# Patient Record
Sex: Female | Born: 1940 | Race: White | Hispanic: No | Smoking: Never smoker
Health system: Southern US, Community
[De-identification: ages and names within clinical notes are randomized; demographics above are authoritative.]

## PROBLEM LIST (undated history)

## (undated) DIAGNOSIS — F338 Other recurrent depressive disorders: Secondary | ICD-10-CM

## (undated) DIAGNOSIS — K589 Irritable bowel syndrome without diarrhea: Secondary | ICD-10-CM

## (undated) DIAGNOSIS — C801 Malignant (primary) neoplasm, unspecified: Secondary | ICD-10-CM

## (undated) DIAGNOSIS — I4891 Unspecified atrial fibrillation: Secondary | ICD-10-CM

## (undated) DIAGNOSIS — J8410 Pulmonary fibrosis, unspecified: Secondary | ICD-10-CM

## (undated) DIAGNOSIS — K219 Gastro-esophageal reflux disease without esophagitis: Secondary | ICD-10-CM

## (undated) DIAGNOSIS — E119 Type 2 diabetes mellitus without complications: Secondary | ICD-10-CM

## (undated) DIAGNOSIS — K529 Noninfective gastroenteritis and colitis, unspecified: Secondary | ICD-10-CM

## (undated) DIAGNOSIS — J849 Interstitial pulmonary disease, unspecified: Secondary | ICD-10-CM

## (undated) DIAGNOSIS — K5792 Diverticulitis of intestine, part unspecified, without perforation or abscess without bleeding: Secondary | ICD-10-CM

## (undated) DIAGNOSIS — I272 Pulmonary hypertension, unspecified: Secondary | ICD-10-CM

## (undated) DIAGNOSIS — IMO0001 Reserved for inherently not codable concepts without codable children: Secondary | ICD-10-CM

## (undated) DIAGNOSIS — I10 Essential (primary) hypertension: Secondary | ICD-10-CM

## (undated) DIAGNOSIS — S72009A Fracture of unspecified part of neck of unspecified femur, initial encounter for closed fracture: Secondary | ICD-10-CM

## (undated) HISTORY — PX: TONSILLECTOMY: SUR1361

## (undated) HISTORY — PX: COLONOSCOPY: SHX174

## (undated) HISTORY — PX: BREAST BIOPSY: SHX20

## (undated) HISTORY — PX: APPENDECTOMY: SHX54

## (undated) HISTORY — PX: ABDOMINAL HYSTERECTOMY: SHX81

## (undated) HISTORY — DX: Type 2 diabetes mellitus without complications: E11.9

---

## 1998-10-01 HISTORY — PX: NISSEN FUNDOPLICATION: SHX2091

## 2011-08-14 DIAGNOSIS — K5790 Diverticulosis of intestine, part unspecified, without perforation or abscess without bleeding: Secondary | ICD-10-CM | POA: Insufficient documentation

## 2012-02-01 DIAGNOSIS — K219 Gastro-esophageal reflux disease without esophagitis: Secondary | ICD-10-CM | POA: Insufficient documentation

## 2012-05-19 DIAGNOSIS — M7070 Other bursitis of hip, unspecified hip: Secondary | ICD-10-CM | POA: Insufficient documentation

## 2012-09-02 DIAGNOSIS — G4733 Obstructive sleep apnea (adult) (pediatric): Secondary | ICD-10-CM | POA: Insufficient documentation

## 2013-01-05 DIAGNOSIS — R682 Dry mouth, unspecified: Secondary | ICD-10-CM | POA: Insufficient documentation

## 2013-01-05 DIAGNOSIS — G4700 Insomnia, unspecified: Secondary | ICD-10-CM | POA: Insufficient documentation

## 2014-04-08 DIAGNOSIS — I6782 Cerebral ischemia: Secondary | ICD-10-CM | POA: Insufficient documentation

## 2014-04-08 DIAGNOSIS — G939 Disorder of brain, unspecified: Secondary | ICD-10-CM | POA: Insufficient documentation

## 2014-11-04 DIAGNOSIS — E119 Type 2 diabetes mellitus without complications: Secondary | ICD-10-CM

## 2014-11-04 DIAGNOSIS — E782 Mixed hyperlipidemia: Secondary | ICD-10-CM | POA: Insufficient documentation

## 2014-11-04 DIAGNOSIS — I10 Essential (primary) hypertension: Secondary | ICD-10-CM | POA: Insufficient documentation

## 2014-11-04 DIAGNOSIS — I152 Hypertension secondary to endocrine disorders: Secondary | ICD-10-CM | POA: Insufficient documentation

## 2014-11-04 HISTORY — DX: Type 2 diabetes mellitus without complications: E11.9

## 2015-01-10 DIAGNOSIS — D509 Iron deficiency anemia, unspecified: Secondary | ICD-10-CM | POA: Insufficient documentation

## 2015-01-11 DIAGNOSIS — E039 Hypothyroidism, unspecified: Secondary | ICD-10-CM | POA: Insufficient documentation

## 2015-03-02 ENCOUNTER — Other Ambulatory Visit: Payer: Self-pay | Admitting: Physician Assistant

## 2015-03-02 DIAGNOSIS — R109 Unspecified abdominal pain: Secondary | ICD-10-CM

## 2015-03-03 ENCOUNTER — Other Ambulatory Visit: Payer: Self-pay | Admitting: Physician Assistant

## 2015-03-03 DIAGNOSIS — R0602 Shortness of breath: Secondary | ICD-10-CM

## 2015-03-04 ENCOUNTER — Ambulatory Visit
Admission: RE | Admit: 2015-03-04 | Discharge: 2015-03-04 | Disposition: A | Discharge status: Home / Self Care | Payer: PPO | Source: Ambulatory Visit | Attending: Physician Assistant | Admitting: Physician Assistant

## 2015-03-04 DIAGNOSIS — K589 Irritable bowel syndrome without diarrhea: Secondary | ICD-10-CM | POA: Diagnosis not present

## 2015-03-04 DIAGNOSIS — M1611 Unilateral primary osteoarthritis, right hip: Secondary | ICD-10-CM | POA: Diagnosis not present

## 2015-03-04 DIAGNOSIS — K5730 Diverticulosis of large intestine without perforation or abscess without bleeding: Secondary | ICD-10-CM | POA: Insufficient documentation

## 2015-03-04 DIAGNOSIS — J8410 Pulmonary fibrosis, unspecified: Secondary | ICD-10-CM | POA: Diagnosis not present

## 2015-03-04 DIAGNOSIS — R109 Unspecified abdominal pain: Secondary | ICD-10-CM | POA: Diagnosis not present

## 2015-03-04 DIAGNOSIS — R0602 Shortness of breath: Secondary | ICD-10-CM

## 2015-03-04 DIAGNOSIS — M4854XA Collapsed vertebra, not elsewhere classified, thoracic region, initial encounter for fracture: Secondary | ICD-10-CM | POA: Insufficient documentation

## 2015-03-04 DIAGNOSIS — I2510 Atherosclerotic heart disease of native coronary artery without angina pectoris: Secondary | ICD-10-CM | POA: Insufficient documentation

## 2015-03-04 HISTORY — DX: Irritable bowel syndrome, unspecified: K58.9

## 2015-03-04 HISTORY — DX: Essential (primary) hypertension: I10

## 2015-03-04 MED ORDER — IOHEXOL 300 MG/ML  SOLN
100.0000 mL | Freq: Once | INTRAMUSCULAR | Status: AC | PRN
  Administered 2015-03-04: 100 mL via INTRAVENOUS

## 2015-03-10 ENCOUNTER — Encounter: Payer: Self-pay | Admitting: *Deleted

## 2015-03-15 NOTE — Discharge Instructions (Signed)
Cataract Surgery Care After Refer to this sheet in the next few weeks. These instructions provide you with information on caring for yourself after your procedure. Your caregiver may also give you more specific instructions. Your treatment has been planned according to current medical practices, but problems sometimes occur. Call your caregiver if you have any problems or questions after your procedure.  HOME CARE INSTRUCTIONS   Avoid strenuous activities as directed by your caregiver.  Ask your caregiver when you can resume driving.  Use eyedrops or other medicines to help healing and control pressure inside your eye as directed by your caregiver.  Only take over-the-counter or prescription medicines for pain, discomfort, or fever as directed by your caregiver.  Do not to touch or rub your eyes.  You may be instructed to use a protective shield during the first few days and nights after surgery. If not, wear sunglasses to protect your eyes. This is to protect the eye from pressure or from being accidentally bumped.  Keep the area around your eye clean and dry. Avoid swimming or allowing water to hit you directly in the face while showering. Keep soap and shampoo out of your eyes.  Do not bend or lift heavy objects. Bending increases pressure in the eye. You can walk, climb stairs, and do light household chores.  Do not put a contact lens into the eye that had surgery until your caregiver says it is okay to do so.  Ask your doctor when you can return to work. This will depend on the kind of work that you do. If you work in a dusty environment, you may be advised to wear protective eyewear for a period of time.  Ask your caregiver when it will be safe to engage in sexual activity.  Continue with your regular eye exams as directed by your caregiver. What to expect:  It is normal to feel itching and mild discomfort for a few days after cataract surgery. Some fluid discharge is also common,  and your eye may be sensitive to light and touch.  After 1 to 2 days, even moderate discomfort should disappear. In most cases, healing will take about 6 weeks.  If you received an intraocular lens (IOL), you may notice that colors are very bright or have a blue tinge. Also, if you have been in bright sunlight, everything may appear reddish for a few hours. If you see these color tinges, it is because your lens is clear and no longer cloudy. Within a few months after receiving an IOL, these extra colors should go away. When you have healed, you will probably need new glasses. SEEK MEDICAL CARE IF:   You have increased bruising around your eye.  You have discomfort not helped by medicine. SEEK IMMEDIATE MEDICAL CARE IF:   You have a fever.  You have a worsening or sudden vision loss.  You have redness, swelling, or increasing pain in the eye.  You have a thick discharge from the eye that had surgery. MAKE SURE YOU:  Understand these instructions.  Will watch your condition.  Will get help right away if you are not doing well or get worse. Document Released: 04/06/2005 Document Revised: 12/10/2011 Document Reviewed: 05/11/2011 [facility] Patient Information 2015 [place], [place]. This information is not intended to replace advice given to you by your health care provider. Make sure you discuss any questions you have with your health care provider. General Anesthesia, Care After Refer to this sheet in the next few weeks.  These instructions provide you with information on caring for yourself after your procedure. Your health care provider may also give you more specific instructions. Your treatment has been planned according to current medical practices, but problems sometimes occur. Call your health care provider if you have any problems or questions after your procedure. WHAT TO EXPECT AFTER THE PROCEDURE After the procedure, it is typical to experience:  Sleepiness.  Nausea and  vomiting. HOME CARE INSTRUCTIONS  For the first 24 hours after general anesthesia:  Have a responsible person with you.  Do not drive a car. If you are alone, do not take public transportation.  Do not drink alcohol.  Do not take medicine that has not been prescribed by your health care provider.  Do not sign important papers or make important decisions.  You may resume a normal diet and activities as directed by your health care provider.  Change bandages (dressings) as directed.  If you have questions or problems that seem related to general anesthesia, call the hospital and ask for the anesthetist or anesthesiologist on call. SEEK MEDICAL CARE IF:  You have nausea and vomiting that continue the day after anesthesia.  You develop a rash. SEEK IMMEDIATE MEDICAL CARE IF:   You have difficulty breathing.  You have chest pain.  You have any allergic problems. Document Released: 12/24/2000 Document Revised: 09/22/2013 Document Reviewed: 04/02/2013 [facility] Patient Information 2015 [place], [place]. This information is not intended to replace advice given to you by your health care provider. Make sure you discuss any questions you have with your health care provider.

## 2015-03-16 ENCOUNTER — Encounter
Admission: RE | Disposition: A | Discharge status: Home / Self Care | Payer: Self-pay | Source: Ambulatory Visit | Attending: Ophthalmology

## 2015-03-16 ENCOUNTER — Ambulatory Visit
Admission: RE | Admit: 2015-03-16 | Discharge: 2015-03-16 | Disposition: A | Discharge status: Home / Self Care | Payer: PPO | Source: Ambulatory Visit | Attending: Ophthalmology | Admitting: Ophthalmology

## 2015-03-16 ENCOUNTER — Ambulatory Visit: Payer: PPO | Admitting: Student in an Organized Health Care Education/Training Program

## 2015-03-16 DIAGNOSIS — E079 Disorder of thyroid, unspecified: Secondary | ICD-10-CM | POA: Diagnosis not present

## 2015-03-16 DIAGNOSIS — K219 Gastro-esophageal reflux disease without esophagitis: Secondary | ICD-10-CM | POA: Insufficient documentation

## 2015-03-16 DIAGNOSIS — Z888 Allergy status to other drugs, medicaments and biological substances status: Secondary | ICD-10-CM | POA: Diagnosis not present

## 2015-03-16 DIAGNOSIS — J40 Bronchitis, not specified as acute or chronic: Secondary | ICD-10-CM | POA: Diagnosis not present

## 2015-03-16 DIAGNOSIS — H2511 Age-related nuclear cataract, right eye: Secondary | ICD-10-CM | POA: Insufficient documentation

## 2015-03-16 DIAGNOSIS — R05 Cough: Secondary | ICD-10-CM | POA: Diagnosis not present

## 2015-03-16 DIAGNOSIS — R062 Wheezing: Secondary | ICD-10-CM | POA: Insufficient documentation

## 2015-03-16 DIAGNOSIS — Z90710 Acquired absence of both cervix and uterus: Secondary | ICD-10-CM | POA: Insufficient documentation

## 2015-03-16 DIAGNOSIS — H9190 Unspecified hearing loss, unspecified ear: Secondary | ICD-10-CM | POA: Diagnosis not present

## 2015-03-16 DIAGNOSIS — M810 Age-related osteoporosis without current pathological fracture: Secondary | ICD-10-CM | POA: Diagnosis not present

## 2015-03-16 DIAGNOSIS — I499 Cardiac arrhythmia, unspecified: Secondary | ICD-10-CM | POA: Insufficient documentation

## 2015-03-16 DIAGNOSIS — D649 Anemia, unspecified: Secondary | ICD-10-CM | POA: Diagnosis not present

## 2015-03-16 DIAGNOSIS — K5790 Diverticulosis of intestine, part unspecified, without perforation or abscess without bleeding: Secondary | ICD-10-CM | POA: Insufficient documentation

## 2015-03-16 DIAGNOSIS — R002 Palpitations: Secondary | ICD-10-CM | POA: Diagnosis not present

## 2015-03-16 DIAGNOSIS — F329 Major depressive disorder, single episode, unspecified: Secondary | ICD-10-CM | POA: Diagnosis not present

## 2015-03-16 HISTORY — DX: Reserved for inherently not codable concepts without codable children: IMO0001

## 2015-03-16 HISTORY — DX: Interstitial pulmonary disease, unspecified: J84.9

## 2015-03-16 HISTORY — PX: CATARACT EXTRACTION W/PHACO: SHX586

## 2015-03-16 HISTORY — DX: Gastro-esophageal reflux disease without esophagitis: K21.9

## 2015-03-16 HISTORY — DX: Other recurrent depressive disorders: F33.8

## 2015-03-16 LAB — GLUCOSE, CAPILLARY: Glucose-Capillary: 100 mg/dL — ABNORMAL HIGH (ref 65–99)

## 2015-03-16 SURGERY — PHACOEMULSIFICATION, CATARACT, WITH IOL INSERTION
Anesthesia: Monitor Anesthesia Care | Laterality: Right | Wound class: Clean

## 2015-03-16 MED ORDER — BRIMONIDINE TARTRATE 0.2 % OP SOLN
OPHTHALMIC | Status: DC | PRN
  Administered 2015-03-16: 1 [drp] via OPHTHALMIC

## 2015-03-16 MED ORDER — POVIDONE-IODINE 5 % OP SOLN
1.0000 "application " | OPHTHALMIC | Status: DC | PRN
  Administered 2015-03-16: 1 via OPHTHALMIC

## 2015-03-16 MED ORDER — FENTANYL CITRATE (PF) 100 MCG/2ML IJ SOLN
INTRAMUSCULAR | Status: DC | PRN
  Administered 2015-03-16: 50 ug via INTRAVENOUS

## 2015-03-16 MED ORDER — ARMC OPHTHALMIC DILATING GEL
1.0000 "application " | OPHTHALMIC | Status: DC | PRN
  Administered 2015-03-16 (×2): 1 via OPHTHALMIC

## 2015-03-16 MED ORDER — TIMOLOL MALEATE 0.5 % OP SOLN
OPHTHALMIC | Status: DC | PRN
  Administered 2015-03-16: 1 [drp] via OPHTHALMIC

## 2015-03-16 MED ORDER — TETRACAINE HCL 0.5 % OP SOLN
1.0000 [drp] | OPHTHALMIC | Status: DC | PRN
  Administered 2015-03-16: 1 [drp] via OPHTHALMIC

## 2015-03-16 MED ORDER — BSS IO SOLN
INTRAOCULAR | Status: DC | PRN
  Administered 2015-03-16: 61 mL via OPHTHALMIC

## 2015-03-16 MED ORDER — CEFUROXIME OPHTHALMIC INJECTION 1 MG/0.1 ML
INJECTION | OPHTHALMIC | Status: DC | PRN
  Administered 2015-03-16: 0.1 mL via OPHTHALMIC

## 2015-03-16 MED ORDER — NA HYALUR & NA CHOND-NA HYALUR 0.4-0.35 ML IO KIT
PACK | INTRAOCULAR | Status: DC | PRN
  Administered 2015-03-16: 1 mL via INTRAOCULAR

## 2015-03-16 SURGICAL SUPPLY — 26 items
CANNULA ANT/CHMB 27GA (MISCELLANEOUS) ×3 IMPLANT
GLOVE SURG LX 7.5 STRW (GLOVE) ×2
GLOVE SURG LX STRL 7.5 STRW (GLOVE) ×1 IMPLANT
GLOVE SURG TRIUMPH 8.0 PF LTX (GLOVE) ×3 IMPLANT
GOWN STRL REUS W/ TWL LRG LVL3 (GOWN DISPOSABLE) ×2 IMPLANT
GOWN STRL REUS W/TWL LRG LVL3 (GOWN DISPOSABLE) ×4
LENS IOL TECNIS 22.5 (Intraocular Lens) ×3 IMPLANT
LENS IOL TECNIS MONO 1P 22.5 (Intraocular Lens) ×1 IMPLANT
MARKER SKIN SURG W/RULER VIO (MISCELLANEOUS) ×3 IMPLANT
NDL RETROBULBAR .5 NSTRL (NEEDLE) IMPLANT
NEEDLE FILTER BLUNT 18X 1/2SAF (NEEDLE) ×2
NEEDLE FILTER BLUNT 18X1 1/2 (NEEDLE) ×1 IMPLANT
PACK CATARACT BRASINGTON (MISCELLANEOUS) ×3 IMPLANT
PACK EYE AFTER SURG (MISCELLANEOUS) ×3 IMPLANT
PACK OPTHALMIC (MISCELLANEOUS) ×3 IMPLANT
RING MALYGIN 7.0 (MISCELLANEOUS) IMPLANT
SUT ETHILON 10-0 CS-B-6CS-B-6 (SUTURE)
SUT VICRYL  9 0 (SUTURE)
SUT VICRYL 9 0 (SUTURE) IMPLANT
SUTURE EHLN 10-0 CS-B-6CS-B-6 (SUTURE) IMPLANT
SYR 3ML LL SCALE MARK (SYRINGE) ×3 IMPLANT
SYR 5ML LL (SYRINGE) IMPLANT
SYR TB 1ML LUER SLIP (SYRINGE) ×3 IMPLANT
WATER STERILE IRR 250ML POUR (IV SOLUTION) ×3 IMPLANT
WATER STERILE IRR 500ML POUR (IV SOLUTION) IMPLANT
WIPE NON LINTING 3.25X3.25 (MISCELLANEOUS) ×3 IMPLANT

## 2015-03-16 NOTE — H&P (Signed)
  The History and Physical notes were scanned in.  The patient remains stable and unchanged from the H&P.   Previous H&P reviewed, patient examined, and there are no changes.  Julie Mcmillan 03/16/2015 10:14 AM

## 2015-03-16 NOTE — Anesthesia Postprocedure Evaluation (Signed)
  Anesthesia Post-op Note  Patient: Julie Mcmillan  Procedure(s) Performed: Procedure(s): CATARACT EXTRACTION PHACO AND INTRAOCULAR LENS PLACEMENT (IOC) (Right)  Anesthesia type:MAC  Patient location: PACU  Post pain: Pain level controlled  Post assessment: Post-op Vital signs reviewed, Patient's Cardiovascular Status Stable, Respiratory Function Stable, Patent Airway and No signs of Nausea or vomiting  Post vital signs: Reviewed and stable  Last Vitals:  Filed Vitals:   03/16/15 1106  BP: 139/68  Pulse: 78  Temp: 36.4 C  Resp: 15    Level of consciousness: awake, alert  and patient cooperative  Complications: No apparent anesthesia complications

## 2015-03-16 NOTE — Anesthesia Procedure Notes (Signed)
Procedure Name: MAC Performed by: Londell Moh Pre-anesthesia Checklist: Patient identified, Emergency Drugs available, Suction available, Timeout performed and Patient being monitored Patient Re-evaluated:Patient Re-evaluated prior to inductionOxygen Delivery Method: Nasal cannula Placement Confirmation: positive ETCO2

## 2015-03-16 NOTE — Op Note (Signed)
LOCATION:  [facility]   PREOPERATIVE DIAGNOSIS:    Nuclear sclerotic cataract right eye. H25.11   POSTOPERATIVE DIAGNOSIS:  Nuclear sclerotic cataract right eye.     PROCEDURE:  Phacoemusification with posterior chamber intraocular lens placement of the right eye   LENS:   Implant Name Type Inv. Item Serial No. Manufacturer Lot No. LRB No. Used  LENS IMPL INTRAOC [id number] 22.5 - [id number] Intraocular Lens LENS IMPL INTRAOC ZCB00 22.5 [id number]   Right 1        ULTRASOUND TIME: 16 % of 1 minutes, 8 seconds.  CDE 11.2   SURGEON:  Wyonia Hough, MD   ANESTHESIA:  Topical with tetracaine drops and 2% Xylocaine jelly.   COMPLICATIONS:  None.   DESCRIPTION OF PROCEDURE:  The patient was identified in the holding room and transported to the operating room and placed in the supine position under the operating microscope.  The right eye was identified as the operative eye and it was prepped and draped in the usual sterile ophthalmic fashion.   A 1 millimeter clear-corneal paracentesis was made at the 12:00 position.  The anterior chamber was filled with Viscoat viscoelastic.  A 2.4 millimeter keratome was used to make a near-clear corneal incision at the 9:00 position.  A curvilinear capsulorrhexis was made with a cystotome and capsulorrhexis forceps.  Balanced salt solution was used to hydrodissect and hydrodelineate the nucleus.   Phacoemulsification was then used in stop and chop fashion to remove the lens nucleus and epinucleus.  The remaining cortex was then removed using the irrigation and aspiration handpiece. Provisc was then placed into the capsular bag to distend it for lens placement.  A lens was then injected into the capsular bag.  The remaining viscoelastic was aspirated.   Wounds were hydrated with balanced salt solution.  The anterior chamber was inflated to a physiologic pressure with balanced salt solution.  No wound leaks were noted. Cefuroxime 0.1 ml of a  38m/ml solution was injected into the anterior chamber for a dose of 1 mg of intracameral antibiotic at the completion of the case.   Timolol and Brimonidine drops were applied to the eye.  The patient was taken to the recovery room in stable condition without complications of anesthesia or surgery.   Monetta Lick 03/16/2015, 11:03 AM

## 2015-03-16 NOTE — Anesthesia Preprocedure Evaluation (Signed)
Anesthesia Evaluation  Patient identified by MRN, date of birth, ID band Patient awake    Reviewed: Allergy & Precautions, NPO status , Patient's Chart, lab work & pertinent test results  Airway Mallampati: III  TM Distance: >3 FB Neck ROM: Full    Dental no notable dental hx.    Pulmonary neg pulmonary ROS,  Interstitial lung disease breath sounds clear to auscultation  Pulmonary exam normal       Cardiovascular METS: 3 - Mets hypertension, negative cardio ROS Normal cardiovascular examRhythm:Regular Rate:Normal  H/o arrhythmia.  No current symptoms.  Pt can lie flat   Neuro/Psych negative neurological ROS  negative psych ROS   GI/Hepatic negative GI ROS, Neg liver ROS, GERD-  ,  Endo/Other  negative endocrine ROS  Renal/GU negative Renal ROS  negative genitourinary   Musculoskeletal negative musculoskeletal ROS (+)   Abdominal   Peds negative pediatric ROS (+)  Hematology negative hematology ROS (+)   Anesthesia Other Findings   Reproductive/Obstetrics negative OB ROS                             Anesthesia Physical Anesthesia Plan  ASA: II  Anesthesia Plan: MAC   Post-op Pain Management:    Induction: Intravenous  Airway Management Planned:   Additional Equipment:   Intra-op Plan:   Post-operative Plan: Extubation in OR  Informed Consent: I have reviewed the patients History and Physical, chart, labs and discussed the procedure including the risks, benefits and alternatives for the proposed anesthesia with the patient or authorized representative who has indicated his/her understanding and acceptance.   Dental advisory given  Plan Discussed with: CRNA  Anesthesia Plan Comments:         Anesthesia Quick Evaluation

## 2015-03-16 NOTE — Transfer of Care (Signed)
Immediate Anesthesia Transfer of Care Note  Patient: Julie Mcmillan  Procedure(s) Performed: Procedure(s): CATARACT EXTRACTION PHACO AND INTRAOCULAR LENS PLACEMENT (IOC) (Right)  Patient Location: PACU  Anesthesia Type: MAC  Level of Consciousness: awake, alert  and patient cooperative  Airway and Oxygen Therapy: Patient Spontanous Breathing and Patient connected to supplemental oxygen  Post-op Assessment: Post-op Vital signs reviewed, Patient's Cardiovascular Status Stable, Respiratory Function Stable, Patent Airway and No signs of Nausea or vomiting  Post-op Vital Signs: Reviewed and stable  Complications: No apparent anesthesia complications

## 2015-03-17 ENCOUNTER — Encounter: Payer: Self-pay | Admitting: Ophthalmology

## 2015-03-25 ENCOUNTER — Observation Stay
Admission: EM | Admit: 2015-03-25 | Discharge: 2015-03-29 | Disposition: A | Discharge status: Home / Self Care | Payer: PPO | Attending: Internal Medicine | Admitting: Internal Medicine

## 2015-03-25 ENCOUNTER — Emergency Department: Payer: PPO

## 2015-03-25 DIAGNOSIS — Z833 Family history of diabetes mellitus: Secondary | ICD-10-CM | POA: Diagnosis not present

## 2015-03-25 DIAGNOSIS — R197 Diarrhea, unspecified: Secondary | ICD-10-CM | POA: Insufficient documentation

## 2015-03-25 DIAGNOSIS — E119 Type 2 diabetes mellitus without complications: Secondary | ICD-10-CM | POA: Insufficient documentation

## 2015-03-25 DIAGNOSIS — K5730 Diverticulosis of large intestine without perforation or abscess without bleeding: Secondary | ICD-10-CM | POA: Diagnosis not present

## 2015-03-25 DIAGNOSIS — Z8249 Family history of ischemic heart disease and other diseases of the circulatory system: Secondary | ICD-10-CM | POA: Insufficient documentation

## 2015-03-25 DIAGNOSIS — K219 Gastro-esophageal reflux disease without esophagitis: Secondary | ICD-10-CM | POA: Diagnosis not present

## 2015-03-25 DIAGNOSIS — F419 Anxiety disorder, unspecified: Secondary | ICD-10-CM | POA: Insufficient documentation

## 2015-03-25 DIAGNOSIS — Z7902 Long term (current) use of antithrombotics/antiplatelets: Secondary | ICD-10-CM | POA: Insufficient documentation

## 2015-03-25 DIAGNOSIS — I480 Paroxysmal atrial fibrillation: Secondary | ICD-10-CM | POA: Insufficient documentation

## 2015-03-25 DIAGNOSIS — I272 Other secondary pulmonary hypertension: Secondary | ICD-10-CM | POA: Insufficient documentation

## 2015-03-25 DIAGNOSIS — Z23 Encounter for immunization: Secondary | ICD-10-CM | POA: Insufficient documentation

## 2015-03-25 DIAGNOSIS — K921 Melena: Secondary | ICD-10-CM | POA: Insufficient documentation

## 2015-03-25 DIAGNOSIS — J849 Interstitial pulmonary disease, unspecified: Secondary | ICD-10-CM | POA: Insufficient documentation

## 2015-03-25 DIAGNOSIS — Z79899 Other long term (current) drug therapy: Secondary | ICD-10-CM | POA: Insufficient documentation

## 2015-03-25 DIAGNOSIS — I10 Essential (primary) hypertension: Secondary | ICD-10-CM | POA: Diagnosis not present

## 2015-03-25 DIAGNOSIS — Z803 Family history of malignant neoplasm of breast: Secondary | ICD-10-CM | POA: Insufficient documentation

## 2015-03-25 DIAGNOSIS — E039 Hypothyroidism, unspecified: Secondary | ICD-10-CM | POA: Insufficient documentation

## 2015-03-25 DIAGNOSIS — M810 Age-related osteoporosis without current pathological fracture: Secondary | ICD-10-CM | POA: Insufficient documentation

## 2015-03-25 DIAGNOSIS — J189 Pneumonia, unspecified organism: Secondary | ICD-10-CM | POA: Diagnosis present

## 2015-03-25 DIAGNOSIS — M1990 Unspecified osteoarthritis, unspecified site: Secondary | ICD-10-CM | POA: Diagnosis not present

## 2015-03-25 DIAGNOSIS — I4891 Unspecified atrial fibrillation: Secondary | ICD-10-CM | POA: Diagnosis present

## 2015-03-25 DIAGNOSIS — Z86010 Personal history of colonic polyps: Secondary | ICD-10-CM | POA: Diagnosis not present

## 2015-03-25 LAB — CBC
HCT: 40 % (ref 35.0–47.0)
HEMOGLOBIN: 13 g/dL (ref 12.0–16.0)
MCH: 27 pg (ref 26.0–34.0)
MCHC: 32.4 g/dL (ref 32.0–36.0)
MCV: 83.2 fL (ref 80.0–100.0)
Platelets: 407 10*3/uL (ref 150–440)
RBC: 4.81 MIL/uL (ref 3.80–5.20)
RDW: 14.7 % — ABNORMAL HIGH (ref 11.5–14.5)
WBC: 17.5 10*3/uL — ABNORMAL HIGH (ref 3.6–11.0)

## 2015-03-25 LAB — PROTIME-INR
INR: 1.46
Prothrombin Time: 17.9 seconds — ABNORMAL HIGH (ref 11.4–15.0)

## 2015-03-25 LAB — TROPONIN I
Troponin I: <0.03 ng/mL (ref <0.031)
Troponin I: <0.03 ng/mL (ref <0.031)
Troponin I: <0.03 ng/mL (ref <0.031)

## 2015-03-25 LAB — COMPREHENSIVE METABOLIC PANEL
ALT: 15 U/L (ref 14–54)
AST: 24 U/L (ref 15–41)
Albumin: 4 g/dL (ref 3.5–5.0)
Alkaline Phosphatase: 88 U/L (ref 38–126)
Anion gap: 10 (ref 5–15)
BUN: 10 mg/dL (ref 6–20)
CALCIUM: 8.9 mg/dL (ref 8.9–10.3)
CO2: 23 mmol/L (ref 22–32)
CREATININE: 0.9 mg/dL (ref 0.44–1.00)
Chloride: 105 mmol/L (ref 101–111)
GFR calc Af Amer: >60 mL/min (ref >60)
Glucose, Bld: 119 mg/dL — ABNORMAL HIGH (ref 65–99)
Potassium: 3.7 mmol/L (ref 3.5–5.1)
Sodium: 138 mmol/L (ref 135–145)
Total Bilirubin: 0.5 mg/dL (ref 0.3–1.2)
Total Protein: 7.3 g/dL (ref 6.5–8.1)

## 2015-03-25 LAB — BRAIN NATRIURETIC PEPTIDE: B Natriuretic Peptide: 332 pg/mL — ABNORMAL HIGH (ref 0.0–100.0)

## 2015-03-25 LAB — APTT: aPTT: 37 seconds — ABNORMAL HIGH (ref 24–36)

## 2015-03-25 LAB — TSH: TSH: 6.046 u[IU]/mL — ABNORMAL HIGH (ref 0.350–4.500)

## 2015-03-25 MED ORDER — METFORMIN HCL 500 MG PO TABS
500.0000 mg | ORAL_TABLET | Freq: Two times a day (BID) | ORAL | Status: DC
  Administered 2015-03-26 – 2015-03-29 (×8): 500 mg via ORAL
  Filled 2015-03-25 (×8): qty 1

## 2015-03-25 MED ORDER — DILTIAZEM HCL 25 MG/5ML IV SOLN
10.0000 mg | Freq: Once | INTRAVENOUS | Status: DC
  Administered 2015-03-25: 10 mg via INTRAVENOUS

## 2015-03-25 MED ORDER — VENLAFAXINE HCL ER 37.5 MG PO CP24
37.5000 mg | ORAL_CAPSULE | Freq: Every day | ORAL | Status: DC
  Administered 2015-03-26 – 2015-03-29 (×4): 37.5 mg via ORAL
  Filled 2015-03-25 (×4): qty 1

## 2015-03-25 MED ORDER — VENLAFAXINE HCL 37.5 MG PO TABS: 100.0000 mg | ORAL_TABLET | Freq: Every day | ORAL | Status: DC

## 2015-03-25 MED ORDER — LEVOTHYROXINE SODIUM 150 MCG PO TABS
150.0000 ug | ORAL_TABLET | Freq: Every day | ORAL | Status: DC
Start: 2015-03-26 — End: 2015-03-29
  Administered 2015-03-26 – 2015-03-29 (×4): 150 ug via ORAL
  Filled 2015-03-25 (×4): qty 1

## 2015-03-25 MED ORDER — DILTIAZEM HCL 30 MG PO TABS
ORAL_TABLET | ORAL | Status: AC
  Administered 2015-03-25: 30 mg via ORAL
  Filled 2015-03-25: qty 1

## 2015-03-25 MED ORDER — DILTIAZEM HCL 25 MG/5ML IV SOLN
INTRAVENOUS | Status: AC
  Filled 2015-03-25: qty 5

## 2015-03-25 MED ORDER — SODIUM CHLORIDE 0.9 % IJ SOLN
3.0000 mL | Freq: Two times a day (BID) | INTRAMUSCULAR | Status: DC
  Administered 2015-03-25 – 2015-03-29 (×7): 3 mL via INTRAVENOUS

## 2015-03-25 MED ORDER — DILTIAZEM HCL 25 MG/5ML IV SOLN
10.0000 mg | Freq: Once | INTRAVENOUS | Status: AC
  Administered 2015-03-25: 10 mg via INTRAVENOUS

## 2015-03-25 MED ORDER — ADULT MULTIVITAMIN W/MINERALS CH
1.0000 | ORAL_TABLET | Freq: Every day | ORAL | Status: DC
  Administered 2015-03-25 – 2015-03-29 (×5): 1 via ORAL
  Filled 2015-03-25 (×9): qty 1

## 2015-03-25 MED ORDER — LEVOFLOXACIN IN D5W 750 MG/150ML IV SOLN
750.0000 mg | Freq: Once | INTRAVENOUS | Status: AC
  Administered 2015-03-25: 750 mg via INTRAVENOUS

## 2015-03-25 MED ORDER — PANTOPRAZOLE SODIUM 40 MG PO TBEC
40.0000 mg | DELAYED_RELEASE_TABLET | Freq: Every day | ORAL | Status: DC
  Administered 2015-03-25 – 2015-03-29 (×5): 40 mg via ORAL
  Filled 2015-03-25 (×5): qty 1

## 2015-03-25 MED ORDER — LOSARTAN POTASSIUM 50 MG PO TABS
100.0000 mg | ORAL_TABLET | Freq: Every day | ORAL | Status: DC
  Administered 2015-03-25 – 2015-03-29 (×5): 100 mg via ORAL
  Filled 2015-03-25 (×4): qty 2

## 2015-03-25 MED ORDER — DILTIAZEM HCL 25 MG/5ML IV SOLN: 20.0000 mg | Freq: Once | INTRAVENOUS | Status: DC

## 2015-03-25 MED ORDER — LEVOFLOXACIN IN D5W 750 MG/150ML IV SOLN
INTRAVENOUS | Status: AC
  Filled 2015-03-25: qty 150

## 2015-03-25 MED ORDER — ACETAMINOPHEN 325 MG PO TABS
650.0000 mg | ORAL_TABLET | Freq: Four times a day (QID) | ORAL | Status: DC | PRN
  Administered 2015-03-25 – 2015-03-27 (×4): 650 mg via ORAL
  Filled 2015-03-25 (×4): qty 2

## 2015-03-25 MED ORDER — VENLAFAXINE HCL ER 37.5 MG PO CP24
37.5000 mg | ORAL_CAPSULE | Freq: Every day | ORAL | Status: DC
  Filled 2015-03-25 (×2): qty 1

## 2015-03-25 MED ORDER — HYDRALAZINE HCL 25 MG PO TABS
25.0000 mg | ORAL_TABLET | Freq: Three times a day (TID) | ORAL | Status: DC
  Administered 2015-03-25 – 2015-03-29 (×12): 25 mg via ORAL
  Filled 2015-03-25 (×12): qty 1

## 2015-03-25 MED ORDER — LEVOFLOXACIN 500 MG PO TABS
500.0000 mg | ORAL_TABLET | Freq: Every day | ORAL | Status: DC
  Administered 2015-03-25 – 2015-03-29 (×5): 500 mg via ORAL
  Filled 2015-03-25 (×5): qty 1

## 2015-03-25 MED ORDER — DILTIAZEM HCL 30 MG PO TABS
30.0000 mg | ORAL_TABLET | Freq: Once | ORAL | Status: AC
  Administered 2015-03-25: 30 mg via ORAL

## 2015-03-25 MED ORDER — METOPROLOL SUCCINATE ER 100 MG PO TB24
100.0000 mg | ORAL_TABLET | Freq: Every day | ORAL | Status: DC
  Administered 2015-03-25 – 2015-03-26 (×2): 100 mg via ORAL
  Filled 2015-03-25 (×2): qty 1

## 2015-03-25 MED ORDER — VENLAFAXINE HCL ER 75 MG PO CP24
150.0000 mg | ORAL_CAPSULE | Freq: Every day | ORAL | Status: DC
  Administered 2015-03-26 – 2015-03-29 (×4): 150 mg via ORAL
  Filled 2015-03-25 (×4): qty 2

## 2015-03-25 NOTE — H&P (Signed)
[facility] NAME: Julie Mcmillan    MR#:  [id number]  DATE OF BIRTH:  March 29, 1941  DATE OF ADMISSION:  03/25/2015  PRIMARY CARE PHYSICIAN: Glendon Axe, MD   REQUESTING/REFERRING PHYSICIAN: Williams  CHIEF COMPLAINT:   Chief Complaint  Patient presents with  . Tachycardia    HISTORY OF PRESENT ILLNESS: Julie Mcmillan  is a 74 y.o. female with a known history of hypertension, anxiety, osteoporosis, hypothyroidism, gastroesophageal reflux disease, impaired glucose tolerance, interstitial lung disease- mode from [facility] to [place], [place] 6 months ago. Has chronic and related to episodes of diverticulitis, she just finished 10 days course of Cipro floxacillin and Flagyl 5 days ago. After finishing up the course still have some bloating and had black tarry stool 1 time 5 days ago. For last 1 week she also has been getting more short of breath while walking minimal distances and feeling dizzy almost every time when trying to stand up. She will also have cough with some greenish sputum production. She had a cataract surgery done a week ago, and today had to go for a follow-up, while she was in a store this morning could not even walk from car to the store and felt extremely short of breath so she called her son to bring her to the emergency room.  In ER she was noted to have a heart rate up to 140 and 150 with A. fib, given 2 injections of Cardizem with conversion to normal sinus rhythm. On x-ray chest noted to have some infiltrate on the left lower lobe.  As per her, when she was living in [facility] of irregular heart beat she was sent to a cardiologist, and had a Holter monitor, finally was prescribed with xarelto. She is still taking it.  PAST MEDICAL HISTORY:   Past Medical History  Diagnosis Date  . Irregular heart beat   . Hypertension   . IBS (irritable bowel syndrome)   . Shortness of breath dyspnea   . GERD  (gastroesophageal reflux disease)   . Arthritis   . Interstitial lung disease   . Seasonal affective disorder     PAST SURGICAL HISTORY:  Past Surgical History  Procedure Laterality Date  . Nissen fundoplication    . Abdominal hysterectomy      partial  . Appendectomy    . Tonsillectomy      and adenoidectomy  . Colonoscopy    . Cardiac catheterization      "20 yrs ago" - all ok  . Cataract extraction w/phaco Right 03/16/2015    Procedure: CATARACT EXTRACTION PHACO AND INTRAOCULAR LENS PLACEMENT (IOC);  Surgeon: Leandrew Koyanagi, MD;  Location: [facility];  Service: Ophthalmology;  Laterality: Right;    SOCIAL HISTORY:  History  Substance Use Topics  . Smoking status: Never Smoker   . Smokeless tobacco: Not on file  . Alcohol Use: No    FAMILY HISTORY:  Family History  Problem Relation Age of Onset  . Breast cancer Mother   . Diabetes Mellitus II Mother   . Hypothyroidism Mother   . Atrial fibrillation Mother   . Heart attack Father     DRUG ALLERGIES:  Allergies  Allergen Reactions  . Amlodipine Swelling  . Nexium [Esomeprazole Magnesium] Diarrhea    REVIEW OF SYSTEMS:   CONSTITUTIONAL: No fever, fatigue or weakness.  EYES: No blurred or double vision.  EARS, NOSE, AND THROAT: No tinnitus or ear pain.  RESPIRATORY: positive for cough &  shortness of breath, no wheezing or hemoptysis.  CARDIOVASCULAR: No chest pain, orthopnea, edema. Some palpitation. GASTROINTESTINAL: No nausea, vomiting, had diarrhea and mild abdominal pain. Noticed black stool once.  GENITOURINARY: No dysuria, hematuria.  ENDOCRINE: No polyuria, nocturia,  HEMATOLOGY: No anemia, easy bruising or bleeding SKIN: No rash or lesion. MUSCULOSKELETAL: No joint pain or arthritis.   NEUROLOGIC: No tingling, numbness, weakness.  PSYCHIATRY: No anxiety or depression.   MEDICATIONS AT HOME:  Prior to Admission medications   Medication Sig Start Date End Date Taking? Authorizing  Provider  acetaminophen (TYLENOL) 325 MG tablet Take 650 mg by mouth as needed.   Yes Historical Provider, MD  CALCIUM PO Take by mouth.   Yes Historical Provider, MD  ferrous fumarate (HEMOCYTE - 106 MG FE) 325 (106 FE) MG TABS tablet Take 1 tablet by mouth daily. Lunchtime   Yes Historical Provider, MD  hydrALAZINE (APRESOLINE) 25 MG tablet Take 25 mg by mouth 3 (three) times daily.   Yes Historical Provider, MD  lansoprazole (PREVACID) 15 MG capsule Take 1 capsule by mouth daily. 12/11/12  Yes Historical Provider, MD  levothyroxine (SYNTHROID, LEVOTHROID) 150 MCG tablet Take 150 mcg by mouth daily before breakfast.   Yes Historical Provider, MD  losartan (COZAAR) 100 MG tablet Take 100 mg by mouth daily. Lunchtime   Yes Historical Provider, MD  metFORMIN (GLUCOPHAGE) 500 MG tablet Take by mouth 2 (two) times daily with a meal.   Yes Historical Provider, MD  metoprolol succinate (TOPROL-XL) 100 MG 24 hr tablet Take 100 mg by mouth daily. AM   Yes Historical Provider, MD  Multiple Vitamin (MULTI-VITAMIN DAILY PO) Take 1 tablet by mouth daily.   Yes Historical Provider, MD  omeprazole (PRILOSEC) 20 MG capsule Take 20 mg by mouth daily. AM   Yes Historical Provider, MD  rivaroxaban (XARELTO) 20 MG TABS tablet Take 20 mg by mouth daily with supper.   Yes Historical Provider, MD  venlafaxine (EFFEXOR) 100 MG tablet Take 100 mg by mouth daily. Lunchtime   Yes Historical Provider, MD  venlafaxine (EFFEXOR) 37.5 MG tablet Take 37.5 mg by mouth daily. Lunchtime   Yes Historical Provider, MD      PHYSICAL EXAMINATION:   VITAL SIGNS: Blood pressure 128/58, pulse 69, temperature 97.8 F (36.6 C), temperature source Oral, resp. rate 20, height _0  (1.626 m), weight 68.04 kg (150 lb), SpO2 96 %.  GENERAL:  74 y.o.-year-old patient lying in the bed with no acute distress.  EYES: Pupils equal, round, reactive to light and accommodation. No scleral icterus. Extraocular muscles intact.  HEENT: Head  atraumatic, normocephalic. Oropharynx and nasopharynx clear.  NECK:  Supple, no jugular venous distention. No thyroid enlargement, no tenderness.  LUNGS: Normal breath sounds bilaterally, no wheezing, rales,rhonchi or crepitation. No use of accessory muscles of respiration.  CARDIOVASCULAR: S1, S2 normal. No murmurs, rubs, or gallops.  ABDOMEN: Soft, mild tender in right lower quadrent , nondistended. Bowel sounds present. No organomegaly or mass.  EXTREMITIES: No pedal edema, cyanosis, or clubbing.  NEUROLOGIC: Cranial nerves II through XII are intact. Muscle strength 5/5 in all extremities. Sensation intact. Gait not checked.  PSYCHIATRIC: The patient is alert and oriented x 3.  SKIN: No obvious rash, lesion, or ulcer.   LABORATORY PANEL:   CBC  Recent Labs Lab 03/25/15 1450  WBC 17.5*  HGB 13.0  HCT 40.0  PLT 407  MCV 83.2  MCH 27.0  MCHC 32.4  RDW 14.7*   ------------------------------------------------------------------------------------------------------------------  Chemistries  Recent Labs Lab 03/25/15 1450  NA 138  K 3.7  CL 105  CO2 23  GLUCOSE 119*  BUN 10  CREATININE 0.90  CALCIUM 8.9  AST 24  ALT 15  ALKPHOS 88  BILITOT 0.5   ------------------------------------------------------------------------------------------------------------------ estimated creatinine clearance is 51.2 mL/min (by C-G formula based on Cr of 0.9). ------------------------------------------------------------------------------------------------------------------ No results for input(s): TSH, T4TOTAL, T3FREE, THYROIDAB in the last 72 hours.  Invalid input(s): FREET3   Coagulation profile  Recent Labs Lab 03/25/15 1450  INR 1.46    Cardiac Enzymes  Recent Labs Lab 03/25/15 1450  TROPONINI <0.03   ------------------------------------------------------------------------------------------------------------------ Invalid input(s):  POCBNP  ---------------------------------------------------------------------------------------------------------------  Urinalysis No results found for: COLORURINE, APPEARANCEUR, LABSPEC, PHURINE, GLUCOSEU, HGBUR, BILIRUBINUR, KETONESUR, PROTEINUR, UROBILINOGEN, NITRITE, LEUKOCYTESUR   RADIOLOGY: Dg Chest Port 1 View  03/25/2015   CLINICAL DATA:  Shortness of breath since Monday.  Nonsmoker.  EXAM: PORTABLE CHEST - 1 VIEW  COMPARISON:  Chest CT 03/04/2015  FINDINGS: There are emphysematous changes throughout the lungs. Asymmetric density is identified in the left lung base, raising the question of early infiltrate. Heart size is accentuated by the technique.  IMPRESSION: 1. Emphysematous changes. 2. Asymmetric density in the left lung raising the question of early infiltrate.   Electronically Signed   By: Nolon Nations M.D.   On: 03/25/2015 15:48    EKG: A fib with ventricular rate 150 on arrival, now in NSR- rate 70.  IMPRESSION AND PLAN:  *  Atrial fibrillation with rapid ventricular response  Responded to 2 Cardizem injection by ER, will monitor on telemetry.   We will check serial troponin, TSH, and get cardiology consult.   She is already taking Xarelto- I will hold it has she had black stool a few days ago and having GI issues.  * Black stool and diarrhea  Last stool was 2 days ago, patient has been recently treated for diverticulitis with 10 days' course of ciprofloxacin and Flagyl. Currently I will hold her Xarelto, and call GI consult. If she starts having diarrhea again I will prefer to send stool sample for C. Difficile and culture.  * Diabetes  She takes metformin will continue that  * Hypothyroidism   She is on levothyroxine and continue that. Check TSH.   * GERD  Continue PPI.  * Pneumonia  We will give Levaquin for now.  All the records are reviewed and case discussed with ED provider. Management plans discussed with the patient, family and they are in  agreement.  CODE STATUS: FUll    TOTAL TIME TAKING CARE OF THIS PATIENT: 50 minutes.    Vaughan Basta M.D on 03/25/2015   Between 7am to 6pm - Pager - [phone]  After 6pm go to [email or web address] - password [email or web address]  Office  [phone]  CC: Primary care physician; Glendon Axe, MD

## 2015-03-25 NOTE — ED Notes (Signed)
Patient resting in stretcher. Respirations even and unlabored. No obvious distress. Cardiac monitor in place. No needs/concerns verbalized at this time. Call bell within reach. Encouraged to call with needs. Will continue to monitor.

## 2015-03-25 NOTE — ED Provider Notes (Signed)
[facility] Emergency Department Provider Note     Time seen: ----------------------------------------- 2:47 PM on 03/25/2015 -----------------------------------------    I have reviewed the triage vital signs and the nursing notes.   HISTORY  Chief Complaint Tachycardia    HPI Julie Mcmillan is a 74 y.o. female present ER with increasing dyspnea over the last several days. Patient states she was so short of breath she couldn't even walk into the supermarket about sugar today. Patient also reports recent black tarry stools, she has not ever had black stools or passed blood in her stools. Patient currently takes Xarelto for atrial fibrillation, has had diarrhea daily for the last several days, she denies fevers or chills, denies any chest pain. She was unaware that her heart was racing, only has dyspnea. It is moderate to severe, worse with activity   Past Medical History  Diagnosis Date  . Irregular heart beat   . Hypertension   . IBS (irritable bowel syndrome)   . Shortness of breath dyspnea   . GERD (gastroesophageal reflux disease)   . Arthritis   . Interstitial lung disease   . Seasonal affective disorder     There are no active problems to display for this patient.   Past Surgical History  Procedure Laterality Date  . Nissen fundoplication    . Abdominal hysterectomy      partial  . Appendectomy    . Tonsillectomy      and adenoidectomy  . Colonoscopy    . Cardiac catheterization      "20 yrs ago" - all ok  . Cataract extraction w/phaco Right 03/16/2015    Procedure: CATARACT EXTRACTION PHACO AND INTRAOCULAR LENS PLACEMENT (IOC);  Surgeon: Leandrew Koyanagi, MD;  Location: [place];  Service: Ophthalmology;  Laterality: Right;    Allergies Nexium  Social History History  Substance Use Topics  . Smoking status: Never Smoker   . Smokeless tobacco: Not on file  . Alcohol Use: No    Review of  Systems Constitutional: Negative for fever. Eyes: Negative for visual changes. ENT: Negative for sore throat. Cardiovascular: Negative for chest pain. Respiratory: Positive for shortness of breath Gastrointestinal: Negative for abdominal pain, vomiting and diarrhea. Positive for black stools Genitourinary: Negative for dysuria. Musculoskeletal: Negative for back pain. Skin: Negative for rash. Neurological: Negative for headaches, positive for weakness  10-point ROS otherwise negative.  ____________________________________________   PHYSICAL EXAM:  VITAL SIGNS: ED Triage Vitals  Enc Vitals Group     BP 03/25/15 1443 138/95 mmHg     Pulse Rate 03/25/15 1443 148     Resp 03/25/15 1443 20     Temp 03/25/15 1443 97.8 F (36.6 C)     Temp Source 03/25/15 1443 Oral     SpO2 03/25/15 1443 96 %     Weight --      Height --      Head Cir --      Peak Flow --      Pain Score --      Pain Loc --      Pain Edu? --      Excl. in [place]? --     Constitutional: Alert and oriented. Mild distress Eyes: Pale conjunctiva PERRL. Normal extraocular movements. ENT   Head: Normocephalic and atraumatic.   Nose: No congestion/rhinnorhea.   Mouth/Throat: Mucous membranes are moist.   Neck: No stridor. Hematological/Lymphatic/Immunilogical: No cervical lymphadenopathy. Cardiovascular: Rapid rate irregular rhythm. Normal and symmetric distal pulses are present in  all extremities. No murmurs, rubs, or gallops. Respiratory: Normal respiratory effort without tachypnea nor retractions. Breath sounds are clear and equal bilaterally. No wheezes/rales/rhonchi. Gastrointestinal: Soft and nontender. No distention. No abdominal bruits. There is no CVA tenderness. Musculoskeletal: Nontender with normal range of motion in all extremities. No joint effusions.  No lower extremity tenderness nor edema. Neurologic:  Normal speech and language. No gross focal neurologic deficits are appreciated. Speech  is normal. No gait instability. Skin:  Pale skin, no rash. Psychiatric: Mood and affect are normal. Speech and behavior are normal. Patient exhibits appropriate insight and judgment. ____________________________________________  EKG: Interpreted by me. Atrial flutter with variable AV block, rate is 146, normal QRS with, LVH with re-pole. No evidence of acute infarction.  ____________________________________________  ED COURSE:  Pertinent labs & imaging results that were available during my care of the patient were reviewed by me and considered in my medical decision making (see chart for details). Patient receiving IV fluid and IV Cardizem. ____________________________________________    LABS (pertinent positives/negatives)  Labs Reviewed  CBC - Abnormal; Notable for the following:    WBC 17.5 (*)    RDW 14.7 (*)    All other components within normal limits  COMPREHENSIVE METABOLIC PANEL - Abnormal; Notable for the following:    Glucose, Bld 119 (*)    All other components within normal limits  PROTIME-INR - Abnormal; Notable for the following:    Prothrombin Time 17.9 (*)    All other components within normal limits  APTT - Abnormal; Notable for the following:    aPTT 37 (*)    All other components within normal limits  BRAIN NATRIURETIC PEPTIDE - Abnormal; Notable for the following:    B Natriuretic Peptide 332.0 (*)    All other components within normal limits  TROPONIN I    RADIOLOGY Images were viewed by me  Chest x-ray FINDINGS: There are emphysematous changes throughout the lungs. Asymmetric density is identified in the left lung base, raising the question of early infiltrate. Heart size is accentuated by the technique.  IMPRESSION: 1. Emphysematous changes. 2. Asymmetric density in the left lung raising the question of early infiltrate.____________________________________________  CRITICAL CARE Performed by: Earleen Newport   Total critical care  time: 30 minutes  Critical care time was exclusive of separately billable procedures and treating other patients.  Critical care was necessary to treat or prevent imminent or life-threatening deterioration.  Critical care was time spent personally by me on the following activities: development of treatment plan with patient and/or surrogate as well as nursing, discussions with consultants, evaluation of patient's response to treatment, examination of patient, obtaining history from patient or surrogate, ordering and performing treatments and interventions, ordering and review of laboratory studies, ordering and review of radiographic studies, pulse oximetry and re-evaluation of patient's condition.   FINAL ASSESSMENT AND PLAN  Atrial fibrillation with a rapid ventricular response, pneumonia  Plan: Patient improving with IV Cardizem and medications. Patient has left basilar pneumonia, was given IV Levaquin. She did convert to normal sinus rhythm after being in rapid A. fib and controlled atrial fibrillation for a period time the ER. She still is a fell off go home, still short of breath. Had a CT with contrast of her chest this month, will not do CTA at this time. Will need hospitalization and observation.   Earleen Newport, MD   Earleen Newport, MD 03/25/15 [phone]

## 2015-03-25 NOTE — ED Notes (Signed)
Pt presents to ED w/ elevated HR.  Pt reports feeling SOB lately.  Pt reports having black tarry stools.

## 2015-03-25 NOTE — ED Notes (Signed)
MD at bedside. 

## 2015-03-26 ENCOUNTER — Observation Stay
Admit: 2015-03-26 | Discharge: 2015-03-26 | Disposition: A | Discharge status: Home / Self Care | Payer: PPO | Attending: Cardiology | Admitting: Cardiology

## 2015-03-26 LAB — BASIC METABOLIC PANEL
Anion gap: 5 (ref 5–15)
BUN: 11 mg/dL (ref 6–20)
CO2: 27 mmol/L (ref 22–32)
CREATININE: 0.84 mg/dL (ref 0.44–1.00)
Calcium: 8.3 mg/dL — ABNORMAL LOW (ref 8.9–10.3)
Chloride: 108 mmol/L (ref 101–111)
GFR calc Af Amer: >60 mL/min (ref >60)
GFR calc non Af Amer: >60 mL/min (ref >60)
GLUCOSE: 100 mg/dL — AB (ref 65–99)
POTASSIUM: 4.3 mmol/L (ref 3.5–5.1)
Sodium: 140 mmol/L (ref 135–145)

## 2015-03-26 LAB — CBC
HCT: 33.7 % — ABNORMAL LOW (ref 35.0–47.0)
HEMOGLOBIN: 11.2 g/dL — AB (ref 12.0–16.0)
MCH: 27.5 pg (ref 26.0–34.0)
MCHC: 33.2 g/dL (ref 32.0–36.0)
MCV: 82.9 fL (ref 80.0–100.0)
Platelets: 298 10*3/uL (ref 150–440)
RBC: 4.07 MIL/uL (ref 3.80–5.20)
RDW: 14.7 % — AB (ref 11.5–14.5)
WBC: 10.9 10*3/uL (ref 3.6–11.0)

## 2015-03-26 LAB — TROPONIN I

## 2015-03-26 MED ORDER — SOTALOL HCL 80 MG PO TABS
80.0000 mg | ORAL_TABLET | Freq: Two times a day (BID) | ORAL | Status: DC
  Administered 2015-03-26 – 2015-03-29 (×7): 80 mg via ORAL
  Filled 2015-03-26 (×8): qty 1

## 2015-03-26 MED ORDER — METOPROLOL SUCCINATE ER 50 MG PO TB24
50.0000 mg | ORAL_TABLET | Freq: Every day | ORAL | Status: DC
  Administered 2015-03-27 – 2015-03-29 (×3): 50 mg via ORAL
  Filled 2015-03-26 (×3): qty 1

## 2015-03-26 NOTE — Consult Note (Signed)
[facility] Cardiology  CARDIOLOGY CONSULT NOTE  Patient ID: Julie Mcmillan MRN: [id number] DOB/AGE: 75-Sep-1942 75 y.o.  Admit date: 03/25/2015 Referring Physician Anselm Jungling Primary Physician Glendon Axe MD Primary Cardiologist  Reason for Consultation Atrial fibrillation with a rapid ventricular rate  HPI: The patient is a 74 year old female with history of paroxysmal atrial fibrillation, essential hypertension, interstitial lung disease, diverticulitis, referred for evaluation of atrial fibrillation with a rapid ventricular rate. She recently moved from [place] to [facility] 6 months ago to be closer to her son. The patient apparently was diagnosed with paroxysmal atrial fibrillation approximately 6 months ago and started on Xarelto for stroke prevention. The patient's had a recent history of diverticulitis and just finished a course of anti-biotics. Approximately one week ago the patient experienced a black tarry stool for the first time. She presented to [facility] emergency room with adductive cough, shortness of breath, and dizziness. In the emergency room the patient was noted to be in atrial fibrillation with a rapid ventricular rate of 140 bpm. Patient was given 2 Cardizem intravenous boluses and converted to sinus rhythm. Chest x-ray revealed evidence for left lower lobe infiltrate. Patient was noted to be anemic with a hemoglobin and hematocrit 11.2 and 33.7, respectively.  Review of systems complete and found to be negative unless listed above     Past Medical History  Diagnosis Date  . Irregular heart beat   . Hypertension   . IBS (irritable bowel syndrome)   . Shortness of breath dyspnea   . GERD (gastroesophageal reflux disease)   . Arthritis   . Interstitial lung disease   . Seasonal affective disorder     Past Surgical History  Procedure Laterality Date  . Nissen fundoplication    . Abdominal hysterectomy      partial  . Appendectomy    .  Tonsillectomy      and adenoidectomy  . Colonoscopy    . Cardiac catheterization      "20 yrs ago" - all ok  . Cataract extraction w/phaco Right 03/16/2015    Procedure: CATARACT EXTRACTION PHACO AND INTRAOCULAR LENS PLACEMENT (IOC);  Surgeon: Leandrew Koyanagi, MD;  Location: [place];  Service: Ophthalmology;  Laterality: Right;    Prescriptions prior to admission  Medication Sig Dispense Refill Last Dose  . acetaminophen (TYLENOL) 325 MG tablet Take 650 mg by mouth as needed.   03/24/2015 at Unknown time  . CALCIUM PO Take by mouth.   03/24/2015 at Unknown time  . ferrous fumarate (HEMOCYTE - 106 MG FE) 325 (106 FE) MG TABS tablet Take 1 tablet by mouth daily. Lunchtime   03/24/2015 at Unknown time  . hydrALAZINE (APRESOLINE) 25 MG tablet Take 25 mg by mouth 3 (three) times daily.   03/24/2015 at Unknown time  . lansoprazole (PREVACID) 15 MG capsule Take 1 capsule by mouth daily.   03/24/2015 at Unknown time  . levothyroxine (SYNTHROID, LEVOTHROID) 150 MCG tablet Take 150 mcg by mouth daily before breakfast.   03/24/2015 at Unknown time  . losartan (COZAAR) 100 MG tablet Take 100 mg by mouth daily. Lunchtime   03/24/2015 at Unknown time  . metFORMIN (GLUCOPHAGE) 500 MG tablet Take by mouth 2 (two) times daily with a meal.   03/24/2015 at Unknown time  . metoprolol succinate (TOPROL-XL) 100 MG 24 hr tablet Take 100 mg by mouth daily. AM   03/24/2015 at Unknown time  . Multiple Vitamin (MULTI-VITAMIN DAILY PO) Take 1 tablet by mouth daily.  03/25/2015 at Unknown time  . omeprazole (PRILOSEC) 20 MG capsule Take 20 mg by mouth daily. AM   03/24/2015 at Unknown time  . rivaroxaban (XARELTO) 20 MG TABS tablet Take 20 mg by mouth daily with supper.   03/24/2015 at Unknown time  . venlafaxine (EFFEXOR) 100 MG tablet Take 100 mg by mouth daily. Lunchtime   03/24/2015 at Unknown time  . venlafaxine (EFFEXOR) 37.5 MG tablet Take 37.5 mg by mouth daily. Lunchtime   03/24/2015 at Unknown time   History    Social History  . Marital Status: Divorced    Spouse Name: N/A  . Number of Children: N/A  . Years of Education: N/A   Occupational History  . Not on file.   Social History Main Topics  . Smoking status: Never Smoker   . Smokeless tobacco: Not on file  . Alcohol Use: No  . Drug Use: Not on file  . Sexual Activity: Not on file   Other Topics Concern  . Not on file   Social History Narrative    Family History  Problem Relation Age of Onset  . Breast cancer Mother   . Diabetes Mellitus II Mother   . Hypothyroidism Mother   . Atrial fibrillation Mother   . Heart attack Father       Review of systems complete and found to be negative unless listed above      PHYSICAL EXAM  General: Well developed, well nourished, in no acute distress HEENT:  Normocephalic and atramatic Neck:  No JVD.  Lungs: Clear bilaterally to auscultation and percussion. Heart: HRRR . Normal S1 and S2 without gallops or murmurs.  Abdomen: Bowel sounds are positive, abdomen soft and non-tender  Msk:  Back normal, normal gait. Normal strength and tone for age. Extremities: No clubbing, cyanosis or edema.   Neuro: Alert and oriented X 3. Psych:  Good affect, responds appropriately  Labs:   Lab Results  Component Value Date   WBC 10.9 03/26/2015   HGB 11.2* 03/26/2015   HCT 33.7* 03/26/2015   MCV 82.9 03/26/2015   PLT 298 03/26/2015    Recent Labs Lab 03/25/15 1450 03/26/15 0301  NA 138 140  K 3.7 4.3  CL 105 108  CO2 23 27  BUN 10 11  CREATININE 0.90 0.84  CALCIUM 8.9 8.3*  PROT 7.3  --   BILITOT 0.5  --   ALKPHOS 88  --   ALT 15  --   AST 24  --   GLUCOSE 119* 100*   Lab Results  Component Value Date   TROPONINI <0.03 03/26/2015   No results found for: CHOL No results found for: HDL No results found for: LDLCALC No results found for: TRIG No results found for: CHOLHDL No results found for: LDLDIRECT    Radiology: Ct Chest W Contrast  03/04/2015   CLINICAL DATA:   74 year old female with left flank pain intermittent but increasing x1 year. Severe nausea. Initial encounter. Irritable bowel syndrome. Surgical history of previous fundoplication with recurrent hernia, and cholecystectomy.  EXAM: CT CHEST, ABDOMEN, AND PELVIS WITH CONTRAST  TECHNIQUE: Multidetector CT imaging of the chest, abdomen and pelvis was performed following the standard protocol during bolus administration of intravenous contrast.  CONTRAST:  135m OMNIPAQUE IOHEXOL 300 MG/ML  SOLN  COMPARISON:  None.  FINDINGS: CT CHEST FINDINGS  Basilar predominant bilateral abnormal reticular peripheral pulmonary opacity. There is some associated traction bronchiectasis, greater in the lower lobes. There is early honeycombing in the costophrenic  angles. Major airways are patent. No superimposed pulmonary consolidation or lung mass identified. No pleural effusion.  No pericardial effusion. Moderate-sized gastric hiatal hernia. Calcified atherosclerosis of the coronary arteries is evident (series 2, image 32). Predominantly soft plaque in the thoracic aorta. Mildly to moderately enlarged central pulmonary arteries (main pulmonary artery 33 mm diameter versus adjacent ascending aorta of 26 mm diameter). A maximal precarinal lymph node seen on series 2, image 24 is partially calcified. Otherwise no mediastinal or hilar lymphadenopathy.  Negative thoracic inlet.  No axillary lymphadenopathy.  Severe T8 compression fracture appears to be subacute. Adjacent vacuum disc. Minimal retropulsion of bone without associated thoracic spinal stenosis at this level.  Elsewhere no acute osseous abnormality identified in the chest.  CT ABDOMEN AND PELVIS FINDINGS  Normal lumbar segmentation. Mild for age lumbar spine degeneration. Right greater than left hip joint degeneration. Subchondral sclerosis and cysts on the right. No definite femoral head collapse. Elsewhere No acute osseous abnormality identified. Identified in the abdomen or  pelvis.  No pelvic free fluid. Severe sigmoid diverticulosis extending to the proximal rectum with mild wall thickening, but no definite associated mesenteric stranding. Oral contrast has reached the rectum without obstruction.  The uterus is surgically absent. The left adnexa may remain and appears normal. The right adnexa is not identified. The bladder is diminutive.  Mild diverticulosis of the left colon which otherwise appears normal. Negative transverse colon. Occasional diverticulosis of the right colon. Negative cecum and IC valve. No appendix is identified. Negative terminal ileum. No inflamed small bowel loops. Some small bowel loops measure at the upper limits of normal, but there is no evidence of mechanical obstruction. Negative gastric body and antrum. Duodenum is within normal limits.  Surgically absent gallbladder. Biliary tree within normal limits in that setting. Liver, spleen, pancreas and adrenal glands are within normal limits. Portal venous system is patent. Mild Aortoiliac calcified atherosclerosis noted. Major arterial structures in the abdomen and pelvis are patent. No abdominal free fluid. Bilateral renal enhancement and contrast excretion is normal. No lymphadenopathy in the abdomen or pelvis.  IMPRESSION: 1. Severe sigmoid diverticulosis with wall thickening suggesting mild diverticulitis. No associated mesenteric inflammation or other complicating feature. 2. Subacute appearing severe T8 compression fracture. No complicating features. 3. No other acute or subacute process identified in the chest, abdomen, or pelvis. 4. 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 5. Mild for age aortic atherosclerosis. Some calcified coronary artery plaque is evident. 6. Right hip osteoarthritis.   Electronically Signed   By: Genevie Ann M.D.   On: 03/04/2015 14:13   Ct Abdomen Pelvis W Contrast  03/04/2015   CLINICAL DATA:  74 year old female with left flank pain  intermittent but increasing x1 year. Severe nausea. Initial encounter. Irritable bowel syndrome. Surgical history of previous fundoplication with recurrent hernia, and cholecystectomy.  EXAM: CT CHEST, ABDOMEN, AND PELVIS WITH CONTRAST  TECHNIQUE: Multidetector CT imaging of the chest, abdomen and pelvis was performed following the standard protocol during bolus administration of intravenous contrast.  CONTRAST:  1100m OMNIPAQUE IOHEXOL 300 MG/ML  SOLN  COMPARISON:  None.  FINDINGS: CT CHEST FINDINGS  Basilar predominant bilateral abnormal reticular peripheral pulmonary opacity. There is some associated traction bronchiectasis, greater in the lower lobes. There is early honeycombing in the costophrenic angles. Major airways are patent. No superimposed pulmonary consolidation or lung mass identified. No pleural effusion.  No pericardial effusion. Moderate-sized gastric hiatal hernia. Calcified atherosclerosis of the coronary arteries is evident (series 2, image 32).  Predominantly soft plaque in the thoracic aorta. Mildly to moderately enlarged central pulmonary arteries (main pulmonary artery 33 mm diameter versus adjacent ascending aorta of 26 mm diameter). A maximal precarinal lymph node seen on series 2, image 24 is partially calcified. Otherwise no mediastinal or hilar lymphadenopathy.  Negative thoracic inlet.  No axillary lymphadenopathy.  Severe T8 compression fracture appears to be subacute. Adjacent vacuum disc. Minimal retropulsion of bone without associated thoracic spinal stenosis at this level.  Elsewhere no acute osseous abnormality identified in the chest.  CT ABDOMEN AND PELVIS FINDINGS  Normal lumbar segmentation. Mild for age lumbar spine degeneration. Right greater than left hip joint degeneration. Subchondral sclerosis and cysts on the right. No definite femoral head collapse. Elsewhere No acute osseous abnormality identified. Identified in the abdomen or pelvis.  No pelvic free fluid. Severe  sigmoid diverticulosis extending to the proximal rectum with mild wall thickening, but no definite associated mesenteric stranding. Oral contrast has reached the rectum without obstruction.  The uterus is surgically absent. The left adnexa may remain and appears normal. The right adnexa is not identified. The bladder is diminutive.  Mild diverticulosis of the left colon which otherwise appears normal. Negative transverse colon. Occasional diverticulosis of the right colon. Negative cecum and IC valve. No appendix is identified. Negative terminal ileum. No inflamed small bowel loops. Some small bowel loops measure at the upper limits of normal, but there is no evidence of mechanical obstruction. Negative gastric body and antrum. Duodenum is within normal limits.  Surgically absent gallbladder. Biliary tree within normal limits in that setting. Liver, spleen, pancreas and adrenal glands are within normal limits. Portal venous system is patent. Mild Aortoiliac calcified atherosclerosis noted. Major arterial structures in the abdomen and pelvis are patent. No abdominal free fluid. Bilateral renal enhancement and contrast excretion is normal. No lymphadenopathy in the abdomen or pelvis.  IMPRESSION: 1. Severe sigmoid diverticulosis with wall thickening suggesting mild diverticulitis. No associated mesenteric inflammation or other complicating feature. 2. Subacute appearing severe T8 compression fracture. No complicating features. 3. No other acute or subacute process identified in the chest, abdomen, or pelvis. 4. 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 5. Mild for age aortic atherosclerosis. Some calcified coronary artery plaque is evident. 6. Right hip osteoarthritis.   Electronically Signed   By: Genevie Ann M.D.   On: 03/04/2015 14:13   Dg Chest Port 1 View  03/25/2015   CLINICAL DATA:  Shortness of breath since Monday.  Nonsmoker.  EXAM: PORTABLE CHEST - 1 VIEW  COMPARISON:   Chest CT 03/04/2015  FINDINGS: There are emphysematous changes throughout the lungs. Asymmetric density is identified in the left lung base, raising the question of early infiltrate. Heart size is accentuated by the technique.  IMPRESSION: 1. Emphysematous changes. 2. Asymmetric density in the left lung raising the question of early infiltrate.   Electronically Signed   By: Nolon Nations M.D.   On: 03/25/2015 15:48    EKG: Atrial fibrillation with a rapid ventricular rate  ASSESSMENT AND PLAN:   74 year old female with history of paroxysmal atrial fibrillation, who presents with atrial fibrillation with a rapid ventricular rate, converted to sinus rhythm after Cardizem bolus. The patient has a six-month history of intermittent episodes of atrial fibrillation, started on some relative for stroke prevention. She also has a history of diverticulitis, with recent black tarry stool, currently anemic.  Recommendations  1. Continue to hold Xarelto 2. Start sotalol 80 mg twice a day 3. Reduce  metoprolol succinate 50 mg daily 4. Echocardiogram to evaluate left ventricular function 5. Daily ECGs  Signed: Promise Bushong MD,PhD, [facility] 03/26/2015, 10:43 AM

## 2015-03-26 NOTE — Consult Note (Signed)
GI Inpatient Consult Note  Reason for Consult:  Recurrent diverticulitis; melena on Xarelto    Attending Requesting Consult:  Dr. Anselm Jungling  History of Present Illness: Julie Mcmillan is a 74 y.o. female who reports that she had her last episode of diverticulitis June 9th or 10th.  She was SOB and her pulmonary provider did a CT and informed her of the episode. She has had multiple episodes and can always tell when she has them, except this time she did not have any pain. She normally will experience RLQ pain.  She finished the 10 days course of abx. On Friday, June 17th, she had an episode of diarrhea ( which is normal for her since childhood) and she reports that when she stood up a "clump of tar fell out onto the floor"  She described it as "not watery like stool would be"  She continued with episodes of diarrhea that day with slivers of tarry stool in it.  She took two imodium on Sunday so she could go to a picnic for Fathers day unfortunately without relief.  She had an episode at the park, but noted that is was just brown liquid.  She also reports that the stools did not any type of smell to them, which she said was unusual for her. She denies nausea or vomiting.  She has not had another BM since Wednesday.  She has an extensive GI history.  She recently moved to this area.  She is due to see Faye Ramsay, NP at [facility] on June 29th to establish care.   Past Medical History:  Past Medical History  Diagnosis Date  . Irregular heart beat   . Hypertension   . IBS (irritable bowel syndrome)   . Shortness of breath dyspnea   . GERD (gastroesophageal reflux disease)   . Arthritis   . Interstitial lung disease   . Seasonal affective disorder     Problem List: Patient Active Problem List   Diagnosis Date Noted  . Atrial fibrillation with rapid ventricular response 03/25/2015    Past Surgical History: Past Surgical History  Procedure Laterality Date  . Nissen fundoplication    .  Abdominal hysterectomy      partial  . Appendectomy    . Tonsillectomy      and adenoidectomy  . Colonoscopy    . Cardiac catheterization      "20 yrs ago" - all ok  . Cataract extraction w/phaco Right 03/16/2015    Procedure: CATARACT EXTRACTION PHACO AND INTRAOCULAR LENS PLACEMENT (IOC);  Surgeon: Leandrew Koyanagi, MD;  Location: [place];  Service: Ophthalmology;  Laterality: Right;    Allergies: Allergies  Allergen Reactions  . Amlodipine Swelling  . Nexium [Esomeprazole Magnesium] Diarrhea    Home Medications: Prescriptions prior to admission  Medication Sig Dispense Refill Last Dose  . acetaminophen (TYLENOL) 325 MG tablet Take 650 mg by mouth as needed.   03/24/2015 at Unknown time  . CALCIUM PO Take by mouth.   03/24/2015 at Unknown time  . ferrous fumarate (HEMOCYTE - 106 MG FE) 325 (106 FE) MG TABS tablet Take 1 tablet by mouth daily. Lunchtime   03/24/2015 at Unknown time  . hydrALAZINE (APRESOLINE) 25 MG tablet Take 25 mg by mouth 3 (three) times daily.   03/24/2015 at Unknown time  . lansoprazole (PREVACID) 15 MG capsule Take 1 capsule by mouth daily.   03/24/2015 at Unknown time  . levothyroxine (SYNTHROID, LEVOTHROID) 150 MCG tablet Take 150 mcg by  mouth daily before breakfast.   03/24/2015 at Unknown time  . losartan (COZAAR) 100 MG tablet Take 100 mg by mouth daily. Lunchtime   03/24/2015 at Unknown time  . metFORMIN (GLUCOPHAGE) 500 MG tablet Take by mouth 2 (two) times daily with a meal.   03/24/2015 at Unknown time  . metoprolol succinate (TOPROL-XL) 100 MG 24 hr tablet Take 100 mg by mouth daily. AM   03/24/2015 at Unknown time  . Multiple Vitamin (MULTI-VITAMIN DAILY PO) Take 1 tablet by mouth daily.   03/25/2015 at Unknown time  . omeprazole (PRILOSEC) 20 MG capsule Take 20 mg by mouth daily. AM   03/24/2015 at Unknown time  . rivaroxaban (XARELTO) 20 MG TABS tablet Take 20 mg by mouth daily with supper.   03/24/2015 at Unknown time  . venlafaxine (EFFEXOR) 100  MG tablet Take 100 mg by mouth daily. Lunchtime   03/24/2015 at Unknown time  . venlafaxine (EFFEXOR) 37.5 MG tablet Take 37.5 mg by mouth daily. Lunchtime   03/24/2015 at Unknown time   Home medication reconciliation was completed with the patient.   Scheduled Inpatient Medications:   . diltiazem  20 mg Intravenous Once  . hydrALAZINE  25 mg Oral TID  . levofloxacin  500 mg Oral Daily  . levothyroxine  150 mcg Oral QAC breakfast  . losartan  100 mg Oral Daily  . metFORMIN  500 mg Oral BID WC  . metoprolol succinate  100 mg Oral Daily  . multivitamin with minerals  1 tablet Oral Daily  . pantoprazole  40 mg Oral Daily  . sodium chloride  3 mL Intravenous Q12H  . venlafaxine XR  150 mg Oral Q lunch  . venlafaxine XR  37.5 mg Oral Q lunch    Continuous Inpatient Infusions:     PRN Inpatient Medications:  acetaminophen  Family History: family history includes Atrial fibrillation in her mother; Breast cancer in her mother; Diabetes Mellitus II in her mother; Heart attack in her father; Hypothyroidism in her mother.    Social History:   reports that she has never smoked. She does not have any smokeless tobacco history on file. She reports that she does not drink alcohol.   Review of Systems: Constitutional: Weight is stable.  Eyes: No changes in vision. ENT: No oral lesions, sore throat.  GI: see HPI.  Heme/Lymph: No easy bruising.  CV: No chest pain.  GU: No hematuria.  Integumentary: No rashes.  Neuro: No headaches.  Psych: No depression/anxiety.  Endocrine: No heat/cold intolerance.  Allergic/Immunologic: No urticaria.  Resp: + for SOB  Musculoskeletal: No joint swelling.    Physical Examination: BP 150/69 mmHg  Pulse 73  Temp(Src) 98.7 F (37.1 C) (Oral)  Resp 16  Ht 5' 4" (1.626 m)  Wt 68.04 kg (150 lb)  BMI 25.73 kg/m2  SpO2 94% Gen: NAD, alert and oriented x 4 HEENT: PEERLA, EOMI, Neck: supple, no JVD or thyromegaly Chest: CTA bilaterally, no wheezes,  crackles, or other adventitious sounds CV: RRR, no m/g/c/r Abd: soft, NT, ND, +BS in all four quadrants; no HSM, guarding, ridigity, or rebound tenderness Ext: no edema, well perfused with 2+ pulses, Skin: no rash or lesions noted Lymph: no LAD Rectal exam: negative without mass, lesions or tenderness, stool guaiac negative, small amount of brown stool in the rectal vault  Data: Lab Results  Component Value Date   WBC 10.9 03/26/2015   HGB 11.2* 03/26/2015   HCT 33.7* 03/26/2015   MCV 82.9 03/26/2015  PLT 298 03/26/2015    Recent Labs Lab 03/25/15 1450 03/26/15 0301  HGB 13.0 11.2*   Lab Results  Component Value Date   NA 140 03/26/2015   K 4.3 03/26/2015   CL 108 03/26/2015   CO2 27 03/26/2015   BUN 11 03/26/2015   CREATININE 0.84 03/26/2015   Lab Results  Component Value Date   ALT 15 03/25/2015   AST 24 03/25/2015   ALKPHOS 88 03/25/2015   BILITOT 0.5 03/25/2015    Recent Labs Lab 03/25/15 1450  APTT 37*  INR 1.46   Imaging:  CLINICAL DATA: 74 year old female with left flank pain intermittent but increasing x1 year. Severe nausea. Initial encounter. Irritable bowel syndrome. Surgical history of previous fundoplication with recurrent hernia, and cholecystectomy.  EXAM: CT CHEST, ABDOMEN, AND PELVIS WITH CONTRAST  TECHNIQUE: Multidetector CT imaging of the chest, abdomen and pelvis was performed following the standard protocol during bolus administration of intravenous contrast.  CONTRAST: 151m OMNIPAQUE IOHEXOL 300 MG/ML SOLN  COMPARISON: None.  FINDINGS: CT CHEST FINDINGS  Basilar predominant bilateral abnormal reticular peripheral pulmonary opacity. There is some associated traction bronchiectasis, greater in the lower lobes. There is early honeycombing in the costophrenic angles. Major airways are patent. No superimposed pulmonary consolidation or lung mass identified. No pleural effusion.  No pericardial effusion.  Moderate-sized gastric hiatal hernia. Calcified atherosclerosis of the coronary arteries is evident (series 2, image 32). Predominantly soft plaque in the thoracic aorta. Mildly to moderately enlarged central pulmonary arteries (main pulmonary artery 33 mm diameter versus adjacent ascending aorta of 26 mm diameter). A maximal precarinal lymph node seen on series 2, image 24 is partially calcified. Otherwise no mediastinal or hilar lymphadenopathy.  Negative thoracic inlet. No axillary lymphadenopathy.  Severe T8 compression fracture appears to be subacute. Adjacent vacuum disc. Minimal retropulsion of bone without associated thoracic spinal stenosis at this level.  Elsewhere no acute osseous abnormality identified in the chest.  CT ABDOMEN AND PELVIS FINDINGS  Normal lumbar segmentation. Mild for age lumbar spine degeneration. Right greater than left hip joint degeneration. Subchondral sclerosis and cysts on the right. No definite femoral head collapse. Elsewhere No acute osseous abnormality identified. Identified in the abdomen or pelvis.  No pelvic free fluid. Severe sigmoid diverticulosis extending to the proximal rectum with mild wall thickening, but no definite associated mesenteric stranding. Oral contrast has reached the rectum without obstruction.  The uterus is surgically absent. The left adnexa may remain and appears normal. The right adnexa is not identified. The bladder is diminutive.  Mild diverticulosis of the left colon which otherwise appears normal. Negative transverse colon. Occasional diverticulosis of the right colon. Negative cecum and IC valve. No appendix is identified. Negative terminal ileum. No inflamed small bowel loops. Some small bowel loops measure at the upper limits of normal, but there is no evidence of mechanical obstruction. Negative gastric body and antrum. Duodenum is within normal limits.  Surgically absent gallbladder. Biliary  tree within normal limits in that setting. Liver, spleen, pancreas and adrenal glands are within normal limits. Portal venous system is patent. Mild Aortoiliac calcified atherosclerosis noted. Major arterial structures in the abdomen and pelvis are patent. No abdominal free fluid. Bilateral renal enhancement and contrast excretion is normal. No lymphadenopathy in the abdomen or pelvis.  IMPRESSION: 1. Severe sigmoid diverticulosis with wall thickening suggesting mild diverticulitis. No associated mesenteric inflammation or other complicating feature. 2. Subacute appearing severe T8 compression fracture. No complicating features. 3. No other acute or subacute process identified  in the chest, abdomen, or pelvis. 4. 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 5. Mild for age aortic atherosclerosis. Some calcified coronary artery plaque is evident. 6. Right hip osteoarthritis.   Electronically Signed  By: Genevie Ann M.D.  On: 03/04/2015 14:13  Previous Colonoscopy 03/2010 Clinical History High risk colon cancer surveillance:personal history of colonic polyps, chronic diarrhea.  Impression:Preparation of the colon was fair.Two 3 mm polyps in the transverse colon and in the hepatic flexure.Resected and retrieved. Diverticulosis sigmoid colon and descending colon.   Gross Examination A. "Random colon biopsy-cold forceps", received in formalin on filter paper are 9 fragments of pink-tan tissue up to 0.5 cm in greatest dimension, entirely submitted in toto in blocks A1 and A2 for special processing.  B. "Colon polyps-cold forceps", received in formalin on filter paper are 2 fragments of pink-tan tissue up to 0.2 cm in greatest dimension, entirely submitted in toto in block B1 for special processing.  M. Vazquez/Dr. Crow/slides to Dr. Karmen Bongo   Microscopic Examination Microscopic examination is performed.   Diagnosis A. RANDOM COLON  BIOPSY, ENDOSCOPIC BIOPSY:  COLONIC MUCOSA WITH NO SIGNIFICANT PATHOLOGIC ALTERATION.  B. COLON POLYPS, ENDOSCOPIC POLYPECTOMY:  TUBULAR ADENOMAS.Comment:  I certify that I personally conducted the diagnostic evaluation of the above specimen(s) and have rendered the above diagnosis(es). Danne Harbor, M.D. Electronically signed: 03/25/10      Assessment/Plan: Ms. Travieso is a 74 y.o. female with recurrent diverticulitis with recent melena on Xarelto  Recommendations: Ms. Koval has not had a BM since Wednesday.  Her rectal exam was stool guaiac negative, small amount of brown stool in the rectal vault.  We recommend a glycerin suppository to help with the BM, a fleets enema if that fails.  We agree that the CBC be repeated daily to watch for a downward trend in her Hgb. We agree with continuing the Protonix. We will continue to follow with you.  Thank you for the consult. Please call with questions or concerns.  Salvadore Farber, PA-C  I personally performed these services.

## 2015-03-26 NOTE — Progress Notes (Signed)
*  PRELIMINARY RESULTS* Echocardiogram 2D Echocardiogram has been performed.  Julie Mcmillan 03/26/2015, 12:15 PM

## 2015-03-26 NOTE — Progress Notes (Signed)
[place]  PROGRESS NOTE Date of Admission:  03/25/2015     ID: Julie Mcmillan is a 74 y.o. female with  A fib with RVT, PNA, tarry stools, recent diverticulitis Principal Problem:   Atrial fibrillation with rapid ventricular response   Subjective:   ROS  Eleven systems are reviewed and negative except per hpi  Medications:  Antibiotics Given (last 72 hours)    Date/Time Action Medication Dose   03/25/15 2200 Given   levofloxacin (LEVAQUIN) tablet 500 mg 500 mg   03/26/15 0929 Given   levofloxacin (LEVAQUIN) tablet 500 mg 500 mg     . diltiazem  20 mg Intravenous Once  . hydrALAZINE  25 mg Oral TID  . levofloxacin  500 mg Oral Daily  . levothyroxine  150 mcg Oral QAC breakfast  . losartan  100 mg Oral Daily  . metFORMIN  500 mg Oral BID WC  . [START ON 03/27/2015] metoprolol succinate  50 mg Oral Daily  . multivitamin with minerals  1 tablet Oral Daily  . pantoprazole  40 mg Oral Daily  . sodium chloride  3 mL Intravenous Q12H  . sotalol  80 mg Oral Q12H  . venlafaxine XR  150 mg Oral Q lunch  . venlafaxine XR  37.5 mg Oral Q lunch    Objective: Vital signs in last 24 hours: Temp:  [97.8 F (36.6 C)-98.7 F (37.1 C)] 98.7 F (37.1 C) (06/25 0800) Pulse Rate:  [42-148] 73 (06/25 0800) Resp:  [13-28] 16 (06/25 0800) BP: (122-157)/(55-95) 150/69 mmHg (06/25 0800) SpO2:  [94 %-100 %] 94 % (06/25 0800) Weight:  [68.04 kg (150 lb)] 68.04 kg (150 lb) (06/24 1453) General appearance: alert Head: Normocephalic, without obvious abnormality, atraumatic Eyes: conjunctivae/corneas clear. PERRL, EOM's intact. Fundi benign. Throat: lips, mucosa, and tongue normal; teeth and gums normal Neck: no adenopathy, no carotid bruit, no JVD, supple, symmetrical, trachea midline and thyroid not enlarged, symmetric, no tenderness/mass/nodules Resp: crackles bb Chest wall: no tenderness Cardio: irregularly irregular rhythm GI: soft, non-tender; bowel sounds normal; no masses,  no  organomegaly Extremities: extremities normal, atraumatic, no cyanosis or edema Skin: Skin color, texture, turgor normal. No rashes or lesions Neurologic: Alert and oriented X 3, normal strength and tone. Normal symmetric reflexes. Normal coordination and gait  Lab Results  Recent Labs  03/25/15 1450 03/26/15 0301  WBC 17.5* 10.9  HGB 13.0 11.2*  HCT 40.0 33.7*  NA 138 140  K 3.7 4.3  CL 105 108  CO2 23 27  BUN 10 11  CREATININE 0.90 0.84    Microbiology: No results found for this or any previous visit.  Studies/Results: Dg Chest Port 1 View  03/25/2015   CLINICAL DATA:  Shortness of breath since Monday.  Nonsmoker.  EXAM: PORTABLE CHEST - 1 VIEW  COMPARISON:  Chest CT 03/04/2015  FINDINGS: There are emphysematous changes throughout the lungs. Asymmetric density is identified in the left lung base, raising the question of early infiltrate. Heart size is accentuated by the technique.  IMPRESSION: 1. Emphysematous changes. 2. Asymmetric density in the left lung raising the question of early infiltrate.   Electronically Signed   By: Nolon Nations M.D.   On: 03/25/2015 15:48    Assessment/Plan: Atrial fibrillation with rapid ventricular response Responded to 2 Cardizem injection by ER, Continue on telemetry.  Neg troponin, TSH slightly elevated - cardiology consult.- hold xarelto, check echo, started sotalol  She was already taking Xarelto- I will hold it has she had black  stool a few days ago and having GI issues - consider change to eliquis if restarted on anticoaglation.  * Black stool and diarrhea Last stool was 2 days ago, patient has been recently treated for diverticulitis with 10 days' course of ciprofloxacin and Flagyl.  hold her Xarelto - CT abd pelvesi reveiwed from last week  GI consult. If she starts having diarrhea again I will prefer to send stool sample for C. Difficile and culture.  * Diabetes She takes metformin will continue that  *  Hypothyroidism  She is on levothyroxine and continue that.   * GERD Continue PPI.  * Pneumonia - priorrecnet ct showed likely pulmonary fibrosis with scarring, honeycombing and bronchiectasis - she states this was known and she followed with Wynn Maudlin in pulm at [facility] We will give Levaquin for now. Check sputum cx  Thank you very much for the consult. Will follow with you.  [place], [place]   03/26/2015, 10:53 AM

## 2015-03-27 LAB — BASIC METABOLIC PANEL
Anion gap: 8 (ref 5–15)
BUN: 11 mg/dL (ref 6–20)
CALCIUM: 8.6 mg/dL — AB (ref 8.9–10.3)
CO2: 26 mmol/L (ref 22–32)
Chloride: 104 mmol/L (ref 101–111)
Creatinine, Ser: 0.76 mg/dL (ref 0.44–1.00)
GFR calc Af Amer: >60 mL/min (ref >60)
GFR calc non Af Amer: >60 mL/min (ref >60)
Glucose, Bld: 107 mg/dL — ABNORMAL HIGH (ref 65–99)
POTASSIUM: 4 mmol/L (ref 3.5–5.1)
SODIUM: 138 mmol/L (ref 135–145)

## 2015-03-27 LAB — CBC
HCT: 34.7 % — ABNORMAL LOW (ref 35.0–47.0)
HEMOGLOBIN: 11.7 g/dL — AB (ref 12.0–16.0)
MCH: 28 pg (ref 26.0–34.0)
MCHC: 33.8 g/dL (ref 32.0–36.0)
MCV: 82.9 fL (ref 80.0–100.0)
Platelets: 318 10*3/uL (ref 150–440)
RBC: 4.19 MIL/uL (ref 3.80–5.20)
RDW: 14.4 % (ref 11.5–14.5)
WBC: 11.1 10*3/uL — AB (ref 3.6–11.0)

## 2015-03-27 NOTE — Progress Notes (Signed)
GI Inpatient Follow-up Note  Patient Identification: Julie Mcmillan is a 74 y.o. female with recurrent diverticulitis, melena on Xarelto  Subjective: Julie Mcmillan reports that she had a BM that was brown, slightly formed at first and then loose.  She did not use the glycerin suppository, but reported that she ate fruits and vegetables to help with the BM. She has no other GI complaints at this time.  She reports that she is feeling better and has tolerated a soft diet.   Scheduled Inpatient Medications:  . hydrALAZINE  25 mg Oral TID  . levofloxacin  500 mg Oral Daily  . levothyroxine  150 mcg Oral QAC breakfast  . losartan  100 mg Oral Daily  . metFORMIN  500 mg Oral BID WC  . metoprolol succinate  50 mg Oral Daily  . multivitamin with minerals  1 tablet Oral Daily  . pantoprazole  40 mg Oral Daily  . sodium chloride  3 mL Intravenous Q12H  . sotalol  80 mg Oral Q12H  . venlafaxine XR  150 mg Oral Q lunch  . venlafaxine XR  37.5 mg Oral Q lunch    Continuous Inpatient Infusions:     PRN Inpatient Medications:  acetaminophen  Review of Systems: Constitutional: Weight is stable.  Eyes: No changes in vision. ENT: No oral lesions, sore throat.  GI: see HPI.  Heme/Lymph: No easy bruising.  CV: No chest pain.  GU: No hematuria.  Integumentary: No rashes.  Neuro: No headaches.  Psych: No depression/anxiety.  Endocrine: No heat/cold intolerance.  Allergic/Immunologic: No urticaria.  Resp: No cough, SOB.  Musculoskeletal: No joint swelling.    Physical Examination: BP 140/64 mmHg  Pulse 87  Temp(Src) 97.7 F (36.5 C) (Oral)  Resp 18  Ht _0  (1.626 m)  Wt 68.675 kg (151 lb 6.4 oz)  BMI 25.98 kg/m2  SpO2 94% Gen: NAD, alert and oriented x 4 HEENT: PEERLA, EOMI, Neck: supple, no JVD or thyromegaly Chest: CTA bilaterally, no wheezes, crackles, or other adventitious sounds CV: RRR, no m/g/c/r Abd: soft, NT, ND, +BS in all four quadrants; no HSM, guarding, ridigity, or  rebound tenderness Ext: no edema, well perfused with 2+ pulses, Skin: no rash or lesions noted Lymph: no LAD  Data: Lab Results  Component Value Date   WBC 11.1* 03/27/2015   HGB 11.7* 03/27/2015   HCT 34.7* 03/27/2015   MCV 82.9 03/27/2015   PLT 318 03/27/2015    Recent Labs Lab 03/25/15 1450 03/26/15 0301 03/27/15 0512  HGB 13.0 11.2* 11.7*   Lab Results  Component Value Date   NA 138 03/27/2015   K 4.0 03/27/2015   CL 104 03/27/2015   CO2 26 03/27/2015   BUN 11 03/27/2015   CREATININE 0.76 03/27/2015   Lab Results  Component Value Date   ALT 15 03/25/2015   AST 24 03/25/2015   ALKPHOS 88 03/25/2015   BILITOT 0.5 03/25/2015    Recent Labs Lab 03/25/15 1450  APTT 37*  INR 1.46   Assessment/Plan: Julie Mcmillan is a 74 y.o. female with recurrent diverticulitis, melena on Xarelto  Recommendations: Julie Mcmillan was successful with a BM, no glycerin suppository needed.  She reported a brown stool. She is tolerating a soft diet well.  Her HgB is stable. No other GI complaints at this time.  We will continue to follow with you. Please call with questions or concerns.  Salvadore Farber, PA-C  I personally performed these services.

## 2015-03-27 NOTE — Progress Notes (Signed)
[facility]  SUBJECTIVE: I'm feeling better   Filed Vitals:   03/26/15 1948 03/27/15 0507 03/27/15 0715 03/27/15 0716  BP: 152/66 140/64    Pulse: 82 87    Temp: 98.6 F (37 C) 97.7 F (36.5 C)    TempSrc: Oral Oral    Resp: 18 18    Height:      Weight:  68.675 kg (151 lb 6.4 oz)    SpO2: 97% 94% 88% 94%     Intake/Output Summary (Last 24 hours) at 03/27/15 1155 Last data filed at 03/27/15 0954  Gross per 24 hour  Intake    480 ml  Output   1000 ml  Net   -520 ml      PHYSICAL EXAM  General: Well developed, well nourished, in no acute distress HEENT:  Normocephalic and atramatic Neck:  No JVD.  Lungs: Clear bilaterally to auscultation and percussion. Heart: HRRR . Normal S1 and S2 without gallops or murmurs.  Abdomen: Bowel sounds are positive, abdomen soft and non-tender  Msk:  Back normal, normal gait. Normal strength and tone for age. Extremities: No clubbing, cyanosis or edema.   Neuro: Alert and oriented X 3. Psych:  Good affect, responds appropriately   LABS: Basic Metabolic Panel:  Recent Labs  03/26/15 0301 03/27/15 0512  NA 140 138  K 4.3 4.0  CL 108 104  CO2 27 26  GLUCOSE 100* 107*  BUN 11 11  CREATININE 0.84 0.76  CALCIUM 8.3* 8.6*   Liver Function Tests:  Recent Labs  03/25/15 1450  AST 24  ALT 15  ALKPHOS 88  BILITOT 0.5  PROT 7.3  ALBUMIN 4.0   No results for input(s): LIPASE, AMYLASE in the last 72 hours. CBC:  Recent Labs  03/26/15 0301 03/27/15 0512  WBC 10.9 11.1*  HGB 11.2* 11.7*  HCT 33.7* 34.7*  MCV 82.9 82.9  PLT 298 318   Cardiac Enzymes:  Recent Labs  03/25/15 1919 03/25/15 2255 03/26/15 0301  TROPONINI <0.03 <0.03 <0.03   BNP: Invalid input(s): POCBNP D-Dimer: No results for input(s): DDIMER in the last 72 hours. Hemoglobin A1C: No results for input(s): HGBA1C in the last 72 hours. Fasting Lipid Panel: No results for input(s): CHOL, HDL, LDLCALC, TRIG, CHOLHDL, LDLDIRECT in the last 72  hours. Thyroid Function Tests:  Recent Labs  03/25/15 1919  TSH 6.046*   Anemia Panel: No results for input(s): VITAMINB12, FOLATE, FERRITIN, TIBC, IRON, RETICCTPCT in the last 72 hours.  Dg Chest Port 1 View  03/25/2015   CLINICAL DATA:  Shortness of breath since Monday.  Nonsmoker.  EXAM: PORTABLE CHEST - 1 VIEW  COMPARISON:  Chest CT 03/04/2015  FINDINGS: There are emphysematous changes throughout the lungs. Asymmetric density is identified in the left lung base, raising the question of early infiltrate. Heart size is accentuated by the technique.  IMPRESSION: 1. Emphysematous changes. 2. Asymmetric density in the left lung raising the question of early infiltrate.   Electronically Signed   By: Nolon Nations M.D.   On: 03/25/2015 15:48     Echo normal left ventricular function with moderate mitral regurgitation  TELEMETRY: Normal sinus rhythm:  ASSESSMENT AND PLAN:  Principal Problem:   Atrial fibrillation with rapid ventricular response    74 year old female with history of paroxysmal atrial fibrillation, presents with recent diverticulitis, black tarry stool and anemia, with episode of atrial fibrillation with a rapid ventricular rate converted to sinus rhythm with Cardizem bolus, currently in sinus rhythm. Patient started on  sotalol 80 mg twice a day. Xarelto has been held. ECG shows mild prolongation of QT interval.  Recommendations  1. Continue current medications 2. Continue sotalol for rhythm control 3. Continue to hold Xarelto for now. Once GI status is resolved, consider restarting apixaban for stroke prevention which has a lower GI bleeding rate. This can be done as an outpatient. 4. Repeat ECG in a.m. to evaluate QT interval   Julie Klugh, MD, PhD, [facility] 03/27/2015 11:55 AM

## 2015-03-27 NOTE — Progress Notes (Signed)
[place]  PROGRESS NOTE Date of Admission:  03/25/2015     ID: Julie Mcmillan is a 74 y.o. female with  A fib with RVT, PNA, tarry stools, recent diverticulitis Principal Problem:   Atrial fibrillation with rapid ventricular response   Subjective: Seen by GI and cards. Feels a little better, still with cough and on O2. Less DOE - is getting up and about some Has had BM with No melena.   ROS  Eleven systems are reviewed and negative except per hpi  Medications:  Antibiotics Given (last 72 hours)    Date/Time Action Medication Dose   03/25/15 2200 Given   levofloxacin (LEVAQUIN) tablet 500 mg 500 mg   03/26/15 0929 Given   levofloxacin (LEVAQUIN) tablet 500 mg 500 mg   03/27/15 0932 Given   levofloxacin (LEVAQUIN) tablet 500 mg 500 mg     . diltiazem  20 mg Intravenous Once  . hydrALAZINE  25 mg Oral TID  . levofloxacin  500 mg Oral Daily  . levothyroxine  150 mcg Oral QAC breakfast  . losartan  100 mg Oral Daily  . metFORMIN  500 mg Oral BID WC  . metoprolol succinate  50 mg Oral Daily  . multivitamin with minerals  1 tablet Oral Daily  . pantoprazole  40 mg Oral Daily  . sodium chloride  3 mL Intravenous Q12H  . sotalol  80 mg Oral Q12H  . venlafaxine XR  150 mg Oral Q lunch  . venlafaxine XR  37.5 mg Oral Q lunch    Objective: Vital signs in last 24 hours: Temp:  [97.7 F (36.5 C)-98.6 F (37 C)] 97.7 F (36.5 C) (06/26 0507) Pulse Rate:  [82-87] 87 (06/26 0507) Resp:  [18] 18 (06/26 0507) BP: (140-152)/(64-66) 140/64 mmHg (06/26 0507) SpO2:  [88 %-97 %] 94 % (06/26 0716) Weight:  [68.675 kg (151 lb 6.4 oz)] 68.675 kg (151 lb 6.4 oz) (06/26 0507) General appearance: alert Head: Normocephalic, without obvious abnormality, atraumatic Eyes: conjunctivae/corneas clear. PERRL, EOM's intact. Fundi benign. Throat: lips, mucosa, and tongue normal; teeth and gums normal Neck: no adenopathy, no carotid bruit, no JVD, supple, symmetrical, trachea midline and  thyroid not enlarged, symmetric, no tenderness/mass/nodules Resp: crackles bb Chest wall: no tenderness Cardio: irregularly irregular rhythm GI: soft, non-tender; bowel sounds normal; no masses,  no organomegaly Extremities: extremities normal, atraumatic, no cyanosis or edema Skin: Skin color, texture, turgor normal. No rashes or lesions Neurologic: Alert and oriented X 3, normal strength and tone. Normal symmetric reflexes. Normal coordination and gait  Lab Results  Recent Labs  03/26/15 0301 03/27/15 0512  WBC 10.9 11.1*  HGB 11.2* 11.7*  HCT 33.7* 34.7*  NA 140 138  K 4.3 4.0  CL 108 104  CO2 27 26  BUN 11 11  CREATININE 0.84 0.76    Microbiology:  No results found for this or any previous visit.  Studies/Results: Dg Chest Port 1 View  03/25/2015   CLINICAL DATA:  Shortness of breath since Monday.  Nonsmoker.  EXAM: PORTABLE CHEST - 1 VIEW  COMPARISON:  Chest CT 03/04/2015  FINDINGS: There are emphysematous changes throughout the lungs. Asymmetric density is identified in the left lung base, raising the question of early infiltrate. Heart size is accentuated by the technique.  IMPRESSION: 1. Emphysematous changes. 2. Asymmetric density in the left lung raising the question of early infiltrate.   Electronically Signed   By: Nolon Nations M.D.   On: 03/25/2015 15:48   Echo  -  Left ventricle: Systolic function was normal. The estimated ejection fraction was in the range of 55% to 60%. - Aortic valve: There was mild regurgitation. Valve area (Vmax): 1.92 cm^2. - Mitral valve: There was moderate regurgitation. - Left atrium: The atrium was mildly dilated. - Right atrium: The atrium was mildly dilated. - Tricuspid valve: There was moderate regurgitation.  Impressions:  - The right ventricular systolic pressure was increased consistent with moderate pulmonary hypertension  Assessment/Plan: Atrial fibrillation with rapid ventricular response - HR stable -  Responded to 2 Cardizem injection by ER, Continue on telemetry. - Neg troponin, TSH slightly elevated - cardiology consult.- hold xarelto, started sotalol  She was already taking Xarelto - consider change to eliquis if restarted on anticoaglation.  * Black stool and diarrhea - she had BM today with no blood or melena  -  patient has been recently treated for diverticulitis with 10 days' course of ciprofloxacin and Flagyl.  hold her Xarelto - CT abd pelvis  reveiwed from last week  GI consult appreciated . WIll follow hgb (stable) \  * Diabetes She takes metformin will continue that  * Hypothyroidism  She is on levothyroxine and continue that.   * GERD Continue PPI.  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 Check sputum cx . [place], [place]   03/27/2015, 10:47 AM

## 2015-03-28 LAB — BASIC METABOLIC PANEL
ANION GAP: 7 (ref 5–15)
BUN: 14 mg/dL (ref 6–20)
CHLORIDE: 105 mmol/L (ref 101–111)
CO2: 27 mmol/L (ref 22–32)
Calcium: 9 mg/dL (ref 8.9–10.3)
Creatinine, Ser: 0.82 mg/dL (ref 0.44–1.00)
GFR calc non Af Amer: >60 mL/min (ref >60)
Glucose, Bld: 103 mg/dL — ABNORMAL HIGH (ref 65–99)
Potassium: 4 mmol/L (ref 3.5–5.1)
Sodium: 139 mmol/L (ref 135–145)

## 2015-03-28 LAB — CBC
HEMATOCRIT: 35.9 % (ref 35.0–47.0)
Hemoglobin: 12 g/dL (ref 12.0–16.0)
MCH: 27.7 pg (ref 26.0–34.0)
MCHC: 33.3 g/dL (ref 32.0–36.0)
MCV: 83.1 fL (ref 80.0–100.0)
PLATELETS: 342 10*3/uL (ref 150–440)
RBC: 4.32 MIL/uL (ref 3.80–5.20)
RDW: 14.5 % (ref 11.5–14.5)
WBC: 12.2 10*3/uL — AB (ref 3.6–11.0)

## 2015-03-28 MED ORDER — CHOLESTYRAMINE 4 G PO PACK
4.0000 g | PACK | Freq: Every morning | ORAL | Status: DC
  Administered 2015-03-29: 4 g via ORAL
  Filled 2015-03-28 (×2): qty 1

## 2015-03-28 MED ORDER — LOPERAMIDE HCL 2 MG PO CAPS
2.0000 mg | ORAL_CAPSULE | ORAL | Status: DC | PRN
  Administered 2015-03-28: 2 mg via ORAL
  Filled 2015-03-28: qty 1

## 2015-03-28 MED ORDER — PNEUMOCOCCAL VAC POLYVALENT 25 MCG/0.5ML IJ INJ
0.5000 mL | INJECTION | INTRAMUSCULAR | Status: AC
  Administered 2015-03-29: 0.5 mL via INTRAMUSCULAR
  Filled 2015-03-28: qty 0.5

## 2015-03-28 NOTE — Progress Notes (Signed)
[place]  PROGRESS NOTE Date of Admission:  03/25/2015     HPI: Julie Mcmillan is a 74 y.o. female admitted for  A fib with RVR, tarry stools, recent diverticulitis Principal Problem:   Atrial fibrillation with rapid ventricular response   Subjective: Seen by GI and cards. Feels a little better, still c/o intermittent palpitations. Less DOE, she is getting up and about some. No melena for the past two days.  ROS  Eleven systems are reviewed and negative except per hpi  Medications:  Antibiotics Given (last 72 hours)    Date/Time Action Medication Dose   03/25/15 2200 Given   levofloxacin (LEVAQUIN) tablet 500 mg 500 mg   03/26/15 0929 Given   levofloxacin (LEVAQUIN) tablet 500 mg 500 mg   03/27/15 0932 Given   levofloxacin (LEVAQUIN) tablet 500 mg 500 mg   03/28/15 1106 Given   levofloxacin (LEVAQUIN) tablet 500 mg 500 mg     . cholestyramine  4 g Oral q morning - 10a  . hydrALAZINE  25 mg Oral TID  . levofloxacin  500 mg Oral Daily  . levothyroxine  150 mcg Oral QAC breakfast  . losartan  100 mg Oral Daily  . metFORMIN  500 mg Oral BID WC  . metoprolol succinate  50 mg Oral Daily  . multivitamin with minerals  1 tablet Oral Daily  . pantoprazole  40 mg Oral Daily  . [START ON 03/29/2015] pneumococcal 23 valent vaccine  0.5 mL Intramuscular Tomorrow-1000  . sodium chloride  3 mL Intravenous Q12H  . sotalol  80 mg Oral Q12H  . venlafaxine XR  150 mg Oral Q lunch  . venlafaxine XR  37.5 mg Oral Q lunch    Objective: Vital signs in last 24 hours: Temp:  [97.9 F (36.6 C)-99 F (37.2 C)] 98.4 F (36.9 C) (06/27 1150) Pulse Rate:  [25-73] 73 (06/27 1150) Resp:  [17-23] 17 (06/27 1150) BP: (118-140)/(56-59) 140/59 mmHg (06/27 1150) SpO2:  [92 %-98 %] 95 % (06/27 1150) FiO2 (%):  [1 %] 1 % (06/27 0808) Weight:  [69.945 kg (154 lb 3.2 oz)] 69.945 kg (154 lb 3.2 oz) (06/27 0631) General appearance: alert, oriented x 3.  Head: Normocephalic, without obvious  abnormality, atraumatic Neck: no adenopathy, no JVD, supple, symmetrical, trachea midline and thyroid not enlarged. Resp: minimal bibasilar crackles Cardio: regular rate and rhythm GI: soft, non-tender; bowel sounds normal; no masses Extremities: no cyanosis or edema Skin: Skin color, texture, turgor normal. No rashes or lesions  Lab Results  Recent Labs  03/27/15 0512 03/28/15 0350  WBC 11.1* 12.2*  HGB 11.7* 12.0  HCT 34.7* 35.9  NA 138 139  K 4.0 4.0  CL 104 105  CO2 26 27  BUN 11 14  CREATININE 0.76 0.82    Microbiology:  No results found for this or any previous visit.  Studies/Results: No results found. Echo  - Left ventricle: Systolic function was normal. The estimated ejection fraction was in the range of 55% to 60%. - Aortic valve: There was mild regurgitation. Valve area (Vmax): 1.92 cm^2. - Mitral valve: There was moderate regurgitation. - Left atrium: The atrium was mildly dilated. - Right atrium: The atrium was mildly dilated. - Tricuspid valve: There was moderate regurgitation.  Impressions:  - The right ventricular systolic pressure was increased consistent with moderate pulmonary hypertension  Assessment/Plan:  Paroxysmal atrial fibrillation with rapid ventricular response in the setting of diverticulitis and GI bleed. Patient reverted to NSR after Cardizem bolus  in ED. Patient was started on Betapace 80 mg twice a day, remains in sinus rhythm. Xarelto on hold for GI bleed. After GI bleed stabilizes, consider restarting apixaban for stroke prevention, per cardiology. Apixaban has a lower GI bleeding compared to Xarelto. Cardiology input appreciated.    Lower GI bleed: Xarelto on hold. No blood or melena.  GI consult appreciated .Monitor H/H. Patient has been recently treated for diverticulitis with 10 days' course of ciprofloxacin and Flagyl.   Diabetes: continue metformin   Hypothyroidism: continue levothyroxine   GERD: continue  PPI.  Pneumonia - recent CT showed likely pulmonary fibrosis with scarring, honeycombing and bronchiectasis - followed by pulm at [facility]. Shall continue Levaquin for now. Check sputum cx. . Julie Mcmillan   03/28/2015, 1:08 PM

## 2015-03-28 NOTE — Consult Note (Signed)
Julie Mcmillan reports three episodes of loose diarrhea beginning this am prior to breakfast.  She denies seeing any blood. No nausea or vomiting. Due to recent abx use we will order c. Diff studies and protocol.  Full progress note pending.

## 2015-03-28 NOTE — Progress Notes (Signed)
[facility] Cardiology  SUBJECTIVE: I'm feeling better   Filed Vitals:   03/27/15 0716 03/27/15 1210 03/27/15 1925 03/28/15 0631  BP:  148/69 134/57 118/56  Pulse:  73 71 25  Temp:  97.7 F (36.5 C) 99 F (37.2 C) 97.9 F (36.6 C)  TempSrc:   Oral Oral  Resp:  _0 Height:      Weight:    69.945 kg (154 lb 3.2 oz)  SpO2: 94% 89% 92% 98%     Intake/Output Summary (Last 24 hours) at 03/28/15 0805 Last data filed at 03/27/15 1824  Gross per 24 hour  Intake    600 ml  Output      0 ml  Net    600 ml      PHYSICAL EXAM  General: Well developed, well nourished, in no acute distress HEENT:  Normocephalic and atramatic Neck:  No JVD.  Lungs: Clear bilaterally to auscultation and percussion. Heart: HRRR . Normal S1 and S2 without gallops or murmurs.  Abdomen: Bowel sounds are positive, abdomen soft and non-tender  Msk:  Back normal, normal gait. Normal strength and tone for age. Extremities: No clubbing, cyanosis or edema.   Neuro: Alert and oriented X 3. Psych:  Good affect, responds appropriately   LABS: Basic Metabolic Panel:  Recent Labs  03/27/15 0512 03/28/15 0350  NA 138 139  K 4.0 4.0  CL 104 105  CO2 26 27  GLUCOSE 107* 103*  BUN 11 14  CREATININE 0.76 0.82  CALCIUM 8.6* 9.0   Liver Function Tests:  Recent Labs  03/25/15 1450  AST 24  ALT 15  ALKPHOS 88  BILITOT 0.5  PROT 7.3  ALBUMIN 4.0   No results for input(s): LIPASE, AMYLASE in the last 72 hours. CBC:  Recent Labs  03/27/15 0512 03/28/15 0350  WBC 11.1* 12.2*  HGB 11.7* 12.0  HCT 34.7* 35.9  MCV 82.9 83.1  PLT 318 342   Cardiac Enzymes:  Recent Labs  03/25/15 1919 03/25/15 2255 03/26/15 0301  TROPONINI <0.03 <0.03 <0.03   BNP: Invalid input(s): POCBNP D-Dimer: No results for input(s): DDIMER in the last 72 hours. Hemoglobin A1C: No results for input(s): HGBA1C in the last 72 hours. Fasting Lipid Panel: No results for input(s): CHOL, HDL, LDLCALC, TRIG, CHOLHDL,  LDLDIRECT in the last 72 hours. Thyroid Function Tests:  Recent Labs  03/25/15 1919  TSH 6.046*   Anemia Panel: No results for input(s): VITAMINB12, FOLATE, FERRITIN, TIBC, IRON, RETICCTPCT in the last 72 hours.  No results found.   Echo normal left ventricular function with moderate mitral regurgitation  TELEMETRY: Normal sinus rhythm:  ASSESSMENT AND PLAN:  Principal Problem:   Atrial fibrillation with rapid ventricular response    74 year old female with paroxysmal atrial fibrillation, since with atrial fibrillation with a rapid ventricular rate in the setting of diverticulitis and GI bleed, converted to sinus rhythm after Cardizem bolus. Patient was started on Betapace 80 mg twice a day, remains in sinus rhythm. ECG reveals minimal QT prolongation on sotalol.  Recommendations  1. Continue current therapy 2. Continue Betapace for rhythm control 3. Hold Xarelto. Once GI status stabilized, consider restarting apixaban for stroke prevention which has a lower GI bleeding compared to Xarelto.    Isaias Cowman, MD, PhD, [facility] 03/28/2015 8:05 AM

## 2015-03-28 NOTE — Progress Notes (Signed)
Per Md Neldon Mc it is OK to order pt's diarrhea meds.  Pt has chronic diarrhea lifelong

## 2015-03-28 NOTE — Progress Notes (Signed)
Per MD Glendon Axe this patient has a long history of diarrhea, has been checked before and takes binders at home to control diarrhea, no need to check for c-diff

## 2015-03-28 NOTE — Consult Note (Signed)
GI Inpatient Follow-up Note  Patient Identification: Julie Mcmillan is a 74 y.o. female with recurrent diverticulitis and recent melena on Xarelto  Subjective: Julie Mcmillan reports three episodes of loose diarrhea beginning this am prior to breakfast.  She denies seeing any blood, nausea or vomiting.  She has a significant history of chronic diarrhea.    Scheduled Inpatient Medications:  . cholestyramine  4 g Oral q morning - 10a  . hydrALAZINE  25 mg Oral TID  . levofloxacin  500 mg Oral Daily  . levothyroxine  150 mcg Oral QAC breakfast  . losartan  100 mg Oral Daily  . metFORMIN  500 mg Oral BID WC  . metoprolol succinate  50 mg Oral Daily  . multivitamin with minerals  1 tablet Oral Daily  . pantoprazole  40 mg Oral Daily  . [START ON 03/29/2015] pneumococcal 23 valent vaccine  0.5 mL Intramuscular Tomorrow-1000  . sodium chloride  3 mL Intravenous Q12H  . sotalol  80 mg Oral Q12H  . venlafaxine XR  150 mg Oral Q lunch  . venlafaxine XR  37.5 mg Oral Q lunch    Continuous Inpatient Infusions:     PRN Inpatient Medications:  acetaminophen, loperamide  Review of Systems: Constitutional: Weight is stable.  Eyes: No changes in vision. ENT: No oral lesions, sore throat.  GI: see HPI.  Heme/Lymph: No easy bruising.  CV: No chest pain.  GU: No hematuria.  Integumentary: No rashes.  Neuro: No headaches.  Psych: No depression/anxiety.  Endocrine: No heat/cold intolerance.  Allergic/Immunologic: No urticaria.  Resp: No cough, SOB.  Musculoskeletal: No joint swelling.    Physical Examination: BP 140/59 mmHg  Pulse 73  Temp(Src) 98.4 F (36.9 C) (Oral)  Resp 17  Ht _0  (1.626 m)  Wt 69.945 kg (154 lb 3.2 oz)  BMI 26.46 kg/m2  SpO2 95% Gen: NAD, alert and oriented x 4 HEENT: PEERLA, EOMI, Neck: supple, no JVD or thyromegaly Chest: CTA bilaterally, no wheezes, crackles, or other adventitious sounds CV: RRR, no m/g/c/r Abd: soft, NT, ND, +BS in all four quadrants;  no HSM, guarding, ridigity, or rebound tenderness Ext: no edema, well perfused with 2+ pulses, Skin: no rash or lesions noted Lymph: no LAD  Data: Lab Results  Component Value Date   WBC 12.2* 03/28/2015   HGB 12.0 03/28/2015   HCT 35.9 03/28/2015   MCV 83.1 03/28/2015   PLT 342 03/28/2015    Recent Labs Lab 03/26/15 0301 03/27/15 0512 03/28/15 0350  HGB 11.2* 11.7* 12.0   Lab Results  Component Value Date   NA 139 03/28/2015   K 4.0 03/28/2015   CL 105 03/28/2015   CO2 27 03/28/2015   BUN 14 03/28/2015   CREATININE 0.82 03/28/2015   Lab Results  Component Value Date   ALT 15 03/25/2015   AST 24 03/25/2015   ALKPHOS 88 03/25/2015   BILITOT 0.5 03/25/2015    Recent Labs Lab 03/25/15 1450  APTT 37*  INR 1.46   Assessment/Plan: Julie Mcmillan is a 74 y.o. female with recurrent diverticulitis and recent melena on Xarelto  Recommendations: We recognize and appreciate that Julie Mcmillan has a strong history of chronic diarrhea, however given the recent abx use, per Dr. Vira Agar we strongly recommend stool studies for c.diff, because we can not be sure that it is not c. diff unless we test it. Please call with questions or concerns.  Salvadore Farber, PA-C  I personally performed these services.

## 2015-03-29 MED ORDER — SOTALOL HCL 80 MG PO TABS: 80.0000 mg | ORAL_TABLET | Freq: Two times a day (BID) | ORAL | Status: DC

## 2015-03-29 MED ORDER — GLIMEPIRIDE 2 MG PO TABS: 2.0000 mg | ORAL_TABLET | Freq: Every day | ORAL | Status: DC

## 2015-03-29 MED ORDER — PANTOPRAZOLE SODIUM 40 MG PO TBEC: 40.0000 mg | DELAYED_RELEASE_TABLET | Freq: Every day | ORAL | Status: AC

## 2015-03-29 MED ORDER — VENLAFAXINE HCL ER 150 MG PO CP24: 150.0000 mg | ORAL_CAPSULE | Freq: Every day | ORAL | Status: DC

## 2015-03-29 NOTE — Discharge Instructions (Signed)
1. Stop Metformin for diarrhea. Start Amaryl 2 mg daily. Shall consider switching Effexor to alternative SSRI like Lexapro as out patient. Follow up with me in the office in one week.  2. After GI bleed stabilizes, plan is to start apixaban for stroke prevention, per cardiology. Patient will follow up with GI and cardiology as out patient.

## 2015-03-29 NOTE — Discharge Summary (Signed)
Physician Discharge Summary  Patient ID: Julie Mcmillan MRN: [id number] DOB/AGE: 12-30-1940 74 y.o.  Admit date: 03/25/2015 Discharge date: 03/29/2015  Admission Diagnoses: Atrial fibrillation with rapid ventricular response  Discharge Diagnoses:  Principal Problem:   Atrial fibrillation with rapid ventricular response  Chronic medical problems: hypertension, anxiety, osteoporosis, hypothyroidism, gastroesophageal reflux disease, chronic diarrhea, diabetes and interstitial lung disease.   Discharged Condition: stable  Hospital Course: 74 year old lady with h/o hypertension, anxiety, osteoporosis, hypothyroidism, gastroesophageal reflux disease, diabetes and interstitial lung disease. Patient presented with dizziness and SOB with minimal walking. Recent episode of black tarry stool. Patient was recently treated for diverticulitis with 10 day course of Ciprofloxacillin and Flagyl. Patient was noted to be in atrial fibrillation with rapid ventricular response in the ED. She reverted to normal sinus rhythm after Cardizem bolus in ED. Xarelto was held for GI bleed. Patient was evaluated by cardiology and started on Betapace 80 mg twice a day. No further episodes of GI bleed. H/H is stable. She received PPI, Protonix during her stay. Stool C. difficile obtained for diarrhea, was negative. Cholestyramine was continued. Levothyroxine and Metformin were continued for hypothyroidism and diabetes. Dizziness, palpitations and dyspnea have resolved. She has remained in sinus rhythm. After GI bleed stabilizes, plan is to start apixaban for stroke prevention, per cardiology. Apixaban has a lower GI bleeding compared to Xarelto. Patient will follow up with GI and cardiology as out patient. Shall stop Metformin for diarrhea. Start Amaryl 2 mg daily. Shall consider switching Effexor to alternative SSRI like Lexapro as out patient. Follow up with me in the office in one week.   Consults: cardiology and  GI  Discharge Exam: Blood pressure 112/48, pulse 74, temperature 98.5 F (36.9 C), temperature source Oral, resp. rate 17, height _0  (1.626 m), weight 71.351 kg (157 lb 4.8 oz), SpO2 96 %. General appearance: alert, oriented x 3.  Head: Normocephalic, without obvious abnormality, atraumatic Neck: no adenopathy, no JVD, supple, symmetrical, trachea midline and thyroid not enlarged. Resp: minimal bibasilar crackles Cardio: regular rate and rhythm GI: soft, non-tender; bowel sounds normal; no masses Extremities: no cyanosis or edema Skin: Skin color, texture, turgor normal. No rashes or lesions  Disposition: 01-Home or Self Care     Medication List    STOP taking these medications        lansoprazole 15 MG capsule  Commonly known as:  PREVACID  Replaced by:  pantoprazole 40 MG tablet     metFORMIN 500 MG tablet  Commonly known as:  GLUCOPHAGE     omeprazole 20 MG capsule  Commonly known as:  PRILOSEC     rivaroxaban 20 MG Tabs tablet  Commonly known as:  XARELTO     venlafaxine 100 MG tablet  Commonly known as:  EFFEXOR  Replaced by:  venlafaxine XR 150 MG 24 hr capsule  You also have another medication with the same name that you need to continue taking as instructed.      TAKE these medications        acetaminophen 325 MG tablet  Commonly known as:  TYLENOL  Take 650 mg by mouth as needed.     CALCIUM PO  Take by mouth.     cholestyramine 4 GM/DOSE powder  Commonly known as:  QUESTRAN  Take 4 g by mouth every morning.     ferrous fumarate 325 (106 FE) MG Tabs tablet  Commonly known as:  HEMOCYTE - 106 mg FE  Take 1 tablet by mouth  daily. Lunchtime     glimepiride 2 MG tablet  Commonly known as:  AMARYL  Take 1 tablet (2 mg total) by mouth daily with breakfast.     hydrALAZINE 25 MG tablet  Commonly known as:  APRESOLINE  Take 25 mg by mouth 3 (three) times daily.     levothyroxine 150 MCG tablet  Commonly known as:  SYNTHROID, LEVOTHROID  Take  150 mcg by mouth daily before breakfast.     loperamide 2 MG tablet  Commonly known as:  IMODIUM A-D  Take 2 mg by mouth 4 (four) times daily as needed for diarrhea or loose stools.     losartan 100 MG tablet  Commonly known as:  COZAAR  Take 100 mg by mouth daily. Lunchtime     metoprolol succinate 100 MG 24 hr tablet  Commonly known as:  TOPROL-XL  Take 100 mg by mouth daily. AM     MULTI-VITAMIN DAILY PO  Take 1 tablet by mouth daily.     pantoprazole 40 MG tablet  Commonly known as:  PROTONIX  Take 1 tablet (40 mg total) by mouth daily.     sotalol 80 MG tablet  Commonly known as:  BETAPACE  Take 1 tablet (80 mg total) by mouth every 12 (twelve) hours.     venlafaxine 37.5 MG tablet  Commonly known as:  EFFEXOR  Take 37.5 mg by mouth daily. Lunchtime     venlafaxine XR 150 MG 24 hr capsule  Commonly known as:  EFFEXOR-XR  Take 1 capsule (150 mg total) by mouth daily with lunch.         Signed: Melbourne Jakubiak 03/29/2015, 1:08 PM

## 2015-03-29 NOTE — Progress Notes (Signed)
A&O. Independent. No complaints through the night.  SATs int 90s  On Room air. Afib on tele.

## 2015-03-29 NOTE — Care Management (Signed)
Presents from home.  Independent in all adls.No dc needs identified during progression rounds.

## 2015-03-29 NOTE — Progress Notes (Signed)
Patient is discharge in a stable condition , still having slight sob on exertion , summary given , left with son

## 2015-05-11 ENCOUNTER — Ambulatory Visit: Admit: 2015-05-11 | Payer: PPO | Admitting: Ophthalmology

## 2015-05-11 SURGERY — PHACOEMULSIFICATION, CATARACT, WITH IOL INSERTION
Anesthesia: Topical | Laterality: Left

## 2015-06-28 ENCOUNTER — Emergency Department: Payer: Medicare Other

## 2015-06-28 ENCOUNTER — Encounter: Payer: Self-pay | Admitting: *Deleted

## 2015-06-28 ENCOUNTER — Emergency Department
Admission: EM | Admit: 2015-06-28 | Discharge: 2015-06-28 | Disposition: A | Discharge status: Home / Self Care | Payer: Medicare Other | Attending: Emergency Medicine | Admitting: Emergency Medicine

## 2015-06-28 DIAGNOSIS — I10 Essential (primary) hypertension: Secondary | ICD-10-CM | POA: Diagnosis not present

## 2015-06-28 DIAGNOSIS — K625 Hemorrhage of anus and rectum: Secondary | ICD-10-CM | POA: Diagnosis present

## 2015-06-28 DIAGNOSIS — K5733 Diverticulitis of large intestine without perforation or abscess with bleeding: Secondary | ICD-10-CM | POA: Diagnosis not present

## 2015-06-28 DIAGNOSIS — Z79899 Other long term (current) drug therapy: Secondary | ICD-10-CM | POA: Insufficient documentation

## 2015-06-28 LAB — CBC WITH DIFFERENTIAL/PLATELET
Basophils Absolute: 0.1 10*3/uL (ref 0–0.1)
Basophils Relative: 1 %
Eosinophils Absolute: 0.1 10*3/uL (ref 0–0.7)
Eosinophils Relative: 1 %
HCT: 37.2 % (ref 35.0–47.0)
HEMOGLOBIN: 12.3 g/dL (ref 12.0–16.0)
LYMPHS ABS: 1.4 10*3/uL (ref 1.0–3.6)
LYMPHS PCT: 10 %
MCH: 27.1 pg (ref 26.0–34.0)
MCHC: 33.1 g/dL (ref 32.0–36.0)
MCV: 81.8 fL (ref 80.0–100.0)
Monocytes Absolute: 2 10*3/uL — ABNORMAL HIGH (ref 0.2–0.9)
Monocytes Relative: 13 %
NEUTROS PCT: 75 %
Neutro Abs: 11.4 10*3/uL — ABNORMAL HIGH (ref 1.4–6.5)
Platelets: 270 10*3/uL (ref 150–440)
RBC: 4.55 MIL/uL (ref 3.80–5.20)
RDW: 14.5 % (ref 11.5–14.5)
WBC: 15 10*3/uL — AB (ref 3.6–11.0)

## 2015-06-28 LAB — LIPASE, BLOOD: Lipase: 16 U/L — ABNORMAL LOW (ref 22–51)

## 2015-06-28 LAB — URINALYSIS COMPLETE WITH MICROSCOPIC (ARMC ONLY)
BACTERIA UA: NONE SEEN
BILIRUBIN URINE: NEGATIVE
Glucose, UA: NEGATIVE mg/dL
Ketones, ur: NEGATIVE mg/dL
LEUKOCYTES UA: NEGATIVE
NITRITE: NEGATIVE
PH: 6 (ref 5.0–8.0)
Protein, ur: NEGATIVE mg/dL
Specific Gravity, Urine: 1.003 — ABNORMAL LOW (ref 1.005–1.030)

## 2015-06-28 LAB — COMPREHENSIVE METABOLIC PANEL
ALBUMIN: 3.5 g/dL (ref 3.5–5.0)
ALT: 13 U/L — AB (ref 14–54)
ANION GAP: 12 (ref 5–15)
AST: 36 U/L (ref 15–41)
Alkaline Phosphatase: 83 U/L (ref 38–126)
BUN: 9 mg/dL (ref 6–20)
CHLORIDE: 96 mmol/L — AB (ref 101–111)
CO2: 22 mmol/L (ref 22–32)
Calcium: 8.4 mg/dL — ABNORMAL LOW (ref 8.9–10.3)
Creatinine, Ser: 0.8 mg/dL (ref 0.44–1.00)
GFR calc non Af Amer: >60 mL/min (ref >60)
Glucose, Bld: 180 mg/dL — ABNORMAL HIGH (ref 65–99)
Potassium: 3 mmol/L — ABNORMAL LOW (ref 3.5–5.1)
SODIUM: 130 mmol/L — AB (ref 135–145)
Total Bilirubin: 0.8 mg/dL (ref 0.3–1.2)
Total Protein: 6.6 g/dL (ref 6.5–8.1)

## 2015-06-28 LAB — PROTIME-INR
INR: 1.34
Prothrombin Time: 16.8 seconds — ABNORMAL HIGH (ref 11.4–15.0)

## 2015-06-28 LAB — APTT: APTT: 36 s (ref 24–36)

## 2015-06-28 MED ORDER — OXYCODONE-ACETAMINOPHEN 5-325 MG PO TABS: 1.0000 | ORAL_TABLET | Freq: Four times a day (QID) | ORAL | Status: DC | PRN

## 2015-06-28 MED ORDER — ONDANSETRON HCL 4 MG/2ML IJ SOLN
4.0000 mg | Freq: Once | INTRAMUSCULAR | Status: AC
  Administered 2015-06-28: 4 mg via INTRAVENOUS
  Filled 2015-06-28: qty 2

## 2015-06-28 MED ORDER — IOHEXOL 240 MG/ML SOLN
25.0000 mL | Freq: Once | INTRAMUSCULAR | Status: AC | PRN
  Administered 2015-06-28: 25 mL via ORAL

## 2015-06-28 MED ORDER — ONDANSETRON 8 MG PO TBDP
8.0000 mg | ORAL_TABLET | ORAL | Status: AC
  Administered 2015-06-28: 8 mg via ORAL
  Filled 2015-06-28: qty 1

## 2015-06-28 MED ORDER — METRONIDAZOLE 500 MG PO TABS: 500.0000 mg | ORAL_TABLET | Freq: Three times a day (TID) | ORAL | Status: DC

## 2015-06-28 MED ORDER — SODIUM CHLORIDE 0.9 % IV BOLUS (SEPSIS)
1000.0000 mL | Freq: Once | INTRAVENOUS | Status: AC
  Administered 2015-06-28: 1000 mL via INTRAVENOUS

## 2015-06-28 MED ORDER — OXYCODONE-ACETAMINOPHEN 5-325 MG PO TABS
1.0000 | ORAL_TABLET | Freq: Once | ORAL | Status: AC
Start: 2015-06-28 — End: 2015-06-28
  Administered 2015-06-28: 1 via ORAL
  Filled 2015-06-28: qty 1

## 2015-06-28 MED ORDER — ONDANSETRON 8 MG PO TBDP: 8.0000 mg | ORAL_TABLET | Freq: Three times a day (TID) | ORAL | Status: DC | PRN

## 2015-06-28 MED ORDER — CIPROFLOXACIN HCL 500 MG PO TABS: 500.0000 mg | ORAL_TABLET | Freq: Two times a day (BID) | ORAL | Status: DC

## 2015-06-28 MED ORDER — METRONIDAZOLE 500 MG PO TABS
500.0000 mg | ORAL_TABLET | Freq: Once | ORAL | Status: AC
Start: 2015-06-28 — End: 2015-06-28
  Administered 2015-06-28: 500 mg via ORAL
  Filled 2015-06-28: qty 1

## 2015-06-28 MED ORDER — CIPROFLOXACIN HCL 500 MG PO TABS
500.0000 mg | ORAL_TABLET | Freq: Once | ORAL | Status: AC
Start: 2015-06-28 — End: 2015-06-28
  Administered 2015-06-28: 500 mg via ORAL
  Filled 2015-06-28: qty 1

## 2015-06-28 MED ORDER — HYDROMORPHONE HCL 1 MG/ML IJ SOLN
1.0000 mg | Freq: Once | INTRAMUSCULAR | Status: AC
  Administered 2015-06-28: 1 mg via INTRAVENOUS
  Filled 2015-06-28: qty 1

## 2015-06-28 MED ORDER — IOHEXOL 300 MG/ML  SOLN
100.0000 mL | Freq: Once | INTRAMUSCULAR | Status: AC | PRN
  Administered 2015-06-28: 100 mL via INTRAVENOUS

## 2015-06-28 MED ORDER — MORPHINE SULFATE (PF) 4 MG/ML IV SOLN
4.0000 mg | Freq: Once | INTRAVENOUS | Status: AC
  Administered 2015-06-28: 4 mg via INTRAVENOUS
  Filled 2015-06-28: qty 1

## 2015-06-28 NOTE — ED Notes (Signed)
Pt to triage via wheelchair. Pt has rectal bleeding and abd pain with distention.  Pt pale.  Sx began yesterday.  Pt alert.

## 2015-06-28 NOTE — ED Provider Notes (Signed)
[facility] Emergency Department Provider Note  ____________________________________________  Time seen: 7:00 PM  I have reviewed the triage vital signs and the nursing notes.   HISTORY  Chief Complaint Rectal Bleeding and Abdominal Pain    HPI Julie Mcmillan is a 74 y.o. female who complains of gradual onset worsening generalized abdominal pain for the past week. She's also had rectal bleeding that started yesterday as bright red blood with associated diarrhea. No fevers but she has had chills since last night. She is also feeling lightheaded whenever she stands up but has not passed out or fallen down. No chest pain or shortness of breath. Has a history of diverticulitis. No prior history of GI bleed.     Past Medical History  Diagnosis Date  . Irregular heart beat   . Hypertension   . IBS (irritable bowel syndrome)   . Shortness of breath dyspnea   . GERD (gastroesophageal reflux disease)   . Arthritis   . Interstitial lung disease   . Seasonal affective disorder      Patient Active Problem List   Diagnosis Date Noted  . Atrial fibrillation with rapid ventricular response 03/25/2015     Past Surgical History  Procedure Laterality Date  . Nissen fundoplication    . Abdominal hysterectomy      partial  . Appendectomy    . Tonsillectomy      and adenoidectomy  . Colonoscopy    . Cardiac catheterization      "20 yrs ago" - all ok  . Cataract extraction w/phaco Right 03/16/2015    Procedure: CATARACT EXTRACTION PHACO AND INTRAOCULAR LENS PLACEMENT (IOC);  Surgeon: Leandrew Koyanagi, MD;  Location: [facility];  Service: Ophthalmology;  Laterality: Right;     Current Outpatient Rx  Name  Route  Sig  Dispense  Refill  . acetaminophen (TYLENOL) 325 MG tablet   Oral   Take 650 mg by mouth as needed.         Marland Kitchen CALCIUM PO   Oral   Take by mouth.         . cholestyramine (QUESTRAN) 4 GM/DOSE powder   Oral   Take 4 g by  mouth every morning.         . ciprofloxacin (CIPRO) 500 MG tablet   Oral   Take 1 tablet (500 mg total) by mouth 2 (two) times daily.   14 tablet   0   . ferrous fumarate (HEMOCYTE - 106 MG FE) 325 (106 FE) MG TABS tablet   Oral   Take 1 tablet by mouth daily. Lunchtime         . glimepiride (AMARYL) 2 MG tablet   Oral   Take 1 tablet (2 mg total) by mouth daily with breakfast.   30 tablet   3     Stop Metformin for diarrhea.   . hydrALAZINE (APRESOLINE) 25 MG tablet   Oral   Take 25 mg by mouth 3 (three) times daily.         Marland Kitchen levothyroxine (SYNTHROID, LEVOTHROID) 150 MCG tablet   Oral   Take 150 mcg by mouth daily before breakfast.         . loperamide (IMODIUM A-D) 2 MG tablet   Oral   Take 2 mg by mouth 4 (four) times daily as needed for diarrhea or loose stools.         Marland Kitchen losartan (COZAAR) 100 MG tablet   Oral   Take 100 mg  by mouth daily. Lunchtime         . metoprolol succinate (TOPROL-XL) 100 MG 24 hr tablet   Oral   Take 100 mg by mouth daily. AM         . metroNIDAZOLE (FLAGYL) 500 MG tablet   Oral   Take 1 tablet (500 mg total) by mouth 3 (three) times daily.   30 tablet   0   . Multiple Vitamin (MULTI-VITAMIN DAILY PO)   Oral   Take 1 tablet by mouth daily.         . ondansetron (ZOFRAN ODT) 8 MG disintegrating tablet   Oral   Take 1 tablet (8 mg total) by mouth every 8 (eight) hours as needed for nausea or vomiting.   20 tablet   0   . oxyCODONE-acetaminophen (ROXICET) 5-325 MG tablet   Oral   Take 1 tablet by mouth every 6 (six) hours as needed for severe pain.   12 tablet   0   . pantoprazole (PROTONIX) 40 MG tablet   Oral   Take 1 tablet (40 mg total) by mouth daily.   90 tablet   1   . sotalol (BETAPACE) 80 MG tablet   Oral   Take 1 tablet (80 mg total) by mouth every 12 (twelve) hours.   60 tablet   0   . venlafaxine (EFFEXOR) 37.5 MG tablet   Oral   Take 37.5 mg by mouth daily. Lunchtime         .  venlafaxine XR (EFFEXOR-XR) 150 MG 24 hr capsule   Oral   Take 1 capsule (150 mg total) by mouth daily with lunch.   30 capsule   0      Allergies Amlodipine and Nexium   Family History  Problem Relation Age of Onset  . Breast cancer Mother   . Diabetes Mellitus II Mother   . Hypothyroidism Mother   . Atrial fibrillation Mother   . Heart attack Father     Social History Social History  Substance Use Topics  . Smoking status: Never Smoker   . Smokeless tobacco: None  . Alcohol Use: No    Review of Systems  Constitutional:   No fever positive chills. No weight changes Eyes:   No blurry vision or double vision.  ENT:   No sore throat. Cardiovascular:   No chest pain. Respiratory:   No dyspnea or cough. Gastrointestinal:   Positive generalized for abdominal pain, without vomiting, positive diarrhea.  Positive BRBPR without melena. Genitourinary:   Negative for dysuria, urinary retention, bloody urine, or difficulty urinating. Musculoskeletal:   Negative for back pain. No joint swelling or pain. Skin:   Negative for rash. Neurological:   Negative for headaches, focal weakness or numbness. Lightheadedness with standing Psychiatric:  No anxiety or depression.   Endocrine:  No hot/cold intolerance, changes in energy, or sleep difficulty.  10-point ROS otherwise negative.  ____________________________________________   PHYSICAL EXAM:  VITAL SIGNS: ED Triage Vitals  Enc Vitals Group     BP 06/28/15 1851 157/65 mmHg     Pulse Rate 06/28/15 1851 90     Resp 06/28/15 1851 22     Temp 06/28/15 1851 98.1 F (36.7 C)     Temp Source 06/28/15 1851 Oral     SpO2 06/28/15 1851 99 %     Weight 06/28/15 1851 150 lb (68.04 kg)     Height 06/28/15 1851 _0  (1.651 m)     Head Cir --  Peak Flow --      Pain Score 06/28/15 1852 10     Pain Loc --      Pain Edu? --      Excl. in [place]? --      Constitutional:   Alert and oriented. Well appearing and in no  distress. Eyes:   No scleral icterus. No conjunctival pallor. PERRL. EOMI ENT   Head:   Normocephalic and atraumatic.   Nose:   No congestion/rhinnorhea. No septal hematoma   Mouth/Throat:   MMM, no pharyngeal erythema. No peritonsillar mass. No uvula shift.   Neck:   No stridor. No SubQ emphysema. No meningismus. Hematological/Lymphatic/Immunilogical:   No cervical lymphadenopathy. Cardiovascular:   RRR. Normal and symmetric distal pulses are present in all extremities. No murmurs, rubs, or gallops. Respiratory:   Normal respiratory effort without tachypnea nor retractions. Breath sounds are clear and equal bilaterally. No wheezes/rales/rhonchi. Gastrointestinal:   Soft with generalized tenderness, worse in the left lower quadrant.. Moderate distention. There is no CVA tenderness.  No rebound, rigidity, or guarding. Rectal exam reveals brown stool that is faintly Hemoccult positive Genitourinary:   deferred Musculoskeletal:   Nontender with normal range of motion in all extremities. No joint effusions.  No lower extremity tenderness.  No edema. Neurologic:   Normal speech and language.  CN 2-10 normal. Motor grossly intact. No pronator drift.  Normal gait. No gross focal neurologic deficits are appreciated.  Skin:    Skin is warm, dry and intact. No rash noted.  No petechiae, purpura, or bullae. Psychiatric:   Mood and affect are normal. Speech and behavior are normal. Patient exhibits appropriate insight and judgment.  ____________________________________________    LABS (pertinent positives/negatives) (all labs ordered are listed, but only abnormal results are displayed) Labs Reviewed  COMPREHENSIVE METABOLIC PANEL - Abnormal; Notable for the following:    Sodium 130 (*)    Potassium 3.0 (*)    Chloride 96 (*)    Glucose, Bld 180 (*)    Calcium 8.4 (*)    ALT 13 (*)    All other components within normal limits  LIPASE, BLOOD - Abnormal; Notable for the following:     Lipase 16 (*)    All other components within normal limits  CBC WITH DIFFERENTIAL/PLATELET - Abnormal; Notable for the following:    WBC 15.0 (*)    Neutro Abs 11.4 (*)    Monocytes Absolute 2.0 (*)    All other components within normal limits  PROTIME-INR - Abnormal; Notable for the following:    Prothrombin Time 16.8 (*)    All other components within normal limits  URINALYSIS COMPLETEWITH MICROSCOPIC ([facility] ONLY) - Abnormal; Notable for the following:    Color, Urine STRAW (*)    APPearance CLEAR (*)    Specific Gravity, Urine 1.003 (*)    Hgb urine dipstick 1+ (*)    Squamous Epithelial / LPF 0-5 (*)    All other components within normal limits  URINE CULTURE  APTT   ____________________________________________   EKG    ____________________________________________    RADIOLOGY  CT abdomen and pelvis reveals sigmoid diverticulitis without perforation or abscess.  ____________________________________________   PROCEDURES   ____________________________________________   INITIAL IMPRESSION / ASSESSMENT AND PLAN / ED COURSE  Pertinent labs & imaging results that were available during my care of the patient were reviewed by me and considered in my medical decision making (see chart for details).  Patient presents with abdominal pain and distention with rectal bleeding  concerning for diverticulitis and possible perforation. We'll give IV fluids pain medicine and nausea medicine while getting a CT scan.  ----------------------------------------- 9:51 PM on 06/28/2015 -----------------------------------------  Workup consistent with diverticulitis, no severe or complicating features. Patient strongly wishes to go home and try to assess outpatient. We'll start her on Cipro Flagyl and Zofran and Percocet. She'll follow up with primary care and GI. No sepsis, well-appearing nontoxic. Tolerating oral  intake     ____________________________________________   FINAL CLINICAL IMPRESSION(S) / ED DIAGNOSES  Final diagnoses:  Diverticulitis of large intestine without perforation or abscess with bleeding      Carrie Mew, MD 06/28/15 2152

## 2015-06-28 NOTE — Discharge Instructions (Signed)
You were prescribed a medication that is potentially sedating. Do not drink alcohol, drive or participate in any other potentially dangerous activities while taking this medication as it may make you sleepy. Do not take this medication with any other sedating medications, either prescription or over-the-counter. If you were prescribed Percocet or Vicodin, do not take these with acetaminophen (Tylenol) as it is already contained within these medications.   Opioid pain medications (or "narcotics") can be habit forming.  Use it as little as possible to achieve adequate pain control.  Do not use or use it with extreme caution if you have a history of opiate abuse or dependence.  If you are on a pain contract with your primary care doctor or a pain specialist, be sure to let them know you were prescribed this medication today from the [facility] Emergency Department.  This medication is intended for your use only - do not give any to anyone else and keep it in a secure place where nobody else, especially children and pets, have access to it.  It will also cause or worsen constipation, so you may want to consider taking an over-the-counter stool softener while you are taking this medication.   Diverticulitis Diverticulitis is inflammation or infection of small pouches in your colon that form when you have a condition called diverticulosis. The pouches in your colon are called diverticula. Your colon, or large intestine, is where water is absorbed and stool is formed. Complications of diverticulitis can include:  Bleeding.  Severe infection.  Severe pain.  Perforation of your colon.  Obstruction of your colon. CAUSES  Diverticulitis is caused by bacteria. Diverticulitis happens when stool becomes trapped in diverticula. This allows bacteria to grow in the diverticula, which can lead to inflammation and infection. RISK FACTORS People with diverticulosis are at risk for diverticulitis. Eating a  diet that does not include enough fiber from fruits and vegetables may make diverticulitis more likely to develop. SYMPTOMS  Symptoms of diverticulitis may include:  Abdominal pain and tenderness. The pain is normally located on the left side of the abdomen, but may occur in other areas.  Fever and chills.  Bloating.  Cramping.  Nausea.  Vomiting.  Constipation.  Diarrhea.  Blood in your stool. DIAGNOSIS  Your health care provider will ask you about your medical history and do a physical exam. You may need to have tests done because many medical conditions can cause the same symptoms as diverticulitis. Tests may include:  Blood tests.  Urine tests.  Imaging tests of the abdomen, including X-rays and CT scans. When your condition is under control, your health care provider may recommend that you have a colonoscopy. A colonoscopy can show how severe your diverticula are and whether something else is causing your symptoms. TREATMENT  Most cases of diverticulitis are mild and can be treated at home. Treatment may include:  Taking over-the-counter pain medicines.  Following a clear liquid diet.  Taking antibiotic medicines by mouth for 7-10 days. More severe cases may be treated at a hospital. Treatment may include:  Not eating or drinking.  Taking prescription pain medicine.  Receiving antibiotic medicines through an IV tube.  Receiving fluids and nutrition through an IV tube.  Surgery. HOME CARE INSTRUCTIONS   Follow your health care provider's instructions carefully.  Follow a full liquid diet or other diet as directed by your health care provider. After your symptoms improve, your health care provider may tell you to change your diet. He or she  may recommend you eat a high-fiber diet. Fruits and vegetables are good sources of fiber. Fiber makes it easier to pass stool.  Take fiber supplements or probiotics as directed by your health care provider.  Only take  medicines as directed by your health care provider.  Keep all your follow-up appointments. SEEK MEDICAL CARE IF:   Your pain does not improve.  You have a hard time eating food.  Your bowel movements do not return to normal. SEEK IMMEDIATE MEDICAL CARE IF:   Your pain becomes worse.  Your symptoms do not get better.  Your symptoms suddenly get worse.  You have a fever.  You have repeated vomiting.  You have bloody or black, tarry stools. MAKE SURE YOU:   Understand these instructions.  Will watch your condition.  Will get help right away if you are not doing well or get worse. Document Released: 06/27/2005 Document Revised: 09/22/2013 Document Reviewed: 08/12/2013 [facility] Patient Information 2015 [place], [place]. This information is not intended to replace advice given to you by your health care provider. Make sure you discuss any questions you have with your health care provider.

## 2015-06-30 LAB — URINE CULTURE

## 2015-07-02 ENCOUNTER — Encounter: Payer: Self-pay | Admitting: Emergency Medicine

## 2015-07-02 ENCOUNTER — Emergency Department
Admission: EM | Admit: 2015-07-02 | Discharge: 2015-07-02 | Discharge status: Against Medical Advice | Payer: Medicare Other | Source: Home / Self Care

## 2015-07-02 DIAGNOSIS — K5720 Diverticulitis of large intestine with perforation and abscess without bleeding: Secondary | ICD-10-CM | POA: Diagnosis not present

## 2015-07-02 LAB — CBC
HCT: 38.6 % (ref 35.0–47.0)
Hemoglobin: 12.4 g/dL (ref 12.0–16.0)
MCH: 26.6 pg (ref 26.0–34.0)
MCHC: 32.1 g/dL (ref 32.0–36.0)
MCV: 82.7 fL (ref 80.0–100.0)
PLATELETS: 369 10*3/uL (ref 150–440)
RBC: 4.67 MIL/uL (ref 3.80–5.20)
RDW: 14.4 % (ref 11.5–14.5)
WBC: 12.8 10*3/uL — ABNORMAL HIGH (ref 3.6–11.0)

## 2015-07-02 LAB — COMPREHENSIVE METABOLIC PANEL
ALT: 16 U/L (ref 14–54)
AST: 21 U/L (ref 15–41)
Albumin: 3.5 g/dL (ref 3.5–5.0)
Alkaline Phosphatase: 82 U/L (ref 38–126)
Anion gap: 7 (ref 5–15)
BUN: 9 mg/dL (ref 6–20)
CHLORIDE: 103 mmol/L (ref 101–111)
CO2: 27 mmol/L (ref 22–32)
CREATININE: 0.81 mg/dL (ref 0.44–1.00)
Calcium: 8.6 mg/dL — ABNORMAL LOW (ref 8.9–10.3)
GFR calc non Af Amer: >60 mL/min (ref >60)
Glucose, Bld: 119 mg/dL — ABNORMAL HIGH (ref 65–99)
Potassium: 3.5 mmol/L (ref 3.5–5.1)
SODIUM: 137 mmol/L (ref 135–145)
Total Bilirubin: 0.7 mg/dL (ref 0.3–1.2)
Total Protein: 6.7 g/dL (ref 6.5–8.1)

## 2015-07-02 LAB — LIPASE, BLOOD: LIPASE: 17 U/L — AB (ref 22–51)

## 2015-07-02 NOTE — ED Notes (Signed)
Called for room, all waiting areas of ED checked, no answer, appears to have left.

## 2015-07-02 NOTE — ED Notes (Signed)
Pt was seen here Tues night and given dx of Diverticulitis. Pt returns today for worsening pain. Per family member pt had diverticuloses that burst in her stomach that gave her abdominal pain and bleeding. Pt denies other symptoms other than abdominal pain, states she feels better than she did on Tuesday but still having a lot of pain and L sided abdominal swelling.

## 2015-07-04 ENCOUNTER — Emergency Department: Payer: Medicare Other

## 2015-07-04 ENCOUNTER — Encounter: Payer: Self-pay | Admitting: Emergency Medicine

## 2015-07-04 ENCOUNTER — Inpatient Hospital Stay
Admission: EM | Admit: 2015-07-04 | Discharge: 2015-07-10 | DRG: 392 | Disposition: A | Discharge status: Home / Self Care | Payer: Medicare Other | Attending: Surgery | Admitting: Surgery

## 2015-07-04 DIAGNOSIS — J84112 Idiopathic pulmonary fibrosis: Secondary | ICD-10-CM | POA: Diagnosis present

## 2015-07-04 DIAGNOSIS — D734 Cyst of spleen: Secondary | ICD-10-CM | POA: Diagnosis present

## 2015-07-04 DIAGNOSIS — J849 Interstitial pulmonary disease, unspecified: Secondary | ICD-10-CM | POA: Diagnosis present

## 2015-07-04 DIAGNOSIS — K449 Diaphragmatic hernia without obstruction or gangrene: Secondary | ICD-10-CM | POA: Diagnosis present

## 2015-07-04 DIAGNOSIS — K589 Irritable bowel syndrome without diarrhea: Secondary | ICD-10-CM | POA: Diagnosis present

## 2015-07-04 DIAGNOSIS — K5732 Diverticulitis of large intestine without perforation or abscess without bleeding: Secondary | ICD-10-CM | POA: Diagnosis present

## 2015-07-04 DIAGNOSIS — K219 Gastro-esophageal reflux disease without esophagitis: Secondary | ICD-10-CM | POA: Diagnosis present

## 2015-07-04 DIAGNOSIS — I4891 Unspecified atrial fibrillation: Secondary | ICD-10-CM | POA: Diagnosis present

## 2015-07-04 DIAGNOSIS — Z79899 Other long term (current) drug therapy: Secondary | ICD-10-CM | POA: Diagnosis not present

## 2015-07-04 DIAGNOSIS — I481 Persistent atrial fibrillation: Secondary | ICD-10-CM | POA: Diagnosis present

## 2015-07-04 DIAGNOSIS — Z888 Allergy status to other drugs, medicaments and biological substances status: Secondary | ICD-10-CM | POA: Diagnosis not present

## 2015-07-04 DIAGNOSIS — R109 Unspecified abdominal pain: Secondary | ICD-10-CM

## 2015-07-04 DIAGNOSIS — I10 Essential (primary) hypertension: Secondary | ICD-10-CM | POA: Diagnosis present

## 2015-07-04 DIAGNOSIS — K5720 Diverticulitis of large intestine with perforation and abscess without bleeding: Principal | ICD-10-CM | POA: Diagnosis present

## 2015-07-04 DIAGNOSIS — K760 Fatty (change of) liver, not elsewhere classified: Secondary | ICD-10-CM | POA: Diagnosis present

## 2015-07-04 DIAGNOSIS — R079 Chest pain, unspecified: Secondary | ICD-10-CM

## 2015-07-04 DIAGNOSIS — Z7901 Long term (current) use of anticoagulants: Secondary | ICD-10-CM

## 2015-07-04 LAB — URINALYSIS COMPLETE WITH MICROSCOPIC (ARMC ONLY)
BACTERIA UA: NONE SEEN
BILIRUBIN URINE: NEGATIVE
Glucose, UA: NEGATIVE mg/dL
KETONES UR: NEGATIVE mg/dL
Nitrite: NEGATIVE
PH: 5 (ref 5.0–8.0)
PROTEIN: NEGATIVE mg/dL
SPECIFIC GRAVITY, URINE: 1.027 (ref 1.005–1.030)

## 2015-07-04 LAB — CBC WITH DIFFERENTIAL/PLATELET
BASOS PCT: 1 %
Basophils Absolute: 0.1 10*3/uL (ref 0–0.1)
EOS ABS: 0.2 10*3/uL (ref 0–0.7)
Eosinophils Relative: 2 %
HCT: 38.8 % (ref 35.0–47.0)
HEMOGLOBIN: 12.7 g/dL (ref 12.0–16.0)
Lymphocytes Relative: 8 %
Lymphs Abs: 1.1 10*3/uL (ref 1.0–3.6)
MCH: 27.1 pg (ref 26.0–34.0)
MCHC: 32.8 g/dL (ref 32.0–36.0)
MCV: 82.7 fL (ref 80.0–100.0)
MONOS PCT: 13 %
Monocytes Absolute: 1.8 10*3/uL — ABNORMAL HIGH (ref 0.2–0.9)
NEUTROS PCT: 76 %
Neutro Abs: 10.4 10*3/uL — ABNORMAL HIGH (ref 1.4–6.5)
PLATELETS: 398 10*3/uL (ref 150–440)
RBC: 4.69 MIL/uL (ref 3.80–5.20)
RDW: 14.4 % (ref 11.5–14.5)
WBC: 13.7 10*3/uL — AB (ref 3.6–11.0)

## 2015-07-04 LAB — COMPREHENSIVE METABOLIC PANEL
ALBUMIN: 3.6 g/dL (ref 3.5–5.0)
ALK PHOS: 86 U/L (ref 38–126)
ALT: 13 U/L — ABNORMAL LOW (ref 14–54)
ANION GAP: 6 (ref 5–15)
AST: 19 U/L (ref 15–41)
BUN: 8 mg/dL (ref 6–20)
CALCIUM: 8.4 mg/dL — AB (ref 8.9–10.3)
CHLORIDE: 105 mmol/L (ref 101–111)
CO2: 27 mmol/L (ref 22–32)
Creatinine, Ser: 0.75 mg/dL (ref 0.44–1.00)
GFR calc Af Amer: >60 mL/min (ref >60)
GFR calc non Af Amer: >60 mL/min (ref >60)
GLUCOSE: 114 mg/dL — AB (ref 65–99)
POTASSIUM: 3.5 mmol/L (ref 3.5–5.1)
Sodium: 138 mmol/L (ref 135–145)
Total Bilirubin: 0.8 mg/dL (ref 0.3–1.2)
Total Protein: 6.9 g/dL (ref 6.5–8.1)

## 2015-07-04 MED ORDER — ONDANSETRON HCL 4 MG/2ML IJ SOLN: 4.0000 mg | Freq: Four times a day (QID) | INTRAMUSCULAR | Status: DC | PRN

## 2015-07-04 MED ORDER — PIPERACILLIN-TAZOBACTAM 3.375 G IVPB
3.3750 g | Freq: Three times a day (TID) | INTRAVENOUS | Status: DC
  Administered 2015-07-05 – 2015-07-10 (×15): 3.375 g via INTRAVENOUS
  Filled 2015-07-04 (×19): qty 50

## 2015-07-04 MED ORDER — ONDANSETRON 4 MG PO TBDP
4.0000 mg | ORAL_TABLET | Freq: Four times a day (QID) | ORAL | Status: DC | PRN
Start: 2015-07-04 — End: 2015-07-10
  Administered 2015-07-09 – 2015-07-10 (×2): 4 mg via ORAL
  Filled 2015-07-04 (×2): qty 1

## 2015-07-04 MED ORDER — PIPERACILLIN-TAZOBACTAM 3.375 G IVPB 30 MIN
3.3750 g | Freq: Once | INTRAVENOUS | Status: AC
  Administered 2015-07-04: 3.375 g via INTRAVENOUS

## 2015-07-04 MED ORDER — HEPARIN SODIUM (PORCINE) 5000 UNIT/ML IJ SOLN
5000.0000 [IU] | Freq: Three times a day (TID) | INTRAMUSCULAR | Status: DC
  Administered 2015-07-04 – 2015-07-10 (×19): 5000 [IU] via SUBCUTANEOUS
  Filled 2015-07-04 (×19): qty 1

## 2015-07-04 MED ORDER — DIPHENHYDRAMINE HCL 50 MG/ML IJ SOLN: 12.5000 mg | Freq: Four times a day (QID) | INTRAMUSCULAR | Status: DC | PRN

## 2015-07-04 MED ORDER — VENLAFAXINE HCL ER 75 MG PO CP24
150.0000 mg | ORAL_CAPSULE | Freq: Every day | ORAL | Status: DC
  Administered 2015-07-05 – 2015-07-10 (×6): 150 mg via ORAL
  Filled 2015-07-04 (×6): qty 2

## 2015-07-04 MED ORDER — METRONIDAZOLE IN NACL 5-0.79 MG/ML-% IV SOLN
500.0000 mg | Freq: Once | INTRAVENOUS | Status: AC
  Administered 2015-07-04: 500 mg via INTRAVENOUS
  Filled 2015-07-04: qty 100

## 2015-07-04 MED ORDER — HYDROMORPHONE 0.3 MG/ML IV SOLN
INTRAVENOUS | Status: DC
  Administered 2015-07-04: 19:00:00 via INTRAVENOUS
  Administered 2015-07-04: 0.779 mg via INTRAVENOUS
  Filled 2015-07-04: qty 25

## 2015-07-04 MED ORDER — NALOXONE HCL 0.4 MG/ML IJ SOLN: 0.4000 mg | INTRAMUSCULAR | Status: DC | PRN

## 2015-07-04 MED ORDER — ACETAMINOPHEN 650 MG RE SUPP: 650.0000 mg | Freq: Four times a day (QID) | RECTAL | Status: DC | PRN

## 2015-07-04 MED ORDER — HYDROMORPHONE 0.3 MG/ML IV SOLN
INTRAVENOUS | Status: DC
  Administered 2015-07-05: 0.4 mg via INTRAVENOUS
  Administered 2015-07-05: 1.19 mg via INTRAVENOUS
  Administered 2015-07-05: 2.19 mg via INTRAVENOUS
  Administered 2015-07-05 (×2): 0.4 mg via INTRAVENOUS
  Administered 2015-07-05: 0 mg via INTRAVENOUS
  Administered 2015-07-05: 0.599 mg via INTRAVENOUS
  Administered 2015-07-06: 0.199 mg via INTRAVENOUS
  Administered 2015-07-06: 0.2 mg via INTRAVENOUS
  Administered 2015-07-06: 0.199 mg via INTRAVENOUS
  Administered 2015-07-06: 0.4 mg via INTRAVENOUS
  Administered 2015-07-06: 0.799 mg via INTRAVENOUS
  Administered 2015-07-06: 0.4 mg via INTRAVENOUS
  Administered 2015-07-07: 0 mg via INTRAVENOUS
  Administered 2015-07-07: 0.4 mg via INTRAVENOUS
  Administered 2015-07-07: 0.2 mg via INTRAVENOUS
  Administered 2015-07-07 (×2): 0 mg via INTRAVENOUS
  Administered 2015-07-08: 0.2 mg via INTRAVENOUS
  Administered 2015-07-08: 0.199 mg via INTRAVENOUS
  Administered 2015-07-08 (×2): 0.2 mg via INTRAVENOUS
  Administered 2015-07-08: 0.6 mg via INTRAVENOUS
  Administered 2015-07-08 – 2015-07-09 (×2): 0 mg via INTRAVENOUS
  Administered 2015-07-09 (×2): 0.2 mg via INTRAVENOUS
  Administered 2015-07-09 (×2): 0 mg via INTRAVENOUS
  Administered 2015-07-09: 0.599 mg via INTRAVENOUS
  Administered 2015-07-10: 0 mg via INTRAVENOUS
  Filled 2015-07-04: qty 25

## 2015-07-04 MED ORDER — HYDROMORPHONE HCL 1 MG/ML IJ SOLN
1.0000 mg | Freq: Once | INTRAMUSCULAR | Status: AC
  Administered 2015-07-04: 1 mg via INTRAVENOUS
  Filled 2015-07-04: qty 1

## 2015-07-04 MED ORDER — LOSARTAN POTASSIUM 50 MG PO TABS
100.0000 mg | ORAL_TABLET | Freq: Every day | ORAL | Status: DC
  Administered 2015-07-04 – 2015-07-10 (×7): 100 mg via ORAL
  Filled 2015-07-04 (×7): qty 2

## 2015-07-04 MED ORDER — SODIUM CHLORIDE 0.9 % IJ SOLN: 9.0000 mL | INTRAMUSCULAR | Status: DC | PRN

## 2015-07-04 MED ORDER — ONDANSETRON HCL 4 MG/2ML IJ SOLN
4.0000 mg | Freq: Once | INTRAMUSCULAR | Status: AC
  Administered 2015-07-04: 4 mg via INTRAVENOUS
  Filled 2015-07-04: qty 2

## 2015-07-04 MED ORDER — METOPROLOL TARTRATE 100 MG PO TABS
100.0000 mg | ORAL_TABLET | Freq: Two times a day (BID) | ORAL | Status: DC
  Administered 2015-07-04 – 2015-07-10 (×12): 100 mg via ORAL
  Filled 2015-07-04 (×12): qty 1

## 2015-07-04 MED ORDER — IOHEXOL 300 MG/ML  SOLN
100.0000 mL | Freq: Once | INTRAMUSCULAR | Status: AC | PRN
  Administered 2015-07-04: 100 mL via INTRAVENOUS

## 2015-07-04 MED ORDER — ACETAMINOPHEN 325 MG PO TABS: 650.0000 mg | ORAL_TABLET | Freq: Four times a day (QID) | ORAL | Status: DC | PRN

## 2015-07-04 MED ORDER — DIPHENHYDRAMINE HCL 12.5 MG/5ML PO ELIX: 12.5000 mg | ORAL_SOLUTION | Freq: Four times a day (QID) | ORAL | Status: DC | PRN

## 2015-07-04 MED ORDER — IOHEXOL 240 MG/ML SOLN
25.0000 mL | Freq: Once | INTRAMUSCULAR | Status: AC | PRN
  Administered 2015-07-04: 25 mL via ORAL

## 2015-07-04 MED ORDER — SODIUM CHLORIDE 0.9 % IV SOLN
INTRAVENOUS | Status: DC
  Administered 2015-07-04 – 2015-07-10 (×13): via INTRAVENOUS

## 2015-07-04 MED ORDER — SODIUM CHLORIDE 0.9 % IV BOLUS (SEPSIS)
500.0000 mL | Freq: Once | INTRAVENOUS | Status: AC
  Administered 2015-07-04: 500 mL via INTRAVENOUS

## 2015-07-04 MED ORDER — HYDRALAZINE HCL 25 MG PO TABS
25.0000 mg | ORAL_TABLET | Freq: Three times a day (TID) | ORAL | Status: DC
  Administered 2015-07-04 – 2015-07-10 (×17): 25 mg via ORAL
  Filled 2015-07-04 (×18): qty 1

## 2015-07-04 MED ORDER — SOTALOL HCL 80 MG PO TABS
80.0000 mg | ORAL_TABLET | Freq: Two times a day (BID) | ORAL | Status: DC
  Administered 2015-07-04 – 2015-07-10 (×12): 80 mg via ORAL
  Filled 2015-07-04 (×12): qty 1

## 2015-07-04 MED ORDER — CIPROFLOXACIN IN D5W 400 MG/200ML IV SOLN
400.0000 mg | Freq: Once | INTRAVENOUS | Status: AC
  Administered 2015-07-04: 400 mg via INTRAVENOUS
  Filled 2015-07-04: qty 200

## 2015-07-04 MED ORDER — LEVOTHYROXINE SODIUM 50 MCG PO TABS
50.0000 ug | ORAL_TABLET | Freq: Every day | ORAL | Status: DC
  Administered 2015-07-05 – 2015-07-10 (×5): 50 ug via ORAL
  Filled 2015-07-04 (×5): qty 1

## 2015-07-04 NOTE — Progress Notes (Signed)
Called by nursing staff due to continued complaints of pain and patient/family questions about the plan.  Patient reports that she has been unable to fully explain what is going on to her son. She states that she has continued to have pain and that what she is currently getting through the PCA isn't lasting her.  Exam: AFVSS Gen NAD ABD: soft, TTP in LLQ    [name]: 73 y/o female with diverticulitis Discussed in detail with the patient and her son the plan to treat with IV ABX with the goal of decreasing the infection/inflammation to allow for follow up colonoscopy and elective surgical intervention. That should she fail medical treatment that urgent surgery would then be needed that carries with it a much higher risk of needing an ostomy. They appeared to voice understanding of the plan.   Will give 1 time dose of IV dilaudid in addition to PCA Will decrease interval on PCA  Will continue to follow closely  Clayburn Pert, MD [facility]

## 2015-07-04 NOTE — ED Notes (Addendum)
Pt reports that she saw Dr. Percell Boston PA today and was told that she would be admitted because she has been unable to eat and has been in constant pain for over a week and because she had bright red blood in her stool (small amount in her panties). She said they want to get her diverticulitis and pain and lack of appetite under control. Pt alert & oriented with warm, dry skin.

## 2015-07-04 NOTE — ED Provider Notes (Signed)
[facility] Emergency Department Provider Note  ____________________________________________  Time seen: Approximately 2:25 PM  I have reviewed the triage vital signs and the nursing notes.   HISTORY  Chief Complaint Abdominal Pain    HPI Julie Mcmillan is a 74 y.o. female with history of fibrillation, hypertension, GERD, interstitial lung disease who presents for evaluation of 2-3 weeks gradual onset constant/worsening aching right lower quadrant pain.  She was seen here on 06/28/2015 for similar complaints and was diagnosed with diverticulitis by CT scan. She has been treated as an outpatient with Cipro-and Flagyl as well as Percocet however reports continued pain. No fevers. She has had diarrhea. She denies noting any gross blood in her stool but was told by the PA at her GI doctors office today that she had hidden blood in her stool. No chest pain, no difficulty breathing. Currently her pain is moderate to severe. No modifying factors.   Past Medical History  Diagnosis Date  . Irregular heart beat   . Hypertension   . IBS (irritable bowel syndrome)   . Shortness of breath dyspnea   . GERD (gastroesophageal reflux disease)   . Interstitial lung disease ([place])   . Seasonal affective disorder [facility])     Patient Active Problem List   Diagnosis Date Noted  . Atrial fibrillation with rapid ventricular response ([place]) 03/25/2015    Past Surgical History  Procedure Laterality Date  . Nissen fundoplication    . Abdominal hysterectomy      partial  . Appendectomy    . Tonsillectomy      and adenoidectomy  . Colonoscopy    . Cataract extraction w/phaco Right 03/16/2015    Procedure: CATARACT EXTRACTION PHACO AND INTRAOCULAR LENS PLACEMENT (IOC);  Surgeon: Leandrew Koyanagi, MD;  Location: [facility];  Service: Ophthalmology;  Laterality: Right;    Current Outpatient Rx  Name  Route  Sig  Dispense  Refill  . acetaminophen (TYLENOL) 325 MG  tablet   Oral   Take 650 mg by mouth every 4 (four) hours as needed for mild pain or headache.          Marland Kitchen apixaban (ELIQUIS) 5 MG TABS tablet   Oral   Take 5 mg by mouth 2 (two) times daily.         . Calcium Carbonate-Vitamin D (CALCIUM 600+D) 600-200 MG-UNIT TABS   Oral   Take 1 tablet by mouth daily.         . cholestyramine (QUESTRAN) 4 GM/DOSE powder   Oral   Take 4 g by mouth daily.          . ciprofloxacin (CIPRO) 500 MG tablet   Oral   Take 1 tablet (500 mg total) by mouth 2 (two) times daily.   14 tablet   0   . glimepiride (AMARYL) 2 MG tablet   Oral   Take 1 tablet (2 mg total) by mouth daily with breakfast.   30 tablet   3     Stop Metformin for diarrhea.   . hydrALAZINE (APRESOLINE) 25 MG tablet   Oral   Take 25 mg by mouth 3 (three) times daily.         Marland Kitchen levothyroxine (SYNTHROID, LEVOTHROID) 50 MCG tablet   Oral   Take 50 mcg by mouth daily before breakfast.         . losartan (COZAAR) 100 MG tablet   Oral   Take 100 mg by mouth daily.          Marland Kitchen  metoprolol (LOPRESSOR) 100 MG tablet   Oral   Take 100 mg by mouth 2 (two) times daily.         . metroNIDAZOLE (FLAGYL) 500 MG tablet   Oral   Take 1 tablet (500 mg total) by mouth 3 (three) times daily.   30 tablet   0   . Multiple Vitamin (MULTIVITAMIN WITH MINERALS) TABS tablet   Oral   Take 1 tablet by mouth daily.         . ondansetron (ZOFRAN ODT) 8 MG disintegrating tablet   Oral   Take 1 tablet (8 mg total) by mouth every 8 (eight) hours as needed for nausea or vomiting.   20 tablet   0   . oxyCODONE-acetaminophen (ROXICET) 5-325 MG tablet   Oral   Take 1 tablet by mouth every 6 (six) hours as needed for severe pain.   12 tablet   0   . pantoprazole (PROTONIX) 40 MG tablet   Oral   Take 1 tablet (40 mg total) by mouth daily.   90 tablet   1   . sotalol (BETAPACE) 80 MG tablet   Oral   Take 1 tablet (80 mg total) by mouth every 12 (twelve) hours.   60  tablet   0   . venlafaxine XR (EFFEXOR-XR) 150 MG 24 hr capsule   Oral   Take 1 capsule (150 mg total) by mouth daily with lunch.   30 capsule   0     Allergies Amlodipine and Nexium  Family History  Problem Relation Age of Onset  . Breast cancer Mother   . Diabetes Mellitus II Mother   . Hypothyroidism Mother   . Atrial fibrillation Mother   . Heart attack Father     Social History Social History  Substance Use Topics  . Smoking status: Never Smoker   . Smokeless tobacco: None  . Alcohol Use: No    Review of Systems Constitutional: No fever/chills Eyes: No visual changes. ENT: No sore throat. Cardiovascular: Denies chest pain. Respiratory: Denies shortness of breath. Gastrointestinal: +abdominal pain.  No nausea, no vomiting.  + diarrhea.  No constipation. Genitourinary: Negative for dysuria. Musculoskeletal: Negative for back pain. Skin: Negative for rash. Neurological: Negative for headaches, focal weakness or numbness.  10-point ROS otherwise negative.  ____________________________________________   PHYSICAL EXAM:  VITAL SIGNS: ED Triage Vitals  Enc Vitals Group     BP 07/04/15 1224 146/96 mmHg     Pulse Rate 07/04/15 1224 66     Resp 07/04/15 1224 18     Temp 07/04/15 1224 98.3 F (36.8 C)     Temp Source 07/04/15 1224 Oral     SpO2 07/04/15 1224 97 %     Weight 07/04/15 1224 160 lb (72.576 kg)     Height 07/04/15 1224 _0  (1.626 m)     Head Cir --      Peak Flow --      Pain Score 07/04/15 1231 2     Pain Loc --      Pain Edu? --      Excl. in [place]? --     Constitutional: Alert and oriented. Well appearing and in no acute distress. Eyes: Conjunctivae are normal. PERRL. EOMI. Head: Atraumatic. Nose: No congestion/rhinnorhea. Mouth/Throat: Mucous membranes are moist.  Oropharynx non-erythematous. Neck: No stridor.  Cardiovascular: Normal rate, regular rhythm. Grossly normal heart sounds.  Good peripheral circulation. Respiratory: Normal  respiratory effort.  No retractions. Lungs CTAB. Gastrointestinal: Soft  with moderate diffuse tenderness to palpation, no rebound or guarding. No CVA tenderness. Genitourinary: deferred Rectal: clear mucous in rectal vault is guaiac negative Musculoskeletal: No lower extremity tenderness nor edema.  No joint effusions. Neurologic:  Normal speech and language. No gross focal neurologic deficits are appreciated.  Skin:  Skin is warm, dry and intact. No rash noted. Psychiatric: Mood and affect are normal. Speech and behavior are normal.  ____________________________________________   LABS (all labs ordered are listed, but only abnormal results are displayed)  Labs Reviewed  CBC WITH DIFFERENTIAL/PLATELET - Abnormal; Notable for the following:    WBC 13.7 (*)    Neutro Abs 10.4 (*)    Monocytes Absolute 1.8 (*)    All other components within normal limits  COMPREHENSIVE METABOLIC PANEL - Abnormal; Notable for the following:    Glucose, Bld 114 (*)    Calcium 8.4 (*)    ALT 13 (*)    All other components within normal limits  URINALYSIS COMPLETEWITH MICROSCOPIC ([facility] ONLY) - Abnormal; Notable for the following:    Color, Urine AMBER (*)    APPearance CLEAR (*)    Hgb urine dipstick 1+ (*)    Leukocytes, UA TRACE (*)    Squamous Epithelial / LPF 0-5 (*)    All other components within normal limits  CULTURE, BLOOD (ROUTINE X 2)  CULTURE, BLOOD (ROUTINE X 2)   ____________________________________________  EKG  none ____________________________________________  RADIOLOGY  CT abdomen and pelvis  IMPRESSION: 1. Persistent acute diverticulitis involving the proximal and mid sigmoid colon, now associated with a very small intramural abscess involving the distal sigmoid colon. There is no abscess elsewhere in the abdomen or pelvis. 2. No new abnormalities elsewhere involving the abdomen or pelvis since the examination 06/28/2015. 3. Mild diffuse hepatic steatosis without focal  hepatic parenchymal abnormality. 4. Post cholecystectomy intrahepatic and extrahepatic biliary ductal dilation, unchanged. 5. Moderate-sized hiatal hernia as noted previously. 6. Scarring involving both kidneys as noted previously. 7. Very small splenic cysts as noted previously. 8. 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 ____________________________________________   PROCEDURES  Procedure(s) performed: None  Critical Care performed: No  ____________________________________________   INITIAL IMPRESSION / ASSESSMENT AND PLAN / ED COURSE  Pertinent labs & imaging results that were available during my care of the patient were reviewed by me and considered in my medical decision making (see chart for details).  Julie Mcmillan is a 74 y.o. female with history of fibrillation, hypertension, GERD, interstitial lung disease who presents for evaluation of 2-3 weeks gradual onset constant/worsening aching right lower quadrant pain in the setting of treated diverticulitis on exam, she is generally well-appearing and in no acute distress. Vital signs stable, she is afebrile. She does have diffuse tenderness to palpation throughout the abdomen. Rectal exam with clear mucous in the rectal vault is guaiac negative. Labs notable for worsening leukocytosis despite treatment. Given her continued complaints of pain, diarrhea, worsening leukocytosis, we will repeat CT abdomen and pelvis to evaluate for complications of diverticulitis. We'll treat her pain. Reassess for disposition.  ----------------------------------------- 4:49 PM on 07/04/2015 ----------------------------------------- CT abdomen and pelvis notable for continue diverticulitis with concern for new intramural abscess involving the distal sigmoid. IV Cipro, IV Flagyl ordered. Case discussed with Dr. Rexene Edison of general surgery who will evaluate the patient and  admit.  ____________________________________________   FINAL CLINICAL IMPRESSION(S) / ED DIAGNOSES  Final diagnoses:  Abdominal pain, unspecified abdominal location  Diverticulitis of large intestine with abscess without bleeding  Joanne Gavel, MD 07/04/15 2234

## 2015-07-04 NOTE — ED Notes (Signed)
Reports RLQ pain, states last week dx with diverticulitis, not better.  Reports diarrhea.  Today had a more formed stool with bright red blood.  Skin w/d with good color.

## 2015-07-04 NOTE — H&P (Signed)
CC: Persistent suprapubic pain  HPI: [name] is a pleasant 74 yo F with a history of atrial fibrillation on eliquis who presents with 1.5 week of persistent suprapubic pain and chills.  Was seen approx 1 week ago and CT scan showed diverticulitis.  Was sent home with PO cipro and flagyl.  Presents with persistent pain.  + chills.  Has had multiple smaller episodes over past few years with spontaneous resolution.  + diarrhea.  No fevers, chest pain, shortness of breath, cough, nausea/vomiting, dysuria/hematuria.  Active Ambulatory Problems    Diagnosis Date Noted  . Atrial fibrillation with rapid ventricular response ([place]) 03/25/2015   Resolved Ambulatory Problems    Diagnosis Date Noted  . No Resolved Ambulatory Problems   Past Medical History  Diagnosis Date  . Irregular heart beat   . Hypertension   . IBS (irritable bowel syndrome)   . Shortness of breath dyspnea   . GERD (gastroesophageal reflux disease)   . Interstitial lung disease ([place])   . Seasonal affective disorder [facility])    Past Surgical History  Procedure Laterality Date  . Nissen fundoplication    . Abdominal hysterectomy      partial  . Appendectomy    . Tonsillectomy      and adenoidectomy  . Colonoscopy    . Cataract extraction w/phaco Right 03/16/2015    Procedure: CATARACT EXTRACTION PHACO AND INTRAOCULAR LENS PLACEMENT (IOC);  Surgeon: Leandrew Koyanagi, MD;  Location: [place];  Service: Ophthalmology;  Laterality: Right;   .   Medication List    ASK your doctor about these medications        acetaminophen 325 MG tablet  Commonly known as:  TYLENOL  Take 650 mg by mouth every 4 (four) hours as needed for mild pain or headache.     CALCIUM 600+D 600-200 MG-UNIT Tabs  Generic drug:  Calcium Carbonate-Vitamin D  Take 1 tablet by mouth daily.     cholestyramine 4 GM/DOSE powder  Commonly known as:  QUESTRAN  Take 4 g by mouth daily.     ciprofloxacin 500 MG tablet  Commonly known as:   CIPRO  Take 1 tablet (500 mg total) by mouth 2 (two) times daily.     ELIQUIS 5 MG Tabs tablet  Generic drug:  apixaban  Take 5 mg by mouth 2 (two) times daily.     glimepiride 2 MG tablet  Commonly known as:  AMARYL  Take 1 tablet (2 mg total) by mouth daily with breakfast.     hydrALAZINE 25 MG tablet  Commonly known as:  APRESOLINE  Take 25 mg by mouth 3 (three) times daily.     levothyroxine 50 MCG tablet  Commonly known as:  SYNTHROID, LEVOTHROID  Take 50 mcg by mouth daily before breakfast.     losartan 100 MG tablet  Commonly known as:  COZAAR  Take 100 mg by mouth daily.     metoprolol 100 MG tablet  Commonly known as:  LOPRESSOR  Take 100 mg by mouth 2 (two) times daily.     metroNIDAZOLE 500 MG tablet  Commonly known as:  FLAGYL  Take 1 tablet (500 mg total) by mouth 3 (three) times daily.     multivitamin with minerals Tabs tablet  Take 1 tablet by mouth daily.     ondansetron 8 MG disintegrating tablet  Commonly known as:  ZOFRAN ODT  Take 1 tablet (8 mg total) by mouth every 8 (eight) hours as needed for nausea  or vomiting.     oxyCODONE-acetaminophen 5-325 MG tablet  Commonly known as:  ROXICET  Take 1 tablet by mouth every 6 (six) hours as needed for severe pain.     pantoprazole 40 MG tablet  Commonly known as:  PROTONIX  Take 1 tablet (40 mg total) by mouth daily.     sotalol 80 MG tablet  Commonly known as:  BETAPACE  Take 1 tablet (80 mg total) by mouth every 12 (twelve) hours.     venlafaxine XR 150 MG 24 hr capsule  Commonly known as:  EFFEXOR-XR  Take 1 capsule (150 mg total) by mouth daily with lunch.       Allergies  Allergen Reactions  . Amlodipine Swelling  . Nexium [Esomeprazole Magnesium] Diarrhea   Social History   Social History  . Marital Status: Divorced    Spouse Name: N/A  . Number of Children: N/A  . Years of Education: N/A   Occupational History  . Not on file.   Social History Main Topics  . Smoking status:  Never Smoker   . Smokeless tobacco: Not on file  . Alcohol Use: No  . Drug Use: Not on file  . Sexual Activity: Not on file   Other Topics Concern  . Not on file   Social History Narrative   Family History  Problem Relation Age of Onset  . Breast cancer Mother   . Diabetes Mellitus II Mother   . Hypothyroidism Mother   . Atrial fibrillation Mother   . Heart attack Father    ROS: Full ROS obtained, pertinent positives and negatives as above  Blood pressure 166/99, pulse 77, temperature 98.3 F (36.8 C), temperature source Oral, resp. rate 18, height _0  (1.626 m), weight 160 lb (72.576 kg), SpO2 98 %. GEN: NAD/A&Ox3 FACE: no obvious facial trauma, normal external nose, normal external ears EYES: no scleral icterus, no conjunctivitis HEAD: normocephalic atraumatic CV: RRR, no MRG RESP: moving air well, lungs clear ABD: soft, + suprapubic tenderness R>L, nondistended EXT: moving all ext well, strength 5/5 NEURO: cnII-XII grossly intact, sensation intact all 4 ext  Labs: personally reviewed, significant for WBC 13.7 (12.8 approx 2 days ago, 15.0 1 week ago)  CT: Personally reviewed, significant for sigmoid diverticulitis  Julie Mcmillan 74 yo F who presents with persistent suprapubic pain, elevated WBC and diverticulitis on CT scan. Has failed outpatient therapy.  Will admit for NPO and IV abx.  Have discussed possibility of surgery if no improvement.  No acute surgical intervention at this time.

## 2015-07-05 DIAGNOSIS — K5732 Diverticulitis of large intestine without perforation or abscess without bleeding: Secondary | ICD-10-CM

## 2015-07-05 LAB — PHOSPHORUS: PHOSPHORUS: 3.8 mg/dL (ref 2.5–4.6)

## 2015-07-05 LAB — CBC
HCT: 35.4 % (ref 35.0–47.0)
HEMOGLOBIN: 11.5 g/dL — AB (ref 12.0–16.0)
MCH: 26.9 pg (ref 26.0–34.0)
MCHC: 32.5 g/dL (ref 32.0–36.0)
MCV: 82.7 fL (ref 80.0–100.0)
PLATELETS: 352 10*3/uL (ref 150–440)
RBC: 4.28 MIL/uL (ref 3.80–5.20)
RDW: 14.4 % (ref 11.5–14.5)
WBC: 10 10*3/uL (ref 3.6–11.0)

## 2015-07-05 LAB — COMPREHENSIVE METABOLIC PANEL
ALT: 12 U/L — ABNORMAL LOW (ref 14–54)
AST: 18 U/L (ref 15–41)
Albumin: 3.1 g/dL — ABNORMAL LOW (ref 3.5–5.0)
Alkaline Phosphatase: 73 U/L (ref 38–126)
Anion gap: 4 — ABNORMAL LOW (ref 5–15)
BILIRUBIN TOTAL: 1 mg/dL (ref 0.3–1.2)
BUN: 7 mg/dL (ref 6–20)
CHLORIDE: 107 mmol/L (ref 101–111)
CO2: 29 mmol/L (ref 22–32)
CREATININE: 0.79 mg/dL (ref 0.44–1.00)
Calcium: 8.1 mg/dL — ABNORMAL LOW (ref 8.9–10.3)
Glucose, Bld: 90 mg/dL (ref 65–99)
POTASSIUM: 3.8 mmol/L (ref 3.5–5.1)
Sodium: 140 mmol/L (ref 135–145)
TOTAL PROTEIN: 5.8 g/dL — AB (ref 6.5–8.1)

## 2015-07-05 LAB — GLUCOSE, CAPILLARY
GLUCOSE-CAPILLARY: 71 mg/dL (ref 65–99)
Glucose-Capillary: 87 mg/dL (ref 65–99)
Glucose-Capillary: 85 mg/dL (ref 65–99)
Glucose-Capillary: 72 mg/dL (ref 65–99)
Glucose-Capillary: 73 mg/dL (ref 65–99)

## 2015-07-05 LAB — MAGNESIUM: MAGNESIUM: 1.8 mg/dL (ref 1.7–2.4)

## 2015-07-05 NOTE — Progress Notes (Signed)
Okay per Dr. Rexene Edison to give all blood pressure pills

## 2015-07-05 NOTE — Progress Notes (Signed)
Surgery Progress Note  S: Says that her pain is better.  + BM.  No nausea. O:Blood pressure 108/52, pulse 73, temperature 98 F (36.7 C), temperature source Oral, resp. rate 16, height 5' 4" (1.626 m), weight 160 lb (72.576 kg), SpO2 98 %. GEN: NAD/A&Ox3 ABD: soft, mild tender suprapubic, nondistended  WBC 10 (down from 13.7)  [name] 74 yo admit with diverticulitis, improving - NPO - IV abx

## 2015-07-06 LAB — GLUCOSE, CAPILLARY
Glucose-Capillary: 71 mg/dL (ref 65–99)
Glucose-Capillary: 86 mg/dL (ref 65–99)

## 2015-07-06 LAB — TROPONIN I: Troponin I: 0.05 ng/mL — ABNORMAL HIGH (ref <0.031)

## 2015-07-06 NOTE — Progress Notes (Signed)
Surgery Progress Note  S: Feels better. C/o bloating O:Blood pressure 145/61, pulse 81, temperature 98.8 F (37.1 C), temperature source Oral, resp. rate 18, height _0  (1.626 m), weight 160 lb (72.576 kg), SpO2 99 %. GEN: NAD/A&Ox3 ABD: soft, min distention, min pain  [name] 74 yo admit for diverticulitis, slowly improving - IV abx - NPO - ambulate

## 2015-07-06 NOTE — Progress Notes (Signed)
Left a message with Dr. Sharen Hones nurse about abnormal EKG. Nurse reported she would inform Dr. Rexene Edison.

## 2015-07-07 LAB — TROPONIN I
Troponin I: 0.05 ng/mL — ABNORMAL HIGH (ref <0.031)
Troponin I: <0.03 ng/mL (ref <0.031)

## 2015-07-07 LAB — GLUCOSE, CAPILLARY
GLUCOSE-CAPILLARY: 91 mg/dL (ref 65–99)
GLUCOSE-CAPILLARY: 92 mg/dL (ref 65–99)
Glucose-Capillary: 90 mg/dL (ref 65–99)
Glucose-Capillary: 93 mg/dL (ref 65–99)
Glucose-Capillary: 97 mg/dL (ref 65–99)

## 2015-07-07 NOTE — Progress Notes (Signed)
Spoke to Dr Adonis Huguenin at 2140 concerning troponin 0.05. No new orders, monitor , next level drawn in 6 hours.

## 2015-07-07 NOTE — Progress Notes (Signed)
Surgery Progress Note  S: + flatus and BM. Feels better.  Troponins negative overnight O: Blood pressure 144/65, pulse 73, temperature 98 F (36.7 C), temperature source Oral, resp. rate 16, height _0  (1.626 m), weight 160 lb (72.576 kg), SpO2 97 %. GEN: NAD/A&Ox3 ABD: soft, min tender, nondistended  [name] 74 yo admit with diverticulitis, doing well - clear liquids - PT - OOB to chair

## 2015-07-07 NOTE — Evaluation (Signed)
Physical Therapy Evaluation Patient Details Name: Julie Mcmillan MRN: [id number] DOB: Dec 04, 1940 Today's Date: 07/07/2015   History of Present Illness  presented to ER with persistent abdominal pain; admitted for management of diverticulitis (failed outpatient treatment).  Currently under conservative management, but surgery possible if symptoms not improved.  Clinical Impression  Upon evaluation, patient alert and oriented to all information, follows all commands and demonstrates good insight/safety awareness.  Eager to participate with PT and regain strength/mobility.  Bilat UE/LE strength and ROM grossly WFL for basic transfers and mobility; minimal pain noted during session (FACES 2/10 in abdomen).  Currently able to perform bed mobility with mod indep; sit/stand, basic transfers and gait (75') without assist device, cga/min assist.  Offered RW, but patient declined; did tend to reach/hold IV pole during gait efforts.  Will plan to trial RW next session to maximize balance and cardiopulmonary endurance (BORG 8/10 after gait trial this date). Would benefit from skilled PT to address above deficits and promote optimal return to PLOF; Recommend transition to [place] upon discharge from acute hospitalization.     Follow Up Recommendations Home health PT    Equipment Recommendations  Rolling walker with 5" wheels    Recommendations for Other Services       Precautions / Restrictions Precautions Precautions: Fall Restrictions Weight Bearing Restrictions: No      Mobility  Bed Mobility Overal bed mobility: Independent                Transfers Overall transfer level: Needs assistance   Transfers: Sit to/from Stand Sit to Stand: Min guard         General transfer comment: limited use of bilat UEs required  Ambulation/Gait Ambulation/Gait assistance: Min guard Ambulation Distance (Feet): 75 Feet Assistive device: None       General Gait Details: reciprocal stepping  with fair step height/length; guarded posturing with limited trunk rotation/arm swing.  Patient declined use of RW at this time, but did tend to reach/hold IV pole for external stabilization throughout gait trial; will trial RW next session.  BORG 8/10 with exertion.  Stairs            Wheelchair Mobility    Modified Rankin (Stroke Patients Only)       Balance Overall balance assessment: Needs assistance Sitting-balance support: No upper extremity supported;Feet supported Sitting balance-Leahy Scale: Good     Standing balance support: No upper extremity supported Standing balance-Leahy Scale: Fair                               Pertinent Vitals/Pain Pain Assessment: Faces Faces Pain Scale: Hurts a little bit Pain Location: abdomen Pain Descriptors / Indicators: Aching Pain Intervention(s): Limited activity within patient's tolerance;Monitored during session;Repositioned (offered PCA; patient declined at this time)    Home Living Family/patient expects to be discharged to:: Private residence Living Arrangements: Alone Available Help at Discharge: Family Type of Home: Apartment (senior living complex) Home Access: Level entry     Home Layout: One level   Additional Comments: grab bars, pull cords throughout apartment    Prior Function Level of Independence: Independent         Comments: Indep with household and community mobility; denies fall history in previous six months     Hand Dominance        Extremity/Trunk Assessment   Upper Extremity Assessment: Overall WFL for tasks assessed  Lower Extremity Assessment: Overall WFL for tasks assessed         Communication   Communication: No difficulties  Cognition Arousal/Alertness: Awake/alert Behavior During Therapy: WFL for tasks assessed/performed Overall Cognitive Status: Within Functional Limits for tasks assessed                      General Comments       Exercises        Assessment/Plan    PT Assessment Patient needs continued PT services  PT Diagnosis Difficulty walking;Generalized weakness   PT Problem List Decreased activity tolerance;Decreased balance;Decreased mobility  PT Treatment Interventions DME instruction;Gait training;Stair training;Functional mobility training;Therapeutic activities;Therapeutic exercise;Balance training;Patient/family education   PT Goals (Current goals can be found in the Care Plan section) Acute Rehab PT Goals Patient Stated Goal: "to get moving around a little bit" PT Goal Formulation: With patient Time For Goal Achievement: 07/21/15 Potential to Achieve Goals: Good    Frequency Min 2X/week   Barriers to discharge Decreased caregiver support      Co-evaluation               End of Session Equipment Utilized During Treatment: Gait belt Activity Tolerance: Patient tolerated treatment well Patient left: in chair;with call bell/phone within reach;with chair alarm set Nurse Communication: Mobility status         Time: 7564-3329 PT Time Calculation (min) (ACUTE ONLY): 17 min   Charges:   PT Evaluation $Initial PT Evaluation Tier I: 1 Procedure     PT G Codes:        Chassidy Layson H. Owens Shark, PT, DPT, NCS 07/07/2015, 9:51 AM [phone]

## 2015-07-08 ENCOUNTER — Inpatient Hospital Stay: Payer: Medicare Other

## 2015-07-08 ENCOUNTER — Encounter: Payer: Self-pay | Admitting: Radiology

## 2015-07-08 ENCOUNTER — Ambulatory Visit: Admit: 2015-07-08 | Payer: PPO | Admitting: Unknown Physician Specialty

## 2015-07-08 LAB — GLUCOSE, CAPILLARY
GLUCOSE-CAPILLARY: 101 mg/dL — AB (ref 65–99)
Glucose-Capillary: 87 mg/dL (ref 65–99)
Glucose-Capillary: 106 mg/dL — ABNORMAL HIGH (ref 65–99)

## 2015-07-08 LAB — CBC
HEMATOCRIT: 32.8 % — AB (ref 35.0–47.0)
HEMOGLOBIN: 10.7 g/dL — AB (ref 12.0–16.0)
MCH: 26.9 pg (ref 26.0–34.0)
MCHC: 32.5 g/dL (ref 32.0–36.0)
MCV: 82.6 fL (ref 80.0–100.0)
Platelets: 373 10*3/uL (ref 150–440)
RBC: 3.97 MIL/uL (ref 3.80–5.20)
RDW: 14.7 % — ABNORMAL HIGH (ref 11.5–14.5)
WBC: 10.5 10*3/uL (ref 3.6–11.0)

## 2015-07-08 LAB — TROPONIN I: Troponin I: <0.03 ng/mL (ref <0.031)

## 2015-07-08 SURGERY — COLONOSCOPY WITH PROPOFOL
Anesthesia: General

## 2015-07-08 MED ORDER — IOHEXOL 350 MG/ML SOLN
100.0000 mL | Freq: Once | INTRAVENOUS | Status: AC | PRN
  Administered 2015-07-08: 100 mL via INTRAVENOUS

## 2015-07-08 NOTE — Care Management Important Message (Signed)
Important Message  Patient Details  Name: Julie Mcmillan MRN: [id number] Date of Birth: 1940-11-20   Medicare Important Message Given:  Yes-third notification given    Alvie Heidelberg, RN 07/08/2015, 10:18 AM

## 2015-07-08 NOTE — Care Management (Signed)
Spoke with patient who is alert and oriented. Patient is form home and stated that she does not use any DME at home or O2. Patient states that she drives self to appointments.  Has an adult son the looks in on her.  Patient has history of diverticulitis. Denies issues with medications. Physical therapy recommends home health, Patient stated that she feels like outpatient PT would be better for her since she is able to drive.  Patient offered choice of home health providers and chose to go with [facility] if needed. Referral placed with [facility]).

## 2015-07-08 NOTE — Progress Notes (Signed)
Pt complained of chest pain with heaviness. Notified Dr. Rexene Edison. Dr acknowledged and ordered stat EKG and troponins.

## 2015-07-08 NOTE — Progress Notes (Signed)
Surgery Progress Note  S: Feels better.  + flatus, min liquids yesterday out of fear O:Blood pressure 149/64, pulse 74, temperature 98 F (36.7 C), temperature source Oral, resp. rate 16, height _0  (1.626 m), weight 160 lb (72.576 kg), SpO2 100 %. GEN: NAD/A&Ox3 ABD: soft, min tender, nondistended  [name] 74 yo admit for diverticulitis, doing well - IV abx - liquids for now - possible regular diet later

## 2015-07-08 NOTE — Progress Notes (Signed)
Initial Nutrition Assessment     INTERVENTION:  Meals and snacks: Cater to pt preferences Nutrition diet education: Pt concerned about muscle mass and weakness.  Discussed options and ways to increase protein once able to take solid foods.  Pt verbalized understanding and expect good compliance   NUTRITION DIAGNOSIS:   Inadequate oral intake related to altered GI function as evidenced by  (NPO/clear liquids).    GOAL:   Patient will meet greater than or equal to 90% of their needs    MONITOR:    (Energy intake, Digestive system, Electrolyte and renal profile)  REASON FOR ASSESSMENT:   NPO/Clear Liquid Diet    ASSESSMENT:      Pt admitted with diverticulitis, on antibiotics  Past Medical History  Diagnosis Date  . Irregular heart beat   . Hypertension   . IBS (irritable bowel syndrome)   . Shortness of breath dyspnea   . GERD (gastroesophageal reflux disease)   . Interstitial lung disease ([place])   . Seasonal affective disorder ([place])     Current Nutrition: nauseated this am and did not take clear liquids  Food/Nutrition-Related History: Pt reports 3 weeks prior to admission poor po intake, eating liquids, toast, some fruit   Medications: NS at 187m/hr  Electrolyte/Renal Profile and Glucose Profile:   Recent Labs Lab 07/02/15 1128 07/04/15 1226 07/05/15 0502  NA 137 138 140  K 3.5 3.5 3.8  CL 103 105 107  CO2 _0 BUN _1 CREATININE 0.81 0.75 0.79  CALCIUM 8.6* 8.4* 8.1*  MG  --   --  1.8  PHOS  --   --  3.8  GLUCOSE 119* 114* 90   Protein Profile:  Recent Labs Lab 07/02/15 1128 07/04/15 1226 07/05/15 0502  ALBUMIN 3.5 3.6 3.1*    Gastrointestinal Profile: Last BM: 10/6   Nutrition-Focused Physical Exam Findings: Nutrition-Focused physical exam completed. Findings are no fat depletion, normal to mild (1 area)  muscle depletion, and no edema.      Weight Change: Pt reports UBW of 152 pounds, current wt of 160 pounds      Diet Order:  Diet clear liquid Room service appropriate?: Yes; Fluid consistency:: Thin  Skin:   reviewed    Height:   Ht Readings from Last 1 Encounters:  07/04/15 5' 4" (1.626 m)    Weight:   Wt Readings from Last 1 Encounters:  07/04/15 160 lb (72.576 kg)         BMI:  Body mass index is 27.45 kg/(m^2).  Estimated Nutritional Needs:   Kcal:  BEE 1030 kcals (IF 1.0-1.2, AF 1.3) 12103-1281kcals/d  Protein:  (1.0-1.2 g/kg) 55-66 g/d  Fluid:  (30-358mkg) 1650-192546m  EDUCATION NEEDS:   No education needs identified at this time  MODU[place]llZenia ResidesD,[place])

## 2015-07-08 NOTE — Progress Notes (Signed)
Physical Therapy Treatment Patient Details Name: Julie Mcmillan MRN: [id number] DOB: [birth year] Today's Date: 07/08/2015    History of Present Illness presented to ER with persistent abdominal pain; admitted for management of diverticulitis (failed outpatient treatment).  Currently under conservative management, but surgery possible if symptoms not improved.    PT Comments    Patient remains eager/motivated to participate with skilled PT activities, but displays continued deficits in cardiopulmonary endurance and overall activity tolerance.  Maintains sats >90% on RA at rest and with activity. Did trial use of RW this date; optimal safety/stability noted with use of assist device at this time.  Recommend continued use upon discharge.   Encouraged performance of HEP, gait 3x/day with nursing and OOB as tolerated over weekend to promote continued strength/endurnace gains.  Patient voiced understanding.   Follow Up Recommendations  Home health PT     Equipment Recommendations  Rolling walker with 5" wheels    Recommendations for Other Services       Precautions / Restrictions Precautions Precautions: Fall Precaution Comments: clear liquids Restrictions Weight Bearing Restrictions: No    Mobility  Bed Mobility Overal bed mobility: Independent                Transfers Overall transfer level: Needs assistance Equipment used: Rolling walker (2 wheeled);None Transfers: Sit to/from Stand           General transfer comment: sit/stand transfers performed with and without assist device, close sup; requires UE support to complete  Ambulation/Gait Ambulation/Gait assistance: Supervision Ambulation Distance (Feet): 100 Feet Assistive device: Rolling walker (2 wheeled)       General Gait Details: reciprocal stepping pattern with slow, but steady, gait performance; improved fluidity and overall safety with use of RW.  Recommend continued use at this time.  Sats remain  >90% on RA with exertion.   Stairs            Wheelchair Mobility    Modified Rankin (Stroke Patients Only)       Balance Overall balance assessment: Needs assistance Sitting-balance support: No upper extremity supported;Feet supported Sitting balance-Leahy Scale: Good     Standing balance support: No upper extremity supported Standing balance-Leahy Scale: Fair Standing balance comment: functional reach at sink, at least 4" outside immediate BOS, with good stability; good awareness of limits of stability                    Cognition Arousal/Alertness: Awake/alert Behavior During Therapy: WFL for tasks assessed/performed Overall Cognitive Status: Within Functional Limits for tasks assessed                      Exercises Other Exercises Other Exercises: Toilet transfer, ambulatory without assist device, close sup; sit/stand from standard toilet height, close sup; standing balance for clothing management/hygiene, close sup. Other Exercises: Supine LE therex, 1x15: ankle pumps, hip abduct/adduct and SLR; encouraged performance as HEP outside of therapy.  Patient voiced understanding/agreement.    General Comments        Pertinent Vitals/Pain Faces Pain Scale: Hurts a little bit Pain Location: abdomen Pain Descriptors / Indicators: Aching Pain Intervention(s): Limited activity within patient's tolerance;Monitored during session;Patient requesting pain meds-RN notified    Home Living                      Prior Function            PT Goals (current goals can now be found in the  care plan section) Acute Rehab PT Goals Patient Stated Goal: "to get moving around a little bit" PT Goal Formulation: With patient Time For Goal Achievement: 07/21/15 Potential to Achieve Goals: Good Progress towards PT goals: Progressing toward goals    Frequency  Min 2X/week    PT Plan Current plan remains appropriate    Co-evaluation              End of Session Equipment Utilized During Treatment: Gait belt Activity Tolerance: Patient tolerated treatment well Patient left: in bed;with call bell/phone within reach;with bed alarm set     Time: 3419-6222 PT Time Calculation (min) (ACUTE ONLY): 31 min  Charges:  $Gait Training: 8-22 mins $Therapeutic Exercise: 8-22 mins                    G Codes:      Maninder Deboer H. Owens Shark, PT, DPT, NCS 07/08/2015, 12:45 PM [phone]

## 2015-07-09 LAB — CULTURE, BLOOD (ROUTINE X 2)
CULTURE: NO GROWTH
CULTURE: NO GROWTH

## 2015-07-09 LAB — C DIFFICILE QUICK SCREEN W PCR REFLEX
C DIFFICLE (CDIFF) ANTIGEN: NEGATIVE
C Diff interpretation: NEGATIVE
C Diff toxin: NEGATIVE

## 2015-07-09 LAB — CREATININE, SERUM: Creatinine, Ser: 0.5 mg/dL (ref 0.44–1.00)

## 2015-07-09 LAB — GLUCOSE, CAPILLARY
GLUCOSE-CAPILLARY: 106 mg/dL — AB (ref 65–99)
GLUCOSE-CAPILLARY: 154 mg/dL — AB (ref 65–99)
Glucose-Capillary: 97 mg/dL (ref 65–99)

## 2015-07-09 LAB — TROPONIN I: Troponin I: <0.03 ng/mL (ref <0.031)

## 2015-07-09 NOTE — Progress Notes (Signed)
Notified Dr. Rexene Edison of pt having diarrhea. Pt requested to be started on home med that she takes for diarrhea. Per Dr. Rexene Edison put in order for enteric precaution, as well as send down a sample to rule out C Diff. Dr. Rexene Edison would like to rule out C Diff prior to starting back on home med.

## 2015-07-09 NOTE — Progress Notes (Signed)
Surgery Progress Note  S: Feels better, + flatus, + BM, some nausea O: Blood pressure 178/73, pulse 71, temperature 98.1 F (36.7 C), temperature source Oral, resp. rate 20, height _0  (1.626 m), weight 160 lb (72.576 kg), SpO2 92 %. GEN: NAD/A&Ox3 ABD: soft, nontender, nondistended  [name] 74 yo admit with diverticulitis, clinically improving - regular diet - consider PO abx later, d/c PCA

## 2015-07-10 LAB — GLUCOSE, CAPILLARY
GLUCOSE-CAPILLARY: 88 mg/dL (ref 65–99)
GLUCOSE-CAPILLARY: 98 mg/dL (ref 65–99)
Glucose-Capillary: 114 mg/dL — ABNORMAL HIGH (ref 65–99)

## 2015-07-10 MED ORDER — AMOXICILLIN-POT CLAVULANATE 875-125 MG PO TABS: 1.0000 | ORAL_TABLET | Freq: Two times a day (BID) | ORAL | Status: DC

## 2015-07-10 MED ORDER — METRONIDAZOLE 500 MG PO TABS
500.0000 mg | ORAL_TABLET | Freq: Three times a day (TID) | ORAL | Status: DC
  Administered 2015-07-10: 500 mg via ORAL
  Filled 2015-07-10: qty 1

## 2015-07-10 MED ORDER — AMOXICILLIN-POT CLAVULANATE 875-125 MG PO TABS
1.0000 | ORAL_TABLET | Freq: Two times a day (BID) | ORAL | Status: DC
  Administered 2015-07-10: 1 via ORAL
  Filled 2015-07-10: qty 1

## 2015-07-10 MED ORDER — CIPROFLOXACIN HCL 500 MG PO TABS: 500.0000 mg | ORAL_TABLET | Freq: Two times a day (BID) | ORAL | Status: DC

## 2015-07-10 NOTE — Progress Notes (Signed)
Pt has stated that she is having a sharp pain on her right side. She has had one episode of diarrhea today. MD notified. Will continue to monitor patient.   Angus Seller

## 2015-07-10 NOTE — Discharge Instructions (Signed)
Call or return to ER if you develop fever greater than 101.5, nausea/vomiting, increased pain.

## 2015-07-10 NOTE — Progress Notes (Signed)
Patient discharge teaching given, including activity, diet, follow-up appoints, and medications. Patient verbalized understanding of all discharge instructions. IV access was d/c'd. Vitals are stable. Skin is intact except as charted in most recent assessments. Pt to be escorted out by NT, to be driven home by family.  Julie Mcmillan

## 2015-07-10 NOTE — Progress Notes (Signed)
PCA discontinued. 25m wasted with AJordan Hawks    LAngus Seller

## 2015-07-10 NOTE — Progress Notes (Signed)
Surgery Progress Note  S: Doing well.  No acute issues O:Blood pressure 145/63, pulse 70, temperature 97.9 F (36.6 C), temperature source Oral, resp. rate 22, height 5' 4" (1.626 m), weight 160 lb (72.576 kg), SpO2 95 %. GEN: NAD/A&Ox3 ABD: soft, nontender, nondistended  [name] 74 yo admit with diverticulitis, doing well - PO abx - possible d/c home later

## 2015-07-10 NOTE — Care Management Note (Addendum)
Case Management Note  Patient Details  Name: BYRDIE MIYAZAKI MRN: [id number] Date of Birth: [birth year]  Subjective/Objective:  Lauren RN reports that Ms Colvin is ambulating well, and Dr Rexene Edison did not order any home health PT.  If she feels that she needs either home health or outpatient PT she will request it from her PCP.                   Action/Plan:   Expected Discharge Date:                  Expected Discharge Plan:     In-House Referral:     Discharge planning Services     Post Acute Care Choice:    Choice offered to:     DME Arranged:    DME Agency:     HH Arranged:    [place] Agency:     Status of Service:     Medicare Important Message Given:  Yes-third notification given Date Medicare IM Given:    Medicare IM give by:    Date Additional Medicare IM Given:    Additional Medicare Important Message give by:     If discussed at [place] of Stay Meetings, dates discussed:    Additional Comments:  Janessa Mickle A, RN 07/10/2015, 3:20 PM

## 2015-07-14 NOTE — Discharge Summary (Signed)
Physician Discharge Summary  Patient ID: Julie Mcmillan MRN: [id number] DOB/AGE: 06-10-1941 74 y.o.  Admit date: 07/04/2015 Discharge date: 07/14/2015  Admission Diagnoses:  Discharge Diagnoses:  Active Problems:   Diverticulitis large intestine   Discharged Condition: good  Hospital Course: Ms. Romaniello presented with diverticulitis.  She was made NPO and given IV antibiotics.  Once her pain had resolved and WBC had normalized, she was began on liquids and later advanced to regular diet as tolerated.  As she tolerated regular diet, she was advanced to PO antibiotics.  She was discharged without pain and tolerating a regular diet and oral antibiotics.  Consults: None  Significant Diagnostic Studies: radiology: CT scan: acute diverticultis  Treatments: antibiotics: Zosyn  Discharge Exam: Blood pressure 132/52, pulse 70, temperature 97.8 F (36.6 C), temperature source Oral, resp. rate 20, height _0  (1.626 m), weight 160 lb (72.576 kg), SpO2 90 %. GEN: NAD/A&Ox3 ABD: soft, min tender, nondistended  Disposition: 01-Home or Self Care     Medication List    STOP taking these medications        acetaminophen 325 MG tablet  Commonly known as:  TYLENOL     ciprofloxacin 500 MG tablet  Commonly known as:  CIPRO     metroNIDAZOLE 500 MG tablet  Commonly known as:  FLAGYL      TAKE these medications        amoxicillin-clavulanate 875-125 MG tablet  Commonly known as:  AUGMENTIN  Take 1 tablet by mouth every 12 (twelve) hours.     CALCIUM 600+D 600-200 MG-UNIT Tabs  Generic drug:  Calcium Carbonate-Vitamin D  Take 1 tablet by mouth daily.     cholestyramine 4 GM/DOSE powder  Commonly known as:  QUESTRAN  Take 4 g by mouth daily.     ELIQUIS 5 MG Tabs tablet  Generic drug:  apixaban  Take 5 mg by mouth 2 (two) times daily.     glimepiride 2 MG tablet  Commonly known as:  AMARYL  Take 1 tablet (2 mg total) by mouth daily with breakfast.     hydrALAZINE 25 MG  tablet  Commonly known as:  APRESOLINE  Take 25 mg by mouth 3 (three) times daily.     levothyroxine 50 MCG tablet  Commonly known as:  SYNTHROID, LEVOTHROID  Take 50 mcg by mouth daily before breakfast.     losartan 100 MG tablet  Commonly known as:  COZAAR  Take 100 mg by mouth daily.     metoprolol 100 MG tablet  Commonly known as:  LOPRESSOR  Take 100 mg by mouth 2 (two) times daily.     multivitamin with minerals Tabs tablet  Take 1 tablet by mouth daily.     ondansetron 8 MG disintegrating tablet  Commonly known as:  ZOFRAN ODT  Take 1 tablet (8 mg total) by mouth every 8 (eight) hours as needed for nausea or vomiting.     oxyCODONE-acetaminophen 5-325 MG tablet  Commonly known as:  ROXICET  Take 1 tablet by mouth every 6 (six) hours as needed for severe pain.     pantoprazole 40 MG tablet  Commonly known as:  PROTONIX  Take 1 tablet (40 mg total) by mouth daily.     sotalol 80 MG tablet  Commonly known as:  BETAPACE  Take 1 tablet (80 mg total) by mouth every 12 (twelve) hours.     venlafaxine XR 150 MG 24 hr capsule  Commonly known as:  EFFEXOR-XR  Take 1  capsule (150 mg total) by mouth daily with lunch.           Follow-up Information    Follow up with Dia Crawford III, MD. Schedule an appointment as soon as possible for a visit in 1 week.   Specialty:  Surgery   Contact information:   [address] [place] [address] [place] [address] [phone]       Signed: Marlyce Huge 07/14/2015, 8:59 AM

## 2015-07-18 ENCOUNTER — Ambulatory Visit: Payer: PPO | Admitting: Surgery

## 2015-07-18 ENCOUNTER — Other Ambulatory Visit: Payer: Self-pay | Admitting: *Deleted

## 2015-07-18 ENCOUNTER — Encounter: Payer: Self-pay | Admitting: *Deleted

## 2015-07-18 DIAGNOSIS — M810 Age-related osteoporosis without current pathological fracture: Secondary | ICD-10-CM | POA: Insufficient documentation

## 2015-07-18 DIAGNOSIS — G47419 Narcolepsy without cataplexy: Secondary | ICD-10-CM | POA: Insufficient documentation

## 2015-07-18 DIAGNOSIS — F419 Anxiety disorder, unspecified: Secondary | ICD-10-CM | POA: Insufficient documentation

## 2015-07-18 DIAGNOSIS — R7303 Prediabetes: Secondary | ICD-10-CM | POA: Insufficient documentation

## 2015-07-18 DIAGNOSIS — E039 Hypothyroidism, unspecified: Secondary | ICD-10-CM | POA: Insufficient documentation

## 2015-07-18 DIAGNOSIS — R5383 Other fatigue: Secondary | ICD-10-CM | POA: Insufficient documentation

## 2015-07-27 ENCOUNTER — Ambulatory Visit: Payer: PPO | Admitting: Surgery

## 2015-11-10 ENCOUNTER — Encounter: Payer: Self-pay | Admitting: Emergency Medicine

## 2015-11-10 ENCOUNTER — Emergency Department
Admission: EM | Admit: 2015-11-10 | Discharge: 2015-11-10 | Disposition: A | Discharge status: Home / Self Care | Payer: Medicare Other | Attending: Emergency Medicine | Admitting: Emergency Medicine

## 2015-11-10 DIAGNOSIS — Z792 Long term (current) use of antibiotics: Secondary | ICD-10-CM | POA: Diagnosis not present

## 2015-11-10 DIAGNOSIS — E119 Type 2 diabetes mellitus without complications: Secondary | ICD-10-CM | POA: Insufficient documentation

## 2015-11-10 DIAGNOSIS — R110 Nausea: Secondary | ICD-10-CM | POA: Insufficient documentation

## 2015-11-10 DIAGNOSIS — Z7984 Long term (current) use of oral hypoglycemic drugs: Secondary | ICD-10-CM | POA: Insufficient documentation

## 2015-11-10 DIAGNOSIS — I10 Essential (primary) hypertension: Secondary | ICD-10-CM | POA: Diagnosis not present

## 2015-11-10 DIAGNOSIS — R197 Diarrhea, unspecified: Secondary | ICD-10-CM | POA: Diagnosis not present

## 2015-11-10 DIAGNOSIS — R61 Generalized hyperhidrosis: Secondary | ICD-10-CM | POA: Insufficient documentation

## 2015-11-10 DIAGNOSIS — R42 Dizziness and giddiness: Secondary | ICD-10-CM | POA: Diagnosis not present

## 2015-11-10 DIAGNOSIS — R109 Unspecified abdominal pain: Secondary | ICD-10-CM | POA: Insufficient documentation

## 2015-11-10 DIAGNOSIS — R55 Syncope and collapse: Secondary | ICD-10-CM | POA: Insufficient documentation

## 2015-11-10 DIAGNOSIS — Z79899 Other long term (current) drug therapy: Secondary | ICD-10-CM | POA: Diagnosis not present

## 2015-11-10 LAB — GASTROINTESTINAL PANEL BY PCR, STOOL (REPLACES STOOL CULTURE)
ADENOVIRUS F40/41: NOT DETECTED
Astrovirus: NOT DETECTED
Campylobacter species: NOT DETECTED
Cryptosporidium: NOT DETECTED
Cyclospora cayetanensis: NOT DETECTED
E. coli O157: NOT DETECTED
ENTAMOEBA HISTOLYTICA: NOT DETECTED
ENTEROAGGREGATIVE E COLI (EAEC): NOT DETECTED
ENTEROPATHOGENIC E COLI (EPEC): NOT DETECTED
Enterotoxigenic E coli (ETEC): NOT DETECTED
GIARDIA LAMBLIA: NOT DETECTED
NOROVIRUS GI/GII: NOT DETECTED
Plesimonas shigelloides: NOT DETECTED
Rotavirus A: NOT DETECTED
SAPOVIRUS (I, II, IV, AND V): NOT DETECTED
Salmonella species: NOT DETECTED
Shiga like toxin producing E coli (STEC): NOT DETECTED
Shigella/Enteroinvasive E coli (EIEC): NOT DETECTED
VIBRIO CHOLERAE: NOT DETECTED
Vibrio species: NOT DETECTED
Yersinia enterocolitica: NOT DETECTED

## 2015-11-10 LAB — CBC WITH DIFFERENTIAL/PLATELET
BASOS ABS: 0.1 10*3/uL (ref 0–0.1)
BASOS PCT: 1 %
EOS ABS: 0.2 10*3/uL (ref 0–0.7)
EOS PCT: 2 %
HCT: 43.8 % (ref 35.0–47.0)
Hemoglobin: 14.2 g/dL (ref 12.0–16.0)
Lymphocytes Relative: 14 %
Lymphs Abs: 1.6 10*3/uL (ref 1.0–3.6)
MCH: 25.9 pg — ABNORMAL LOW (ref 26.0–34.0)
MCHC: 32.3 g/dL (ref 32.0–36.0)
MCV: 80.1 fL (ref 80.0–100.0)
MONO ABS: 1.3 10*3/uL — AB (ref 0.2–0.9)
Monocytes Relative: 12 %
Neutro Abs: 8.1 10*3/uL — ABNORMAL HIGH (ref 1.4–6.5)
Neutrophils Relative %: 71 %
PLATELETS: 283 10*3/uL (ref 150–440)
RBC: 5.47 MIL/uL — AB (ref 3.80–5.20)
RDW: 18 % — AB (ref 11.5–14.5)
WBC: 11.4 10*3/uL — AB (ref 3.6–11.0)

## 2015-11-10 LAB — COMPREHENSIVE METABOLIC PANEL
ALT: 22 U/L (ref 14–54)
AST: 42 U/L — AB (ref 15–41)
Albumin: 4.2 g/dL (ref 3.5–5.0)
Alkaline Phosphatase: 89 U/L (ref 38–126)
Anion gap: 12 (ref 5–15)
BUN: 15 mg/dL (ref 6–20)
CHLORIDE: 103 mmol/L (ref 101–111)
CO2: 22 mmol/L (ref 22–32)
CREATININE: 0.99 mg/dL (ref 0.44–1.00)
Calcium: 9.4 mg/dL (ref 8.9–10.3)
GFR calc Af Amer: >60 mL/min (ref >60)
GFR calc non Af Amer: 54 mL/min — ABNORMAL LOW (ref >60)
Glucose, Bld: 148 mg/dL — ABNORMAL HIGH (ref 65–99)
POTASSIUM: 5.6 mmol/L — AB (ref 3.5–5.1)
SODIUM: 137 mmol/L (ref 135–145)
Total Bilirubin: 1.5 mg/dL — ABNORMAL HIGH (ref 0.3–1.2)
Total Protein: 7.2 g/dL (ref 6.5–8.1)

## 2015-11-10 LAB — C DIFFICILE QUICK SCREEN W PCR REFLEX
C DIFFICILE (CDIFF) TOXIN: NEGATIVE
C DIFFICLE (CDIFF) ANTIGEN: NEGATIVE
C Diff interpretation: NEGATIVE

## 2015-11-10 LAB — TROPONIN I

## 2015-11-10 LAB — POTASSIUM: POTASSIUM: 4.8 mmol/L (ref 3.5–5.1)

## 2015-11-10 MED ORDER — DICYCLOMINE HCL 20 MG PO TABS: 20.0000 mg | ORAL_TABLET | Freq: Three times a day (TID) | ORAL | Status: DC | PRN

## 2015-11-10 MED ORDER — ONDANSETRON HCL 4 MG/2ML IJ SOLN
4.0000 mg | Freq: Once | INTRAMUSCULAR | Status: AC
Start: 2015-11-10 — End: 2015-11-10
  Administered 2015-11-10: 4 mg via INTRAVENOUS
  Filled 2015-11-10: qty 2

## 2015-11-10 MED ORDER — SODIUM CHLORIDE 0.9 % IV BOLUS (SEPSIS)
1000.0000 mL | Freq: Once | INTRAVENOUS | Status: AC
  Administered 2015-11-10: 1000 mL via INTRAVENOUS

## 2015-11-10 NOTE — ED Notes (Signed)
Multiple bouts of diarrhea while trying to triage.

## 2015-11-10 NOTE — ED Notes (Signed)
Had blood drawn at [facility] clinic, while in the waiting room became diaphoretic, asked the nurse if she could lay down and had near syncope episode. Hair wet with sweat. States she can get sweaty when her thyroid is high. Pt had to leave triage - felt like she was going to have diarrhea

## 2015-11-10 NOTE — Discharge Instructions (Signed)
Near-Syncope Near-syncope (commonly known as near fainting) is sudden weakness, dizziness, or feeling like you might pass out. During an episode of near-syncope, you may also develop pale skin, have tunnel vision, or feel sick to your stomach (nauseous). Near-syncope may occur when getting up after sitting or while standing for a long time. It is caused by a sudden decrease in blood flow to the brain. This decrease can result from various causes or triggers, most of which are not serious. However, because near-syncope can sometimes be a sign of something serious, a medical evaluation is required. The specific cause is often not determined. HOME CARE INSTRUCTIONS  Monitor your condition for any changes. The following actions may help to alleviate any discomfort you are experiencing:  Have someone stay with you until you feel stable.  Lie down right away and prop your feet up if you start feeling like you might faint. Breathe deeply and steadily. Wait until all the symptoms have passed. Most of these episodes last only a few minutes. You may feel tired for several hours.   Drink enough fluids to keep your urine clear or pale yellow.   If you are taking blood pressure or heart medicine, get up slowly when seated or lying down. Take several minutes to sit and then stand. This can reduce dizziness.  Follow up with your health care provider as directed. SEEK IMMEDIATE MEDICAL CARE IF:   You have a severe headache.   You have unusual pain in the chest, abdomen, or back.   You are bleeding from the mouth or rectum, or you have black or tarry stool.   You have an irregular or very fast heartbeat.   You have repeated fainting or have seizure-like jerking during an episode.   You faint when sitting or lying down.   You have confusion.   You have difficulty walking.   You have severe weakness.   You have vision problems.  MAKE SURE YOU:   Understand these instructions.  Will  watch your condition.  Will get help right away if you are not doing well or get worse.   This information is not intended to replace advice given to you by your health care provider. Make sure you discuss any questions you have with your health care provider.   Document Released: 09/17/2005 Document Revised: 09/22/2013 Document Reviewed: 02/20/2013 Elsevier Interactive Patient Education [employer].  Please return immediately if condition worsens. Please contact her primary physician or the physician you were given for referral. If you have any specialist physicians involved in her treatment and plan please also contact them. Thank you for using [facility].

## 2015-11-10 NOTE — ED Notes (Signed)
States hasn't felt well for a few days, nothing specific. Has ibs

## 2015-11-10 NOTE — ED Notes (Signed)

## 2015-11-10 NOTE — ED Provider Notes (Signed)
Time Seen: Approximately 1530  I have reviewed the triage notes  Chief Complaint: Near Syncope   History of Present Illness: Julie Mcmillan is a 75 y.o. female who states that she was at the [facility] and was waiting get her blood drawn. Patient states that she felt very sweaty and lightheaded. She states that his sweatiness and lightheadedness continued for another 15-20 minutes. Patient states she never passed out, she states she felt better after sitting down. She also develop some loose watery stool and had 3 bowel movements. She states that sometimes when she is anxious she gets irritable bowel syndrome. She also is wearing a cardiac monitor to monitor for atrial fibrillation. As any chest pain. She had some transient nausea but states she feels better at this point after Zofran. She denies any persistent vomiting. She denies any melena or hematochezia. Temperature here was normal. Patient denies any arm or jaw pain. Past Medical History  Diagnosis Date  . Irregular heart beat   . Hypertension   . IBS (irritable bowel syndrome)   . Shortness of breath dyspnea   . GERD (gastroesophageal reflux disease)   . Interstitial lung disease ([place])   . Seasonal affective disorder ([place])   . Type 2 diabetes mellitus ([place]) 11/04/2014    Patient Active Problem List   Diagnosis Date Noted  . OP (osteoporosis) 07/18/2015  . 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 Adult hypothyroidism 07/18/2015  . BP (high blood pressure) 07/18/2015  . Fatigue 07/18/2015  . Anxiety 07/18/2015  . Diverticulitis large intestine 07/04/2015  . Paroxysmal atrial fibrillation ([place]) 04/22/2015  . Clinical depression 04/22/2015  . Atrial fibrillation with rapid ventricular response ([place]) 03/25/2015  . Atrial fibrillation ([place]) 03/25/2015  . Acquired hypothyroidism 01/11/2015  . Anemia, iron deficiency 01/10/2015  . Benign essential HTN 01/10/2015  . Type 2 diabetes mellitus  ([place]) 11/04/2014  . 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 Essential (primary) hypertension 11/04/2014  . Temporary cerebral vascular dysfunction 04/08/2014  . Dry mouth 01/05/2013  . Cannot sleep 01/05/2013  . Obstructive apnea 09/02/2012  . Bursitis, ischial 05/19/2012  . Acid reflux 02/01/2012  . DD (diverticular disease) 08/14/2011    Past Surgical History  Procedure Laterality Date  . Nissen fundoplication    . Abdominal hysterectomy      partial  . Appendectomy    . Tonsillectomy      and adenoidectomy  . Colonoscopy    . Cataract extraction w/phaco Right 03/16/2015    Procedure: CATARACT EXTRACTION PHACO AND INTRAOCULAR LENS PLACEMENT (IOC);  Surgeon: Leandrew Koyanagi, MD;  Location: [facility];  Service: Ophthalmology;  Laterality: Right;    Past Surgical History  Procedure Laterality Date  . Nissen fundoplication    . Abdominal hysterectomy      partial  . Appendectomy    . Tonsillectomy      and adenoidectomy  . Colonoscopy    . Cataract extraction w/phaco Right 03/16/2015    Procedure: CATARACT EXTRACTION PHACO AND INTRAOCULAR LENS PLACEMENT (IOC);  Surgeon: Leandrew Koyanagi, MD;  Location: [facility];  Service: Ophthalmology;  Laterality: Right;    Current Outpatient Rx  Name  Route  Sig  Dispense  Refill  . amoxicillin-clavulanate (AUGMENTIN) 875-125 MG tablet   Oral   Take 1 tablet by mouth every 12 (twelve) hours.   20 tablet   0   . apixaban (ELIQUIS) 5 MG TABS tablet   Oral  Take 5 mg by mouth 2 (two) times daily.         . Calcium Carbonate-Vitamin D (CALCIUM 600+D) 600-200 MG-UNIT TABS   Oral   Take 1 tablet by mouth daily.         . cholestyramine (QUESTRAN) 4 GM/DOSE powder   Oral   Take 4 g by mouth daily.          . ciprofloxacin (CIPRO) 500 MG tablet               . dicyclomine (BENTYL) 20 MG tablet   Oral   Take 1 tablet (20 mg total) by mouth 3 (three) times daily as  needed for spasms.   30 tablet   0   . glimepiride (AMARYL) 2 MG tablet   Oral   Take by mouth.         . hydrALAZINE (APRESOLINE) 25 MG tablet   Oral   Take 25 mg by mouth 3 (three) times daily.         Marland Kitchen levothyroxine (SYNTHROID, LEVOTHROID) 50 MCG tablet   Oral   Take 50 mcg by mouth daily before breakfast.         . losartan (COZAAR) 100 MG tablet   Oral   Take 100 mg by mouth daily.          . metoprolol (LOPRESSOR) 100 MG tablet   Oral   Take 100 mg by mouth 2 (two) times daily.         . metroNIDAZOLE (FLAGYL) 500 MG tablet               . Multiple Vitamin (MULTIVITAMIN WITH MINERALS) TABS tablet   Oral   Take 1 tablet by mouth daily.         . ondansetron (ZOFRAN ODT) 8 MG disintegrating tablet   Oral   Take 1 tablet (8 mg total) by mouth every 8 (eight) hours as needed for nausea or vomiting.   20 tablet   0   . oxyCODONE-acetaminophen (ROXICET) 5-325 MG tablet   Oral   Take 1 tablet by mouth every 6 (six) hours as needed for severe pain.   12 tablet   0   . pantoprazole (PROTONIX) 40 MG tablet   Oral   Take 1 tablet (40 mg total) by mouth daily.   90 tablet   1   . sotalol (BETAPACE) 80 MG tablet   Oral   Take 1 tablet (80 mg total) by mouth every 12 (twelve) hours.   60 tablet   0   . venlafaxine XR (EFFEXOR-XR) 150 MG 24 hr capsule   Oral   Take 1 capsule (150 mg total) by mouth daily with lunch.   30 capsule   0     Allergies:  Amlodipine and Nexium  Family History: Family History  Problem Relation Age of Onset  . Breast cancer Mother   . Diabetes Mellitus II Mother   . Hypothyroidism Mother   . Atrial fibrillation Mother   . Heart attack Father     Social History: Social History  Substance Use Topics  . Smoking status: Never Smoker   . Smokeless tobacco: None  . Alcohol Use: No     Review of Systems:   10 point review of systems was performed and was otherwise negative:  Constitutional: No  fever Eyes: No visual disturbances ENT: No sore throat, ear pain Cardiac: No chest pain Respiratory: No shortness of breath, wheezing, or stridor Abdomen:  Patient had some intermittent crampy abdominal pain. She states she feels better after the bouts of diarrhea. Endocrine: No weight loss, No night sweats Extremities: No peripheral edema, cyanosis Skin: No rashes, easy bruising Neurologic: No focal weakness, trouble with speech or swollowing Urologic: No dysuria, Hematuria, or urinary frequency   Physical Exam:  ED Triage Vitals  Enc Vitals Group     BP 11/10/15 1355 130/64 mmHg     Pulse Rate 11/10/15 1355 58     Resp 11/10/15 1355 18     Temp --      Temp src --      SpO2 11/10/15 1355 100 %     Weight --      Height --      Head Cir --      Peak Flow --      Pain Score --      Pain Loc --      Pain Edu? --      Excl. in [place]? --     General: Awake , Alert , and Oriented times 3; GCS 15 Head: Normal cephalic , atraumatic Eyes: Pupils equal , round, reactive to light Nose/Throat: No nasal drainage, patent upper airway without erythema or exudate.  Neck: Supple, Full range of motion, No anterior adenopathy or palpable thyroid masses Lungs: Clear to ascultation without wheezes , rhonchi, or rales Heart: Regular rate, regular rhythm without murmurs , gallops , or rubs Abdomen: Soft, non tender without rebound, guarding , or rigidity; bowel sounds positive and symmetric in all 4 quadrants. No organomegaly .        Extremities: 2 plus symmetric pulses. No edema, clubbing or cyanosis Neurologic: normal ambulation, Motor symmetric without deficits, sensory intact Skin: warm, dry, no rashes   Labs:   All laboratory work was reviewed including any pertinent negatives or positives listed below:  Labs Reviewed  CBC WITH DIFFERENTIAL/PLATELET - Abnormal; Notable for the following:    WBC 11.4 (*)    RBC 5.47 (*)    MCH 25.9 (*)    RDW 18.0 (*)    Neutro Abs 8.1 (*)     Monocytes Absolute 1.3 (*)    All other components within normal limits  COMPREHENSIVE METABOLIC PANEL - Abnormal; Notable for the following:    Potassium 5.6 (*)    Glucose, Bld 148 (*)    AST 42 (*)    Total Bilirubin 1.5 (*)    GFR calc non Af Amer 54 (*)    All other components within normal limits  C DIFFICILE QUICK SCREEN W PCR REFLEX  GASTROINTESTINAL PANEL BY PCR, STOOL (REPLACES STOOL CULTURE)  TROPONIN I  POTASSIUM   arrival patient's laboratory workup appears to be within broad limits of normal. She has slightly elevated white blood cell count. Her potassium was repeated after the first sample came back hemolyzed.  EKG:  ED ECG REPORT I, Daymon Larsen, the attending physician, personally viewed and interpreted this ECG.  Date: 11/10/2015 EKG Time: 1428 Rate: 58 Rhythm: Sinus bradycardia with nonspecific T wave abnormality and prolonged QT interval  QRS Axis: normal Intervals: normal ST/T Wave abnormalities: normal Conduction Disturbances: none Narrative Interpretation: unremarkable No acute ischemic changes are noted    ED Course:  Patient's stay here was uneventful and she remained hemodynamically stable. She was given some IV fluids and observed. Her stool was checked for C. difficile was negative with other stool testing still pending at this time. Patient's near syncope seemed to be  more of a vagal-type episode with some baseline bradycardia most likely from medication, etc. Felt was unlikely that she had an acute arrhythmia never had any loss of consciousness. She denies any chest pain and troponin test was negative here. I felt further studies were not necessary at this time.    Assessment: Near syncope Diarrhea   Final Clinical Impression:   Final diagnoses:  Near syncope     Plan: * Outpatient Patient was advised to return immediately if condition worsens. Patient was advised to follow up with their primary care physician or other specialized  physicians involved in their outpatient care Patient was prescribed Bentyl for any further crampy abdominal pain consistent with her IBS. I felt we should stay away from prescription anti-medic especially with a prolonged QT interval. She is advised especially return here if she develops a fever, bloody stool, chest pain, shortness of breath, or any other new concerns.            Daymon Larsen, MD 11/10/15 [phone]

## 2015-11-14 ENCOUNTER — Encounter: Payer: Self-pay | Admitting: Urgent Care

## 2015-11-14 DIAGNOSIS — Z7984 Long term (current) use of oral hypoglycemic drugs: Secondary | ICD-10-CM | POA: Insufficient documentation

## 2015-11-14 DIAGNOSIS — K5732 Diverticulitis of large intestine without perforation or abscess without bleeding: Secondary | ICD-10-CM | POA: Insufficient documentation

## 2015-11-14 DIAGNOSIS — E119 Type 2 diabetes mellitus without complications: Secondary | ICD-10-CM | POA: Insufficient documentation

## 2015-11-14 DIAGNOSIS — Z791 Long term (current) use of non-steroidal anti-inflammatories (NSAID): Secondary | ICD-10-CM | POA: Diagnosis not present

## 2015-11-14 DIAGNOSIS — Z79899 Other long term (current) drug therapy: Secondary | ICD-10-CM | POA: Insufficient documentation

## 2015-11-14 DIAGNOSIS — Z792 Long term (current) use of antibiotics: Secondary | ICD-10-CM | POA: Diagnosis not present

## 2015-11-14 DIAGNOSIS — R109 Unspecified abdominal pain: Secondary | ICD-10-CM | POA: Diagnosis present

## 2015-11-14 DIAGNOSIS — I10 Essential (primary) hypertension: Secondary | ICD-10-CM | POA: Insufficient documentation

## 2015-11-14 LAB — URINALYSIS COMPLETE WITH MICROSCOPIC (ARMC ONLY)
BACTERIA UA: NONE SEEN
Bilirubin Urine: NEGATIVE
Glucose, UA: NEGATIVE mg/dL
HGB URINE DIPSTICK: NEGATIVE
Ketones, ur: NEGATIVE mg/dL
LEUKOCYTES UA: NEGATIVE
Nitrite: NEGATIVE
PROTEIN: NEGATIVE mg/dL
SPECIFIC GRAVITY, URINE: 1.018 (ref 1.005–1.030)
pH: 6 (ref 5.0–8.0)

## 2015-11-14 LAB — COMPREHENSIVE METABOLIC PANEL
ALBUMIN: 4.2 g/dL (ref 3.5–5.0)
ALK PHOS: 85 U/L (ref 38–126)
ALT: 18 U/L (ref 14–54)
ANION GAP: 10 (ref 5–15)
AST: 29 U/L (ref 15–41)
BUN: 14 mg/dL (ref 6–20)
CALCIUM: 9.2 mg/dL (ref 8.9–10.3)
CHLORIDE: 101 mmol/L (ref 101–111)
CO2: 24 mmol/L (ref 22–32)
CREATININE: 0.98 mg/dL (ref 0.44–1.00)
GFR calc Af Amer: >60 mL/min (ref >60)
GFR calc non Af Amer: 55 mL/min — ABNORMAL LOW (ref >60)
GLUCOSE: 123 mg/dL — AB (ref 65–99)
Potassium: 4.2 mmol/L (ref 3.5–5.1)
SODIUM: 135 mmol/L (ref 135–145)
Total Bilirubin: 1.3 mg/dL — ABNORMAL HIGH (ref 0.3–1.2)
Total Protein: 7.2 g/dL (ref 6.5–8.1)

## 2015-11-14 LAB — CBC
HEMATOCRIT: 41.2 % (ref 35.0–47.0)
HEMOGLOBIN: 13.5 g/dL (ref 12.0–16.0)
MCH: 25.7 pg — ABNORMAL LOW (ref 26.0–34.0)
MCHC: 32.8 g/dL (ref 32.0–36.0)
MCV: 78.3 fL — AB (ref 80.0–100.0)
Platelets: 326 10*3/uL (ref 150–440)
RBC: 5.26 MIL/uL — AB (ref 3.80–5.20)
RDW: 18.1 % — ABNORMAL HIGH (ref 11.5–14.5)
WBC: 23.4 10*3/uL — AB (ref 3.6–11.0)

## 2015-11-14 LAB — LIPASE, BLOOD: LIPASE: 14 U/L (ref 11–51)

## 2015-11-14 NOTE — ED Notes (Signed)
Patient presents with LLQ abd abdominal pain. (+) lower abdominal swelling per patient's report. (+) nausea. Of note, patient reports that she was here Thursday for similar symptoms - had diarrhea at that time. Patient with PMH significant for diverticulitis.

## 2015-11-15 ENCOUNTER — Emergency Department
Admission: EM | Admit: 2015-11-15 | Discharge: 2015-11-15 | Disposition: A | Discharge status: Home / Self Care | Payer: Medicare Other | Attending: Emergency Medicine | Admitting: Emergency Medicine

## 2015-11-15 ENCOUNTER — Emergency Department: Payer: Medicare Other

## 2015-11-15 DIAGNOSIS — K5732 Diverticulitis of large intestine without perforation or abscess without bleeding: Secondary | ICD-10-CM

## 2015-11-15 HISTORY — DX: Diverticulitis of intestine, part unspecified, without perforation or abscess without bleeding: K57.92

## 2015-11-15 LAB — LACTIC ACID, PLASMA: Lactic Acid, Venous: 1.5 mmol/L (ref 0.5–2.0)

## 2015-11-15 MED ORDER — METRONIDAZOLE 500 MG PO TABS
500.0000 mg | ORAL_TABLET | Freq: Once | ORAL | Status: AC
  Administered 2015-11-15: 500 mg via ORAL
  Filled 2015-11-15: qty 1

## 2015-11-15 MED ORDER — IOHEXOL 300 MG/ML  SOLN
100.0000 mL | Freq: Once | INTRAMUSCULAR | Status: AC | PRN
  Administered 2015-11-15: 100 mL via INTRAVENOUS

## 2015-11-15 MED ORDER — ONDANSETRON 4 MG PO TBDP: 4.0000 mg | ORAL_TABLET | Freq: Three times a day (TID) | ORAL | Status: DC | PRN

## 2015-11-15 MED ORDER — CIPROFLOXACIN HCL 500 MG PO TABS: 500.0000 mg | ORAL_TABLET | Freq: Two times a day (BID) | ORAL | Status: AC

## 2015-11-15 MED ORDER — ONDANSETRON HCL 4 MG/2ML IJ SOLN
4.0000 mg | Freq: Once | INTRAMUSCULAR | Status: AC
  Administered 2015-11-15: 4 mg via INTRAVENOUS
  Filled 2015-11-15: qty 2

## 2015-11-15 MED ORDER — METRONIDAZOLE 500 MG PO TABS: 500.0000 mg | ORAL_TABLET | Freq: Two times a day (BID) | ORAL | Status: DC

## 2015-11-15 MED ORDER — CIPROFLOXACIN IN D5W 400 MG/200ML IV SOLN
400.0000 mg | Freq: Once | INTRAVENOUS | Status: AC
  Administered 2015-11-15: 400 mg via INTRAVENOUS
  Filled 2015-11-15: qty 200

## 2015-11-15 MED ORDER — SODIUM CHLORIDE 0.9 % IV BOLUS (SEPSIS)
1000.0000 mL | Freq: Once | INTRAVENOUS | Status: AC
  Administered 2015-11-15: 1000 mL via INTRAVENOUS

## 2015-11-15 MED ORDER — OXYCODONE-ACETAMINOPHEN 5-325 MG PO TABS
1.0000 | ORAL_TABLET | Freq: Four times a day (QID) | ORAL | Status: DC | PRN
Start: 2015-11-15 — End: 2015-11-29

## 2015-11-15 MED ORDER — MORPHINE SULFATE (PF) 4 MG/ML IV SOLN
4.0000 mg | Freq: Once | INTRAVENOUS | Status: AC
  Administered 2015-11-15: 4 mg via INTRAVENOUS
  Filled 2015-11-15: qty 1

## 2015-11-15 MED ORDER — IOHEXOL 240 MG/ML SOLN
25.0000 mL | Freq: Once | INTRAMUSCULAR | Status: AC | PRN
  Administered 2015-11-15: 25 mL via ORAL

## 2015-11-15 NOTE — ED Notes (Signed)
Pt reports relief of nausea and pain decreased to 5/10

## 2015-11-15 NOTE — ED Provider Notes (Signed)
[facility] Emergency Department Provider Note  ____________________________________________  Time seen: Approximately 211 AM  I have reviewed the triage vital signs and the nursing notes.   HISTORY  Chief Complaint Abdominal Pain    HPI Julie Mcmillan is a 75 y.o. female who comes into the hospital today with abdominal pain. The patient reports that she thinks she has diverticulitis. Her pain started yesterday but reports it is worse today. She has been in bed all day. The patient has difficulty walking due to her pain. She has had some nausea with no vomiting and some mild diarrhea. The patient does have a history of diverticulitis and she reports it feels very similar. The patient has pain all across her lower abdomen and rates her pain a 6 out of 10 in intensity. She is been taking Tylenol today which has helped take the edge off of the pain but has not caused it to go away. The patient reports that she did also feel sweaty tonight and was seen in the hospital recently with near syncope. The patient is here for further evaluation of her abdominal pain.   Past Medical History  Diagnosis Date  . Irregular heart beat   . Hypertension   . IBS (irritable bowel syndrome)   . Shortness of breath dyspnea   . GERD (gastroesophageal reflux disease)   . Interstitial lung disease ([place])   . Seasonal affective disorder ([place])   . Type 2 diabetes mellitus ([place]) 11/04/2014  . IBS (irritable bowel syndrome)   . Diverticulitis     Patient Active Problem List   Diagnosis Date Noted  . OP (osteoporosis) 07/18/2015  . 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 Adult hypothyroidism 07/18/2015  . BP (high blood pressure) 07/18/2015  . Fatigue 07/18/2015  . Anxiety 07/18/2015  . Diverticulitis large intestine 07/04/2015  . Paroxysmal atrial fibrillation ([place]) 04/22/2015  . Clinical depression 04/22/2015  . Atrial fibrillation with rapid  ventricular response ([place]) 03/25/2015  . Atrial fibrillation ([place]) 03/25/2015  . Acquired hypothyroidism 01/11/2015  . Anemia, iron deficiency 01/10/2015  . Benign essential HTN 01/10/2015  . Type 2 diabetes mellitus ([place]) 11/04/2014  . 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 Essential (primary) hypertension 11/04/2014  . Temporary cerebral vascular dysfunction 04/08/2014  . Dry mouth 01/05/2013  . Cannot sleep 01/05/2013  . Obstructive apnea 09/02/2012  . Bursitis, ischial 05/19/2012  . Acid reflux 02/01/2012  . DD (diverticular disease) 08/14/2011    Past Surgical History  Procedure Laterality Date  . Nissen fundoplication    . Abdominal hysterectomy      partial  . Appendectomy    . Tonsillectomy      and adenoidectomy  . Colonoscopy    . Cataract extraction w/phaco Right 03/16/2015    Procedure: CATARACT EXTRACTION PHACO AND INTRAOCULAR LENS PLACEMENT (IOC);  Surgeon: Leandrew Koyanagi, MD;  Location: [facility];  Service: Ophthalmology;  Laterality: Right;    Current Outpatient Rx  Name  Route  Sig  Dispense  Refill  . amoxicillin-clavulanate (AUGMENTIN) 875-125 MG tablet   Oral   Take 1 tablet by mouth every 12 (twelve) hours.   20 tablet   0   . apixaban (ELIQUIS) 5 MG TABS tablet   Oral   Take 5 mg by mouth 2 (two) times daily.         . Calcium Carbonate-Vitamin D (CALCIUM 600+D) 600-200 MG-UNIT TABS   Oral   Take 1 tablet  by mouth daily.         . cholestyramine (QUESTRAN) 4 GM/DOSE powder   Oral   Take 4 g by mouth daily.          . ciprofloxacin (CIPRO) 500 MG tablet               . ciprofloxacin (CIPRO) 500 MG tablet   Oral   Take 1 tablet (500 mg total) by mouth 2 (two) times daily.   20 tablet   0   . dicyclomine (BENTYL) 20 MG tablet   Oral   Take 1 tablet (20 mg total) by mouth 3 (three) times daily as needed for spasms.   30 tablet   0   . glimepiride (AMARYL) 2 MG tablet   Oral   Take by  mouth.         . hydrALAZINE (APRESOLINE) 25 MG tablet   Oral   Take 25 mg by mouth 3 (three) times daily.         Marland Kitchen levothyroxine (SYNTHROID, LEVOTHROID) 50 MCG tablet   Oral   Take 50 mcg by mouth daily before breakfast.         . losartan (COZAAR) 100 MG tablet   Oral   Take 100 mg by mouth daily.          . metoprolol (LOPRESSOR) 100 MG tablet   Oral   Take 100 mg by mouth 2 (two) times daily.         . metroNIDAZOLE (FLAGYL) 500 MG tablet               . metroNIDAZOLE (FLAGYL) 500 MG tablet   Oral   Take 1 tablet (500 mg total) by mouth 2 (two) times daily.   20 tablet   0   . Multiple Vitamin (MULTIVITAMIN WITH MINERALS) TABS tablet   Oral   Take 1 tablet by mouth daily.         . ondansetron (ZOFRAN ODT) 4 MG disintegrating tablet   Oral   Take 1 tablet (4 mg total) by mouth every 8 (eight) hours as needed for nausea or vomiting.   20 tablet   0   . ondansetron (ZOFRAN ODT) 8 MG disintegrating tablet   Oral   Take 1 tablet (8 mg total) by mouth every 8 (eight) hours as needed for nausea or vomiting.   20 tablet   0   . oxyCODONE-acetaminophen (ROXICET) 5-325 MG tablet   Oral   Take 1 tablet by mouth every 6 (six) hours as needed for severe pain.   12 tablet   0   . oxyCODONE-acetaminophen (ROXICET) 5-325 MG tablet   Oral   Take 1 tablet by mouth every 6 (six) hours as needed.   12 tablet   0   . pantoprazole (PROTONIX) 40 MG tablet   Oral   Take 1 tablet (40 mg total) by mouth daily.   90 tablet   1   . sotalol (BETAPACE) 80 MG tablet   Oral   Take 1 tablet (80 mg total) by mouth every 12 (twelve) hours.   60 tablet   0   . venlafaxine XR (EFFEXOR-XR) 150 MG 24 hr capsule   Oral   Take 1 capsule (150 mg total) by mouth daily with lunch.   30 capsule   0     Allergies Amlodipine and Nexium  Family History  Problem Relation Age of Onset  . Breast cancer Mother   .  Diabetes Mellitus II Mother   . Hypothyroidism  Mother   . Atrial fibrillation Mother   . Heart attack Father     Social History Social History  Substance Use Topics  . Smoking status: Never Smoker   . Smokeless tobacco: None  . Alcohol Use: No    Review of Systems Constitutional: No fever/chills Eyes: No visual changes. ENT: No sore throat. Cardiovascular: Denies chest pain. Respiratory: Denies shortness of breath. Gastrointestinal:  abdominal pain and nausea, no vomiting. Some diarrhea  No constipation. Genitourinary: Negative for dysuria. Musculoskeletal: Negative for back pain. Skin: Negative for rash. Neurological: Negative for headaches, focal weakness or numbness.  10-point ROS otherwise negative.  ____________________________________________   PHYSICAL EXAM:  VITAL SIGNS: ED Triage Vitals  Enc Vitals Group     BP 11/14/15 2251 155/50 mmHg     Pulse Rate 11/14/15 2251 81     Resp 11/14/15 2251 16     Temp 11/14/15 2251 98.3 F (36.8 C)     Temp Source 11/14/15 2251 Oral     SpO2 11/14/15 2251 96 %     Weight 11/14/15 2251 160 lb (72.576 kg)     Height --      Head Cir --      Peak Flow --      Pain Score 11/14/15 2252 7     Pain Loc --      Pain Edu? --      Excl. in [place]? --     Constitutional: Alert and oriented. Well appearing and in moderate distress. Eyes: Conjunctivae are normal. PERRL. EOMI. Head: Atraumatic. Nose: No congestion/rhinnorhea. Mouth/Throat: Mucous membranes are moist.  Oropharynx non-erythematous. Cardiovascular: Normal rate, regular rhythm. Grossly normal heart sounds.  Good peripheral circulation. Respiratory: Normal respiratory effort.  No retractions. Lungs CTAB. Gastrointestinal: Soft with diffuse tenderness to palpation and some mild distention.  Positive bowel sounds Musculoskeletal: No lower extremity tenderness nor edema.   Neurologic:  Normal speech and language.  Skin:  Skin is warm, dry and intact.  Psychiatric: Mood and affect are normal.    ____________________________________________   LABS (all labs ordered are listed, but only abnormal results are displayed)  Labs Reviewed  COMPREHENSIVE METABOLIC PANEL - Abnormal; Notable for the following:    Glucose, Bld 123 (*)    Total Bilirubin 1.3 (*)    GFR calc non Af Amer 55 (*)    All other components within normal limits  CBC - Abnormal; Notable for the following:    WBC 23.4 (*)    RBC 5.26 (*)    MCV 78.3 (*)    MCH 25.7 (*)    RDW 18.1 (*)    All other components within normal limits  URINALYSIS COMPLETEWITH MICROSCOPIC ([facility] ONLY) - Abnormal; Notable for the following:    Color, Urine YELLOW (*)    APPearance CLEAR (*)    Squamous Epithelial / LPF 0-5 (*)    All other components within normal limits  LIPASE, BLOOD  LACTIC ACID, PLASMA   ____________________________________________  EKG  None ____________________________________________  RADIOLOGY  CT abd and pelvis: Diffuse infiltration or inflammation in the low pelvis likely representing diverticulitis at the rectosigmoid junction, no abscess ____________________________________________   PROCEDURES  Procedure(s) performed: None  Critical Care performed: No  ____________________________________________   INITIAL IMPRESSION / ASSESSMENT AND PLAN / ED COURSE  Pertinent labs & imaging results that were available during my care of the patient were reviewed by me and considered in my medical decision making (  see chart for details).  This is a 76 year old female who comes into the hospital today with lower abdominal pain and the concern that she has diverticulitis. The patient will be given some fluid, morphine and Zofran and she will receive a CT scan to evaluate her abdomen.  The patient's abdominal pain is improved. I did give her some ciprofloxacin and Flagyl. Since she is not vomiting and her pain is improved she'll be discharged to follow-up with her primary care  physician. ____________________________________________   FINAL CLINICAL IMPRESSION(S) / ED DIAGNOSES  Final diagnoses:  Diverticulitis of large intestine without perforation or abscess without bleeding      Loney Hering, MD 11/15/15 [phone]

## 2015-11-15 NOTE — Discharge Instructions (Signed)
Diverticulitis Diverticulitis is inflammation or infection of small pouches in your colon that form when you have a condition called diverticulosis. The pouches in your colon are called diverticula. Your colon, or large intestine, is where water is absorbed and stool is formed. Complications of diverticulitis can include:  Bleeding.  Severe infection.  Severe pain.  Perforation of your colon.  Obstruction of your colon. CAUSES  Diverticulitis is caused by bacteria. Diverticulitis happens when stool becomes trapped in diverticula. This allows bacteria to grow in the diverticula, which can lead to inflammation and infection. RISK FACTORS People with diverticulosis are at risk for diverticulitis. Eating a diet that does not include enough fiber from fruits and vegetables may make diverticulitis more likely to develop. SYMPTOMS  Symptoms of diverticulitis may include:  Abdominal pain and tenderness. The pain is normally located on the left side of the abdomen, but may occur in other areas.  Fever and chills.  Bloating.  Cramping.  Nausea.  Vomiting.  Constipation.  Diarrhea.  Blood in your stool. DIAGNOSIS  Your health care provider will ask you about your medical history and do a physical exam. You may need to have tests done because many medical conditions can cause the same symptoms as diverticulitis. Tests may include:  Blood tests.  Urine tests.  Imaging tests of the abdomen, including X-rays and CT scans. When your condition is under control, your health care provider may recommend that you have a colonoscopy. A colonoscopy can show how severe your diverticula are and whether something else is causing your symptoms. TREATMENT  Most cases of diverticulitis are mild and can be treated at home. Treatment may include:  Taking over-the-counter pain medicines.  Following a clear liquid diet.  Taking antibiotic medicines by mouth for 7-10 days. More severe cases may  be treated at a hospital. Treatment may include:  Not eating or drinking.  Taking prescription pain medicine.  Receiving antibiotic medicines through an IV tube.  Receiving fluids and nutrition through an IV tube.  Surgery. HOME CARE INSTRUCTIONS   Follow your health care provider's instructions carefully.  Follow a full liquid diet or other diet as directed by your health care provider. After your symptoms improve, your health care provider may tell you to change your diet. He or she may recommend you eat a high-fiber diet. Fruits and vegetables are good sources of fiber. Fiber makes it easier to pass stool.  Take fiber supplements or probiotics as directed by your health care provider.  Only take medicines as directed by your health care provider.  Keep all your follow-up appointments. SEEK MEDICAL CARE IF:   Your pain does not improve.  You have a hard time eating food.  Your bowel movements do not return to normal. SEEK IMMEDIATE MEDICAL CARE IF:   Your pain becomes worse.  Your symptoms do not get better.  Your symptoms suddenly get worse.  You have a fever.  You have repeated vomiting.  You have bloody or black, tarry stools. MAKE SURE YOU:   Understand these instructions.  Will watch your condition.  Will get help right away if you are not doing well or get worse.   This information is not intended to replace advice given to you by your health care provider. Make sure you discuss any questions you have with your health care provider.   Document Released: 06/27/2005 Document Revised: 09/22/2013 Document Reviewed: 08/12/2013 Elsevier Interactive Patient Education [employer].

## 2015-11-15 NOTE — ED Notes (Addendum)
Pt ambulatory to room 25 without difficulty or distress noted; pt reports seen here Thursday for abd pain with no dx; rx pain med but symptoms persists; c/o mid lower abd pain (pt indicates suprapubic area) with no accomp symptoms; +BS, abd soft/nondist/nontender with palpation

## 2015-11-24 ENCOUNTER — Ambulatory Visit: Payer: PPO | Admitting: Surgery

## 2015-11-25 ENCOUNTER — Emergency Department: Payer: Medicare Other

## 2015-11-25 ENCOUNTER — Inpatient Hospital Stay
Admission: EM | Admit: 2015-11-25 | Discharge: 2015-11-29 | DRG: 535 | Disposition: A | Discharge status: Skilled Nursing Facility | Payer: Medicare Other | Attending: Internal Medicine | Admitting: Internal Medicine

## 2015-11-25 ENCOUNTER — Encounter: Payer: Self-pay | Admitting: Emergency Medicine

## 2015-11-25 DIAGNOSIS — E782 Mixed hyperlipidemia: Secondary | ICD-10-CM | POA: Diagnosis present

## 2015-11-25 DIAGNOSIS — S72111A Displaced fracture of greater trochanter of right femur, initial encounter for closed fracture: Principal | ICD-10-CM

## 2015-11-25 DIAGNOSIS — E119 Type 2 diabetes mellitus without complications: Secondary | ICD-10-CM

## 2015-11-25 DIAGNOSIS — E11649 Type 2 diabetes mellitus with hypoglycemia without coma: Secondary | ICD-10-CM | POA: Diagnosis present

## 2015-11-25 DIAGNOSIS — K5732 Diverticulitis of large intestine without perforation or abscess without bleeding: Secondary | ICD-10-CM

## 2015-11-25 DIAGNOSIS — J101 Influenza due to other identified influenza virus with other respiratory manifestations: Secondary | ICD-10-CM | POA: Diagnosis present

## 2015-11-25 DIAGNOSIS — Z833 Family history of diabetes mellitus: Secondary | ICD-10-CM

## 2015-11-25 DIAGNOSIS — W19XXXA Unspecified fall, initial encounter: Secondary | ICD-10-CM | POA: Diagnosis present

## 2015-11-25 DIAGNOSIS — E860 Dehydration: Secondary | ICD-10-CM | POA: Diagnosis present

## 2015-11-25 DIAGNOSIS — E039 Hypothyroidism, unspecified: Secondary | ICD-10-CM | POA: Diagnosis present

## 2015-11-25 DIAGNOSIS — I129 Hypertensive chronic kidney disease with stage 1 through stage 4 chronic kidney disease, or unspecified chronic kidney disease: Secondary | ICD-10-CM | POA: Diagnosis present

## 2015-11-25 DIAGNOSIS — J189 Pneumonia, unspecified organism: Secondary | ICD-10-CM

## 2015-11-25 DIAGNOSIS — Z90710 Acquired absence of both cervix and uterus: Secondary | ICD-10-CM

## 2015-11-25 DIAGNOSIS — N183 Chronic kidney disease, stage 3 (moderate): Secondary | ICD-10-CM | POA: Diagnosis present

## 2015-11-25 DIAGNOSIS — R197 Diarrhea, unspecified: Secondary | ICD-10-CM

## 2015-11-25 DIAGNOSIS — I4891 Unspecified atrial fibrillation: Secondary | ICD-10-CM | POA: Diagnosis present

## 2015-11-25 DIAGNOSIS — R0989 Other specified symptoms and signs involving the circulatory and respiratory systems: Secondary | ICD-10-CM

## 2015-11-25 DIAGNOSIS — J209 Acute bronchitis, unspecified: Secondary | ICD-10-CM | POA: Diagnosis present

## 2015-11-25 DIAGNOSIS — Z9841 Cataract extraction status, right eye: Secondary | ICD-10-CM

## 2015-11-25 DIAGNOSIS — E1122 Type 2 diabetes mellitus with diabetic chronic kidney disease: Secondary | ICD-10-CM | POA: Diagnosis present

## 2015-11-25 DIAGNOSIS — K589 Irritable bowel syndrome without diarrhea: Secondary | ICD-10-CM | POA: Diagnosis present

## 2015-11-25 DIAGNOSIS — A09 Infectious gastroenteritis and colitis, unspecified: Secondary | ICD-10-CM | POA: Diagnosis not present

## 2015-11-25 DIAGNOSIS — Z9889 Other specified postprocedural states: Secondary | ICD-10-CM

## 2015-11-25 DIAGNOSIS — J9601 Acute respiratory failure with hypoxia: Secondary | ICD-10-CM

## 2015-11-25 DIAGNOSIS — Z9049 Acquired absence of other specified parts of digestive tract: Secondary | ICD-10-CM

## 2015-11-25 DIAGNOSIS — Z961 Presence of intraocular lens: Secondary | ICD-10-CM | POA: Diagnosis present

## 2015-11-25 DIAGNOSIS — Y92019 Unspecified place in single-family (private) house as the place of occurrence of the external cause: Secondary | ICD-10-CM

## 2015-11-25 DIAGNOSIS — K219 Gastro-esophageal reflux disease without esophagitis: Secondary | ICD-10-CM | POA: Diagnosis present

## 2015-11-25 DIAGNOSIS — Z8249 Family history of ischemic heart disease and other diseases of the circulatory system: Secondary | ICD-10-CM

## 2015-11-25 DIAGNOSIS — Z79899 Other long term (current) drug therapy: Secondary | ICD-10-CM

## 2015-11-25 DIAGNOSIS — Z888 Allergy status to other drugs, medicaments and biological substances status: Secondary | ICD-10-CM

## 2015-11-25 DIAGNOSIS — I480 Paroxysmal atrial fibrillation: Secondary | ICD-10-CM | POA: Diagnosis present

## 2015-11-25 DIAGNOSIS — Z7901 Long term (current) use of anticoagulants: Secondary | ICD-10-CM

## 2015-11-25 DIAGNOSIS — Z803 Family history of malignant neoplasm of breast: Secondary | ICD-10-CM

## 2015-11-25 HISTORY — DX: Noninfective gastroenteritis and colitis, unspecified: K52.9

## 2015-11-25 HISTORY — DX: Unspecified atrial fibrillation: I48.91

## 2015-11-25 LAB — HEPATIC FUNCTION PANEL
ALK PHOS: 74 U/L (ref 38–126)
ALT: 15 U/L (ref 14–54)
AST: 37 U/L (ref 15–41)
Albumin: 3.7 g/dL (ref 3.5–5.0)
BILIRUBIN TOTAL: 0.5 mg/dL (ref 0.3–1.2)
Total Protein: 6.8 g/dL (ref 6.5–8.1)

## 2015-11-25 LAB — BASIC METABOLIC PANEL
ANION GAP: 9 (ref 5–15)
BUN: 15 mg/dL (ref 6–20)
CALCIUM: 8.7 mg/dL — AB (ref 8.9–10.3)
CHLORIDE: 100 mmol/L — AB (ref 101–111)
CO2: 24 mmol/L (ref 22–32)
Creatinine, Ser: 1.2 mg/dL — ABNORMAL HIGH (ref 0.44–1.00)
GFR calc Af Amer: 50 mL/min — ABNORMAL LOW (ref >60)
GFR calc non Af Amer: 43 mL/min — ABNORMAL LOW (ref >60)
GLUCOSE: 201 mg/dL — AB (ref 65–99)
Potassium: 3.7 mmol/L (ref 3.5–5.1)
Sodium: 133 mmol/L — ABNORMAL LOW (ref 135–145)

## 2015-11-25 LAB — CBC
HEMATOCRIT: 42.9 % (ref 35.0–47.0)
HEMOGLOBIN: 13.8 g/dL (ref 12.0–16.0)
MCH: 26.1 pg (ref 26.0–34.0)
MCHC: 32.2 g/dL (ref 32.0–36.0)
MCV: 81.2 fL (ref 80.0–100.0)
Platelets: 374 10*3/uL (ref 150–440)
RBC: 5.28 MIL/uL — AB (ref 3.80–5.20)
RDW: 17.8 % — ABNORMAL HIGH (ref 11.5–14.5)
WBC: 12.8 10*3/uL — ABNORMAL HIGH (ref 3.6–11.0)

## 2015-11-25 LAB — LIPASE, BLOOD: LIPASE: 15 U/L (ref 11–51)

## 2015-11-25 LAB — CK: CK TOTAL: 137 U/L (ref 38–234)

## 2015-11-25 MED ORDER — PIPERACILLIN-TAZOBACTAM 3.375 G IVPB 30 MIN
3.3750 g | Freq: Once | INTRAVENOUS | Status: AC
  Administered 2015-11-25: 3.375 g via INTRAVENOUS
  Filled 2015-11-25: qty 50

## 2015-11-25 MED ORDER — SODIUM CHLORIDE 0.9 % IV BOLUS (SEPSIS)
1000.0000 mL | Freq: Once | INTRAVENOUS | Status: AC
  Administered 2015-11-25: 1000 mL via INTRAVENOUS

## 2015-11-25 MED ORDER — IOHEXOL 240 MG/ML SOLN
25.0000 mL | INTRAMUSCULAR | Status: DC
  Administered 2015-11-25: 25 mL via ORAL

## 2015-11-25 MED ORDER — IOHEXOL 300 MG/ML  SOLN
80.0000 mL | Freq: Once | INTRAMUSCULAR | Status: AC | PRN
Start: 2015-11-25 — End: 2015-11-25
  Administered 2015-11-25: 80 mL via INTRAVENOUS

## 2015-11-25 NOTE — ED Notes (Signed)
Patient returned from CT

## 2015-11-25 NOTE — ED Provider Notes (Signed)
Not improved. Now hypoxic to 87% on room air. Nasal cannula oxygen, flu swab, admit.  Carrie Mew, MD 11/25/15 [phone]

## 2015-11-25 NOTE — ED Notes (Signed)
CT dropped of first bottle of contrast with patient

## 2015-11-25 NOTE — ED Provider Notes (Signed)
[facility] Emergency Department Provider Note  ____________________________________________  Time seen: 7:25 PM  I have reviewed the triage vital signs and the nursing notes.   HISTORY  Chief Complaint Fall and Weakness    HPI NASIYAH LAVERDIERE is a 75 y.o. female brought to the ED due to generalized weakness and fall today. She has recent treated with diverticulitis and completed her last dose of Cipro Flagyl today. She's also been amoxicillin for 3 days for bronchitis. Today she's been very weak and had ongoing diarrhea. At 1 PM she fell down and landed on her right hip. Son came and found her still on the floor at 5 PM after 4 hours. She thinks that she lost consciousness when she fell but is not sure if she hit her head. No headache or neck pain right now.     Past Medical History  Diagnosis Date  . Irregular heart beat   . Hypertension   . IBS (irritable bowel syndrome)   . Shortness of breath dyspnea   . GERD (gastroesophageal reflux disease)   . Interstitial lung disease ([place])   . Seasonal affective disorder ([place])   . Type 2 diabetes mellitus ([place]) 11/04/2014  . IBS (irritable bowel syndrome)   . Diverticulitis      Patient Active Problem List   Diagnosis Date Noted  . OP (osteoporosis) 07/18/2015  . 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 Chemical diabetes ([place]) 07/18/2015  . Adult hypothyroidism 07/18/2015  . BP (high blood pressure) 07/18/2015  . Fatigue 07/18/2015  . Anxiety 07/18/2015  . Diverticulitis large intestine 07/04/2015  . Paroxysmal atrial fibrillation ([place]) 04/22/2015  . Clinical depression 04/22/2015  . Atrial fibrillation with rapid ventricular response ([place]) 03/25/2015  . Atrial fibrillation ([place]) 03/25/2015  . Acquired hypothyroidism 01/11/2015  . Anemia, iron deficiency 01/10/2015  . Benign essential HTN 01/10/2015  . Type 2 diabetes mellitus ([place]) 11/04/2014  . 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 Essential (primary) hypertension 11/04/2014  . Temporary cerebral vascular dysfunction 04/08/2014  . Dry mouth 01/05/2013  . Cannot sleep 01/05/2013  . Obstructive apnea 09/02/2012  . Bursitis, ischial 05/19/2012  . Acid reflux 02/01/2012  . DD (diverticular disease) 08/14/2011     Past Surgical History  Procedure Laterality Date  . Nissen fundoplication    . Abdominal hysterectomy      partial  . Appendectomy    . Tonsillectomy      and adenoidectomy  . Colonoscopy    . Cataract extraction w/phaco Right 03/16/2015    Procedure: CATARACT EXTRACTION PHACO AND INTRAOCULAR LENS PLACEMENT (IOC);  Surgeon: Leandrew Koyanagi, MD;  Location: [facility];  Service: Ophthalmology;  Laterality: Right;     Current Outpatient Rx  Name  Route  Sig  Dispense  Refill  . amoxicillin-clavulanate (AUGMENTIN) 875-125 MG tablet   Oral   Take 1 tablet by mouth every 12 (twelve) hours.   20 tablet   0   . apixaban (ELIQUIS) 5 MG TABS tablet   Oral   Take 5 mg by mouth 2 (two) times daily.         . Calcium Carbonate-Vitamin D (CALCIUM 600+D) 600-200 MG-UNIT TABS   Oral   Take 1 tablet by mouth daily.         . cholestyramine (QUESTRAN) 4 GM/DOSE powder   Oral   Take 4 g by mouth daily.          . ciprofloxacin (CIPRO) 500 MG tablet               .  ciprofloxacin (CIPRO) 500 MG tablet   Oral   Take 1 tablet (500 mg total) by mouth 2 (two) times daily.   20 tablet   0   . dicyclomine (BENTYL) 20 MG tablet   Oral   Take 1 tablet (20 mg total) by mouth 3 (three) times daily as needed for spasms.   30 tablet   0   . glimepiride (AMARYL) 2 MG tablet   Oral   Take by mouth.         . hydrALAZINE (APRESOLINE) 25 MG tablet   Oral   Take 25 mg by mouth 3 (three) times daily.         Marland Kitchen levothyroxine (SYNTHROID, LEVOTHROID) 50 MCG tablet   Oral   Take 50 mcg by mouth daily before breakfast.         . losartan (COZAAR) 100 MG tablet   Oral    Take 100 mg by mouth daily.          . metoprolol (LOPRESSOR) 100 MG tablet   Oral   Take 100 mg by mouth 2 (two) times daily.         . metroNIDAZOLE (FLAGYL) 500 MG tablet               . metroNIDAZOLE (FLAGYL) 500 MG tablet   Oral   Take 1 tablet (500 mg total) by mouth 2 (two) times daily.   20 tablet   0   . Multiple Vitamin (MULTIVITAMIN WITH MINERALS) TABS tablet   Oral   Take 1 tablet by mouth daily.         . ondansetron (ZOFRAN ODT) 4 MG disintegrating tablet   Oral   Take 1 tablet (4 mg total) by mouth every 8 (eight) hours as needed for nausea or vomiting.   20 tablet   0   . ondansetron (ZOFRAN ODT) 8 MG disintegrating tablet   Oral   Take 1 tablet (8 mg total) by mouth every 8 (eight) hours as needed for nausea or vomiting.   20 tablet   0   . oxyCODONE-acetaminophen (ROXICET) 5-325 MG tablet   Oral   Take 1 tablet by mouth every 6 (six) hours as needed for severe pain.   12 tablet   0   . oxyCODONE-acetaminophen (ROXICET) 5-325 MG tablet   Oral   Take 1 tablet by mouth every 6 (six) hours as needed.   12 tablet   0   . pantoprazole (PROTONIX) 40 MG tablet   Oral   Take 1 tablet (40 mg total) by mouth daily.   90 tablet   1   . sotalol (BETAPACE) 80 MG tablet   Oral   Take 1 tablet (80 mg total) by mouth every 12 (twelve) hours.   60 tablet   0   . venlafaxine XR (EFFEXOR-XR) 150 MG 24 hr capsule   Oral   Take 1 capsule (150 mg total) by mouth daily with lunch.   30 capsule   0      Allergies Amlodipine and Nexium   Family History  Problem Relation Age of Onset  . Breast cancer Mother   . Diabetes Mellitus II Mother   . Hypothyroidism Mother   . Atrial fibrillation Mother   . Heart attack Father     Social History Social History  Substance Use Topics  . Smoking status: Never Smoker   . Smokeless tobacco: None  . Alcohol Use: No    Review of Systems  Constitutional:   No fever or chills. No weight changes.  Generalized weakness Eyes:   No blurry vision or double vision.  ENT:   No sore throat.  Cardiovascular:   No chest pain. Respiratory:   No dyspnea or cough. Gastrointestinal:   Left lower quadrant abdominal pain with diarrhea. No vomiting.  No BRBPR or melena. Genitourinary:   Negative for dysuria or difficulty urinating. Musculoskeletal:   Negative for back pain. No joint swelling or pain. Skin:   Negative for rash. Neurological:   Negative for headaches, focal weakness or numbness. Psychiatric:  No anxiety or depression.   Endocrine:  No changes in energy or sleep difficulty.  10-point ROS otherwise negative.  ____________________________________________   PHYSICAL EXAM:  VITAL SIGNS: ED Triage Vitals  Enc Vitals Group     BP 11/25/15 1923 108/67 mmHg     Pulse Rate 11/25/15 1921 83     Resp 11/25/15 1921 14     Temp 11/25/15 1921 98.1 F (36.7 C)     Temp Source 11/25/15 1921 Oral     SpO2 11/25/15 1921 99 %     Weight 11/25/15 1921 145 lb (65.772 kg)     Height 11/25/15 1921 _0  (1.626 m)     Head Cir --      Peak Flow --      Pain Score --      Pain Loc --      Pain Edu? --      Excl. in [place]? --     Vital signs reviewed, nursing assessments reviewed.   Constitutional:   Alert and oriented. Well appearing and in no distress. Eyes:   No scleral icterus. No conjunctival pallor. PERRL. EOMI ENT   Head:   Normocephalic and atraumatic.   Nose:   No congestion/rhinnorhea. No septal hematoma   Mouth/Throat:   Dry mucous membranes, no pharyngeal erythema. No peritonsillar mass.    Neck:   No stridor. No SubQ emphysema. No meningismus. Hematological/Lymphatic/Immunilogical:   No cervical lymphadenopathy. Cardiovascular:   RRR. Symmetric bilateral radial and DP pulses.  No murmurs.  Respiratory:   Normal respiratory effort without tachypnea nor retractions. Breath sounds are clear and equal bilaterally. No wheezes/rales/rhonchi. Gastrointestinal:   Soft  with left lower quadrant tenderness. Non distended. There is no CVA tenderness.  No rebound, rigidity, or guarding. Genitourinary:   deferred Musculoskeletal:   Tenderness over the right greater trochanter. No pain with hip movement or leg rotation. Other extremities unremarkable. No midline spinal tenderness. Neurologic:   Normal speech and language.  CN 2-10 normal. Motor grossly intact. No gross focal neurologic deficits are appreciated.  Skin:    Skin is warm, dry and intact. No rash noted.  No petechiae, purpura, or bullae. Psychiatric:   Mood and affect are normal. ____________________________________________    LABS (pertinent positives/negatives) (all labs ordered are listed, but only abnormal results are displayed) Labs Reviewed  BASIC METABOLIC PANEL - Abnormal; Notable for the following:    Sodium 133 (*)    Chloride 100 (*)    Glucose, Bld 201 (*)    Creatinine, Ser 1.20 (*)    Calcium 8.7 (*)    GFR calc non Af Amer 43 (*)    GFR calc Af Amer 50 (*)    All other components within normal limits  CBC - Abnormal; Notable for the following:    WBC 12.8 (*)    RBC 5.28 (*)    RDW 17.8 (*)    All  other components within normal limits  HEPATIC FUNCTION PANEL - Abnormal; Notable for the following:    Bilirubin, Direct <0.1 (*)    All other components within normal limits  C DIFFICILE QUICK SCREEN W PCR REFLEX  CK  LIPASE, BLOOD  URINALYSIS COMPLETEWITH MICROSCOPIC ([facility] ONLY)  CBG MONITORING, ED   ____________________________________________   EKG    ____________________________________________    RADIOLOGY  CT head unremarkable X-ray right hip shows greater trochanteric fracture nondisplaced. 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 She has persistence of previous diverticulitis.  ____________________________________________   PROCEDURES   ____________________________________________   INITIAL  IMPRESSION / ASSESSMENT AND PLAN / ED COURSE  Pertinent labs & imaging results that were available during my care of the patient were reviewed by me and considered in my medical decision making (see chart for details).  Patient presents with generalized weakness with a fall and inability to get up and take care of herself. Vital signs are okay, the is having ongoing diarrhea. Electrolytes appear to be okay but she does seem to be dehydrated clinically and based on labs. She still has a little bit of leukocytosis of almost 13,000.  Still awaiting C. difficile testing. We'll give him IV Zosyn and continue fluids. Also awaiting urinalysis. We'll need to test for ambulation after rehydration.  Care of the patient signed out to Dr. Burlene Arnt at 11:30 PM    ____________________________________________   FINAL CLINICAL IMPRESSION(S) / ED DIAGNOSES  Final diagnoses:  Greater trochanter fracture, right, closed, initial encounter  Diverticulitis of large intestine without perforation or abscess without bleeding  Diarrhea of presumed infectious origin      Carrie Mew, MD 11/25/15 2322

## 2015-11-25 NOTE — ED Notes (Signed)
Patient transported to CT 

## 2015-11-25 NOTE — ED Notes (Signed)
Called CT to tell them she has completed her contrast

## 2015-11-25 NOTE — ED Notes (Signed)
XR at bedside

## 2015-11-25 NOTE — ED Notes (Signed)
Pt reports being diagnosed with bronchitis, and diverticulitis. Pt being treated with Cipro/flagyl (last doses today), and on amoxicillin for 3 days. Pt c/o of weakness and diarrhea. Pt reports she fell at approx 1pm today, and hit her right hip.  Pt's son reports he found her on the floor at 5pm. Pt unsure if she hit head. Pt believes she lost consciousness.

## 2015-11-26 ENCOUNTER — Encounter: Payer: Self-pay | Admitting: Internal Medicine

## 2015-11-26 DIAGNOSIS — Z79899 Other long term (current) drug therapy: Secondary | ICD-10-CM | POA: Diagnosis not present

## 2015-11-26 DIAGNOSIS — Z9841 Cataract extraction status, right eye: Secondary | ICD-10-CM | POA: Diagnosis not present

## 2015-11-26 DIAGNOSIS — Z9889 Other specified postprocedural states: Secondary | ICD-10-CM | POA: Diagnosis not present

## 2015-11-26 DIAGNOSIS — J209 Acute bronchitis, unspecified: Secondary | ICD-10-CM | POA: Diagnosis present

## 2015-11-26 DIAGNOSIS — Z888 Allergy status to other drugs, medicaments and biological substances status: Secondary | ICD-10-CM | POA: Diagnosis not present

## 2015-11-26 DIAGNOSIS — Z90710 Acquired absence of both cervix and uterus: Secondary | ICD-10-CM | POA: Diagnosis not present

## 2015-11-26 DIAGNOSIS — K5732 Diverticulitis of large intestine without perforation or abscess without bleeding: Secondary | ICD-10-CM | POA: Diagnosis present

## 2015-11-26 DIAGNOSIS — Z9049 Acquired absence of other specified parts of digestive tract: Secondary | ICD-10-CM | POA: Diagnosis not present

## 2015-11-26 DIAGNOSIS — E860 Dehydration: Secondary | ICD-10-CM | POA: Diagnosis present

## 2015-11-26 DIAGNOSIS — N183 Chronic kidney disease, stage 3 (moderate): Secondary | ICD-10-CM | POA: Diagnosis present

## 2015-11-26 DIAGNOSIS — Y92019 Unspecified place in single-family (private) house as the place of occurrence of the external cause: Secondary | ICD-10-CM | POA: Diagnosis not present

## 2015-11-26 DIAGNOSIS — Z961 Presence of intraocular lens: Secondary | ICD-10-CM | POA: Diagnosis present

## 2015-11-26 DIAGNOSIS — K219 Gastro-esophageal reflux disease without esophagitis: Secondary | ICD-10-CM | POA: Diagnosis present

## 2015-11-26 DIAGNOSIS — J101 Influenza due to other identified influenza virus with other respiratory manifestations: Secondary | ICD-10-CM | POA: Diagnosis present

## 2015-11-26 DIAGNOSIS — I129 Hypertensive chronic kidney disease with stage 1 through stage 4 chronic kidney disease, or unspecified chronic kidney disease: Secondary | ICD-10-CM | POA: Diagnosis present

## 2015-11-26 DIAGNOSIS — W19XXXA Unspecified fall, initial encounter: Secondary | ICD-10-CM | POA: Diagnosis present

## 2015-11-26 DIAGNOSIS — Z7901 Long term (current) use of anticoagulants: Secondary | ICD-10-CM | POA: Diagnosis not present

## 2015-11-26 DIAGNOSIS — Z833 Family history of diabetes mellitus: Secondary | ICD-10-CM | POA: Diagnosis not present

## 2015-11-26 DIAGNOSIS — K589 Irritable bowel syndrome without diarrhea: Secondary | ICD-10-CM | POA: Diagnosis present

## 2015-11-26 DIAGNOSIS — J9601 Acute respiratory failure with hypoxia: Secondary | ICD-10-CM | POA: Diagnosis present

## 2015-11-26 DIAGNOSIS — E782 Mixed hyperlipidemia: Secondary | ICD-10-CM | POA: Diagnosis present

## 2015-11-26 DIAGNOSIS — I4891 Unspecified atrial fibrillation: Secondary | ICD-10-CM | POA: Diagnosis present

## 2015-11-26 DIAGNOSIS — S72111A Displaced fracture of greater trochanter of right femur, initial encounter for closed fracture: Secondary | ICD-10-CM | POA: Diagnosis present

## 2015-11-26 DIAGNOSIS — A09 Infectious gastroenteritis and colitis, unspecified: Secondary | ICD-10-CM | POA: Diagnosis present

## 2015-11-26 DIAGNOSIS — E1122 Type 2 diabetes mellitus with diabetic chronic kidney disease: Secondary | ICD-10-CM | POA: Diagnosis present

## 2015-11-26 DIAGNOSIS — E11649 Type 2 diabetes mellitus with hypoglycemia without coma: Secondary | ICD-10-CM | POA: Diagnosis present

## 2015-11-26 DIAGNOSIS — E039 Hypothyroidism, unspecified: Secondary | ICD-10-CM | POA: Diagnosis present

## 2015-11-26 DIAGNOSIS — Z803 Family history of malignant neoplasm of breast: Secondary | ICD-10-CM | POA: Diagnosis not present

## 2015-11-26 DIAGNOSIS — Z8249 Family history of ischemic heart disease and other diseases of the circulatory system: Secondary | ICD-10-CM | POA: Diagnosis not present

## 2015-11-26 LAB — URINALYSIS COMPLETE WITH MICROSCOPIC (ARMC ONLY)
Bilirubin Urine: NEGATIVE
GLUCOSE, UA: NEGATIVE mg/dL
HGB URINE DIPSTICK: NEGATIVE
NITRITE: NEGATIVE
Protein, ur: NEGATIVE mg/dL
Specific Gravity, Urine: 1.06 — ABNORMAL HIGH (ref 1.005–1.030)
pH: 5 (ref 5.0–8.0)

## 2015-11-26 LAB — CBC
HEMATOCRIT: 33.4 % — AB (ref 35.0–47.0)
Hemoglobin: 11 g/dL — ABNORMAL LOW (ref 12.0–16.0)
MCH: 26.6 pg (ref 26.0–34.0)
MCHC: 32.9 g/dL (ref 32.0–36.0)
MCV: 80.8 fL (ref 80.0–100.0)
PLATELETS: 305 10*3/uL (ref 150–440)
RBC: 4.13 MIL/uL (ref 3.80–5.20)
RDW: 17.9 % — ABNORMAL HIGH (ref 11.5–14.5)
WBC: 8.3 10*3/uL (ref 3.6–11.0)

## 2015-11-26 LAB — RAPID INFLUENZA A&B ANTIGENS (ARMC ONLY): INFLUENZA B (ARMC): NOT DETECTED

## 2015-11-26 LAB — BASIC METABOLIC PANEL
Anion gap: 7 (ref 5–15)
BUN: 11 mg/dL (ref 6–20)
CHLORIDE: 105 mmol/L (ref 101–111)
CO2: 24 mmol/L (ref 22–32)
CREATININE: 0.93 mg/dL (ref 0.44–1.00)
Calcium: 7.5 mg/dL — ABNORMAL LOW (ref 8.9–10.3)
GFR calc non Af Amer: 59 mL/min — ABNORMAL LOW (ref >60)
Glucose, Bld: 119 mg/dL — ABNORMAL HIGH (ref 65–99)
POTASSIUM: 3.2 mmol/L — AB (ref 3.5–5.1)
Sodium: 136 mmol/L (ref 135–145)

## 2015-11-26 LAB — GLUCOSE, CAPILLARY
GLUCOSE-CAPILLARY: 108 mg/dL — AB (ref 65–99)
GLUCOSE-CAPILLARY: 63 mg/dL — AB (ref 65–99)
GLUCOSE-CAPILLARY: 69 mg/dL (ref 65–99)
GLUCOSE-CAPILLARY: 93 mg/dL (ref 65–99)
GLUCOSE-CAPILLARY: 122 mg/dL — AB (ref 65–99)
Glucose-Capillary: 57 mg/dL — ABNORMAL LOW (ref 65–99)
Glucose-Capillary: 61 mg/dL — ABNORMAL LOW (ref 65–99)
Glucose-Capillary: 88 mg/dL (ref 65–99)

## 2015-11-26 LAB — C DIFFICILE QUICK SCREEN W PCR REFLEX
C DIFFICILE (CDIFF) INTERP: NEGATIVE
C DIFFICILE (CDIFF) TOXIN: NEGATIVE
C DIFFICLE (CDIFF) ANTIGEN: NEGATIVE

## 2015-11-26 LAB — RAPID INFLUENZA A&B ANTIGENS: Influenza A (ARMC): DETECTED

## 2015-11-26 MED ORDER — SOTALOL HCL 80 MG PO TABS: 80.0000 mg | ORAL_TABLET | Freq: Two times a day (BID) | ORAL | Status: DC

## 2015-11-26 MED ORDER — LEVOTHYROXINE SODIUM 50 MCG PO TABS
50.0000 ug | ORAL_TABLET | Freq: Every day | ORAL | Status: DC
  Administered 2015-11-26 – 2015-11-29 (×4): 50 ug via ORAL
  Filled 2015-11-26 (×4): qty 1

## 2015-11-26 MED ORDER — VENLAFAXINE HCL ER 75 MG PO CP24
150.0000 mg | ORAL_CAPSULE | Freq: Every day | ORAL | Status: DC
  Administered 2015-11-26 – 2015-11-29 (×4): 150 mg via ORAL
  Filled 2015-11-26 (×4): qty 2

## 2015-11-26 MED ORDER — OSELTAMIVIR PHOSPHATE 75 MG PO CAPS: 75.0000 mg | ORAL_CAPSULE | Freq: Two times a day (BID) | ORAL | Status: DC

## 2015-11-26 MED ORDER — POTASSIUM CHLORIDE CRYS ER 20 MEQ PO TBCR
40.0000 meq | EXTENDED_RELEASE_TABLET | Freq: Once | ORAL | Status: AC
  Administered 2015-11-26: 40 meq via ORAL
  Filled 2015-11-26: qty 2

## 2015-11-26 MED ORDER — GLIMEPIRIDE 2 MG PO TABS
2.0000 mg | ORAL_TABLET | Freq: Every day | ORAL | Status: DC
  Administered 2015-11-26 – 2015-11-28 (×3): 2 mg via ORAL
  Filled 2015-11-26 (×3): qty 1

## 2015-11-26 MED ORDER — IPRATROPIUM-ALBUTEROL 0.5-2.5 (3) MG/3ML IN SOLN: 3.0000 mL | RESPIRATORY_TRACT | Status: DC | PRN

## 2015-11-26 MED ORDER — PREDNISONE 50 MG PO TABS
50.0000 mg | ORAL_TABLET | Freq: Every day | ORAL | Status: DC
  Administered 2015-11-27: 50 mg via ORAL
  Filled 2015-11-26 (×2): qty 1

## 2015-11-26 MED ORDER — METRONIDAZOLE 500 MG PO TABS
500.0000 mg | ORAL_TABLET | Freq: Two times a day (BID) | ORAL | Status: DC
  Administered 2015-11-26 – 2015-11-28 (×5): 500 mg via ORAL
  Filled 2015-11-26 (×5): qty 1

## 2015-11-26 MED ORDER — HYDRALAZINE HCL 25 MG PO TABS
25.0000 mg | ORAL_TABLET | Freq: Three times a day (TID) | ORAL | Status: DC
  Administered 2015-11-26 – 2015-11-29 (×9): 25 mg via ORAL
  Filled 2015-11-26 (×11): qty 1

## 2015-11-26 MED ORDER — PANTOPRAZOLE SODIUM 40 MG PO TBEC
40.0000 mg | DELAYED_RELEASE_TABLET | Freq: Every day | ORAL | Status: DC
  Administered 2015-11-26 – 2015-11-29 (×4): 40 mg via ORAL
  Filled 2015-11-26 (×4): qty 1

## 2015-11-26 MED ORDER — LEVOFLOXACIN IN D5W 750 MG/150ML IV SOLN
750.0000 mg | INTRAVENOUS | Status: DC
  Administered 2015-11-26 – 2015-11-27 (×2): 750 mg via INTRAVENOUS
  Filled 2015-11-26 (×3): qty 150

## 2015-11-26 MED ORDER — DILTIAZEM HCL 100 MG IV SOLR
5.0000 mg/h | INTRAVENOUS | Status: DC
  Administered 2015-11-26: 15 mg/h via INTRAVENOUS
  Administered 2015-11-26 – 2015-11-27 (×2): 5 mg/h via INTRAVENOUS
  Administered 2015-11-27: 10 mg/h via INTRAVENOUS
  Administered 2015-11-27: 15 mg/h via INTRAVENOUS
  Filled 2015-11-26 (×4): qty 100

## 2015-11-26 MED ORDER — POLYETHYLENE GLYCOL 3350 17 G PO PACK: 17.0000 g | PACK | Freq: Every day | ORAL | Status: DC | PRN

## 2015-11-26 MED ORDER — OXYCODONE HCL 5 MG PO TABS
5.0000 mg | ORAL_TABLET | ORAL | Status: DC | PRN
  Administered 2015-11-26 (×2): 5 mg via ORAL
  Filled 2015-11-26 (×3): qty 1

## 2015-11-26 MED ORDER — LEVOFLOXACIN IN D5W 750 MG/150ML IV SOLN: 750.0000 mg | INTRAVENOUS | Status: DC

## 2015-11-26 MED ORDER — SOTALOL HCL 80 MG PO TABS
80.0000 mg | ORAL_TABLET | Freq: Two times a day (BID) | ORAL | Status: DC
  Administered 2015-11-27 – 2015-11-29 (×5): 80 mg via ORAL
  Filled 2015-11-26 (×6): qty 1

## 2015-11-26 MED ORDER — LOSARTAN POTASSIUM 50 MG PO TABS
100.0000 mg | ORAL_TABLET | Freq: Every day | ORAL | Status: DC
  Administered 2015-11-26 – 2015-11-29 (×3): 100 mg via ORAL
  Filled 2015-11-26 (×4): qty 2

## 2015-11-26 MED ORDER — SOTALOL HCL 80 MG PO TABS
80.0000 mg | ORAL_TABLET | Freq: Two times a day (BID) | ORAL | Status: DC
  Administered 2015-11-26: 80 mg via ORAL
  Filled 2015-11-26 (×3): qty 1

## 2015-11-26 MED ORDER — LEVOFLOXACIN IN D5W 750 MG/150ML IV SOLN
750.0000 mg | Freq: Once | INTRAVENOUS | Status: AC
  Administered 2015-11-26: 750 mg via INTRAVENOUS
  Filled 2015-11-26 (×2): qty 150

## 2015-11-26 MED ORDER — SODIUM CHLORIDE 0.9% FLUSH
3.0000 mL | Freq: Two times a day (BID) | INTRAVENOUS | Status: DC
  Administered 2015-11-26 – 2015-11-29 (×8): 3 mL via INTRAVENOUS

## 2015-11-26 MED ORDER — INSULIN ASPART 100 UNIT/ML ~~LOC~~ SOLN: 0.0000 [IU] | Freq: Every day | SUBCUTANEOUS | Status: DC

## 2015-11-26 MED ORDER — DILTIAZEM HCL 25 MG/5ML IV SOLN
20.0000 mg | Freq: Once | INTRAVENOUS | Status: AC
  Administered 2015-11-26: 20 mg via INTRAVENOUS
  Filled 2015-11-26: qty 5

## 2015-11-26 MED ORDER — DICYCLOMINE HCL 20 MG PO TABS: 20.0000 mg | ORAL_TABLET | Freq: Three times a day (TID) | ORAL | Status: DC | PRN

## 2015-11-26 MED ORDER — ACETAMINOPHEN 650 MG RE SUPP: 650.0000 mg | Freq: Four times a day (QID) | RECTAL | Status: DC | PRN

## 2015-11-26 MED ORDER — APIXABAN 5 MG PO TABS
5.0000 mg | ORAL_TABLET | Freq: Two times a day (BID) | ORAL | Status: DC
  Administered 2015-11-27 – 2015-11-29 (×5): 5 mg via ORAL
  Filled 2015-11-26 (×5): qty 1

## 2015-11-26 MED ORDER — INSULIN ASPART 100 UNIT/ML ~~LOC~~ SOLN
0.0000 [IU] | Freq: Three times a day (TID) | SUBCUTANEOUS | Status: DC
  Administered 2015-11-28: 3 [IU] via SUBCUTANEOUS
  Administered 2015-11-28 – 2015-11-29 (×2): 4 [IU] via SUBCUTANEOUS
  Filled 2015-11-26: qty 3
  Filled 2015-11-26 (×2): qty 4

## 2015-11-26 MED ORDER — ACETAMINOPHEN 325 MG PO TABS
650.0000 mg | ORAL_TABLET | Freq: Four times a day (QID) | ORAL | Status: DC | PRN
Start: 2015-11-26 — End: 2015-11-29
  Administered 2015-11-26 – 2015-11-29 (×2): 650 mg via ORAL
  Filled 2015-11-26 (×2): qty 2

## 2015-11-26 NOTE — Progress Notes (Signed)
RRT team/ supervisor at the bedside. Placed pt on portable tele monitor. Charge Nurse spoke with Dr.Mody -orders received to given Cardizem and transfer patient to 2A.

## 2015-11-26 NOTE — Progress Notes (Signed)
Spoke with Dr. Nehemiah Massed about consult. Informed of patient vital signs and medications given. Ordered for Cardizem to be increased to 15 mg/hr at this time. Wilnette Kales

## 2015-11-26 NOTE — Consult Note (Signed)
Patient is a 75 year old who fell at home while getting out of the bathroom. She lives in a senior apartment and had handrails but just fell down on the right side. She had been not feeling well secondary to GI problems. On evaluation emergency room a CT scan of the abdomen and pelvis revealed a minimally displaced greater trochanter fracture and I'm consulted for evaluation of right greater trochanter fracture.  On exam she has significant pain with resisted abduction. She is able do a straight leg raise. She has minimal pain with logrolling. Neurovascular intact distally. Skin is intact.  CT was reviewed and shows minimally displaced greater trochanter fracture  Impression right greater trochanter fracture minimally displaced.  Plan: Physical therapy avoid resisted abduction exercises for about 6 weeks, partial weightbearing for about 3 weeks we'll. We will have her follow up in 3 weeks for an x-ray and make sure there is been no displacement and expect this to heal without operative intervention.

## 2015-11-26 NOTE — Progress Notes (Addendum)
[place] [facility] [place] NAME: Julie Mcmillan    MR#:  [id number]  DATE OF BIRTH:  1941/02/07  SUBJECTIVE:   Patient's symptoms have improved. She is coughing and able to produce sputum.  REVIEW OF SYSTEMS:    Review of Systems  Constitutional: Negative for fever, chills and malaise/fatigue.  HENT: Negative for sore throat.   Eyes: Negative for blurred vision.  Respiratory: Positive for cough and sputum production. Negative for hemoptysis, shortness of breath and wheezing.   Cardiovascular: Negative for chest pain, palpitations and leg swelling.  Gastrointestinal: Negative for nausea, vomiting, abdominal pain, diarrhea and blood in stool.  Genitourinary: Negative for dysuria.  Musculoskeletal: Positive for joint pain and falls. Negative for back pain.  Neurological: Negative for dizziness, tremors and headaches.  Endo/Heme/Allergies: Does not bruise/bleed easily.    Tolerating Diet:yes      DRUG ALLERGIES:   Allergies  Allergen Reactions  . Amlodipine Swelling  . Nexium [Esomeprazole Magnesium] Diarrhea    VITALS:  Blood pressure 140/60, pulse 92, temperature 99.3 F (37.4 C), temperature source Axillary, resp. rate 18, height _0  (1.626 m), weight 70.67 kg (155 lb 12.8 oz), SpO2 98 %.  PHYSICAL EXAMINATION:   Physical Exam  Constitutional: She is oriented to person, place, and time and well-developed, well-nourished, and in no distress. No distress.  HENT:  Head: Normocephalic.  Eyes: No scleral icterus.  Neck: Normal range of motion. Neck supple. No JVD present. No tracheal deviation present.  Cardiovascular: Normal rate, regular rhythm and normal heart sounds.  Exam reveals no gallop and no friction rub.   No murmur heard. Pulmonary/Chest: Effort normal and breath sounds normal. No respiratory distress. She has no wheezes. She has no rales. She exhibits no tenderness.  Abdominal: Soft. Bowel sounds are normal.  She exhibits no distension and no mass. There is no tenderness. There is no rebound and no guarding.  Musculoskeletal: Normal range of motion. She exhibits no edema.  Neurological: She is alert and oriented to person, place, and time.  Skin: Skin is warm. No rash noted. No erythema.  Psychiatric: Affect and judgment normal.      LABORATORY PANEL:   CBC  Recent Labs Lab 11/26/15 0435  WBC 8.3  HGB 11.0*  HCT 33.4*  PLT 305   ------------------------------------------------------------------------------------------------------------------  Chemistries   Recent Labs Lab 11/25/15 1937 11/26/15 0435  NA 133* 136  K 3.7 3.2*  CL 100* 105  CO2 24 24  GLUCOSE 201* 119*  BUN 15 11  CREATININE 1.20* 0.93  CALCIUM 8.7* 7.5*  AST 37  --   ALT 15  --   ALKPHOS 74  --   BILITOT 0.5  --    ------------------------------------------------------------------------------------------------------------------  Cardiac Enzymes No results for input(s): TROPONINI in the last 168 hours. ------------------------------------------------------------------------------------------------------------------  RADIOLOGY:  Ct Head Wo Contrast  11/25/2015  CLINICAL DATA:  Fall earlier today.  Found on floor. EXAM: CT HEAD WITHOUT CONTRAST TECHNIQUE: Contiguous axial images were obtained from the base of the skull through the vertex without intravenous contrast. COMPARISON:  None. FINDINGS: Mild age related volume loss. Mild chronic small vessel disease throughout the deep white matter. No acute intracranial abnormality. Specifically, no hemorrhage, hydrocephalus, mass lesion, acute infarction, or significant intracranial injury. No acute calvarial abnormality. Visualized paranasal sinuses and mastoids clear. Orbital soft tissues unremarkable. IMPRESSION: No acute intracranial abnormality. Atrophy, chronic microvascular disease. Electronically Signed   By: Rolm Baptise M.D.   On: 11/25/2015 21:40  Ct  Abdomen Pelvis W Contrast  11/25/2015  CLINICAL DATA:  Left lower quadrant pain, diarrhea. Generalized weakness. Fall today. History of diverticulitis. EXAM: CT ABDOMEN AND PELVIS WITH CONTRAST TECHNIQUE: Multidetector CT imaging of the abdomen and pelvis was performed using the standard protocol following bolus administration of intravenous contrast. CONTRAST:  74m OMNIPAQUE IOHEXOL 300 MG/ML  SOLN COMPARISON:  CT 10 days prior 11/15/2015, as well as priors FINDINGS: Lower chest: Reticulation at the lung bases consistent with fibrosis. Bronchiectasis is stable from recent prior. Moderate hiatal hernia. Liver: No focal lesion. Hepatobiliary: Clips in the gallbladder fossa postcholecystectomy. Biliary prominence is stable and likely sequela of prior cholecystectomy. Pancreas: No ductal dilatation or inflammation. Spleen: Normal. Adrenal glands: No nodule. Kidneys: No hydronephrosis. Heterogeneous enhancement on delayed phase imaging bilaterally. Stomach/Bowel: Stomach physiologically distended. There are no dilated or thickened small bowel loops. No bowel obstruction with oral contrast reaching to the transverse colon. There is persistent but improved inflammation in the midline lower pelvis with innumerable sigmoid colonic diverticula. Mild associated sigmoid colonic wall thickening. No abscess or extraluminal air. No evidence perforation. The appendix is surgically absent. A probable prominent appendiceal stump without inflammation. Vascular/Lymphatic: No retroperitoneal adenopathy. Abdominal aorta is normal in caliber. Atherosclerosis without aneurysm. Reproductive: Uterus surgically absent.  No adnexal mass. Bladder: Physiologically distended, no wall thickening or intravesicular air. Other: No free air, free fluid, or intra-abdominal fluid collection. Musculoskeletal: There are no acute or suspicious osseous abnormalities. Subchondral sclerosis about the right femoral head, may reflect AVN. There is an acute  minimally displaced fracture through the greater trochanter of the right hip. T9 compression deformity is only partially included, chronic based on chest CT 07/08/2015. IMPRESSION: 1. Persistent but improved fat stranding in the pelvis may reflect residual/persistent diverticulitis. No progression, abscess or perforation. 2. Heterogeneous enhancement of the kidneys again seen, recommend correlation with urinalysis to exclude urinary tract infection. 3. Acute right hip greater trochanter fracture. Electronically Signed   By: MJeb LeveringM.D.   On: 11/25/2015 21:55   Dg Chest Port 1 View  11/25/2015  CLINICAL DATA:  Bibasilar rhonchi EXAM: PORTABLE CHEST 1 VIEW COMPARISON:  07/08/2015 FINDINGS: Cardiac shadow is at the upper limits of normal in size. The lungs are well aerated bilaterally. Mild interstitial changes are seen. No focal confluent infiltrate is noted. No acute bony abnormality noted. IMPRESSION: Chronic interstitial changes without acute abnormality. Electronically Signed   By: MInez CatalinaM.D.   On: 11/25/2015 20:27   Dg Hip Unilat  With Pelvis 2-3 Views Right  11/25/2015  CLINICAL DATA:  Weakness.  Fall today. EXAM: DG HIP (WITH OR WITHOUT PELVIS) 2-3V RIGHT COMPARISON:  None. FINDINGS: There is a fracture through the tip of the right greater trochanter. No visible fracture across the femoral neck. Early degenerative changes in the hips bilaterally. SI joints are symmetric and unremarkable. IMPRESSION: Fracture through the tip of the right greater trochanter. No definite fracture seen across the femoral neck. Electronically Signed   By: KRolm BaptiseM.D.   On: 11/25/2015 20:12     ASSESSMENT AND PLAN:   75year old female with history of atrial fibrillation on anticoagulation, diabetes and IBS who presented after fall and right hip pain and subsequently found to be hypoxic.  1. Acute hypoxic respiratory failure: This is secondary to influenza a and acute bronchitis. Continue  Levaquin. Maj. drug interaction with Tamiflu and sotalol therefore patient will not be a candidate for Tamiflu. 2. Right great trochanter fracture: With the  consult.  3. Recent sigmoid diverticulitis: CT of abdomen showed no acute findings. Continue Levaquin and Flagyl.  4. Chronic diarrhea with IBS: Check C. Difficile.  5. Atrial fibrillation: Continue sotalol, L a close  6. Hypothyroid: Continue Synthroid.  7. Essential hypertension: Continue losartan and hydralazine  8. Diabetes: Continue sliding scale insulin, ADA diet and Amaryl.   Management plans discussed with the patient and she is in agreement.  CODE STATUS: FULL  TOTAL TIME TAKING CARE OF THIS PATIENT: 30 minutes.     POSSIBLE D/C 2-3 days, DEPENDING ON CLINICAL CONDITION.   Zakeria Kulzer M.D on 11/26/2015 at 10:09 AM  Between 7am to 6pm - Pager - [phone] After 6pm go to [email or web address] - password [email or web address] [facility]  Office  [phone]  CC: Primary care physician; Glendon Axe, MD  Note: This dictation was prepared with Dragon dictation along with smaller phrase technology. Any transcriptional errors that result from this process are unintentional.

## 2015-11-26 NOTE — Progress Notes (Signed)
Heart rate 201. Dr mody notified who ordered cardizem 59m iv stat, transfer pt to 2a and start a cardizem drip once on 2a

## 2015-11-26 NOTE — Progress Notes (Signed)
A&O. Admitted from home. On 2L O2. Fever of 101. Tylenol given. Skin verified with [facility]. Tele box verified with CCU by myself and Olivia Mackie. IV antibiotics given. Patient is positive for flu. THis was explained to her. INstructed patient to call before getting out of bed.

## 2015-11-26 NOTE — Progress Notes (Signed)
Pharmacy Antibiotic Note  Julie Mcmillan is a 75 y.o. female admitted on 11/25/2015 with pneumonia. Pharmacy has been consulted for Levaquin dosing.  Plan: Patient currently on Levaquin 750 mg IV q 48 hours due to CrCl <50 ml/min. Now that CrCl has improved, will adjust orders to levaquin 792m IV daily with a recommended duration of 5 days.  Height: _0  (162.6 cm) Weight: 155 lb 12.8 oz (70.67 kg) IBW/kg (Calculated) : 54.7  Temp (24hrs), Avg:99.8 F (37.7 C), Min:98.1 F (36.7 C), Max:101 F (38.3 C)   Recent Labs Lab 11/25/15 1937 11/26/15 0435  WBC 12.8* 8.3  CREATININE 1.20* 0.93    Estimated Creatinine Clearance: 50.4 mL/min (by C-G formula based on Cr of 0.93).    Allergies  Allergen Reactions  . Amlodipine Swelling  . Nexium [Esomeprazole Magnesium] Diarrhea    Antimicrobials this admission: Anti-infectives    Start     Dose/Rate Route Frequency Ordered Stop   11/28/15 1000  levofloxacin (LEVAQUIN) IVPB 750 mg     750 mg 100 mL/hr over 90 Minutes Intravenous Every 48 hours 11/26/15 0138     11/26/15 1015  oseltamivir (TAMIFLU) capsule 75 mg  Status:  Discontinued     75 mg Oral 2 times daily 11/26/15 1010 11/26/15 1040   11/26/15 1015  metroNIDAZOLE (FLAGYL) tablet 500 mg     500 mg Oral 2 times daily 11/26/15 1012     11/26/15 0145  levofloxacin (LEVAQUIN) IVPB 750 mg     750 mg 100 mL/hr over 90 Minutes Intravenous  Once 11/26/15 0138 11/26/15 0501   11/25/15 2215  piperacillin-tazobactam (ZOSYN) IVPB 3.375 g     3.375 g 100 mL/hr over 30 Minutes Intravenous  Once 11/25/15 2211 11/26/15 0005      Dose adjustments this admission: Increased levaquin from 7570mIV q48 to 7502mV daily.  Microbiology results: Results for orders placed or performed during the hospital encounter of 11/25/15  C difficile quick scan w PCR reflex     Status: None   Collection Time: 11/26/15 12:01 AM  Result Value Ref Range Status   C Diff antigen NEGATIVE NEGATIVE Final    C Diff toxin NEGATIVE NEGATIVE Final   C Diff interpretation Negative for C. difficile  Final  Rapid Influenza A&B Antigens ([facility])     Status: None   Collection Time: 11/26/15 12:02 AM  Result Value Ref Range Status   Influenza A ([facility]) DETECTED  Final   Influenza B ([facility]) NOT DETECTED  Final    CXR: no acute disease UA: LE(+) NO2(-) WBC 6-30  Thank you for allowing pharmacy to be a part of this patient's care.  [name]25/2017 11:25 AM

## 2015-11-26 NOTE — ED Notes (Signed)
Contacted her son to give him update on patient progress and tell him her new room number on the ortho floor, room 148.

## 2015-11-26 NOTE — Progress Notes (Signed)
Report called to Brandy,RN. Patient transfer to 2A by Supervisor and RRT.

## 2015-11-26 NOTE — Progress Notes (Signed)
PT Cancellation Note  Patient Details Name: Julie Mcmillan MRN: [id number] DOB: 29-Sep-1941   Cancelled Treatment:    Reason Eval/Treat Not Completed: Patient not medically ready  Attempted to see pt this afternoon, her HR was elevated and got up near 200.  Held PT today, will hold until pt is appropriate.  Wayne Both, PT, DPT [phone]  Kreg Shropshire 11/26/2015, 5:17 PM

## 2015-11-26 NOTE — Progress Notes (Signed)
Spoke with Dr. Nehemiah Massed because patient's HR increased back to 150's (ST). Patient had been ST 107 the last hour. Dr. Nehemiah Massed ordered for sotalol to be given order and notify if no change in 1-2 hours. Wilnette Kales

## 2015-11-26 NOTE — Progress Notes (Signed)
Notified Dr. Jannifer Franklin of BP 100/47. INstructed to hold betapace and hydralazine.

## 2015-11-26 NOTE — Progress Notes (Signed)
Spoke with Dr. Benjie Karvonen about patient's HR staying 180's with runs of vtach. Ordered for patient to be increased to 10 mg/hr, if HR not improved in 30 minutes okay to increase to 15 mg/hr. Cardiology to be consulted as well. Wilnette Kales

## 2015-11-26 NOTE — Progress Notes (Signed)
Pt's heart rate elevated to 200, RRT called.

## 2015-11-26 NOTE — Progress Notes (Signed)
Pharmacy Antibiotic Note  Julie Mcmillan is a 75 y.o. female admitted on 11/25/2015 with pneumonia.  Pharmacy has been consulted for Levaquin dosing.  Plan: Levaquin 750 mg IV q 48 hours ordered.  Height: _0  (162.6 cm) Weight: 145 lb (65.772 kg) IBW/kg (Calculated) : 54.7  Temp (24hrs), Avg:98.1 F (36.7 C), Min:98.1 F (36.7 C), Max:98.1 F (36.7 C)   Recent Labs Lab 11/25/15 1937  WBC 12.8*  CREATININE 1.20*    Estimated Creatinine Clearance: 37.8 mL/min (by C-G formula based on Cr of 1.2).    Allergies  Allergen Reactions  . Amlodipine Swelling  . Nexium [Esomeprazole Magnesium] Diarrhea    Antimicrobials this admission:   >>    >>   Dose adjustments this admission:   Microbiology results:  BCx:   UCx:    Sputum:    MRSA PCR:   CXR: no acute disease UA: LE(+) NO2(-) WBC 6-30  Thank you for allowing pharmacy to be a part of this patient's care.  Don Tiu S 11/26/2015 1:39 AM

## 2015-11-26 NOTE — Progress Notes (Signed)
Patient transferred from 1A for afib RVR. Verbal orders received from Dr. Benjie Karvonen to place orders for Cardizem drip. All other vss at this time. Patient is alert and oriented x4, telemetry box and skin verified with Maddie, RN. Wilnette Kales

## 2015-11-26 NOTE — Significant Event (Signed)
Rapid Response Event Note  Overview: Time Called: 5277 Arrival Time: 1556 Event Type: Cardiac  Initial Focused Assessment: afib rvr on tele monitor (170's) pt alert and orientedx 4   Interventions:Dr Mody ordered 20 mg cardizem push, and transfer pt to 2A for drip  Event Summary:   at      at          [place]

## 2015-11-26 NOTE — H&P (Addendum)
Julie Mcmillan NAME: Natalee Tomkiewicz    MR#:  [id number]  DATE OF BIRTH:  Oct 14, 1940  DATE OF ADMISSION:  11/25/2015  PRIMARY CARE PHYSICIAN: Glendon Axe, MD   REQUESTING/REFERRING PHYSICIAN: Dr. Joni Fears  CHIEF COMPLAINT:   Chief Complaint  Patient presents with  . Fall  . Weakness    HISTORY OF PRESENT ILLNESS:  Julie Mcmillan  is a 75 y.o. female with a known history of atrial fibrillation, hypertension, diabetes, irritable bowel syndrome with chronic diarrhea presents to the emergency room due to a fall and acute right hip pain. Patient was seen on 11/15/2015 in the emergency room and diagnosed with sigmoid diverticulitis and placed on ciprofloxacin and Flagyl for 1 week. She finished these medications. 3 days back she started having cough and sputum with wheezing and was placed on amoxicillin. Today she was found to be hypoxic at 87% on room air in the emergency room. She has had chronic on and off diarrhea and a CT scan abdomen was repeated in the emergency room of which showed improved but persistent changes of diverticulitis in the sigmoid area. X-ray showed greater trochanter fracture of the right hip. She has been given 1 dose of IV Zosyn in the emergency room. Patient is not sure if she blacked out during the fall but she thinks she could have passed out for a few seconds. She was laying on the floor for 3-4 hours due to pain and no help at home.  PAST MEDICAL HISTORY:   Past Medical History  Diagnosis Date  . A-fib ([place])   . Hypertension   . IBS (irritable bowel syndrome)   . Shortness of breath dyspnea   . GERD (gastroesophageal reflux disease)   . Interstitial lung disease ([place])   . Seasonal affective disorder ([place])   . Type 2 diabetes mellitus ([place]) 11/04/2014  . IBS (irritable bowel syndrome)   . Diverticulitis   . Chronic diarrhea     PAST SURGICAL HISTORY:   Past Surgical History  Procedure  Laterality Date  . Nissen fundoplication    . Abdominal hysterectomy      partial  . Appendectomy    . Tonsillectomy      and adenoidectomy  . Colonoscopy    . Cataract extraction w/phaco Right 03/16/2015    Procedure: CATARACT EXTRACTION PHACO AND INTRAOCULAR LENS PLACEMENT (IOC);  Surgeon: Leandrew Koyanagi, MD;  Location: [place];  Service: Ophthalmology;  Laterality: Right;    SOCIAL HISTORY:   Social History  Substance Use Topics  . Smoking status: Never Smoker   . Smokeless tobacco: Not on file  . Alcohol Use: No    FAMILY HISTORY:   Family History  Problem Relation Age of Onset  . Breast cancer Mother   . Diabetes Mellitus II Mother   . Hypothyroidism Mother   . Atrial fibrillation Mother   . Heart attack Father     DRUG ALLERGIES:   Allergies  Allergen Reactions  . Amlodipine Swelling  . Nexium [Esomeprazole Magnesium] Diarrhea    REVIEW OF SYSTEMS:   Review of Systems  Constitutional: Positive for malaise/fatigue. Negative for fever, chills and weight loss.  HENT: Negative for hearing loss and nosebleeds.   Eyes: Negative for blurred vision, double vision and pain.  Respiratory: Negative for cough, hemoptysis, sputum production, shortness of breath and wheezing.   Cardiovascular: Negative for chest pain, palpitations, orthopnea and leg swelling.  Gastrointestinal: Positive for nausea and diarrhea. Negative  for vomiting, abdominal pain and constipation.  Genitourinary: Negative for dysuria and hematuria.  Musculoskeletal: Positive for joint pain and falls. Negative for myalgias and back pain.  Skin: Negative for rash.  Neurological: Negative for dizziness, tremors, sensory change, speech change, focal weakness, seizures and headaches.  Endo/Heme/Allergies: Does not bruise/bleed easily.  Psychiatric/Behavioral: Negative for depression and memory loss. The patient is not nervous/anxious.     MEDICATIONS AT HOME:   Prior to Admission  medications   Medication Sig Start Date End Date Taking? Authorizing Provider  amoxicillin-clavulanate (AUGMENTIN) 875-125 MG tablet Take 1 tablet by mouth every 12 (twelve) hours. 07/10/15  Yes Marlyce Huge, MD  apixaban (ELIQUIS) 5 MG TABS tablet Take 5 mg by mouth 2 (two) times daily.   Yes Historical Provider, MD  Calcium Carbonate-Vitamin D (CALCIUM 600+D) 600-200 MG-UNIT TABS Take 1 tablet by mouth daily.   Yes Historical Provider, MD  dicyclomine (BENTYL) 20 MG tablet Take 1 tablet (20 mg total) by mouth 3 (three) times daily as needed for spasms. 11/10/15  Yes Daymon Larsen, MD  glimepiride (AMARYL) 2 MG tablet Take 2 mg by mouth daily.    Yes Historical Provider, MD  hydrALAZINE (APRESOLINE) 25 MG tablet Take 25 mg by mouth 3 (three) times daily.   Yes Historical Provider, MD  levothyroxine (SYNTHROID, LEVOTHROID) 50 MCG tablet Take 50 mcg by mouth daily before breakfast.   Yes Historical Provider, MD  losartan (COZAAR) 100 MG tablet Take 100 mg by mouth daily.    Yes Historical Provider, MD  metroNIDAZOLE (FLAGYL) 500 MG tablet Take 1 tablet (500 mg total) by mouth 2 (two) times daily. 11/15/15  Yes Loney Hering, MD  Multiple Vitamin (MULTIVITAMIN WITH MINERALS) TABS tablet Take 1 tablet by mouth daily.   Yes Historical Provider, MD  ondansetron (ZOFRAN ODT) 4 MG disintegrating tablet Take 1 tablet (4 mg total) by mouth every 8 (eight) hours as needed for nausea or vomiting. 11/15/15  Yes Loney Hering, MD  oxyCODONE-acetaminophen (ROXICET) 5-325 MG tablet Take 1 tablet by mouth every 6 (six) hours as needed. 11/15/15  Yes Loney Hering, MD  pantoprazole (PROTONIX) 40 MG tablet Take 1 tablet (40 mg total) by mouth daily. 03/29/15  Yes Glendon Axe, MD  sotalol (BETAPACE) 80 MG tablet Take 1 tablet (80 mg total) by mouth every 12 (twelve) hours. 03/29/15  Yes Glendon Axe, MD  venlafaxine XR (EFFEXOR-XR) 150 MG 24 hr capsule Take 1 capsule (150 mg total) by mouth daily with  lunch. 03/29/15  Yes Glendon Axe, MD  oxyCODONE-acetaminophen (ROXICET) 5-325 MG tablet Take 1 tablet by mouth every 6 (six) hours as needed for severe pain. Patient not taking: Reported on 11/25/2015 06/28/15   Carrie Mew, MD     VITAL SIGNS:  Blood pressure 144/83, pulse 93, temperature 98.1 F (36.7 C), temperature source Oral, resp. rate 19, height _0  (1.626 m), weight 65.772 kg (145 lb), SpO2 95 %.  PHYSICAL EXAMINATION:  Physical Exam  GENERAL:  75 y.o.-year-old patient lying in the bed with no acute distress.  EYES: Pupils equal, round, reactive to light and accommodation. No scleral icterus. Extraocular muscles intact.  HEENT: Head atraumatic, normocephalic. Oropharynx and nasopharynx clear. No oropharyngeal erythema, moist oral dry NECK:  Supple, no jugular venous distention. No thyroid enlargement, no tenderness.  LUNGS: Bilateral coarse breath sounds with expiratory wheezing. Normal work of breathing CARDIOVASCULAR: S1, S2 normal. No murmurs, rubs, or gallops.  ABDOMEN: Soft, nondistended. Bowel sounds present. No organomegaly  or mass. Right lower quadrant tenderness on deep palpation. EXTREMITIES: No pedal edema, cyanosis, or clubbing. + 2 pedal & radial pulses b/l.  Tenderness  right hip NEUROLOGIC: Cranial nerves II through XII are intact. No focal Motor or sensory deficits appreciated b/l PSYCHIATRIC: The patient is alert and oriented x 3. Good affect.  SKIN: No obvious rash, lesion, or ulcer.   LABORATORY PANEL:   CBC  Recent Labs Lab 11/25/15 1937  WBC 12.8*  HGB 13.8  HCT 42.9  PLT 374   ------------------------------------------------------------------------------------------------------------------  Chemistries   Recent Labs Lab 11/25/15 1937  NA 133*  K 3.7  CL 100*  CO2 24  GLUCOSE 201*  BUN 15  CREATININE 1.20*  CALCIUM 8.7*  AST 37  ALT 15  ALKPHOS 74  BILITOT 0.5    ------------------------------------------------------------------------------------------------------------------  Cardiac Enzymes No results for input(s): TROPONINI in the last 168 hours. ------------------------------------------------------------------------------------------------------------------  RADIOLOGY:  Ct Head Wo Contrast  11/25/2015  CLINICAL DATA:  Fall earlier today.  Found on floor. EXAM: CT HEAD WITHOUT CONTRAST TECHNIQUE: Contiguous axial images were obtained from the base of the skull through the vertex without intravenous contrast. COMPARISON:  None. FINDINGS: Mild age related volume loss. Mild chronic small vessel disease throughout the deep white matter. No acute intracranial abnormality. Specifically, no hemorrhage, hydrocephalus, mass lesion, acute infarction, or significant intracranial injury. No acute calvarial abnormality. Visualized paranasal sinuses and mastoids clear. Orbital soft tissues unremarkable. IMPRESSION: No acute intracranial abnormality. Atrophy, chronic microvascular disease. Electronically Signed   By: Rolm Baptise M.D.   On: 11/25/2015 21:40   Ct Abdomen Pelvis W Contrast  11/25/2015  CLINICAL DATA:  Left lower quadrant pain, diarrhea. Generalized weakness. Fall today. History of diverticulitis. EXAM: CT ABDOMEN AND PELVIS WITH CONTRAST TECHNIQUE: Multidetector CT imaging of the abdomen and pelvis was performed using the standard protocol following bolus administration of intravenous contrast. 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 Bronchiectasis is stable from recent prior. Moderate hiatal hernia. Liver: No focal lesion. Hepatobiliary: Clips in the gallbladder fossa postcholecystectomy. Biliary prominence is stable and likely sequela of prior cholecystectomy. Pancreas: No ductal dilatation or inflammation.  Spleen: Normal. Adrenal glands: No nodule. Kidneys: No hydronephrosis. Heterogeneous enhancement on delayed phase imaging bilaterally. Stomach/Bowel: Stomach physiologically distended. There are no dilated or thickened small bowel loops. No bowel obstruction with oral contrast reaching to the transverse colon. There is persistent but improved inflammation in the midline lower pelvis with innumerable sigmoid colonic diverticula. Mild associated sigmoid colonic wall thickening. No abscess or extraluminal air. No evidence perforation. The appendix is surgically absent. A probable prominent appendiceal stump without inflammation. Vascular/Lymphatic: No retroperitoneal adenopathy. Abdominal aorta is normal in caliber. Atherosclerosis without aneurysm. Reproductive: Uterus surgically absent.  No adnexal mass. Bladder: Physiologically distended, no wall thickening or intravesicular air. Other: No free air, free fluid, or intra-abdominal fluid collection. Musculoskeletal: There are no acute or suspicious osseous abnormalities. Subchondral sclerosis about the right femoral head, may reflect AVN. There is an acute minimally displaced fracture through the greater trochanter of the right hip. T9 compression deformity is only partially included, chronic based on chest CT 07/08/2015. IMPRESSION: 1. Persistent but improved fat stranding in the pelvis may reflect residual/persistent diverticulitis. No progression, abscess or perforation. 2. Heterogeneous enhancement of the kidneys again seen, recommend correlation with urinalysis to exclude urinary tract infection. 3. Acute right hip greater  trochanter fracture. Electronically Signed   By: Jeb Levering M.D.   On: 11/25/2015 21:55   Dg Chest Port 1 View  11/25/2015  CLINICAL DATA:  Bibasilar rhonchi EXAM: PORTABLE CHEST 1 VIEW COMPARISON:  07/08/2015 FINDINGS: Cardiac shadow is at the upper limits of normal in size. The lungs are well aerated bilaterally. Mild interstitial  changes are seen. No focal confluent infiltrate is noted. No acute bony abnormality noted. IMPRESSION: Chronic interstitial changes without acute abnormality. Electronically Signed   By: Inez Catalina M.D.   On: 11/25/2015 20:27   Dg Hip Unilat  With Pelvis 2-3 Views Right  11/25/2015  CLINICAL DATA:  Weakness.  Fall today. EXAM: DG HIP (WITH OR WITHOUT PELVIS) 2-3V RIGHT COMPARISON:  None. FINDINGS: There is a fracture through the tip of the right greater trochanter. No visible fracture across the femoral neck. Early degenerative changes in the hips bilaterally. SI joints are symmetric and unremarkable. IMPRESSION: Fracture through the tip of the right greater trochanter. No definite fracture seen across the femoral neck. Electronically Signed   By: Rolm Baptise M.D.   On: 11/25/2015 20:12     IMPRESSION AND PLAN:   * Acute bronchitis with acute hypoxic respiratory failure -IV steroids, Antibiotics - Scheduled Nebulizers -Wean O2 as tolerated - Consult pulmonary if no improvement  * Recent sigmoid diverticulitis Patient has some persistent changes of diverticulitis. No abdominal pain. Has tenderness in the right lower quadrant area which is away from her CT finding area. Patient is on Levaquin for acute bronchitis.  * Right greater trochanter fracture Consult orthopedics.  * Syncope likely from dehydration. We will monitor on telemetry. Check orthostatic vitals.  * Chronic diarrhea C. difficile ordered from emergency room and will await results. Has been on antibiotics recently.  * Diabetes mellitus Sliding scale insulin with diabetic diet  * Hypertension Continue home medications  * Atrial fibrillation Continue sotalol and Eliquis  DVT prophylaxis. Patient is on Eliquis.  All the records are reviewed and case discussed with ED provider. Management plans discussed with the patient, family and they are in agreement.  CODE STATUS: FULL  TOTAL TIME TAKING CARE OF THIS  PATIENT: 45 minutes.   Hillary Bow R M.D on 11/26/2015 at 12:09 AM  Between 7am to 6pm - Pager - [phone]  After 6pm go to [email or web address] - password [email or web address] [facility]  Office  [phone]  CC: Primary care physician; Glendon Axe, MD  Note: This dictation was prepared with Dragon dictation along with smaller phrase technology. Any transcriptional errors that result from this process are unintentional.

## 2015-11-26 NOTE — Progress Notes (Signed)
Spoke with Dr.Mody regarding pt's potassium level of 3.2 and elevated heart rate of 121. Order received to resume pt's home med of sotalol 80 mg po bid and potassium of 40 meq oral x 1.

## 2015-11-26 NOTE — Progress Notes (Signed)
Responded to Rapid Response room 148 at 1558. Durene Fruits, RN ICU- [place]/RR Nurse already at bedside. Telemetry monitor showed Afib with RVR HR 170s. Patient alert and oriented, in no acute distress. Denies chest pain or shortness of breath. Skin warm and dry. Cardizem IV push ordered by Dr. Benjie Karvonen. Dose given by  Joellen Jersey. Transported to 2A per M.D. order for cardizem drip.

## 2015-11-26 NOTE — Progress Notes (Signed)
Patient being transfer to 2A- Room 245.

## 2015-11-26 NOTE — ED Notes (Signed)
Retook nasal specimen for Quick Flu PCR

## 2015-11-27 ENCOUNTER — Other Ambulatory Visit: Payer: Self-pay

## 2015-11-27 ENCOUNTER — Inpatient Hospital Stay: Payer: Medicare Other

## 2015-11-27 LAB — CBC
HEMATOCRIT: 36.3 % (ref 35.0–47.0)
Hemoglobin: 12.1 g/dL (ref 12.0–16.0)
MCH: 26.6 pg (ref 26.0–34.0)
MCHC: 33.5 g/dL (ref 32.0–36.0)
MCV: 79.5 fL — AB (ref 80.0–100.0)
PLATELETS: 310 10*3/uL (ref 150–440)
RBC: 4.56 MIL/uL (ref 3.80–5.20)
RDW: 17.6 % — AB (ref 11.5–14.5)
WBC: 9.2 10*3/uL (ref 3.6–11.0)

## 2015-11-27 LAB — BASIC METABOLIC PANEL
Anion gap: 7 (ref 5–15)
BUN: 7 mg/dL (ref 6–20)
CALCIUM: 8 mg/dL — AB (ref 8.9–10.3)
CO2: 24 mmol/L (ref 22–32)
CREATININE: 0.58 mg/dL (ref 0.44–1.00)
Chloride: 102 mmol/L (ref 101–111)
GLUCOSE: 62 mg/dL — AB (ref 65–99)
Potassium: 3.7 mmol/L (ref 3.5–5.1)
Sodium: 133 mmol/L — ABNORMAL LOW (ref 135–145)

## 2015-11-27 LAB — GLUCOSE, CAPILLARY
GLUCOSE-CAPILLARY: 104 mg/dL — AB (ref 65–99)
GLUCOSE-CAPILLARY: 154 mg/dL — AB (ref 65–99)
Glucose-Capillary: 114 mg/dL — ABNORMAL HIGH (ref 65–99)
Glucose-Capillary: 61 mg/dL — ABNORMAL LOW (ref 65–99)
Glucose-Capillary: 99 mg/dL (ref 65–99)
Glucose-Capillary: 143 mg/dL — ABNORMAL HIGH (ref 65–99)

## 2015-11-27 MED ORDER — DILTIAZEM HCL ER COATED BEADS 120 MG PO CP24
120.0000 mg | ORAL_CAPSULE | Freq: Every day | ORAL | Status: DC
  Administered 2015-11-27 – 2015-11-29 (×3): 120 mg via ORAL
  Filled 2015-11-27 (×3): qty 1

## 2015-11-27 MED ORDER — DILTIAZEM HCL 25 MG/5ML IV SOLN
10.0000 mg | Freq: Once | INTRAVENOUS | Status: DC
Start: 2015-11-27 — End: 2015-11-27

## 2015-11-27 MED ORDER — PREDNISONE 20 MG PO TABS
40.0000 mg | ORAL_TABLET | Freq: Every day | ORAL | Status: DC
  Administered 2015-11-28 – 2015-11-29 (×2): 40 mg via ORAL
  Filled 2015-11-27 (×2): qty 2

## 2015-11-27 NOTE — Progress Notes (Signed)
Hydralazine and Losartan held this am d/t BP and patient still on Cardizem drip. Dr. Benjie Karvonen notified.  Wilnette Kales

## 2015-11-27 NOTE — Progress Notes (Signed)
Per Dr. Benjie Karvonen this morning titrate Cardizem drip down and stop. Patient has been titrated since this am, drip is now stopped. Patient is currently NSR 71, vss.  [address]

## 2015-11-27 NOTE — Progress Notes (Signed)
[facility] NAME: Julie Mcmillan    MR#:  [id number]  DATE OF BIRTH:  14-Dec-1940  SUBJECTIVE:   Patient was in A. fib RVR yesterday. Heart rate jumped up to 200s. She was given IV diltiazem and started on diltiazem drip. Heart rates have improved. Hip pain is controlled.   REVIEW OF SYSTEMS:    Review of Systems  Constitutional: Negative for fever, chills and malaise/fatigue.  HENT: Negative for sore throat.   Eyes: Negative for blurred vision.  Respiratory: Negative for cough, hemoptysis, sputum production, shortness of breath and wheezing.   Cardiovascular: Negative for chest pain, palpitations and leg swelling.  Gastrointestinal: Negative for nausea, vomiting, abdominal pain, diarrhea and blood in stool.  Genitourinary: Negative for dysuria.  Musculoskeletal: Positive for falls. Negative for back pain and joint pain.  Neurological: Negative for dizziness, tremors and headaches.  Endo/Heme/Allergies: Does not bruise/bleed easily.    Tolerating Diet:yes      DRUG ALLERGIES:   Allergies  Allergen Reactions  . Amlodipine Swelling  . Nexium [Esomeprazole Magnesium] Diarrhea    VITALS:  Blood pressure 107/50, pulse 70, temperature 98.1 F (36.7 C), temperature source Oral, resp. rate 18, height _0  (1.626 m), weight 70.58 kg (155 lb 9.6 oz), SpO2 97 %.  PHYSICAL EXAMINATION:   Physical Exam  Constitutional: She is oriented to person, place, and time and well-developed, well-nourished, and in no distress. No distress.  HENT:  Head: Normocephalic.  Eyes: No scleral icterus.  Neck: Normal range of motion. Neck supple. No JVD present. No tracheal deviation present.  Cardiovascular: Normal rate, regular rhythm and normal heart sounds.  Exam reveals no gallop and no friction rub.   No murmur heard. Pulmonary/Chest: Effort normal. No respiratory distress. She has wheezes. She has rales. She exhibits no  tenderness.  Abdominal: Soft. Bowel sounds are normal. She exhibits no distension and no mass. There is no tenderness. There is no rebound and no guarding.  Musculoskeletal: Normal range of motion. She exhibits no edema.  Neurological: She is alert and oriented to person, place, and time.  Skin: Skin is warm. No rash noted. No erythema.  Psychiatric: Affect and judgment normal.      LABORATORY PANEL:   CBC  Recent Labs Lab 11/27/15 0600  WBC 9.2  HGB 12.1  HCT 36.3  PLT 310   ------------------------------------------------------------------------------------------------------------------  Chemistries   Recent Labs Lab 11/25/15 1937  11/27/15 0600  NA 133*  < > 133*  K 3.7  < > 3.7  CL 100*  < > 102  CO2 24  < > 24  GLUCOSE 201*  < > 62*  BUN 15  < > 7  CREATININE 1.20*  < > 0.58  CALCIUM 8.7*  < > 8.0*  AST 37  --   --   ALT 15  --   --   ALKPHOS 74  --   --   BILITOT 0.5  --   --   < > = values in this interval not displayed. ------------------------------------------------------------------------------------------------------------------  Cardiac Enzymes No results for input(s): TROPONINI in the last 168 hours. ------------------------------------------------------------------------------------------------------------------  RADIOLOGY:  Ct Head Wo Contrast  11/25/2015  CLINICAL DATA:  Fall earlier today.  Found on floor. EXAM: CT HEAD WITHOUT CONTRAST TECHNIQUE: Contiguous axial images were obtained from the base of the skull through the vertex without intravenous contrast. COMPARISON:  None. FINDINGS: Mild age related volume loss. Mild chronic small vessel disease throughout the  deep white matter. No acute intracranial abnormality. Specifically, no hemorrhage, hydrocephalus, mass lesion, acute infarction, or significant intracranial injury. No acute calvarial abnormality. Visualized paranasal sinuses and mastoids clear. Orbital soft tissues unremarkable.  IMPRESSION: No acute intracranial abnormality. Atrophy, chronic microvascular disease. Electronically Signed   By: Rolm Baptise M.D.   On: 11/25/2015 21:40   Ct Abdomen Pelvis W Contrast  11/25/2015  CLINICAL DATA:  Left lower quadrant pain, diarrhea. Generalized weakness. Fall today. History of diverticulitis. EXAM: CT ABDOMEN AND PELVIS WITH CONTRAST TECHNIQUE: Multidetector CT imaging of the abdomen and pelvis was performed using the standard protocol following bolus administration of intravenous contrast. 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 Bronchiectasis is stable from recent prior. Moderate hiatal hernia. Liver: No focal lesion. Hepatobiliary: Clips in the gallbladder fossa postcholecystectomy. Biliary prominence is stable and likely sequela of prior cholecystectomy. Pancreas: No ductal dilatation or inflammation. Spleen: Normal. Adrenal glands: No nodule. Kidneys: No hydronephrosis. Heterogeneous enhancement on delayed phase imaging bilaterally. Stomach/Bowel: Stomach physiologically distended. There are no dilated or thickened small bowel loops. No bowel obstruction with oral contrast reaching to the transverse colon. There is persistent but improved inflammation in the midline lower pelvis with innumerable sigmoid colonic diverticula. Mild associated sigmoid colonic wall thickening. No abscess or extraluminal air. No evidence perforation. The appendix is surgically absent. A probable prominent appendiceal stump without inflammation. Vascular/Lymphatic: No retroperitoneal adenopathy. Abdominal aorta is normal in caliber. Atherosclerosis without aneurysm. Reproductive: Uterus surgically absent.  No adnexal mass. Bladder: Physiologically distended, no wall thickening or intravesicular air. Other: No free air, free fluid, or intra-abdominal fluid  collection. Musculoskeletal: There are no acute or suspicious osseous abnormalities. Subchondral sclerosis about the right femoral head, may reflect AVN. There is an acute minimally displaced fracture through the greater trochanter of the right hip. T9 compression deformity is only partially included, chronic based on chest CT 07/08/2015. IMPRESSION: 1. Persistent but improved fat stranding in the pelvis may reflect residual/persistent diverticulitis. No progression, abscess or perforation. 2. Heterogeneous enhancement of the kidneys again seen, recommend correlation with urinalysis to exclude urinary tract infection. 3. Acute right hip greater trochanter fracture. Electronically Signed   By: MJeb LeveringM.D.   On: 11/25/2015 21:55   Dg Chest Port 1 View  11/25/2015  CLINICAL DATA:  Bibasilar rhonchi EXAM: PORTABLE CHEST 1 VIEW COMPARISON:  07/08/2015 FINDINGS: Cardiac shadow is at the upper limits of normal in size. The lungs are well aerated bilaterally. Mild interstitial changes are seen. No focal confluent infiltrate is noted. No acute bony abnormality noted. IMPRESSION: Chronic interstitial changes without acute abnormality. Electronically Signed   By: MInez CatalinaM.D.   On: 11/25/2015 20:27   Dg Hip Unilat  With Pelvis 2-3 Views Right  11/25/2015  CLINICAL DATA:  Weakness.  Fall today. EXAM: DG HIP (WITH OR WITHOUT PELVIS) 2-3V RIGHT COMPARISON:  None. FINDINGS: There is a fracture through the tip of the right greater trochanter. No visible fracture across the femoral neck. Early degenerative changes in the hips bilaterally. SI joints are symmetric and unremarkable. IMPRESSION: Fracture through the tip of the right greater trochanter. No definite fracture seen across the femoral neck. Electronically Signed   By: KRolm BaptiseM.D.   On: 11/25/2015 20:12     ASSESSMENT AND PLAN:   75year old female with history of atrial fibrillation on anticoagulation,  diabetes and IBS who presented after  fall and right hip pain and subsequently found to be hypoxic.  1. Acute hypoxic respiratory failure: This is secondary to influenza a and acute bronchitis. Continue Levaquin and wean PO steroids. Major drug interaction with Tamiflu and sotalol therefore patient will not be a candidate for Tamiflu. We'll order chest x-ray today to evaluate for pulmonary edema. Patient's heart rate was elevated up to 200 yesterday and she has rales/wheezing on examination today.   2. Minimally displaced Right great trochanter fracture:  Physical therapy consult today  avoid resisted abduction exercises for about 6 weeks, partial weightbearing for about 3 weeks we'll.   follow up with [facility] in 3 weeks for an x-ray and make sure there is been no displacement and expect this to heal without operative intervention as per order consult  Tinea supportive management  3. Recent sigmoid diverticulitis: CT of abdomen showed no acute findings. Continue Levaquin and Flagyl.  4. Chronic diarrhea with IBS: C. difficile negative   5. Atrial fibrillation: Wean diltiazem drip. Continue sotalol and ELIQUIS   6. Hypothyroid: Continue Synthroid.  7. Essential hypertension: Continue losartan and hydralazine  8. Diabetes: Continue sliding scale insulin, ADA diet and Amaryl.   Management plans discussed with the patient and she is in agreement.  CODE STATUS: FULL  TOTAL TIME TAKING CARE OF THIS PATIENT: 28 minutes.     POSSIBLE D/C monday, DEPENDING ON CLINICAL CONDITION.   Keyvin Rison M.D on 11/27/2015 at 10:06 AM  Between 7am to 6pm - Pager - [phone] After 6pm go to [email or web address] - password [email or web address] [facility]  Office  [phone]  CC: Primary care physician; Glendon Axe, MD  Note: This dictation was prepared with Dragon dictation along with smaller phrase technology. Any transcriptional errors that result from this process are unintentional.

## 2015-11-27 NOTE — Progress Notes (Signed)
Before given Cardizem IV push Dr. Nehemiah Massed notified of patient rhythm change and ordered to hold off on Cardizem. Order placed for PO 24 hr Cardizem. At this time, patient has converted back to NSR. Dr. Nehemiah Massed aware. Julie Mcmillan

## 2015-11-27 NOTE — Evaluation (Signed)
Physical Therapy Evaluation Patient Details Name: Julie Mcmillan MRN: [id number] DOB: October 30, 1940 Today's Date: 11/27/2015   History of Present Illness  Pt had a fall at home and suffered an avulsion fracture of the R greater trochanter.  She is supposed to avoid AROM hip ABd and is PWBing.   Clinical Impression  Pt shows good effort t/o PT exam despite having and and being somewhat fearful of activity. She is able to take a few very small, labored steps near the EOB but is not safe/ready to do much real walking.  Pt able to shift hips and use L LE to assist with R hip ABd, but overall pt needs assist with all acts secondary to pain, weakness, and limitations.     Follow Up Recommendations SNF    Equipment Recommendations  Rolling walker with 5" wheels    Recommendations for Other Services       Precautions / Restrictions Precautions Precautions: Fall Restrictions Weight Bearing Restrictions: Yes RLE Weight Bearing: Partial weight bearing      Mobility  Bed Mobility Overal bed mobility: Needs Assistance Bed Mobility: Supine to Sit;Sit to Supine     Supine to sit: Mod assist Sit to supine: Mod assist   General bed mobility comments: Pt is able to use L LE to assist R toward EOB, but she needs direct assist to get sitting at EOB  Transfers Overall transfer level: Needs assistance Equipment used: Rolling walker (2 wheeled) Transfers: Sit to/from Stand Sit to Stand: Mod assist         General transfer comment: Pt needs a lot of assist to initiate getting to standin and has some discomfort with WBing but ultimately is able to maintain balance with only CGA once up  Ambulation/Gait Ambulation/Gait assistance: Mod assist Ambulation Distance (Feet): 3 Feet Assistive device: Rolling walker (2 wheeled)       General Gait Details: Pt is able to take a a few small, shuffling, hesitant side steps along EOB and 2 small forward/backward steps.  Pt struggle with taking enough  weight through her UEs to unweight the R to make stepping with L possible.  Stairs            Wheelchair Mobility    Modified Rankin (Stroke Patients Only)       Balance                                             Pertinent Vitals/Pain Pain Assessment: 0-10 Pain Score: 5     Home Living Family/patient expects to be discharged to:: Private residence Living Arrangements: Alone   Type of Home: Apartment Home Access: Level entry         Additional Comments: grab bars, pull cords throughout apartment    Prior Function Level of Independence: Independent         Comments: Pt was driving, doing all necessary activities, states that she was generally pretty active.     Hand Dominance        Extremity/Trunk Assessment   Upper Extremity Assessment: Overall WFL for tasks assessed           Lower Extremity Assessment: LLE deficits/detail (ABd not tested, struggles with SLR, otherwise 3+/5 t/o)         Communication   Communication: No difficulties  Cognition Arousal/Alertness: Awake/alert Behavior During Therapy: WFL for tasks assessed/performed Overall Cognitive Status:  Within Functional Limits for tasks assessed                      General Comments      Exercises        Assessment/Plan    PT Assessment Patient needs continued PT services  PT Diagnosis Difficulty walking;Generalized weakness;Acute pain   PT Problem List Decreased strength;Decreased range of motion;Decreased activity tolerance;Decreased balance;Decreased mobility;Decreased coordination;Decreased knowledge of use of DME;Decreased safety awareness  PT Treatment Interventions DME instruction;Gait training;Functional mobility training;Therapeutic activities;Therapeutic exercise;Balance training;Neuromuscular re-education;Patient/family education   PT Goals (Current goals can be found in the Care Plan section) Acute Rehab PT Goals Patient Stated Goal: "I  would rather go home, but I agree rehab would be better" PT Goal Formulation: With patient Time For Goal Achievement: 12/11/15 Potential to Achieve Goals: Fair    Frequency 7X/week   Barriers to discharge        Co-evaluation               End of Session Equipment Utilized During Treatment: Gait belt Activity Tolerance: Patient limited by pain Patient left: with bed alarm set;with call bell/phone within reach           Time: 1202-1228 PT Time Calculation (min) (ACUTE ONLY): 26 min   Charges:   PT Evaluation $PT Eval Low Complexity: 1 Procedure     PT G Codes:       Wayne Both, PT, DPT [phone]  Kreg Shropshire 11/27/2015, 2:49 PM

## 2015-11-27 NOTE — Progress Notes (Signed)
A&O. On 2L O2. On diltiazem drip at 15. HR stable, SR in the 90's. BP has been staying in the low 846'K systolically. No complains.

## 2015-11-27 NOTE — Progress Notes (Signed)
Patient rhythm converted back to afib 130's. Dr. Benjie Karvonen notified and ordered for one time dose of 10 mg Cardizem IV to be given. Wilnette Kales

## 2015-11-27 NOTE — Consult Note (Signed)
[facility] Consultation Note  Patient ID: Julie Mcmillan, MRN: [id number], DOB/AGE: August 24, 1941 75 y.o. Admit date: 11/25/2015   Date of Consult: 11/27/2015 Primary Physician: Glendon Axe, MD Primary Cardiologist: Bluford Kaufmann  Chief Complaint:  Chief Complaint  Patient presents with  . Fall  . Weakness   Reason for Consult: paroxysmal nonvalvular atrial fibrillation with rapid ventricular rate  HPI: 75 y.o. female with known paroxysmal nonvalvular atrial fibrillation essential hypertension diabetes chronic kidney disease stage III lung disease as well as a recent diverticulitis and right hip fracture after a fall. After patient has been recovering from these issues the patient has had new onset of atrial fibrillation with rapid ventricular rate with shortness of breath weakness and fatigue but no evidence of myocardial infarction chest pain or heart failure type symptoms. The patient has had cardioversion back to normal rhythm after patient received appropriate Betapace and/or sotalol and some injection of diltiazem. The patient is still having some difficulty with intermittent atrial fibrillation and therefore will need slightly higher dosages of heart rate controlling medication management. Blood pressure is been very well controlled on angiotensin receptor blocker and hydralazine and the patient is on anticoagulation for further risk reduction in stroke with atrial fibrillation. The patient is back in normal rhythm at this time and feeling well with no evidence of other cardiac symptoms  Past Medical History  Diagnosis Date  . A-fib ([place])   . Hypertension   . IBS (irritable bowel syndrome)   . Shortness of breath dyspnea   . GERD (gastroesophageal reflux disease)   . Interstitial lung disease ([place])   . Seasonal affective disorder ([place])   . Type 2 diabetes mellitus ([place]) 11/04/2014  . IBS (irritable bowel syndrome)   . Diverticulitis   . Chronic diarrhea       Surgical  History:  Past Surgical History  Procedure Laterality Date  . Nissen fundoplication    . Abdominal hysterectomy      partial  . Appendectomy    . Tonsillectomy      and adenoidectomy  . Colonoscopy    . Cataract extraction w/phaco Right 03/16/2015    Procedure: CATARACT EXTRACTION PHACO AND INTRAOCULAR LENS PLACEMENT (IOC);  Surgeon: Leandrew Koyanagi, MD;  Location: [place];  Service: Ophthalmology;  Laterality: Right;     Home Meds: Prior to Admission medications   Medication Sig Start Date End Date Taking? Authorizing Provider  amoxicillin-clavulanate (AUGMENTIN) 875-125 MG tablet Take 1 tablet by mouth every 12 (twelve) hours. 07/10/15  Yes Marlyce Huge, MD  apixaban (ELIQUIS) 5 MG TABS tablet Take 5 mg by mouth 2 (two) times daily.   Yes Historical Provider, MD  Calcium Carbonate-Vitamin D (CALCIUM 600+D) 600-200 MG-UNIT TABS Take 1 tablet by mouth daily.   Yes Historical Provider, MD  dicyclomine (BENTYL) 20 MG tablet Take 1 tablet (20 mg total) by mouth 3 (three) times daily as needed for spasms. 11/10/15  Yes Daymon Larsen, MD  glimepiride (AMARYL) 2 MG tablet Take 2 mg by mouth daily.    Yes Historical Provider, MD  hydrALAZINE (APRESOLINE) 25 MG tablet Take 25 mg by mouth 3 (three) times daily.   Yes Historical Provider, MD  levothyroxine (SYNTHROID, LEVOTHROID) 50 MCG tablet Take 50 mcg by mouth daily before breakfast.   Yes Historical Provider, MD  losartan (COZAAR) 100 MG tablet Take 100 mg by mouth daily.    Yes Historical Provider, MD  metroNIDAZOLE (FLAGYL) 500 MG tablet Take 1 tablet (500 mg total)  by mouth 2 (two) times daily. 11/15/15  Yes Loney Hering, MD  Multiple Vitamin (MULTIVITAMIN WITH MINERALS) TABS tablet Take 1 tablet by mouth daily.   Yes Historical Provider, MD  ondansetron (ZOFRAN ODT) 4 MG disintegrating tablet Take 1 tablet (4 mg total) by mouth every 8 (eight) hours as needed for nausea or vomiting. 11/15/15  Yes Loney Hering,  MD  oxyCODONE-acetaminophen (ROXICET) 5-325 MG tablet Take 1 tablet by mouth every 6 (six) hours as needed. 11/15/15  Yes Loney Hering, MD  pantoprazole (PROTONIX) 40 MG tablet Take 1 tablet (40 mg total) by mouth daily. 03/29/15  Yes Glendon Axe, MD  sotalol (BETAPACE) 80 MG tablet Take 1 tablet (80 mg total) by mouth every 12 (twelve) hours. 03/29/15  Yes Glendon Axe, MD  venlafaxine XR (EFFEXOR-XR) 150 MG 24 hr capsule Take 1 capsule (150 mg total) by mouth daily with lunch. 03/29/15  Yes Glendon Axe, MD  oxyCODONE-acetaminophen (ROXICET) 5-325 MG tablet Take 1 tablet by mouth every 6 (six) hours as needed for severe pain. Patient not taking: Reported on 11/25/2015 06/28/15   Carrie Mew, MD    Inpatient Medications:  . apixaban  5 mg Oral BID  . diltiazem  120 mg Oral Daily  . glimepiride  2 mg Oral Daily  . hydrALAZINE  25 mg Oral TID  . insulin aspart  0-20 Units Subcutaneous TID WC  . insulin aspart  0-5 Units Subcutaneous QHS  . levofloxacin (LEVAQUIN) IV  750 mg Intravenous Q24H  . levothyroxine  50 mcg Oral QAC breakfast  . losartan  100 mg Oral Daily  . metroNIDAZOLE  500 mg Oral BID  . pantoprazole  40 mg Oral Daily  . [START ON 11/28/2015] predniSONE  40 mg Oral Q breakfast  . sodium chloride flush  3 mL Intravenous Q12H  . sotalol  80 mg Oral Q12H  . venlafaxine XR  150 mg Oral Q lunch   . diltiazem (CARDIZEM) infusion Stopped (11/27/15 1459)    Allergies:  Allergies  Allergen Reactions  . Amlodipine Swelling  . Nexium [Esomeprazole Magnesium] Diarrhea    Social History   Social History  . Marital Status: Divorced    Spouse Name: N/A  . Number of Children: N/A  . Years of Education: N/A   Occupational History  . Not on file.   Social History Main Topics  . Smoking status: Never Smoker   . Smokeless tobacco: Not on file  . Alcohol Use: No  . Drug Use: Not on file  . Sexual Activity: Not on file   Other Topics Concern  . Not on file    Social History Narrative     Family History  Problem Relation Age of Onset  . Breast cancer Mother   . Diabetes Mellitus II Mother   . Hypothyroidism Mother   . Atrial fibrillation Mother   . Heart attack Father      Review of Systems Positive for shortness of breath palpitations Negative for: General:  chills, fever, night sweats or weight changes.  Cardiovascular: PND orthopnea syncope dizziness  Dermatological skin lesions rashes Respiratory: Cough congestion Urologic: Frequent urination urination at night and hematuria Abdominal: negative for nausea, vomiting, diarrhea, bright red blood per rectum, melena, or hematemesis Neurologic: negative for visual changes, and/or hearing changes  All other systems reviewed and are otherwise negative except as noted above.  Labs:  Recent Labs  11/25/15 1937  CKTOTAL 68   Lab Results  Component Value Date  WBC 9.2 11/27/2015   HGB 12.1 11/27/2015   HCT 36.3 11/27/2015   MCV 79.5* 11/27/2015   PLT 310 11/27/2015    Recent Labs Lab 11/25/15 1937  11/27/15 0600  NA 133*  < > 133*  K 3.7  < > 3.7  CL 100*  < > 102  CO2 24  < > 24  BUN 15  < > 7  CREATININE 1.20*  < > 0.58  CALCIUM 8.7*  < > 8.0*  PROT 6.8  --   --   BILITOT 0.5  --   --   ALKPHOS 74  --   --   ALT 15  --   --   AST 37  --   --   GLUCOSE 201*  < > 62*  < > = values in this interval not displayed. No results found for: CHOL, HDL, LDLCALC, TRIG No results found for: DDIMER  Radiology/Studies:  Dg Chest 1 View  11/27/2015  CLINICAL DATA:  Hypertension, diabetes EXAM: CHEST 1 VIEW COMPARISON:  11/25/2015 FINDINGS: Peribronchial thickening and interstitial prominence, similar to prior study. Heart is borderline in size. No confluent opacities or effusions. No acute bony abnormality. IMPRESSION: Chronic interstitial lung changes. Electronically Signed   By: Rolm Baptise M.D.   On: 11/27/2015 10:45   Ct Head Wo Contrast  11/25/2015  CLINICAL DATA:   Fall earlier today.  Found on floor. EXAM: CT HEAD WITHOUT CONTRAST TECHNIQUE: Contiguous axial images were obtained from the base of the skull through the vertex without intravenous contrast. COMPARISON:  None. FINDINGS: Mild age related volume loss. Mild chronic small vessel disease throughout the deep white matter. No acute intracranial abnormality. Specifically, no hemorrhage, hydrocephalus, mass lesion, acute infarction, or significant intracranial injury. No acute calvarial abnormality. Visualized paranasal sinuses and mastoids clear. Orbital soft tissues unremarkable. IMPRESSION: No acute intracranial abnormality. Atrophy, chronic microvascular disease. Electronically Signed   By: Rolm Baptise M.D.   On: 11/25/2015 21:40   Ct Abdomen Pelvis W Contrast  11/25/2015  CLINICAL DATA:  Left lower quadrant pain, diarrhea. Generalized weakness. Fall today. History of diverticulitis. EXAM: CT ABDOMEN AND PELVIS WITH CONTRAST TECHNIQUE: Multidetector CT imaging of the abdomen and pelvis was performed using the standard protocol following bolus administration of intravenous contrast. CONTRAST:  28m OMNIPAQUE IOHEXOL 300 MG/ML  SOLN COMPARISON:  CT 10 days prior 11/15/2015, as well as priors FINDINGS: Lower chest: Reticulation at the lung bases consistent with fibrosis. Bronchiectasis is stable from recent prior. Moderate hiatal hernia. Liver: No focal lesion. Hepatobiliary: Clips in the gallbladder fossa postcholecystectomy. Biliary prominence is stable and likely sequela of prior cholecystectomy. Pancreas: No ductal dilatation or inflammation. Spleen: Normal. Adrenal glands: No nodule. Kidneys: No hydronephrosis. Heterogeneous enhancement on delayed phase imaging bilaterally. Stomach/Bowel: Stomach physiologically distended. There are no dilated or thickened small bowel loops. No bowel obstruction with oral contrast reaching to the transverse colon. There is persistent but improved inflammation in the midline lower  pelvis with innumerable sigmoid colonic diverticula. Mild associated sigmoid colonic wall thickening. No abscess or extraluminal air. No evidence perforation. The appendix is surgically absent. A probable prominent appendiceal stump without inflammation. Vascular/Lymphatic: No retroperitoneal adenopathy. Abdominal aorta is normal in caliber. Atherosclerosis without aneurysm. Reproductive: Uterus surgically absent.  No adnexal mass. Bladder: Physiologically distended, no wall thickening or intravesicular air. Other: No free air, free fluid, or intra-abdominal fluid collection. Musculoskeletal: There are no acute or suspicious osseous abnormalities. Subchondral sclerosis about the right femoral head, may reflect AVN.  There is an acute minimally displaced fracture through the greater trochanter of the right hip. T9 compression deformity is only partially included, chronic based on chest CT 07/08/2015. IMPRESSION: 1. Persistent but improved fat stranding in the pelvis may reflect residual/persistent diverticulitis. No progression, abscess or perforation. 2. Heterogeneous enhancement of the kidneys again seen, recommend correlation with urinalysis to exclude urinary tract infection. 3. Acute right hip greater trochanter fracture. Electronically Signed   By: Jeb Levering M.D.   On: 11/25/2015 21:55   Ct Abdomen Pelvis W Contrast  11/15/2015  CLINICAL DATA:  Left lower quadrant abdominal pain and nausea. Patient was here Thursday for similar symptoms. EXAM: CT ABDOMEN AND PELVIS WITH CONTRAST TECHNIQUE: Multidetector CT imaging of the abdomen and pelvis was performed using the standard protocol following bolus administration of intravenous contrast. CONTRAST:  171m OMNIPAQUE IOHEXOL 300 MG/ML  SOLN COMPARISON:  07/04/2015 FINDINGS: Emphysematous changes and fibrosis in the lung bases diffuse honeycomb changes. Mild bronchiectasis. Moderate-sized esophageal hiatal hernia. Surgical absence of the gallbladder. Mild  bile duct dilatation is likely normal for postoperative physiology. The liver, spleen, pancreas, adrenal glands, abdominal aorta, inferior vena cava, and retroperitoneal lymph nodes are unremarkable. Mildly heterogeneous renal nephrograms bilaterally may represent pyelonephritis. No hydronephrosis. Stomach, small bowel, and colon are not abnormally distended. No free air or free fluid in the abdomen. Pelvis: Edema and infiltrative changes demonstrated throughout the deep pelvic fat. This appears to be arising from the sigmoid colon where there are multiple diverticula. Changes likely represent diverticulitis at the rectosigmoid junction. No discrete abscess. There has been an appendectomy but there is a prominent appendiceal stump. This is filled with contrast material and stool. Uterus is surgically absent. Bladder wall is not thickened. There is mild thickening of the wall of pelvic jejunal loops, likely reactive. Free fluid in the pelvis is likely reactive. 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 No destructive bone lesions. IMPRESSION: Diffuse infiltration and inflammation in the low pelvis likely representing diverticulitis at the rectosigmoid junction. No abscess. Somewhat heterogeneous renal nephrograms bilaterally may indicate pyelonephritis. Electronically Signed   By: WLucienne CapersM.D.   On: 11/15/2015 03:24   Dg Chest Port 1 View  11/25/2015  CLINICAL DATA:  Bibasilar rhonchi EXAM: PORTABLE CHEST 1 VIEW COMPARISON:  07/08/2015 FINDINGS: Cardiac shadow is at the upper limits of normal in size. The lungs are well aerated bilaterally. Mild interstitial changes are seen. No focal confluent infiltrate is noted. No acute bony abnormality noted. IMPRESSION: Chronic interstitial changes without acute abnormality. Electronically Signed   By: MInez CatalinaM.D.   On: 11/25/2015 20:27   Dg Hip Unilat  With Pelvis 2-3 Views Right  11/25/2015  CLINICAL DATA:   Weakness.  Fall today. EXAM: DG HIP (WITH OR WITHOUT PELVIS) 2-3V RIGHT COMPARISON:  None. FINDINGS: There is a fracture through the tip of the right greater trochanter. No visible fracture across the femoral neck. Early degenerative changes in the hips bilaterally. SI joints are symmetric and unremarkable. IMPRESSION: Fracture through the tip of the right greater trochanter. No definite fracture seen across the femoral neck. Electronically Signed   By: KRolm BaptiseM.D.   On: 11/25/2015 20:12    EKG: Atrial fibrillation with rapid ventricular rate and nonspecific ST and T-wave changes  Weights: Filed Weights   11/25/15 1921 11/26/15 0249 11/27/15 0526  Weight: 145 lb (65.772 kg) 155 lb 12.8 oz (70.67 kg) 155 lb 9.6 oz (70.58 kg)     Physical  Exam: Blood pressure 128/63, pulse 120, temperature 98.1 F (36.7 C), temperature source Oral, resp. rate 18, height 5' 4" (1.626 m), weight 155 lb 9.6 oz (70.58 kg), SpO2 97 %. Body mass index is 26.7 kg/(m^2). General: Well developed, well nourished, in no acute distress. Head eyes ears nose throat: Normocephalic, atraumatic, sclera non-icteric, no xanthomas, nares are without discharge. No apparent thyromegaly and/or mass  Lungs: Normal respiratory effort. Few wheezes, no rales, no rhonchi.  Heart: RRR with normal S1 S2. 2+ apical murmur gallop, no rub, PMI is normal size and placement, carotid upstroke normal without bruit, jugular venous pressure is normal Abdomen: Soft, non-tender, non-distended with normoactive bowel sounds. No hepatomegaly. No rebound/guarding. No obvious abdominal masses. Abdominal aorta is normal size without bruit Extremities: No edema. no cyanosis, no clubbing, no ulcers  Peripheral : 2+ bilateral upper extremity pulses, 1 + bilateral femoral pulses, 2+ bilateral dorsal pedal pulse Neuro: Alert and oriented. No facial asymmetry. No focal deficit. Moves all extremities spontaneously. Musculoskeletal: Normal muscle tone without  kyphosis Psych:  Responds to questions appropriately with a normal affect.    Assessment: 75 year old female with essential hypertension mixed hyperlipidemia diabetes chronic kidney disease stage III with diverticulitis lung disease and hip fracture having significant rapid rate of atrial fibrillation  Plan: 1. Sotalol 80 mg twice per day for maintenance of normal sinus rhythm 2. Continue anticoagulation with now no evidence of significant stroke 3. No further cardiac diagnostics necessary at this time due to no evidence of further cardiac concerns and would consider echocardiogram as an outpatient 4. Begin low-dose diltiazem for heart rate control if patient has episode of atrial fibrillation and possible to help with maintenance of normal sinus rhythm 5. No restrictions to rehabilitation postsurgically 6. Follow-up next week For further treatment options and adjustments of above  Signed, Corey Skains M.D. [facility] 11/27/2015, 5:09 PM

## 2015-11-28 LAB — GLUCOSE, CAPILLARY
GLUCOSE-CAPILLARY: 59 mg/dL — AB (ref 65–99)
GLUCOSE-CAPILLARY: 197 mg/dL — AB (ref 65–99)
Glucose-Capillary: 101 mg/dL — ABNORMAL HIGH (ref 65–99)
Glucose-Capillary: 140 mg/dL — ABNORMAL HIGH (ref 65–99)
Glucose-Capillary: 197 mg/dL — ABNORMAL HIGH (ref 65–99)

## 2015-11-28 LAB — BASIC METABOLIC PANEL
ANION GAP: 6 (ref 5–15)
BUN: 10 mg/dL (ref 6–20)
CALCIUM: 8.4 mg/dL — AB (ref 8.9–10.3)
CO2: 27 mmol/L (ref 22–32)
Chloride: 102 mmol/L (ref 101–111)
Creatinine, Ser: 0.7 mg/dL (ref 0.44–1.00)
GFR calc Af Amer: >60 mL/min (ref >60)
GLUCOSE: 73 mg/dL (ref 65–99)
POTASSIUM: 4 mmol/L (ref 3.5–5.1)
SODIUM: 135 mmol/L (ref 135–145)

## 2015-11-28 MED ORDER — GLIMEPIRIDE 2 MG PO TABS: 1.0000 mg | ORAL_TABLET | Freq: Every day | ORAL | Status: DC

## 2015-11-28 MED ORDER — METRONIDAZOLE 500 MG PO TABS
500.0000 mg | ORAL_TABLET | Freq: Three times a day (TID) | ORAL | Status: DC
  Administered 2015-11-28 – 2015-11-29 (×3): 500 mg via ORAL
  Filled 2015-11-28 (×3): qty 1

## 2015-11-28 MED ORDER — LEVOFLOXACIN 750 MG PO TABS
750.0000 mg | ORAL_TABLET | Freq: Every day | ORAL | Status: DC
  Administered 2015-11-28 – 2015-11-29 (×2): 750 mg via ORAL
  Filled 2015-11-28 (×2): qty 1

## 2015-11-28 NOTE — Progress Notes (Signed)
Inpatient Diabetes Program Recommendations  AACE/ADA: New Consensus Statement on Inpatient Glycemic Control (2015)  Target Ranges:  Prepandial:   less than 140 mg/dL      [facility] postprandial:   less than 180 mg/dL (1-2 hours)      Critically ill patients:  140 - 180 mg/dL   Review of Glycemic Control:  Results for Julie Mcmillan, Julie Mcmillan (MRN [id number]) as of 11/28/2015 11:20  Ref. Range 11/27/2015 07:46 11/27/2015 08:17 11/27/2015 09:31 11/27/2015 11:11 11/27/2015 16:35 11/27/2015 20:57 11/28/2015 07:32 11/28/2015 08:07 11/28/2015 11:14  Glucose-Capillary Latest Ref Range: 65-99 mg/dL 61 (L) 114 (H) 99 104 (H) 154 (H) 143 (H) 59 (L) 101 (H) 140 (H)   Diabetes history: Type 2 diabetes Outpatient Diabetes medications: Amaryl 2 mg daily,  Current orders for Inpatient glycemic control:  Novolog resistant tid with meals and HS, Amaryl 2 mg daily, Prednisone 40 mg daily  Inpatient Diabetes Program Recommendations:    May consider d/c of Amaryl while patient is in the hospital due to low CBG's.  Also please consider reducing Novolog correction to sensitive tid with meals.  Thanks, Adah Perl, RN, BC-ADM Inpatient Diabetes Coordinator Pager [phone] (8a-5p)

## 2015-11-28 NOTE — Clinical Social Work Note (Signed)
Clinical Social Work Assessment  Patient Details  Name: Julie Mcmillan MRN: [id number] Date of Birth: 1941-09-26  Date of referral:  11/28/15               Reason for consult:  Discharge Planning                Permission sought to share information with:  Family Supports Permission granted to share information::  Yes, Verbal Permission Granted  Name::        Agency::     Relationship::   [name] Cheral Almas- Son)  Contact Information:     Housing/Transportation Living arrangements for the past 2 months:  Apartment Source of Information:  Patient Patient Interpreter Needed:  None Criminal Activity/Legal Involvement Pertinent to Current Situation/Hospitalization:  No - Comment as needed Significant Relationships:  Adult Children Lives with:  Self Do you feel safe going back to the place where you live?  Yes Need for family participation in patient care:  No (Coment)  Care giving concerns:  Patient reports that she fears to return home in her current position. Is open to SNF placement.    Social Worker assessment / plan:  CSW was consulted by PT; SNF placement recommended. CSW met with patient at bedside. CSW explained her role. Patient reports that she lives alone in an apartment. She stated that she fell and feels that SNF would be best at this time. Patient reports that she's independent and does not want to loose her independence. CSW provided patient with SNF list. Patient preferenced [facility]. CSW gained verbal permission to refer patient to [facility] in [place]. CSW gained verbal permission to contact her son [name] if needed.   FL2/ PASRR completed and faxed to SNF's in [facility].  CSW presented bed offers to patient. Patient accepted bed at [facility]. CSW contacted [facility]- Admissions coordinator with [facility] and informed him that patient has accepted bed offer. CSW will continue to follow and assist.   Employment status:  Retired Forensic scientist:  Medicare PT Recommendations:   [place] / Referral to community resources:  [place]  Patient/Family's Response to care:  Patient is agreeable to SNF placement.   Patient/Family's Understanding of and Emotional Response to Diagnosis, Current Treatment, and Prognosis:  Patient understand CSW's role and is appreciative of her assistance.   Emotional Assessment Appearance:  Appears younger than stated age Attitude/Demeanor/Rapport:   (None) Affect (typically observed):  Accepting, Calm, Pleasant Orientation:  Oriented to Self, Oriented to Place, Oriented to  Time, Oriented to Situation Alcohol / Substance use:  Not Applicable Psych involvement (Current and /or in the community):  No (Comment)  Discharge Needs  Concerns to be addressed:  Discharge Planning Concerns Readmission within the last 30 days:  No Current discharge risk:  Chronically ill Barriers to Discharge:  Continued Medical Work up   [employer], LCSW 11/28/2015, 3:59 PM

## 2015-11-28 NOTE — NC FL2 (Signed)
[place] LEVEL OF CARE SCREENING TOOL     IDENTIFICATION  Patient Name: Julie Mcmillan Birthdate: Aug 27, 1941 Sex: female Admission Date (Current Location): 11/25/2015  [place] and [place] Number:  Engineering geologist and Address:  [facility], [address], [place], [place] [address]      Provider Number: [id number]  Attending Physician Name and Address:  Hillary Bow, MD  Relative Name and Phone Number:       Current Level of Care: Hospital Recommended Level of Care: [place] Prior Approval Number:    Date Approved/Denied:   PASRR Number:  [id number])  Discharge Plan: SNF    Current Diagnoses: Patient Active Problem List   Diagnosis Date Noted  . Fall 11/26/2015  . OP (osteoporosis) 07/18/2015  . 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 Adult hypothyroidism 07/18/2015  . BP (high blood pressure) 07/18/2015  . Fatigue 07/18/2015  . Anxiety 07/18/2015  . Diverticulitis large intestine 07/04/2015  . Paroxysmal atrial fibrillation ([place]) 04/22/2015  . Clinical depression 04/22/2015  . Atrial fibrillation with rapid ventricular response ([place]) 03/25/2015  . Atrial fibrillation ([place]) 03/25/2015  . Acquired hypothyroidism 01/11/2015  . Anemia, iron deficiency 01/10/2015  . Benign essential HTN 01/10/2015  . Type 2 diabetes mellitus ([place]) 11/04/2014  . 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 Essential (primary) hypertension 11/04/2014  . Temporary cerebral vascular dysfunction 04/08/2014  . Dry mouth 01/05/2013  . Cannot sleep 01/05/2013  . Obstructive apnea 09/02/2012  . Bursitis, ischial 05/19/2012  . Acid reflux 02/01/2012  . DD (diverticular disease) 08/14/2011    Orientation RESPIRATION BLADDER Height & Weight     Self, Time, Situation, Place  O2 (Nasal Cannula 2 (L/min) ) Continent Weight: 150 lb 11.2 oz (68.357 kg) Height:  5' 4" (162.6 cm)   BEHAVIORAL SYMPTOMS/MOOD NEUROLOGICAL BOWEL NUTRITION STATUS   (None)  (None) Continent Diet (Carb Modified )  AMBULATORY STATUS COMMUNICATION OF NEEDS Skin   Extensive Assist Verbally Normal                       Personal Care Assistance Level of Assistance  Bathing, Feeding, Dressing Bathing Assistance: Limited assistance Feeding assistance: Independent Dressing Assistance: Limited assistance     Functional Limitations Info  Sight, Hearing, Speech Sight Info: Adequate Hearing Info: Adequate Speech Info: Adequate    SPECIAL CARE FACTORS FREQUENCY  PT (By licensed PT)     PT Frequency:  (5)              Contractures      Additional Factors Info  Code Status, Allergies, Insulin Sliding Scale, Isolation Precautions Code Status Info:  (Full Code) Allergies Info:  (Amlodipine, Nexium)   Insulin Sliding Scale Info:  (insulin aspart (novoLOG) injection 0-20 Units 0-20 Units, Subcutaneous, 3 times daily with meals  & insulin aspart (novoLOG) injection 0-5 Units 0-5 Units, Subcutaneous, Daily at bedtime  ) Isolation Precautions Info:  (Droplet precaution)     Current Medications (11/28/2015):  This is the current hospital active medication list Current Facility-Administered Medications  Medication Dose Route Frequency Provider Last Rate Last Dose  . acetaminophen (TYLENOL) tablet 650 mg  650 mg Oral Q6H PRN Hillary Bow, MD   650 mg at 11/26/15 0222   Or  . acetaminophen (TYLENOL) suppository 650 mg  650 mg Rectal Q6H PRN Hillary Bow, MD      . apixaban (ELIQUIS) tablet 5 mg  5  mg Oral BID Hillary Bow, MD   5 mg at 11/28/15 1004  . dicyclomine (BENTYL) tablet 20 mg  20 mg Oral TID PRN Hillary Bow, MD      . diltiazem (CARDIZEM CD) 24 hr capsule 120 mg  120 mg Oral Daily Corey Skains, MD   120 mg at 11/28/15 1004  . diltiazem (CARDIZEM) 100 mg in dextrose 5 % 100 mL (1 mg/mL) infusion  5-15 mg/hr Intravenous Titrated Bettey Costa, MD   Stopped at 11/27/15 1459   . [START ON 11/29/2015] glimepiride (AMARYL) tablet 1 mg  1 mg Oral Daily Srikar Sudini, MD      . hydrALAZINE (APRESOLINE) tablet 25 mg  25 mg Oral TID Hillary Bow, MD   25 mg at 11/28/15 1004  . insulin aspart (novoLOG) injection 0-20 Units  0-20 Units Subcutaneous TID WC Hillary Bow, MD   3 Units at 11/28/15 1222  . insulin aspart (novoLOG) injection 0-5 Units  0-5 Units Subcutaneous QHS Hillary Bow, MD   0 Units at 11/26/15 0153  . ipratropium-albuterol (DUONEB) 0.5-2.5 (3) MG/3ML nebulizer solution 3 mL  3 mL Nebulization Q4H PRN Srikar Sudini, MD      . levofloxacin (LEVAQUIN) IVPB 750 mg  750 mg Intravenous Q24H Vena Rua, RPH   750 mg at 11/27/15 1725  . levothyroxine (SYNTHROID, LEVOTHROID) tablet 50 mcg  50 mcg Oral QAC breakfast Hillary Bow, MD   50 mcg at 11/28/15 0809  . losartan (COZAAR) tablet 100 mg  100 mg Oral Daily Hillary Bow, MD   100 mg at 11/28/15 1003  . metroNIDAZOLE (FLAGYL) tablet 500 mg  500 mg Oral BID Bettey Costa, MD   500 mg at 11/28/15 1003  . oxyCODONE (Oxy IR/ROXICODONE) immediate release tablet 5 mg  5 mg Oral Q4H PRN Hillary Bow, MD   5 mg at 11/26/15 1319  . pantoprazole (PROTONIX) EC tablet 40 mg  40 mg Oral Daily Hillary Bow, MD   40 mg at 11/28/15 1004  . polyethylene glycol (MIRALAX / GLYCOLAX) packet 17 g  17 g Oral Daily PRN Srikar Sudini, MD      . predniSONE (DELTASONE) tablet 40 mg  40 mg Oral Q breakfast Bettey Costa, MD   40 mg at 11/28/15 0809  . sodium chloride flush (NS) 0.9 % injection 3 mL  3 mL Intravenous Q12H Srikar Sudini, MD   3 mL at 11/28/15 1005  . sotalol (BETAPACE) tablet 80 mg  80 mg Oral Q12H Lance Coon, MD   80 mg at 11/28/15 1004  . venlafaxine XR (EFFEXOR-XR) 24 hr capsule 150 mg  150 mg Oral Q lunch Hillary Bow, MD   150 mg at 11/28/15 1223     Discharge Medications: Please see discharge summary for a list of discharge medications.  Relevant Imaging Results:  Relevant Lab Results:   Additional  Information  (SSN [id number])  Lorenso Quarry Carroll [place], LCSW

## 2015-11-28 NOTE — Progress Notes (Signed)
Physical Therapy Treatment Patient Details Name: Julie Mcmillan MRN: [id number] DOB: 01/17/1941 Today's Date: 11/28/2015    History of Present Illness Pt had a fall at home and suffered an avulsion fracture of the R greater trochanter.  She is supposed to avoid AROM hip ABd and is PWBing.     PT Comments    Pt participates well with supine and seated bed exercises. Demonstrates improved bed mobility transfers and gait with less physical assist required and improved understanding of R PWB status with pt education. Pt mildly anxious, but less so with consistent cueing/education. Pt demonstrates ability to take steps with Right lower extremity inconsistently. Pt is compliant with R PWB with ambulation. Pt received up in chair comfortably. Continue PT for progression of range of motion, strength and all functional mobility.   Follow Up Recommendations  SNF     Equipment Recommendations  Rolling walker with 5" wheels    Recommendations for Other Services       Precautions / Restrictions Precautions Precautions: Fall Restrictions Weight Bearing Restrictions: Yes RLE Weight Bearing: Partial weight bearing    Mobility  Bed Mobility Overal bed mobility: Needs Assistance Bed Mobility: Supine to Sit     Supine to sit: Mod assist Sit to supine: HOB elevated;Mod assist   General bed mobility comments: supine to sit to the left pt requires Min A for RLE and Mod A for trunk elevation  Transfers Overall transfer level: Needs assistance Equipment used: Rolling walker (2 wheeled) Transfers: Sit to/from Stand Sit to Stand: Min assist (cues for hand placement)         General transfer comment: Cues and explanation of PWB; pt has better understanding of PWB on R; pt mildly hesitant, but eventually performs well with less assist  Ambulation/Gait Ambulation/Gait assistance: Min guard Ambulation Distance (Feet): 5 Feet Assistive device: Rolling walker (2 wheeled) Gait  Pattern/deviations: Step-to pattern (PWB R) Gait velocity: reduced Gait velocity interpretation: <1.8 ft/sec, indicative of risk for recurrent falls General Gait Details: Explanation of PWB technique with ambulation. Pt with improved understanding. Pt inconsistent with steps and alternates stepping and pivoting on LLE. Pt compliant with R PWB when she does step with good use of BUEs    Stairs            Wheelchair Mobility    Modified Rankin (Stroke Patients Only)       Balance Overall balance assessment: Needs assistance Sitting-balance support: Bilateral upper extremity supported Sitting balance-Leahy Scale: Fair     Standing balance support: Bilateral upper extremity supported Standing balance-Leahy Scale: Fair                      Cognition Arousal/Alertness: Awake/alert Behavior During Therapy: WFL for tasks assessed/performed Overall Cognitive Status: Within Functional Limits for tasks assessed                      Exercises General Exercises - Lower Extremity Ankle Circles/Pumps: AROM;Both;20 reps;Supine Quad Sets: Strengthening;Both;20 reps;Supine Gluteal Sets: Strengthening;Both;20 reps;Supine Short Arc Quad: AROM;Both;20 reps;Supine Long Arc Quad: AROM;Right;10 reps;Seated (partial range) Heel Slides: AROM;Right;20 reps;Supine (RROM L) Hip ABduction/ADduction: Strengthening;Left;20 reps;Supine Straight Leg Raises: AAROM;Right;10 reps;Supine (L AROM) Hip Flexion/Marching: AROM;Right;10 reps;Seated    General Comments        Pertinent Vitals/Pain Pain Assessment: 0-10 Pain Score: 5  Pain Location: R hip Pain Intervention(s): Monitored during session;Repositioned    Home Living  Prior Function            PT Goals (current goals can now be found in the care plan section) Progress towards PT goals: Progressing toward goals    Frequency  7X/week    PT Plan Current plan remains appropriate     Co-evaluation             End of Session Equipment Utilized During Treatment: Gait belt Activity Tolerance: Patient tolerated treatment well Patient left: in chair;with call bell/phone within reach;with chair alarm set (SW in the room)     Time: 1517-6160 PT Time Calculation (min) (ACUTE ONLY): 38 min  Charges:  $Gait Training: 8-22 mins $Therapeutic Exercise: 23-37 mins                    G Codes:      Charlaine Dalton 11/28/2015, 2:44 PM

## 2015-11-28 NOTE — Progress Notes (Signed)
[facility] at [place] NAME: Julie Mcmillan    MR#:  [id number]  DATE OF BIRTH:  03/27/1941  SUBJECTIVE:   Admitted for fever and bronchitis. Had right hip pain after fall. Continues to have pain with minimal movement. Could not work with physical therapy yesterday due to atrial fibrillation with rapid ventricular rate. Heart rate is improved today. Continues to be on oxygen.  REVIEW OF SYSTEMS:    Review of Systems  Constitutional: Positive for malaise/fatigue. Negative for fever and chills.  HENT: Negative for sore throat.   Eyes: Negative for blurred vision.  Respiratory: Positive for cough, sputum production, shortness of breath and wheezing. Negative for hemoptysis.   Cardiovascular: Negative for chest pain, palpitations and leg swelling.  Gastrointestinal: Negative for nausea, vomiting, abdominal pain, diarrhea and blood in stool.  Genitourinary: Negative for dysuria.  Musculoskeletal: Positive for falls. Negative for back pain and joint pain.  Neurological: Negative for dizziness, tremors and headaches.  Endo/Heme/Allergies: Does not bruise/bleed easily.    Tolerating Diet:yes      DRUG ALLERGIES:   Allergies  Allergen Reactions  . Amlodipine Swelling  . Nexium [Esomeprazole Magnesium] Diarrhea    VITALS:  Blood pressure 133/57, pulse 78, temperature 97.8 F (36.6 C), temperature source Oral, resp. rate 18, height _0  (1.626 m), weight 68.357 kg (150 lb 11.2 oz), SpO2 93 %.  PHYSICAL EXAMINATION:   Physical Exam  Constitutional: She is oriented to person, place, and time and well-developed, well-nourished, and in no distress. No distress.  HENT:  Head: Normocephalic.  Eyes: No scleral icterus.  Neck: Normal range of motion. Neck supple. No JVD present. No tracheal deviation present.  Cardiovascular: Normal rate, regular rhythm and normal heart sounds.  Exam reveals no gallop and no friction rub.   No  murmur heard. Pulmonary/Chest: Effort normal. No respiratory distress. She has wheezes. She has rales. She exhibits no tenderness.  Abdominal: Soft. Bowel sounds are normal. She exhibits no distension and no mass. There is no tenderness. There is no rebound and no guarding.  Musculoskeletal: She exhibits no edema.  Right hip decreased range of motion with tenderness.  Neurological: She is alert and oriented to person, place, and time.  Skin: Skin is warm. No rash noted. No erythema.  Psychiatric: Affect and judgment normal.      LABORATORY PANEL:   CBC  Recent Labs Lab 11/27/15 0600  WBC 9.2  HGB 12.1  HCT 36.3  PLT 310   ------------------------------------------------------------------------------------------------------------------  Chemistries   Recent Labs Lab 11/25/15 1937  11/28/15 0431  NA 133*  < > 135  K 3.7  < > 4.0  CL 100*  < > 102  CO2 24  < > 27  GLUCOSE 201*  < > 73  BUN 15  < > 10  CREATININE 1.20*  < > 0.70  CALCIUM 8.7*  < > 8.4*  AST 37  --   --   ALT 15  --   --   ALKPHOS 74  --   --   BILITOT 0.5  --   --   < > = values in this interval not displayed. ------------------------------------------------------------------------------------------------------------------  Cardiac Enzymes No results for input(s): TROPONINI in the last 168 hours. ------------------------------------------------------------------------------------------------------------------  RADIOLOGY:  Dg Chest 1 View  11/27/2015  CLINICAL DATA:  Hypertension, diabetes EXAM: CHEST 1 VIEW COMPARISON:  11/25/2015 FINDINGS: Peribronchial thickening and interstitial prominence, similar to prior study. Heart is borderline in size. No confluent  opacities or effusions. No acute bony abnormality. IMPRESSION: Chronic interstitial lung changes. Electronically Signed   By: Rolm Baptise M.D.   On: 11/27/2015 10:45   ASSESSMENT AND PLAN:   75 year old female with history of atrial  fibrillation on anticoagulation, diabetes and IBS who presented after fall and right hip pain and subsequently found to be hypoxic.  * Acute hypoxic respiratory failure due to acute bronchitis from influenza A -IV steroids, Antibiotics - Scheduled Nebulizers - Inhalers -Wean O2 as tolerated - Consult pulmonary if no improvement  * Minimally displaced Right great trochanter fracture:  Physical therapy consulted avoid resisted abduction exercises for about 6 weeks, partial weightbearing for about 3 weeks. F/U orthopedics in 3 weeks as outpatient. No need for surgery as per orthopedics.  * Recent sigmoid diverticulitis: CT of abdomen showed persistent findings. Continue Levaquin and Flagyl.  * Chronic diarrhea with IBS C. difficile negative   * Atrial fibrillation: Wean diltiazem drip. Continue sotalol and ELIQUIS  *  Hypothyroid: Continue Synthroid.  * Essential hypertension Continue losartan and hydralazine.  * Diabetes Continue sliding scale insulin, ADA diet and Amaryl.  Management plans discussed with the patient and she is in agreement.  CODE STATUS: FULL  TOTAL TIME TAKING CARE OF THIS PATIENT: 30 minutes.   POSSIBLE D/C 1-2, DEPENDING ON CLINICAL CONDITION.  May need SNF  Hillary Bow R M.D on 11/28/2015 at 1:02 PM  Between 7am to 6pm - Pager - [phone]. After 6pm go to [email or web address] - password [email or web address] [facility]  Office  [phone]  CC: Primary care physician; Glendon Axe, MD  Note: This dictation was prepared with Dragon dictation along with smaller phrase technology. Any transcriptional errors that result from this process are unintentional.

## 2015-11-28 NOTE — Consult Note (Signed)
Patient has a minimally displaced  greater trochanter fracture.  Ortho is not recommending any surgical intervention. Physical therapy is recommending skilled nursing placement.  CSW is aware

## 2015-11-28 NOTE — Progress Notes (Signed)
[facility] Encounter Note  Patient: Julie Mcmillan / Admit Date: 11/25/2015 / Date of Encounter: 11/28/2015, 7:53 AM   Subjective: Patient has felt quite well today with no evidence of episodes of irregular heartbeat or atrial fibrillation overnight. Patient is now on multiple medications for heart rate control  Review of Systems: Positive for: Limb pain Negative for: Vision change, hearing change, syncope, dizziness, nausea, vomiting,diarrhea, bloody stool, stomach pain, cough, congestion, diaphoresis, urinary frequency, urinary pain,skin lesions, skin rashes Others previously listed  Objective: Telemetry: Normal sinus rhythm Physical Exam: Blood pressure 133/64, pulse 77, temperature 97.8 F (36.6 C), temperature source Oral, resp. rate 16, height _0  (1.626 m), weight 150 lb 11.2 oz (68.357 kg), SpO2 96 %. Body mass index is 25.85 kg/(m^2). General: Well developed, well nourished, in no acute distress. Head: Normocephalic, atraumatic, sclera non-icteric, no xanthomas, nares are without discharge. Neck: No apparent masses Lungs: Normal respirations with no wheezes, no rhonchi, no rales , no crackles   Heart: Regular rate and rhythm, normal S1 S2, no murmur, no rub, no gallop, PMI is normal size and placement, carotid upstroke normal without bruit, jugular venous pressure normal Abdomen: Soft, non-tender, non-distended with normoactive bowel sounds. No hepatosplenomegaly. Abdominal aorta is normal size without bruit Extremities: No edema, no clubbing, no cyanosis, no ulcers,  Peripheral: 2+ radial, 2+ femoral, 2+ dorsal pedal pulses Neuro: Alert and oriented. Moves all extremities spontaneously. Psych:  Responds to questions appropriately with a normal affect.   Intake/Output Summary (Last 24 hours) at 11/28/15 0753 Last data filed at 11/28/15 0616  Gross per 24 hour  Intake    150 ml  Output   1500 ml  Net  -1350 ml    Inpatient Medications:  .  apixaban  5 mg Oral BID  . diltiazem  120 mg Oral Daily  . glimepiride  2 mg Oral Daily  . hydrALAZINE  25 mg Oral TID  . insulin aspart  0-20 Units Subcutaneous TID WC  . insulin aspart  0-5 Units Subcutaneous QHS  . levofloxacin (LEVAQUIN) IV  750 mg Intravenous Q24H  . levothyroxine  50 mcg Oral QAC breakfast  . losartan  100 mg Oral Daily  . metroNIDAZOLE  500 mg Oral BID  . pantoprazole  40 mg Oral Daily  . predniSONE  40 mg Oral Q breakfast  . sodium chloride flush  3 mL Intravenous Q12H  . sotalol  80 mg Oral Q12H  . venlafaxine XR  150 mg Oral Q lunch   Infusions:  . diltiazem (CARDIZEM) infusion Stopped (11/27/15 1459)    Labs:  Recent Labs  11/27/15 0600 11/28/15 0431  NA 133* 135  K 3.7 4.0  CL 102 102  CO2 24 27  GLUCOSE 62* 73  BUN 7 10  CREATININE 0.58 0.70  CALCIUM 8.0* 8.4*    Recent Labs  11/25/15 1937  AST 37  ALT 15  ALKPHOS 74  BILITOT 0.5  PROT 6.8  ALBUMIN 3.7    Recent Labs  11/26/15 0435 11/27/15 0600  WBC 8.3 9.2  HGB 11.0* 12.1  HCT 33.4* 36.3  MCV 80.8 79.5*  PLT 305 310    Recent Labs  11/25/15 1937  CKTOTAL 137   Invalid input(s): POCBNP No results for input(s): HGBA1C in the last 72 hours.   Weights: Filed Weights   11/26/15 0249 11/27/15 0526 11/28/15 0500  Weight: 155 lb 12.8 oz (70.67 kg) 155 lb 9.6 oz (70.58 kg) 150 lb 11.2 oz (68.357  kg)     Radiology/Studies:  Dg Chest 1 View  11/27/2015  CLINICAL DATA:  Hypertension, diabetes EXAM: CHEST 1 VIEW COMPARISON:  11/25/2015 FINDINGS: Peribronchial thickening and interstitial prominence, similar to prior study. Heart is borderline in size. No confluent opacities or effusions. No acute bony abnormality. IMPRESSION: Chronic interstitial lung changes. Electronically Signed   By: Rolm Baptise M.D.   On: 11/27/2015 10:45   Ct Head Wo Contrast  11/25/2015  CLINICAL DATA:  Fall earlier today.  Found on floor. EXAM: CT HEAD WITHOUT CONTRAST TECHNIQUE: Contiguous axial  images were obtained from the base of the skull through the vertex without intravenous contrast. COMPARISON:  None. FINDINGS: Mild age related volume loss. Mild chronic small vessel disease throughout the deep white matter. No acute intracranial abnormality. Specifically, no hemorrhage, hydrocephalus, mass lesion, acute infarction, or significant intracranial injury. No acute calvarial abnormality. Visualized paranasal sinuses and mastoids clear. Orbital soft tissues unremarkable. IMPRESSION: No acute intracranial abnormality. Atrophy, chronic microvascular disease. Electronically Signed   By: Rolm Baptise M.D.   On: 11/25/2015 21:40   Ct Abdomen Pelvis W Contrast  11/25/2015  CLINICAL DATA:  Left lower quadrant pain, diarrhea. Generalized weakness. Fall today. History of diverticulitis. EXAM: CT ABDOMEN AND PELVIS WITH CONTRAST TECHNIQUE: Multidetector CT imaging of the abdomen and pelvis was performed using the standard protocol following bolus administration of intravenous contrast. CONTRAST:  76m OMNIPAQUE IOHEXOL 300 MG/ML  SOLN COMPARISON:  CT 10 days prior 11/15/2015, as well as priors FINDINGS: Lower chest: Reticulation at the lung bases consistent with fibrosis. Bronchiectasis is stable from recent prior. Moderate hiatal hernia. Liver: No focal lesion. Hepatobiliary: Clips in the gallbladder fossa postcholecystectomy. Biliary prominence is stable and likely sequela of prior cholecystectomy. Pancreas: No ductal dilatation or inflammation. Spleen: Normal. Adrenal glands: No nodule. Kidneys: No hydronephrosis. Heterogeneous enhancement on delayed phase imaging bilaterally. Stomach/Bowel: Stomach physiologically distended. There are no dilated or thickened small bowel loops. No bowel obstruction with oral contrast reaching to the transverse colon. There is persistent but improved inflammation in the midline lower pelvis with innumerable sigmoid colonic diverticula. Mild associated sigmoid colonic wall  thickening. No abscess or extraluminal air. No evidence perforation. The appendix is surgically absent. A probable prominent appendiceal stump without inflammation. Vascular/Lymphatic: No retroperitoneal adenopathy. Abdominal aorta is normal in caliber. Atherosclerosis without aneurysm. Reproductive: Uterus surgically absent.  No adnexal mass. Bladder: Physiologically distended, no wall thickening or intravesicular air. Other: No free air, free fluid, or intra-abdominal fluid collection. Musculoskeletal: There are no acute or suspicious osseous abnormalities. Subchondral sclerosis about the right femoral head, may reflect AVN. There is an acute minimally displaced fracture through the greater trochanter of the right hip. T9 compression deformity is only partially included, chronic based on chest CT 07/08/2015. IMPRESSION: 1. Persistent but improved fat stranding in the pelvis may reflect residual/persistent diverticulitis. No progression, abscess or perforation. 2. Heterogeneous enhancement of the kidneys again seen, recommend correlation with urinalysis to exclude urinary tract infection. 3. Acute right hip greater trochanter fracture. Electronically Signed   By: MJeb LeveringM.D.   On: 11/25/2015 21:55   Ct Abdomen Pelvis W Contrast  11/15/2015  CLINICAL DATA:  Left lower quadrant abdominal pain and nausea. Patient was here Thursday for similar symptoms. EXAM: CT ABDOMEN AND PELVIS WITH CONTRAST TECHNIQUE: Multidetector CT imaging of the abdomen and pelvis was performed using the standard protocol following bolus administration of intravenous contrast. 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 Mild bronchiectasis. Moderate-sized esophageal hiatal hernia. Surgical absence of the gallbladder. Mild bile duct dilatation is likely normal for postoperative physiology. The liver, spleen,  pancreas, adrenal glands, abdominal aorta, inferior vena cava, and retroperitoneal lymph nodes are unremarkable. Mildly heterogeneous renal nephrograms bilaterally may represent pyelonephritis. No hydronephrosis. Stomach, small bowel, and colon are not abnormally distended. No free air or free fluid in the abdomen. Pelvis: Edema and infiltrative changes demonstrated throughout the deep pelvic fat. This appears to be arising from the sigmoid colon where there are multiple diverticula. Changes likely represent diverticulitis at the rectosigmoid junction. No discrete abscess. There has been an appendectomy but there is a prominent appendiceal stump. This is filled with contrast material and stool. Uterus is surgically absent. Bladder wall is not thickened. There is mild thickening of the wall of pelvic jejunal loops, likely reactive. Free fluid in the pelvis is likely reactive. 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 No destructive bone lesions. IMPRESSION: Diffuse infiltration and inflammation in the low pelvis likely representing diverticulitis at the rectosigmoid junction. No abscess. Somewhat heterogeneous renal nephrograms bilaterally may indicate pyelonephritis. Electronically Signed   By: Lucienne Capers M.D.   On: 11/15/2015 03:24   Dg Chest Port 1 View  11/25/2015  CLINICAL DATA:  Bibasilar rhonchi EXAM: PORTABLE CHEST 1 VIEW COMPARISON:  07/08/2015 FINDINGS: Cardiac shadow is at the upper limits of normal in size. The lungs are well aerated bilaterally. Mild interstitial changes are seen. No focal confluent infiltrate is noted. No acute bony abnormality noted. IMPRESSION: Chronic interstitial changes without acute abnormality. Electronically Signed   By: Inez Catalina M.D.   On: 11/25/2015 20:27   Dg Hip Unilat  With Pelvis 2-3 Views Right  11/25/2015  CLINICAL DATA:  Weakness.  Fall today. EXAM: DG HIP (WITH OR WITHOUT PELVIS) 2-3V RIGHT COMPARISON:  None.  FINDINGS: There is a fracture through the tip of the right greater trochanter. No visible fracture across the femoral neck. Early degenerative changes in the hips bilaterally. SI joints are symmetric and unremarkable. IMPRESSION: Fracture through the tip of the right greater trochanter. No definite fracture seen across the femoral neck. Electronically Signed   By: Rolm Baptise M.D.   On: 11/25/2015 20:12     Assessment and Recommendation  75 y.o. female with essential hypertension mixed hyperlipidemia and diverticulitis with hip fracture now significantly improved with atrial fibrillation with rapid ventricular rate now in normal sinus rhythm with good heart rate control and no current evidence of myocardial infarction and/or heart failure 1. Continue sotalol for maintenance of normal sinus rhythm 2. Low-dose long-acting diltiazem for heart rate control 3. Discontinuation of diltiazem drip 4. Hypertension control with angiotensin receptor blocker and hydralazine as necessary 5. Anticoagulation for further risk reduction in stroke with atrial fibrillation 6. No further cardiac diagnostics necessary at this time 7. Patient may go to home back to floor for rehabilitation with follow-up from cardiac standpoint in one to 2 weeks  Signed, Serafina Royals M.D. FACC

## 2015-11-29 DIAGNOSIS — J9601 Acute respiratory failure with hypoxia: Secondary | ICD-10-CM | POA: Diagnosis present

## 2015-11-29 DIAGNOSIS — S72111A Displaced fracture of greater trochanter of right femur, initial encounter for closed fracture: Secondary | ICD-10-CM | POA: Diagnosis present

## 2015-11-29 LAB — GLUCOSE, CAPILLARY
GLUCOSE-CAPILLARY: 69 mg/dL (ref 65–99)
GLUCOSE-CAPILLARY: 71 mg/dL (ref 65–99)
GLUCOSE-CAPILLARY: 108 mg/dL — AB (ref 65–99)
GLUCOSE-CAPILLARY: 200 mg/dL — AB (ref 65–99)
Glucose-Capillary: 57 mg/dL — ABNORMAL LOW (ref 65–99)

## 2015-11-29 MED ORDER — OXYCODONE-ACETAMINOPHEN 5-325 MG PO TABS: 1.0000 | ORAL_TABLET | Freq: Four times a day (QID) | ORAL | Status: DC | PRN

## 2015-11-29 MED ORDER — DILTIAZEM HCL ER COATED BEADS 120 MG PO CP24: 120.0000 mg | ORAL_CAPSULE | Freq: Every day | ORAL | Status: DC

## 2015-11-29 MED ORDER — IPRATROPIUM-ALBUTEROL 0.5-2.5 (3) MG/3ML IN SOLN: 3.0000 mL | RESPIRATORY_TRACT | Status: DC | PRN

## 2015-11-29 MED ORDER — METFORMIN HCL 500 MG PO TABS: 500.0000 mg | ORAL_TABLET | Freq: Two times a day (BID) | ORAL | Status: DC

## 2015-11-29 MED ORDER — LEVOFLOXACIN 750 MG PO TABS: 750.0000 mg | ORAL_TABLET | Freq: Every day | ORAL | Status: DC

## 2015-11-29 MED ORDER — METFORMIN HCL 500 MG PO TABS
500.0000 mg | ORAL_TABLET | Freq: Two times a day (BID) | ORAL | Status: DC
  Administered 2015-11-29: 500 mg via ORAL
  Filled 2015-11-29: qty 1

## 2015-11-29 MED ORDER — PREDNISONE 20 MG PO TABS: 40.0000 mg | ORAL_TABLET | Freq: Every day | ORAL | Status: DC

## 2015-11-29 NOTE — Care Management Important Message (Signed)
Important Message  Patient Details  Name: Julie Mcmillan MRN: [id number] Date of Birth: 1941-09-08   Medicare Important Message Given:  Yes    Katrina Stack, RN 11/29/2015, 11:36 AM

## 2015-11-29 NOTE — Progress Notes (Signed)
Report called to [facility] at [facility]. Will call EMS at 4:30 per their request as bed is not ready at facility.

## 2015-11-29 NOTE — Progress Notes (Signed)
Per Broadus John ([facility] admissions), wait until 4:30 this afternoon to call for transportation to ensure that patient's room at [facility] will be clean and ready for her.

## 2015-11-29 NOTE — Progress Notes (Signed)
Ems called for transport. Patient being discharged to [facility]. Will removed iv's when ems arrives

## 2015-11-29 NOTE — Progress Notes (Signed)
Patient's CBG up to 71 after snack. Asymptomatic. Eating breakfast now.

## 2015-11-29 NOTE — Progress Notes (Signed)
Physical Therapy Treatment Patient Details Name: Julie Mcmillan MRN: [id number] DOB: 23-Nov-1940 Today's Date: 11/29/2015    History of Present Illness Pt had a fall at home and suffered an avulsion fracture of the R greater trochanter.  She is supposed to avoid AROM hip ABd and is PWBing.     PT Comments    Pt demonstrating improved technique with all functional mobility although continues to require a lot of cueing and reinforcement that she is performing correctly. Pt lacks confidence at this time. Improved distance with ambulation; requires mild assist on turns. Pt notes performing some exercises in bed independently and feels  they are helping her to move right leg easier; agreed. Pt received up in chair comfortably. At time of this documentation; pt has discharge orders to skilled nursing facility.   Follow Up Recommendations  SNF     Equipment Recommendations  Rolling walker with 5" wheels    Recommendations for Other Services       Precautions / Restrictions Precautions Precautions: Fall Restrictions Weight Bearing Restrictions: Yes RLE Weight Bearing: Partial weight bearing    Mobility  Bed Mobility Overal bed mobility: Needs Assistance Bed Mobility: Supine to Sit     Supine to sit: Min assist     General bed mobility comments: Improved ability to move RLE to the L for sitting and elevate trunk independently  Transfers Overall transfer level: Needs assistance Equipment used: Rolling walker (2 wheeled) Transfers: Sit to/from Stand Sit to Stand: Min assist         General transfer comment: Cues for safe hand placement. slow to rise and extend trunk to upright position  Ambulation/Gait Ambulation/Gait assistance: Min guard;Min assist Ambulation Distance (Feet): 40 Feet Assistive device: Rolling walker (2 wheeled) Gait Pattern/deviations: Step-to pattern (PWB on L) Gait velocity: reduced Gait velocity interpretation: Below normal speed for  age/gender General Gait Details: Continued education/cues for UE use for PWB technique and sequence. Pt demonstrates understanding; lacks confidence. Does require some assist on turns. Pt also educated on appropriate height for rw for optimal UE use for deweighting   Stairs            Wheelchair Mobility    Modified Rankin (Stroke Patients Only)       Balance Overall balance assessment: Needs assistance Sitting-balance support: Feet supported Sitting balance-Leahy Scale: Good     Standing balance support: Bilateral upper extremity supported Standing balance-Leahy Scale: Fair                      Cognition Arousal/Alertness: Awake/alert Behavior During Therapy: WFL for tasks assessed/performed Overall Cognitive Status: Within Functional Limits for tasks assessed                      Exercises      General Comments        Pertinent Vitals/Pain Pain Assessment: 0-10 Pain Score: 4  Pain Location: R hip Pain Intervention(s): Limited activity within patient's tolerance;Monitored during session;Repositioned    Home Living                      Prior Function            PT Goals (current goals can now be found in the care plan section) Progress towards PT goals: Progressing toward goals    Frequency  7X/week    PT Plan Current plan remains appropriate    Co-evaluation  End of Session Equipment Utilized During Treatment: Gait belt Activity Tolerance: Patient tolerated treatment well;No increased pain;Patient limited by fatigue Patient left: in chair;with call bell/phone within reach;with chair alarm set     Time: 1207-1230 PT Time Calculation (min) (ACUTE ONLY): 23 min  Charges:  $Gait Training: 8-22 mins $Therapeutic Activity: 8-22 mins                    G Codes:      Charlaine Dalton, PTA 11/29/2015, 2:37 PM

## 2015-11-29 NOTE — Progress Notes (Signed)
Ems on the floor to transport patient to [facility]. Patient transported on RA, no distress noted. Iv removed x2. Telemetry removed, ccmd made aware of discharge.

## 2015-11-29 NOTE — Discharge Summary (Signed)
[facility] NAME: Julie Mcmillan    MR#:  [id number]  DATE OF BIRTH:  November 08, 1940  DATE OF ADMISSION:  11/25/2015 ADMITTING PHYSICIAN: Hillary Bow, MD  DATE OF DISCHARGE: No discharge date for patient encounter.  PRIMARY CARE PHYSICIAN: Singh,Jasmine, MD   ADMISSION DIAGNOSIS:  Diarrhea of presumed infectious origin [A09] Bibasilar crackles [R09.89] Acute respiratory failure with hypoxia ([place]) [J96.01] Diverticulitis of large intestine without perforation or abscess without bleeding [K57.32] Greater trochanter fracture, right, closed, initial encounter [S72.111A]  DISCHARGE DIAGNOSIS:  Active Problems:   Type 2 diabetes mellitus (HCC)   Atrial fibrillation ([place])   Fall   Acute bronchitis   Acute respiratory failure with hypoxia (HCC)   Fracture of greater trochanter of right femur ([place])   SECONDARY DIAGNOSIS:   Past Medical History  Diagnosis Date  . A-fib ([place])   . Hypertension   . IBS (irritable bowel syndrome)   . Shortness of breath dyspnea   . GERD (gastroesophageal reflux disease)   . Interstitial lung disease ([place])   . Seasonal affective disorder ([place])   . Type 2 diabetes mellitus ([place]) 11/04/2014  . IBS (irritable bowel syndrome)   . Diverticulitis   . Chronic diarrhea      ADMITTING HISTORY  Julie Mcmillan is a 75 y.o. female with a known history of atrial fibrillation, hypertension, diabetes, irritable bowel syndrome with chronic diarrhea presents to the emergency room due to a fall and acute right hip pain. Patient was seen on 11/15/2015 in the emergency room and diagnosed with sigmoid diverticulitis and placed on ciprofloxacin and Flagyl for 1 week. She finished these medications. 3 days back she started having cough and sputum with wheezing and was placed on amoxicillin. Today she was found to be hypoxic at 87% on room air in the emergency room. She has had chronic on and off diarrhea and a CT  scan abdomen was repeated in the emergency room of which showed improved but persistent changes of diverticulitis in the sigmoid area. X-ray showed greater trochanter fracture of the right hip. She has been given 1 dose of IV Zosyn in the emergency room. Patient is not sure if she blacked out during the fall but she thinks she could have passed out for a few seconds. She was laying on the floor for 3-4 hours due to pain and no help at home.  HOSPITAL COURSE:   75 year old female with history of atrial fibrillation on anticoagulation, diabetes and IBS who presented after fall and right hip pain and subsequently found to be hypoxic.  * Acute hypoxic respiratory failure due to acute bronchitis from influenza A -IV steroids, Antibiotics - Scheduled Nebulizers - Inhalers -Wean O2 as tolerated  Change to prednisone, duoneb PRN and Levaquin  * Minimally displaced Right great trochanter fracture:  Physical therapy consulted - recommended SNF avoid resisted abduction exercises for about 6 weeks, partial weightbearing for about 3 weeks. F/U orthopedics in 3 weeks as outpatient. No need for surgery as per orthopedics.  * Recent sigmoid diverticulitis: CT of abdomen showed persistent findings. Continued Levaquin and Flagyl. Stop flagyl at d/c. No abd pain/tenderness  * Chronic diarrhea with IBS C. difficile negative   * Atrial fibrillation: Cardizem PO added. Continue sotalol and ELIQUIS.  * Hypothyroid: Continue Synthroid.  * Essential hypertension Continue losartan and hydralazine.  * Diabetes Hypoglycemia with Amaryl. Stopped. Start metformin.  Stable for discharge to SNF  CONSULTS OBTAINED:  Treatment Team:  Hessie Knows, MD Corey Skains, MD  DRUG ALLERGIES:   Allergies  Allergen Reactions  . Amlodipine Swelling  . Nexium [Esomeprazole Magnesium] Diarrhea    DISCHARGE MEDICATIONS:   Current Discharge Medication List    START taking these medications    Details  diltiazem (CARDIZEM CD) 120 MG 24 hr capsule Take 1 capsule (120 mg total) by mouth daily. Qty: 30 capsule, Refills: 0    ipratropium-albuterol (DUONEB) 0.5-2.5 (3) MG/3ML SOLN Take 3 mLs by nebulization every 4 (four) hours as needed. Qty: 360 mL    levofloxacin (LEVAQUIN) 750 MG tablet Take 1 tablet (750 mg total) by mouth daily. Qty: 4 tablet, Refills: 0    metFORMIN (GLUCOPHAGE) 500 MG tablet Take 1 tablet (500 mg total) by mouth 2 (two) times daily with a meal. Qty: 30 tablet, Refills: 0    predniSONE (DELTASONE) 20 MG tablet Take 2 tablets (40 mg total) by mouth daily with breakfast. Qty: 4 tablet, Refills: 0      CONTINUE these medications which have CHANGED   Details  oxyCODONE-acetaminophen (ROXICET) 5-325 MG tablet Take 1 tablet by mouth every 6 (six) hours as needed. Qty: 30 tablet, Refills: 0      CONTINUE these medications which have NOT CHANGED   Details  apixaban (ELIQUIS) 5 MG TABS tablet Take 5 mg by mouth 2 (two) times daily.    Calcium Carbonate-Vitamin D (CALCIUM 600+D) 600-200 MG-UNIT TABS Take 1 tablet by mouth daily.    dicyclomine (BENTYL) 20 MG tablet Take 1 tablet (20 mg total) by mouth 3 (three) times daily as needed for spasms. Qty: 30 tablet, Refills: 0    hydrALAZINE (APRESOLINE) 25 MG tablet Take 25 mg by mouth 3 (three) times daily.    levothyroxine (SYNTHROID, LEVOTHROID) 50 MCG tablet Take 50 mcg by mouth daily before breakfast.    losartan (COZAAR) 100 MG tablet Take 100 mg by mouth daily.     Multiple Vitamin (MULTIVITAMIN WITH MINERALS) TABS tablet Take 1 tablet by mouth daily.    ondansetron (ZOFRAN ODT) 4 MG disintegrating tablet Take 1 tablet (4 mg total) by mouth every 8 (eight) hours as needed for nausea or vomiting. Qty: 20 tablet, Refills: 0    pantoprazole (PROTONIX) 40 MG tablet Take 1 tablet (40 mg total) by mouth daily. Qty: 90 tablet, Refills: 1    sotalol (BETAPACE) 80 MG tablet Take 1 tablet (80 mg total) by  mouth every 12 (twelve) hours. Qty: 60 tablet, Refills: 0    venlafaxine XR (EFFEXOR-XR) 150 MG 24 hr capsule Take 1 capsule (150 mg total) by mouth daily with lunch. Qty: 30 capsule, Refills: 0    cholestyramine (QUESTRAN) 4 GM/DOSE powder Take 1 packet by mouth 4 (four) times daily.    metoprolol (LOPRESSOR) 100 MG tablet Take 1 tablet by mouth 2 (two) times daily.      STOP taking these medications     amoxicillin-clavulanate (AUGMENTIN) 875-125 MG tablet      ciprofloxacin (CIPRO) 500 MG tablet      glimepiride (AMARYL) 2 MG tablet      metroNIDAZOLE (FLAGYL) 500 MG tablet         Today   VITAL SIGNS:  Blood pressure 121/60, pulse 74, temperature 97.6 F (36.4 C), temperature source Oral, resp. rate 20, height _0  (1.626 m), weight 68.629 kg (151 lb 4.8 oz), SpO2 97 %.  I/O:   Intake/Output Summary (Last 24 hours) at 11/29/15 1143 Last data filed at 11/29/15 [phone]  Gross per 24 hour  Intake      0 ml  Output   1850 ml  Net  -1850 ml    PHYSICAL EXAMINATION:  Physical Exam  GENERAL:  75 y.o.-year-old patient lying in the bed with no acute distress.  LUNGS: Normal breath sounds bilaterally, no wheezing, rales,rhonchi or crepitation. No use of accessory muscles of respiration.  CARDIOVASCULAR: S1, S2 normal. No murmurs, rubs, or gallops.  ABDOMEN: Soft, non-tender, non-distended. Bowel sounds present. No organomegaly or mass.  NEUROLOGIC: Moves all 4 extremities. Decreased ROM right hip PSYCHIATRIC: The patient is alert and oriented x 3.  SKIN: No obvious rash, lesion, or ulcer.   DATA REVIEW:   CBC  Recent Labs Lab 11/27/15 0600  WBC 9.2  HGB 12.1  HCT 36.3  PLT 310    Chemistries   Recent Labs Lab 11/25/15 1937  11/28/15 0431  NA 133*  < > 135  K 3.7  < > 4.0  CL 100*  < > 102  CO2 24  < > 27  GLUCOSE 201*  < > 73  BUN 15  < > 10  CREATININE 1.20*  < > 0.70  CALCIUM 8.7*  < > 8.4*  AST 37  --   --   ALT 15  --   --   ALKPHOS 74  --    --   BILITOT 0.5  --   --   < > = values in this interval not displayed.  Cardiac Enzymes No results for input(s): TROPONINI in the last 168 hours.  Microbiology Results  Results for orders placed or performed during the hospital encounter of 11/25/15  C difficile quick scan w PCR reflex     Status: None   Collection Time: 11/26/15 12:01 AM  Result Value Ref Range Status   C Diff antigen NEGATIVE NEGATIVE Final   C Diff toxin NEGATIVE NEGATIVE Final   C Diff interpretation Negative for C. difficile  Final  Rapid Influenza A&B Antigens ([place] only)     Status: None   Collection Time: 11/26/15 12:02 AM  Result Value Ref Range Status   Influenza A ([facility]) DETECTED  Final   Influenza B [facility]) NOT DETECTED  Final    RADIOLOGY:  No results found.  Follow up with PCP in 1 week.  Management plans discussed with the patient, family and they are in agreement.  CODE STATUS:     Code Status Orders        Start     Ordered   11/26/15 0002  Full code   Continuous     11/26/15 0002    Code Status History    Date Active Date Inactive Code Status Order ID Comments User Context   07/04/2015  5:17 PM 07/10/2015  8:11 PM Full Code [id number]  Marlyce Huge, MD ED   03/25/2015  7:47 PM 03/29/2015  8:44 PM Full Code [id number]  Vaughan Basta, MD Inpatient    Advance Directive Documentation        Most Recent Value   Type of Advance Directive  Healthcare Power of [place], Out of facility DNR (pink MOST or yellow form)   Pre-existing out of facility DNR order (yellow form or pink MOST form)     "MOST" Form in Place?        TOTAL TIME TAKING CARE OF THIS PATIENT ON DAY OF DISCHARGE: more than 30 minutes.   Hillary Bow R M.D on 11/29/2015 at 11:43 AM  Between 7am to  6pm - Pager - [phone]  After 6pm go to [email or web address] - password [email or web address] [facility]  Office  [phone]  CC: Primary care physician; Glendon Axe, MD  Note: This dictation was  prepared with Dragon dictation along with smaller phrase technology. Any transcriptional errors that result from this process are unintentional.

## 2015-11-29 NOTE — Progress Notes (Signed)
RN just saw CBG results. Taking patient snack and will recheck. Patient asymptomatic.

## 2015-11-29 NOTE — Progress Notes (Signed)
Clinical Social Worker was informed that patient will be medically ready to discharge to [facility]. Patient is in a agreement with plan. CSW called [facility] admissions coordinator with [facility] to confirm that patient's bed is ready. Provided patient's room number 510 and number to call for report Juliann Pulse- [phone].All discharge information faxed to [facility] via [facility].   Call to patient's son- [name] left message to inform him patient would discharge to [facility]. RN will call report and patient will discharge to [facility] via [facility].  Ernest Pine, MSW, [place] Social Work Department [phone]

## 2015-11-29 NOTE — Discharge Instructions (Signed)
DIET:  Cardiac diet and Diabetic diet  DISCHARGE CONDITION:  Stable  ACTIVITY:  Activity as tolerated  OXYGEN:  Home Oxygen: Yes.     Oxygen Delivery: 2 liters/min via Patient connected to nasal cannula oxygen  DISCHARGE LOCATION:  nursing home   If you experience worsening of your admission symptoms, develop shortness of breath, life threatening emergency, suicidal or homicidal thoughts you must seek medical attention immediately by calling 911 or calling your MD immediately  if symptoms less severe.  You Must read complete instructions/literature along with all the possible adverse reactions/side effects for all the Medicines you take and that have been prescribed to you. Take any new Medicines after you have completely understood and accpet all the possible adverse reactions/side effects.   Please note  You were cared for by a hospitalist during your hospital stay. If you have any questions about your discharge medications or the care you received while you were in the hospital after you are discharged, you can call the unit and asked to speak with the hospitalist on call if the hospitalist that took care of you is not available. Once you are discharged, your primary care physician will handle any further medical issues. Please note that NO REFILLS for any discharge medications will be authorized once you are discharged, as it is imperative that you return to your primary care physician (or establish a relationship with a primary care physician if you do not have one) for your aftercare needs so that they can reassess your need for medications and monitor your lab values.

## 2015-11-29 NOTE — Progress Notes (Signed)
Spoke with May, [facility] rep at [phone], to notify of non-emergent EMS transport.  Auth notification reference given as [id number].   Service date range good from 11/29/15 - 02/27/16.   Gap exception requested to determine if services can be considered at an in-network level.

## 2015-11-30 DIAGNOSIS — S72009A Fracture of unspecified part of neck of unspecified femur, initial encounter for closed fracture: Secondary | ICD-10-CM

## 2015-11-30 HISTORY — DX: Fracture of unspecified part of neck of unspecified femur, initial encounter for closed fracture: S72.009A

## 2015-12-01 ENCOUNTER — Ambulatory Visit: Payer: PPO | Admitting: General Surgery

## 2015-12-01 ENCOUNTER — Encounter: Payer: Self-pay | Admitting: General Surgery

## 2015-12-02 NOTE — Clinical Social Work Placement (Signed)
CLINICAL SOCIAL WORK PLACEMENT  NOTE  Date:  12/02/2015  Patient Details  Name: Julie Mcmillan MRN: [id number] Date of Birth: 17-Jul-1941  Clinical Social Work is seeking post-discharge placement for this patient at the [place] level of care (*CSW will initial, date and re-position this form in  chart as items are completed):  Yes   Patient/family provided with [place] Work Department's list of facilities offering this level of care within the geographic area requested by the patient (or if unable, by the patient's family).  Yes   Patient/family informed of their freedom to choose among providers that offer the needed level of care, that participate in Medicare, Medicaid or managed care program needed by the patient, have an available bed and are willing to accept the patient.  Yes   Patient/family informed of [facility]'s ownership interest in [facility] and [facility], as well as of the fact that they are under no obligation to receive care at these facilities.  PASRR submitted to EDS on 11/28/15     PASRR number received on 11/28/15     Existing PASRR number confirmed on       FL2 transmitted to all facilities in geographic area requested by pt/family on 11/28/15     FL2 transmitted to all facilities within larger geographic area on       Patient informed that his/her managed care company has contracts with or will negotiate with certain facilities, including the following:        Yes   Patient/family informed of bed offers received.  Patient chooses bed at  ([facility])     Physician recommends and patient chooses bed at      Patient to be transferred to  ([facility]) on  .  Patient to be transferred to facility by  [facility])     Patient family notified on 11/29/15 of transfer.  Name of family member notified:   (Son)     PHYSICIAN       Additional Comment:     _______________________________________________ Baldemar Lenis, LCSW 12/02/2015, 2:24 PM

## 2015-12-23 ENCOUNTER — Encounter: Payer: Self-pay | Admitting: *Deleted

## 2015-12-26 ENCOUNTER — Telehealth: Payer: Self-pay | Admitting: Surgery

## 2015-12-26 NOTE — Telephone Encounter (Signed)
Left voice message for patient to call and schedule appointment for diverticulitis. Notes under media

## 2016-01-02 ENCOUNTER — Ambulatory Visit: Payer: Self-pay | Admitting: General Surgery

## 2016-01-04 ENCOUNTER — Encounter: Payer: Self-pay | Admitting: Emergency Medicine

## 2016-01-04 ENCOUNTER — Emergency Department: Payer: Medicare Other

## 2016-01-04 ENCOUNTER — Emergency Department
Admission: EM | Admit: 2016-01-04 | Discharge: 2016-01-04 | Disposition: A | Discharge status: Home / Self Care | Payer: Medicare Other | Attending: Emergency Medicine | Admitting: Emergency Medicine

## 2016-01-04 DIAGNOSIS — S5291XA Unspecified fracture of right forearm, initial encounter for closed fracture: Secondary | ICD-10-CM | POA: Insufficient documentation

## 2016-01-04 DIAGNOSIS — W010XXA Fall on same level from slipping, tripping and stumbling without subsequent striking against object, initial encounter: Secondary | ICD-10-CM | POA: Diagnosis not present

## 2016-01-04 DIAGNOSIS — F39 Unspecified mood [affective] disorder: Secondary | ICD-10-CM | POA: Insufficient documentation

## 2016-01-04 DIAGNOSIS — M25531 Pain in right wrist: Secondary | ICD-10-CM | POA: Diagnosis present

## 2016-01-04 DIAGNOSIS — E119 Type 2 diabetes mellitus without complications: Secondary | ICD-10-CM | POA: Diagnosis not present

## 2016-01-04 DIAGNOSIS — I10 Essential (primary) hypertension: Secondary | ICD-10-CM | POA: Diagnosis not present

## 2016-01-04 DIAGNOSIS — E039 Hypothyroidism, unspecified: Secondary | ICD-10-CM | POA: Diagnosis not present

## 2016-01-04 DIAGNOSIS — I4891 Unspecified atrial fibrillation: Secondary | ICD-10-CM | POA: Diagnosis not present

## 2016-01-04 DIAGNOSIS — K219 Gastro-esophageal reflux disease without esophagitis: Secondary | ICD-10-CM | POA: Diagnosis not present

## 2016-01-04 DIAGNOSIS — Z79899 Other long term (current) drug therapy: Secondary | ICD-10-CM | POA: Diagnosis not present

## 2016-01-04 DIAGNOSIS — Z7984 Long term (current) use of oral hypoglycemic drugs: Secondary | ICD-10-CM | POA: Insufficient documentation

## 2016-01-04 DIAGNOSIS — S52611A Displaced fracture of right ulna styloid process, initial encounter for closed fracture: Secondary | ICD-10-CM | POA: Insufficient documentation

## 2016-01-04 DIAGNOSIS — Y999 Unspecified external cause status: Secondary | ICD-10-CM | POA: Diagnosis not present

## 2016-01-04 DIAGNOSIS — J849 Interstitial pulmonary disease, unspecified: Secondary | ICD-10-CM | POA: Diagnosis not present

## 2016-01-04 DIAGNOSIS — E785 Hyperlipidemia, unspecified: Secondary | ICD-10-CM | POA: Insufficient documentation

## 2016-01-04 DIAGNOSIS — I480 Paroxysmal atrial fibrillation: Secondary | ICD-10-CM | POA: Insufficient documentation

## 2016-01-04 DIAGNOSIS — M810 Age-related osteoporosis without current pathological fracture: Secondary | ICD-10-CM | POA: Insufficient documentation

## 2016-01-04 DIAGNOSIS — Y92410 Unspecified street and highway as the place of occurrence of the external cause: Secondary | ICD-10-CM | POA: Diagnosis not present

## 2016-01-04 DIAGNOSIS — Y9301 Activity, walking, marching and hiking: Secondary | ICD-10-CM | POA: Diagnosis not present

## 2016-01-04 DIAGNOSIS — G47419 Narcolepsy without cataplexy: Secondary | ICD-10-CM | POA: Diagnosis not present

## 2016-01-04 MED ORDER — OXYCODONE-ACETAMINOPHEN 5-325 MG PO TABS
ORAL_TABLET | ORAL | Status: AC
  Administered 2016-01-04: 1 via ORAL
  Filled 2016-01-04: qty 1

## 2016-01-04 MED ORDER — OXYCODONE-ACETAMINOPHEN 5-325 MG PO TABS: 1.0000 | ORAL_TABLET | Freq: Four times a day (QID) | ORAL | Status: DC | PRN

## 2016-01-04 MED ORDER — OXYCODONE-ACETAMINOPHEN 5-325 MG PO TABS
1.0000 | ORAL_TABLET | ORAL | Status: AC | PRN
  Administered 2016-01-04 (×2): 1 via ORAL
  Filled 2016-01-04: qty 1

## 2016-01-04 NOTE — ED Notes (Signed)
Patient presents to the ED with deformed and swollen right hand and wrist post fall at 4pm.  Patient states she was walking down the street after it had just began to rain and she fell and hurt her wrist.  Patient has a small abrasion to her right cheek.  Patient denies passing out.  Patient denies dizziness or vomiting.  Patient reports pain only to hand and wrist.

## 2016-01-04 NOTE — ED Provider Notes (Signed)
[facility] Emergency Department Provider Note  ____________________________________________  Time seen: Approximately 6:56 PM  I have reviewed the triage vital signs and the nursing notes.   HISTORY  Chief Complaint Hand Pain    HPI Julie Mcmillan is a 75 y.o. female presents to the emergency department complaining of right wrist pain. Patient states that she slipped and fell on her outstretched wrist prior to arrival. Patient denies hitting her head or lose consciousness. Patient states that she is having pain to the distal radius ulna region. There is swelling and ecchymosis. She denies any numbness or tingling in digits.   Past Medical History  Diagnosis Date  . A-fib ([place])   . Hypertension   . IBS (irritable bowel syndrome)   . Shortness of breath dyspnea   . GERD (gastroesophageal reflux disease)   . Interstitial lung disease ([place])   . Seasonal affective disorder ([place])   . Type 2 diabetes mellitus ([place]) 11/04/2014  . IBS (irritable bowel syndrome)   . Diverticulitis   . Chronic diarrhea     Patient Active Problem List   Diagnosis Date Noted  . Acute bronchitis 11/29/2015  . Acute respiratory failure with hypoxia ([place]) 11/29/2015  . Fracture of greater trochanter of right femur ([place]) 11/29/2015  . Fall 11/26/2015  . OP (osteoporosis) 07/18/2015  . 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 Adult hypothyroidism 07/18/2015  . BP (high blood pressure) 07/18/2015  . Fatigue 07/18/2015  . Anxiety 07/18/2015  . Diverticulitis large intestine 07/04/2015  . Paroxysmal atrial fibrillation ([place]) 04/22/2015  . Clinical depression 04/22/2015  . Atrial fibrillation with rapid ventricular response ([place]) 03/25/2015  . Atrial fibrillation ([place]) 03/25/2015  . Acquired hypothyroidism 01/11/2015  . Anemia, iron deficiency 01/10/2015  . Benign essential HTN 01/10/2015  . Type 2 diabetes mellitus ([place]) 11/04/2014  . 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 Essential (primary) hypertension 11/04/2014  . Temporary cerebral vascular dysfunction 04/08/2014  . Dry mouth 01/05/2013  . Cannot sleep 01/05/2013  . Obstructive apnea 09/02/2012  . Bursitis, ischial 05/19/2012  . Acid reflux 02/01/2012  . DD (diverticular disease) 08/14/2011    Past Surgical History  Procedure Laterality Date  . Nissen fundoplication    . Abdominal hysterectomy      partial  . Appendectomy    . Tonsillectomy      and adenoidectomy  . Colonoscopy    . Cataract extraction w/phaco Right 03/16/2015    Procedure: CATARACT EXTRACTION PHACO AND INTRAOCULAR LENS PLACEMENT (IOC);  Surgeon: Leandrew Koyanagi, MD;  Location: [facility];  Service: Ophthalmology;  Laterality: Right;    Current Outpatient Rx  Name  Route  Sig  Dispense  Refill  . amoxicillin-clavulanate (AUGMENTIN) 875-125 MG tablet   Oral   Take 1 tablet by mouth 2 (two) times daily.         Marland Kitchen apixaban (ELIQUIS) 5 MG TABS tablet   Oral   Take 5 mg by mouth 2 (two) times daily.         . Calcium Carbonate-Vitamin D (CALCIUM 600+D) 600-200 MG-UNIT TABS   Oral   Take 1 tablet by mouth daily.         . cholestyramine (QUESTRAN) 4 GM/DOSE powder   Oral   Take 1 packet by mouth 4 (four) times daily.         Marland Kitchen dicyclomine (BENTYL) 20 MG tablet   Oral   Take 1 tablet (20 mg total)  by mouth 3 (three) times daily as needed for spasms.   30 tablet   0   . diltiazem (CARDIZEM CD) 120 MG 24 hr capsule   Oral   Take 1 capsule (120 mg total) by mouth daily.   30 capsule   0   . glimepiride (AMARYL) 2 MG tablet   Oral   Take 1 tablet by mouth 1 day or 1 dose.         . hydrALAZINE (APRESOLINE) 25 MG tablet   Oral   Take 25 mg by mouth 3 (three) times daily.         Marland Kitchen ipratropium-albuterol (DUONEB) 0.5-2.5 (3) MG/3ML SOLN   Nebulization   Take 3 mLs by nebulization every 4 (four) hours as needed.   360 mL      .  levofloxacin (LEVAQUIN) 750 MG tablet   Oral   Take 1 tablet (750 mg total) by mouth daily.   4 tablet   0   . levothyroxine (SYNTHROID, LEVOTHROID) 50 MCG tablet   Oral   Take 50 mcg by mouth daily before breakfast.         . losartan (COZAAR) 100 MG tablet   Oral   Take 100 mg by mouth daily.          . metFORMIN (GLUCOPHAGE) 500 MG tablet   Oral   Take 1 tablet (500 mg total) by mouth 2 (two) times daily with a meal.   30 tablet   0   . metoprolol (LOPRESSOR) 100 MG tablet   Oral   Take 1 tablet by mouth 2 (two) times daily.         . metroNIDAZOLE (FLAGYL) 500 MG tablet   Oral   Take 1 tablet by mouth 1 day or 1 dose.         . Multiple Vitamin (MULTIVITAMIN WITH MINERALS) TABS tablet   Oral   Take 1 tablet by mouth daily.         . ondansetron (ZOFRAN ODT) 4 MG disintegrating tablet   Oral   Take 1 tablet (4 mg total) by mouth every 8 (eight) hours as needed for nausea or vomiting.   20 tablet   0   . oxyCODONE-acetaminophen (ROXICET) 5-325 MG tablet   Oral   Take 1 tablet by mouth every 6 (six) hours as needed for severe pain.   20 tablet   0   . pantoprazole (PROTONIX) 40 MG tablet   Oral   Take 1 tablet (40 mg total) by mouth daily.   90 tablet   1   . predniSONE (DELTASONE) 20 MG tablet   Oral   Take 2 tablets (40 mg total) by mouth daily with breakfast.   4 tablet   0   . sotalol (BETAPACE) 80 MG tablet   Oral   Take 1 tablet (80 mg total) by mouth every 12 (twelve) hours.   60 tablet   0   . venlafaxine XR (EFFEXOR-XR) 150 MG 24 hr capsule   Oral   Take 1 capsule (150 mg total) by mouth daily with lunch.   30 capsule   0     Allergies Amlodipine and Nexium  Family History  Problem Relation Age of Onset  . Breast cancer Mother   . Diabetes Mellitus II Mother   . Hypothyroidism Mother   . Atrial fibrillation Mother   . Heart attack Father     Social History Social History  Substance Use Topics  .  Smoking status:  Never Smoker   . Smokeless tobacco: None  . Alcohol Use: No     Review of Systems  Constitutional: No fever/chills Cardiovascular: no chest pain. Respiratory: no cough. No SOB. Gastrointestinal:   No nausea, no vomiting.   Musculoskeletal: Positive for right wrist pain. Skin: Negative for rash. Neurological: Negative for headaches, focal weakness or numbness. 10-point ROS otherwise negative.  ____________________________________________   PHYSICAL EXAM:  VITAL SIGNS: ED Triage Vitals  Enc Vitals Group     BP 01/04/16 1745 143/71 mmHg     Pulse Rate 01/04/16 1745 68     Resp 01/04/16 1745 18     Temp 01/04/16 1745 98.2 F (36.8 C)     Temp Source 01/04/16 1745 Oral     SpO2 01/04/16 1745 98 %     Weight 01/04/16 1745 145 lb (65.772 kg)     Height 01/04/16 1745 _0  (1.626 m)     Head Cir --      Peak Flow --      Pain Score 01/04/16 1746 10     Pain Loc --      Pain Edu? --      Excl. in [place]? --      Constitutional: Alert and oriented. Well appearing and in no acute distress. Eyes: Conjunctivae are normal. PERRL. EOMI. Head: Atraumatic. Cardiovascular: Normal rate, regular rhythm. Normal S1 and S2.  Good peripheral circulation. Respiratory: Normal respiratory effort without tachypnea or retractions. Lungs CTAB. Musculoskeletal: Deformity noted to distal radius and ulna. Surrounding edema. Ecchymosis noted to the dorsal aspect of the hand. Patient is very tender to palpation over the distal radius and ulna. No palpable abnormality. Radial pulses appreciated. Sensation and cap refill intact distally. Patient is mildly tender to palpation over the carpal bones. No palpable abnormality. Neurologic:  Normal speech and language. No gross focal neurologic deficits are appreciated.  Skin:  Skin is warm, dry and intact. No rash noted. Psychiatric: Mood and affect are normal. Speech and behavior are normal. Patient exhibits appropriate insight and  judgement.   ____________________________________________   LABS (all labs ordered are listed, but only abnormal results are displayed)  Labs Reviewed - No data to display ____________________________________________  EKG   ____________________________________________  RADIOLOGY Diamantina [place] Cannie Muckle, personally viewed and evaluated these images (plain radiographs) as part of my medical decision making, as well as reviewing the written report by the radiologist.  Dg Hand Complete Right  01/04/2016  CLINICAL DATA:  Right hand swelling and pain after fall. Initial encounter. EXAM: RIGHT HAND - COMPLETE 3+ VIEW COMPARISON:  None. FINDINGS: Comminuted fracture of the distal radius shows impaction and displacement. There is an associated avulsion of the ulnar styloid process. The hand itself shows no visible fracture. 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 IMPRESSION: Comminuted fracture of the distal right radius and avulsion of the ulnar styloid. Dedicated wrist films may be helpful. Electronically Signed   By: Aletta Edouard M.D.   On: 01/04/2016 18:18    ____________________________________________    PROCEDURES  Procedure(s) performed:    SPLINT APPLICATION Date/Time: 2:29 PM Authorized by: Darletta Moll Consent: Verbal consent obtained. Risks and benefits: risks, benefits and alternatives were discussed Consent given by: patient Splint applied by: ED tech  Location details: Right wrist  Splint type: Volar wrist splint  Supplies used: Ortho-Glass  Post-procedure: The splinted body part was neurovascularly unchanged following the procedure. Patient tolerance: Patient tolerated the procedure well with no immediate complications.  Medications  oxyCODONE-acetaminophen (PERCOCET/ROXICET) 5-325 MG per tablet 1 tablet (1 tablet Oral Given 01/04/16 1931)     ____________________________________________   INITIAL IMPRESSION / ASSESSMENT AND PLAN /  ED COURSE  Pertinent labs & imaging results that were available during my care of the patient were reviewed by me and considered in my medical decision making (see chart for details).  Patient's diagnosis is consistent with Comminuted fracture of the right radius with ulnar styloid fracture. Patient is splinted here in the emergency department. The on-call orthopedic surgeon is consulted and he advised that he will follow-up in 3 days for further evaluation and treatment.. Patient will be discharged home with prescriptions for pain medication.Patient is given ED precautions to return to the ED for any worsening or new symptoms.     ____________________________________________  FINAL CLINICAL IMPRESSION(S) / ED DIAGNOSES  Final diagnoses:  Radius fracture, right, closed, initial encounter  Ulna styloid fracture, closed, right, initial encounter      NEW MEDICATIONS STARTED DURING THIS VISIT:  Discharge Medication List as of 01/04/2016  7:25 PM          This chart was dictated using voice recognition software/Dragon. Despite best efforts to proofread, errors can occur which can change the meaning. Any change was purely unintentional.    Darletta Moll, PA-C 01/04/16 1937  Orbie Pyo, MD 01/04/16 2322

## 2016-01-04 NOTE — Discharge Instructions (Signed)
Cast or Splint Care Casts and splints support injured limbs and keep bones from moving while they heal. It is important to care for your cast or splint at home.  HOME CARE INSTRUCTIONS  Keep the cast or splint uncovered during the drying period. It can take 24 to 48 hours to dry if it is made of plaster. A fiberglass cast will dry in less than 1 hour.  Do not rest the cast on anything harder than a pillow for the first 24 hours.  Do not put weight on your injured limb or apply pressure to the cast until your health care provider gives you permission.  Keep the cast or splint dry. Wet casts or splints can lose their shape and may not support the limb as well. A wet cast that has lost its shape can also create harmful pressure on your skin when it dries. Also, wet skin can become infected.  Cover the cast or splint with a plastic bag when bathing or when out in the rain or snow. If the cast is on the trunk of the body, take sponge baths until the cast is removed.  If your cast does become wet, dry it with a towel or a blow dryer on the cool setting only.  Keep your cast or splint clean. Soiled casts may be wiped with a moistened cloth.  Do not place any hard or soft foreign objects under your cast or splint, such as cotton, toilet paper, lotion, or powder.  Do not try to scratch the skin under the cast with any object. The object could get stuck inside the cast. Also, scratching could lead to an infection. If itching is a problem, use a blow dryer on a cool setting to relieve discomfort.  Do not trim or cut your cast or remove padding from inside of it.  Exercise all joints next to the injury that are not immobilized by the cast or splint. For example, if you have a long leg cast, exercise the hip joint and toes. If you have an arm cast or splint, exercise the shoulder, elbow, thumb, and fingers.  Elevate your injured arm or leg on 1 or 2 pillows for the first 1 to 3 days to decrease  swelling and pain.It is best if you can comfortably elevate your cast so it is higher than your heart. SEEK MEDICAL CARE IF:   Your cast or splint cracks.  Your cast or splint is too tight or too loose.  You have unbearable itching inside the cast.  Your cast becomes wet or develops a soft spot or area.  You have a bad smell coming from inside your cast.  You get an object stuck under your cast.  Your skin around the cast becomes red or raw.  You have new pain or worsening pain after the cast has been applied. SEEK IMMEDIATE MEDICAL CARE IF:   You have fluid leaking through the cast.  You are unable to move your fingers or toes.  You have discolored (blue or white), cool, painful, or very swollen fingers or toes beyond the cast.  You have tingling or numbness around the injured area.  You have severe pain or pressure under the cast.  You have any difficulty with your breathing or have shortness of breath.  You have chest pain.   This information is not intended to replace advice given to you by your health care provider. Make sure you discuss any questions you have with your health care  provider.   Document Released: 09/14/2000 Document Revised: 07/08/2013 Document Reviewed: 03/26/2013 Elsevier Interactive Patient Education 2016 Elsevier Inc[employer]  Forearm Fracture A forearm fracture is a break in one or both of the bones of your arm that are between the elbow and the wrist. Your forearm is made up of two bones:  Radius. This is the bone on the inside of your arm near your thumb.  Ulna. This is the bone on the outside of your arm near your little finger. Middle forearm fractures usually break both the radius and the ulna. Most forearm fractures that involve both the ulna and radius will require surgery. CAUSES Common causes of this type of fracture include:  Falling on an outstretched arm.  Accidents, such as a car or bike accident.  A hard, direct hit to the middle  part of your arm. RISK FACTORS You may be at higher risk for this type of fracture if:  You play contact sports.  You have a condition that causes your bones to be weak or thin (osteoporosis). SIGNS AND SYMPTOMS A forearm fracture causes pain immediately after the injury. Other signs and symptoms include:  An abnormal bend or bump in your arm (deformity).  Swelling.  Numbness or tingling.  Tenderness.  Inability to turn your hand from side to side (rotate).  Bruising. DIAGNOSIS Your health care provider may diagnose a forearm fracture based on:  Your symptoms.  Your medical history, including any recent injury.  A physical exam. Your health care provider will look for any deformity and feel for tenderness over the break. Your health care provider will also check whether the bones are out of place.  An X-ray exam to confirm the diagnosis and learn more about the type of fracture. TREATMENT The goals of treatment are to get the bone or bones in proper position for healing and to keep the bones from moving so they will heal over time. Your treatment will depend on many factors, especially the type of fracture that you have.  If the fractured bone or bones:  Are in the correct position (nondisplaced), you may only need to wear a cast or a splint.  Have a slightly displaced fracture, you may need to have the bones moved back into place manually (closed reduction) before the splint or cast is put on.  You may have a temporary splint before you have a cast. The splint allows room for some swelling. After a few days, a cast can replace the splint.  You may have to wear the cast for 6-8 weeks or as directed by your health care provider.  The cast may be changed after about 3 weeks or as directed by your health care provider.  After your cast is removed, you may need physical therapy to regain full movement in your wrist or elbow.  You may need emergency surgery if you  have:  A fractured bone or bones that are out of position (displaced).  A fracture with multiple fragments (comminuted fracture).  A fracture that breaks the skin (open fracture). This type of fracture may require surgical wires, plates, or screws to hold the bone or bones in place.  You may have X-rays every couple of weeks to check on your healing. HOME CARE INSTRUCTIONS If You Have a Cast:  Do not stick anything inside the cast to scratch your skin. Doing that increases your risk of infection.  Check the skin around the cast every day. Report any concerns to your health care  provider. You may put lotion on dry skin around the edges of the cast. Do not apply lotion to the skin underneath the cast. If You Have a Splint:  Wear it as directed by your health care provider. Remove it only as directed by your health care provider.  Loosen the splint if your fingers become numb and tingle, or if they turn cold and blue. Bathing  Cover the cast or splint with a watertight plastic bag to protect it from water while you bathe or shower. Do not let the cast or splint get wet. Managing Pain, Stiffness, and Swelling  If directed, apply ice to the injured area:  Put ice in a plastic bag.  Place a towel between your skin and the bag.  Leave the ice on for 20 minutes, 2-3 times a day.  Move your fingers often to avoid stiffness and to lessen swelling.  Raise the injured area above the level of your heart while you are sitting or lying down. Driving  Do not drive or operate heavy machinery while taking pain medicine.  Do not drive while wearing a cast or splint on a hand that you use for driving. Activity  Return to your normal activities as directed by your health care provider. Ask your health care provider what activities are safe for you.  Perform range-of-motion exercises only as directed by your health care provider. Safety  Do not use your injured limb to support your body  weight until your health care provider says that you can. General Instructions  Do not put pressure on any part of the cast or splint until it is fully hardened. This may take several hours.  Keep the cast or splint clean and dry.  Do not use any tobacco products, including cigarettes, chewing tobacco, or electronic cigarettes. Tobacco can delay bone healing. If you need help quitting, ask your health care provider.  Take medicines only as directed by your health care provider.  Keep all follow-up visits as directed by your health care provider. This is important. SEEK MEDICAL CARE IF:  Your pain medicine is not helping.  Your cast or splint becomes wet or damaged or suddenly feels too tight.  Your cast becomes loose.  You have more severe pain or swelling than you did before the cast.  You have severe pain when you stretch your fingers.  You continue to have pain or stiffness in your elbow or your wrist after your cast is removed. SEEK IMMEDIATE MEDICAL CARE IF:  You cannot move your fingers.  You lose feeling in your fingers or your hand.  Your hand or your fingers turn cold and pale or blue.  You notice a bad smell coming from your cast.  You have drainage from underneath your cast.  You have new stains from blood or drainage that is coming through your cast.   This information is not intended to replace advice given to you by your health care provider. Make sure you discuss any questions you have with your health care provider.   Document Released: 09/14/2000 Document Revised: 10/08/2014 Document Reviewed: 05/03/2014 Elsevier Interactive Patient Education [employer].

## 2016-01-10 ENCOUNTER — Encounter: Payer: Self-pay | Admitting: *Deleted

## 2016-01-10 ENCOUNTER — Encounter
Admission: RE | Disposition: A | Discharge status: Home / Self Care | Payer: Self-pay | Source: Ambulatory Visit | Attending: Orthopedic Surgery

## 2016-01-10 ENCOUNTER — Ambulatory Visit
Admission: RE | Admit: 2016-01-10 | Discharge: 2016-01-10 | Disposition: A | Discharge status: Home / Self Care | Payer: Medicare Other | Source: Ambulatory Visit | Attending: Orthopedic Surgery | Admitting: Orthopedic Surgery

## 2016-01-10 ENCOUNTER — Ambulatory Visit: Payer: Medicare Other | Admitting: Anesthesiology

## 2016-01-10 DIAGNOSIS — S52501A Unspecified fracture of the lower end of right radius, initial encounter for closed fracture: Secondary | ICD-10-CM | POA: Diagnosis present

## 2016-01-10 DIAGNOSIS — Z539 Procedure and treatment not carried out, unspecified reason: Secondary | ICD-10-CM | POA: Diagnosis not present

## 2016-01-10 DIAGNOSIS — W19XXXA Unspecified fall, initial encounter: Secondary | ICD-10-CM | POA: Diagnosis not present

## 2016-01-10 DIAGNOSIS — Y939 Activity, unspecified: Secondary | ICD-10-CM | POA: Insufficient documentation

## 2016-01-10 HISTORY — DX: Fracture of unspecified part of neck of unspecified femur, initial encounter for closed fracture: S72.009A

## 2016-01-10 LAB — GLUCOSE, CAPILLARY: Glucose-Capillary: 86 mg/dL (ref 65–99)

## 2016-01-10 SURGERY — OPEN REDUCTION INTERNAL FIXATION (ORIF) DISTAL RADIUS FRACTURE
Anesthesia: Choice | Laterality: Right

## 2016-01-10 MED ORDER — FAMOTIDINE 20 MG PO TABS
ORAL_TABLET | ORAL | Status: AC
  Administered 2016-01-10: 20 mg
  Filled 2016-01-10: qty 1

## 2016-01-10 MED ORDER — NEOMYCIN-POLYMYXIN B GU 40-200000 IR SOLN
Status: AC
  Filled 2016-01-10: qty 2

## 2016-01-10 MED ORDER — SODIUM CHLORIDE 0.9 % IV SOLN
INTRAVENOUS | Status: DC
  Administered 2016-01-10: 75 mL/h via INTRAVENOUS

## 2016-01-10 SURGICAL SUPPLY — 28 items
BANDAGE ACE 4X5 VEL STRL LF (GAUZE/BANDAGES/DRESSINGS) ×3 IMPLANT
CANISTER SUCT 1200ML W/VALVE (MISCELLANEOUS) ×3 IMPLANT
CHLORAPREP W/TINT 26ML (MISCELLANEOUS) ×3 IMPLANT
DRAPE FLUOR MINI C-ARM 54X84 (DRAPES) ×3 IMPLANT
ELECT REM PT RETURN 9FT ADLT (ELECTROSURGICAL) ×3
ELECTRODE REM PT RTRN 9FT ADLT (ELECTROSURGICAL) ×1 IMPLANT
GAUZE PETRO XEROFOAM 1X8 (MISCELLANEOUS) ×6 IMPLANT
GAUZE SPONGE 4X4 12PLY STRL (GAUZE/BANDAGES/DRESSINGS) ×3 IMPLANT
GLOVE BIOGEL PI IND STRL 9 (GLOVE) ×1 IMPLANT
GLOVE BIOGEL PI INDICATOR 9 (GLOVE) ×2
GLOVE SURG ORTHO 9.0 STRL STRW (GLOVE) ×3 IMPLANT
GOWN SPECIALTY ULTRA XL (MISCELLANEOUS) ×3 IMPLANT
GOWN STRL REUS W/ TWL LRG LVL3 (GOWN DISPOSABLE) ×1 IMPLANT
GOWN STRL REUS W/TWL LRG LVL3 (GOWN DISPOSABLE) ×2
KIT RM TURNOVER STRD PROC AR (KITS) ×3 IMPLANT
NEEDLE FILTER BLUNT 18X 1/2SAF (NEEDLE) ×2
NEEDLE FILTER BLUNT 18X1 1/2 (NEEDLE) ×1 IMPLANT
NS IRRIG 500ML POUR BTL (IV SOLUTION) ×3 IMPLANT
PACK EXTREMITY ARMC (MISCELLANEOUS) IMPLANT
PAD CAST CTTN 4X4 STRL (SOFTGOODS) ×2 IMPLANT
PADDING CAST COTTON 4X4 STRL (SOFTGOODS) ×4
SPLINT CAST 1 STEP 3X12 (MISCELLANEOUS) ×3 IMPLANT
STOCKINETTE STRL 4IN 9604848 (GAUZE/BANDAGES/DRESSINGS) ×3 IMPLANT
SUT ETHILON 4-0 (SUTURE)
SUT ETHILON 4-0 FS2 18XMFL BLK (SUTURE)
SUT VICRYL 3-0 27IN (SUTURE) IMPLANT
SUTURE ETHLN 4-0 FS2 18XMF BLK (SUTURE) IMPLANT
SYR 3ML LL SCALE MARK (SYRINGE) ×3 IMPLANT

## 2016-01-10 NOTE — OR Nursing (Signed)
We have identified some transportation issues.  Granddaughter is unable to stay until discharge or overnight. Her son is not able to accomodate transportation either.  Also, notified Dr. Rudene Christians that patient took Eliquis and Aleve up until yesterday.  Dr. Kayleen Memos was okay with this but wanted Dr. Rudene Christians to decide.  Dr. Rudene Christians said that surgery needs to proceed as soon as possible. [facility] had instructed patient that she could go home with a cab.  Patient thought that this was acceptable means of transportation.

## 2016-01-10 NOTE — OR Nursing (Signed)
Bruising noted under right eye, right shoulder, right hip.

## 2016-01-10 NOTE — OR Nursing (Signed)
Dr. Rudene Christians in to see patient and to discuss cancelling case today and rebooking for Friday.  He would like her to stay off her Eliquis until that time, along with Aleve. List of meds given to patient to hold/take on Friday am.  Patient has confirmed transportation for Friday.Iv d/c'd intact. Slight bruising noted at insertion site.  Otherwise, clean dry and intact.  Patient left ambulatory and accompanied by granddaughter.

## 2016-01-10 NOTE — Anesthesia Preprocedure Evaluation (Addendum)
Anesthesia Evaluation  Patient identified by MRN, date of birth, ID band Patient awake    Reviewed: Allergy & Precautions, NPO status , Patient's Chart, lab work & pertinent test results  Airway Mallampati: III  TM Distance: <3 FB   Mouth opening: Limited Mouth Opening  Dental  (+) Chipped, Caps   Pulmonary shortness of breath and with exertion, sleep apnea , pneumonia, resolved,  Hx of bronchitis in the past   Pulmonary exam normal breath sounds clear to auscultation       Cardiovascular hypertension, Pt. on medications Normal cardiovascular exam+ dysrhythmias Atrial Fibrillation   EF 55%   Neuro/Psych Anxiety Depression Hx of temporary cerebral dysfunction    GI/Hepatic Neg liver ROS, GERD  Medicated and Controlled,IBS   Endo/Other  diabetes, Well Controlled, Type 2, Oral Hypoglycemic AgentsHypothyroidism   Renal/GU negative Renal ROS  negative genitourinary   Musculoskeletal Fx   Abdominal Normal abdominal exam  (+)   Peds negative pediatric ROS (+)  Hematology  (+) anemia ,   Anesthesia Other Findings   Reproductive/Obstetrics negative OB ROS                            Anesthesia Physical Anesthesia Plan  ASA: III  Anesthesia Plan: General   Post-op Pain Management:    Induction: Intravenous  Airway Management Planned: LMA  Additional Equipment:   Intra-op Plan:   Post-operative Plan: Extubation in OR  Informed Consent: I have reviewed the patients History and Physical, chart, labs and discussed the procedure including the risks, benefits and alternatives for the proposed anesthesia with the patient or authorized representative who has indicated his/her understanding and acceptance.   Dental advisory given  Plan Discussed with: CRNA and Surgeon  Anesthesia Plan Comments:                                          Anesthesia Evaluation  Patient identified by MRN,  date of birth, ID band Patient awake    Reviewed: Allergy & Precautions, NPO status , Patient's Chart, lab work & pertinent test results  Airway Mallampati: III  TM Distance: >3 FB Neck ROM: Full    Dental no notable dental hx.    Pulmonary neg pulmonary ROS,  Interstitial lung disease breath sounds clear to auscultation  Pulmonary exam normal       Cardiovascular METS: 3 - Mets hypertension, negative cardio ROS Normal cardiovascular examRhythm:Regular Rate:Normal  H/o arrhythmia.  No current symptoms.  Pt can lie flat   Neuro/Psych negative neurological ROS  negative psych ROS   GI/Hepatic negative GI ROS, Neg liver ROS, GERD-  ,  Endo/Other  negative endocrine ROS  Renal/GU negative Renal ROS  negative genitourinary   Musculoskeletal negative musculoskeletal ROS (+)   Abdominal   Peds negative pediatric ROS (+)  Hematology negative hematology ROS (+)   Anesthesia Other Findings   Reproductive/Obstetrics negative OB ROS                             Anesthesia Physical Anesthesia Plan  ASA: II  Anesthesia Plan: MAC   Post-op Pain Management:    Induction: Intravenous  Airway Management Planned:   Additional Equipment:   Intra-op Plan:   Post-operative Plan: Extubation in OR  Informed Consent: I have reviewed the patients History  and Physical, chart, labs and discussed the procedure including the risks, benefits and alternatives for the proposed anesthesia with the patient or authorized representative who has indicated his/her understanding and acceptance.   Dental advisory given  Plan Discussed with: CRNA  Anesthesia Plan Comments:         Anesthesia Quick Evaluation  Anesthesia Quick Evaluation

## 2016-01-10 NOTE — H&P (Signed)
Reviewed paper H+P, will be scanned into chart. No changes noted.  Does not have anyone to stay with her tonight, will be rescheduled for Friday am.

## 2016-01-11 ENCOUNTER — Ambulatory Visit: Payer: Self-pay | Admitting: General Surgery

## 2016-01-13 ENCOUNTER — Ambulatory Visit: Payer: Medicare Other | Admitting: Anesthesiology

## 2016-01-13 ENCOUNTER — Encounter
Admission: RE | Disposition: A | Discharge status: Home / Self Care | Payer: Self-pay | Source: Ambulatory Visit | Attending: Orthopedic Surgery

## 2016-01-13 ENCOUNTER — Ambulatory Visit
Admission: RE | Admit: 2016-01-13 | Discharge: 2016-01-13 | Disposition: A | Discharge status: Home / Self Care | Payer: Medicare Other | Source: Ambulatory Visit | Attending: Orthopedic Surgery | Admitting: Orthopedic Surgery

## 2016-01-13 ENCOUNTER — Ambulatory Visit: Payer: Medicare Other

## 2016-01-13 ENCOUNTER — Encounter: Payer: Self-pay | Admitting: *Deleted

## 2016-01-13 DIAGNOSIS — I10 Essential (primary) hypertension: Secondary | ICD-10-CM | POA: Diagnosis not present

## 2016-01-13 DIAGNOSIS — Z833 Family history of diabetes mellitus: Secondary | ICD-10-CM | POA: Insufficient documentation

## 2016-01-13 DIAGNOSIS — E039 Hypothyroidism, unspecified: Secondary | ICD-10-CM | POA: Diagnosis not present

## 2016-01-13 DIAGNOSIS — Y939 Activity, unspecified: Secondary | ICD-10-CM | POA: Insufficient documentation

## 2016-01-13 DIAGNOSIS — I4891 Unspecified atrial fibrillation: Secondary | ICD-10-CM | POA: Diagnosis not present

## 2016-01-13 DIAGNOSIS — M810 Age-related osteoporosis without current pathological fracture: Secondary | ICD-10-CM | POA: Diagnosis not present

## 2016-01-13 DIAGNOSIS — R7302 Impaired glucose tolerance (oral): Secondary | ICD-10-CM | POA: Diagnosis not present

## 2016-01-13 DIAGNOSIS — Z8249 Family history of ischemic heart disease and other diseases of the circulatory system: Secondary | ICD-10-CM | POA: Diagnosis not present

## 2016-01-13 DIAGNOSIS — G4730 Sleep apnea, unspecified: Secondary | ICD-10-CM | POA: Diagnosis not present

## 2016-01-13 DIAGNOSIS — F329 Major depressive disorder, single episode, unspecified: Secondary | ICD-10-CM | POA: Diagnosis not present

## 2016-01-13 DIAGNOSIS — W19XXXA Unspecified fall, initial encounter: Secondary | ICD-10-CM | POA: Insufficient documentation

## 2016-01-13 DIAGNOSIS — F419 Anxiety disorder, unspecified: Secondary | ICD-10-CM | POA: Insufficient documentation

## 2016-01-13 DIAGNOSIS — K219 Gastro-esophageal reflux disease without esophagitis: Secondary | ICD-10-CM | POA: Diagnosis not present

## 2016-01-13 DIAGNOSIS — Z8781 Personal history of (healed) traumatic fracture: Secondary | ICD-10-CM

## 2016-01-13 DIAGNOSIS — Z9889 Other specified postprocedural states: Secondary | ICD-10-CM

## 2016-01-13 DIAGNOSIS — G47419 Narcolepsy without cataplexy: Secondary | ICD-10-CM | POA: Diagnosis not present

## 2016-01-13 DIAGNOSIS — E119 Type 2 diabetes mellitus without complications: Secondary | ICD-10-CM | POA: Insufficient documentation

## 2016-01-13 DIAGNOSIS — D649 Anemia, unspecified: Secondary | ICD-10-CM | POA: Insufficient documentation

## 2016-01-13 DIAGNOSIS — Z79899 Other long term (current) drug therapy: Secondary | ICD-10-CM | POA: Insufficient documentation

## 2016-01-13 DIAGNOSIS — Z803 Family history of malignant neoplasm of breast: Secondary | ICD-10-CM | POA: Diagnosis not present

## 2016-01-13 DIAGNOSIS — S52501A Unspecified fracture of the lower end of right radius, initial encounter for closed fracture: Secondary | ICD-10-CM | POA: Insufficient documentation

## 2016-01-13 DIAGNOSIS — J988 Other specified respiratory disorders: Secondary | ICD-10-CM | POA: Diagnosis not present

## 2016-01-13 DIAGNOSIS — Z7984 Long term (current) use of oral hypoglycemic drugs: Secondary | ICD-10-CM | POA: Diagnosis not present

## 2016-01-13 DIAGNOSIS — R0602 Shortness of breath: Secondary | ICD-10-CM | POA: Insufficient documentation

## 2016-01-13 DIAGNOSIS — Z888 Allergy status to other drugs, medicaments and biological substances status: Secondary | ICD-10-CM | POA: Insufficient documentation

## 2016-01-13 HISTORY — PX: OPEN REDUCTION INTERNAL FIXATION (ORIF) DISTAL RADIAL FRACTURE: SHX5989

## 2016-01-13 LAB — GLUCOSE, CAPILLARY
Glucose-Capillary: 131 mg/dL — ABNORMAL HIGH (ref 65–99)
Glucose-Capillary: 106 mg/dL — ABNORMAL HIGH (ref 65–99)

## 2016-01-13 SURGERY — OPEN REDUCTION INTERNAL FIXATION (ORIF) DISTAL RADIUS FRACTURE
Anesthesia: General | Laterality: Right | Wound class: Clean

## 2016-01-13 MED ORDER — GLYCOPYRROLATE 0.2 MG/ML IJ SOLN
INTRAMUSCULAR | Status: DC | PRN
  Administered 2016-01-13: 0.2 mg via INTRAVENOUS

## 2016-01-13 MED ORDER — ONDANSETRON HCL 4 MG/2ML IJ SOLN: 4.0000 mg | Freq: Once | INTRAMUSCULAR | Status: DC | PRN

## 2016-01-13 MED ORDER — METOCLOPRAMIDE HCL 5 MG/ML IJ SOLN: 5.0000 mg | Freq: Three times a day (TID) | INTRAMUSCULAR | Status: DC | PRN

## 2016-01-13 MED ORDER — FENTANYL CITRATE (PF) 100 MCG/2ML IJ SOLN
INTRAMUSCULAR | Status: AC
  Filled 2016-01-13: qty 2

## 2016-01-13 MED ORDER — ONDANSETRON HCL 4 MG PO TABS: 4.0000 mg | ORAL_TABLET | Freq: Four times a day (QID) | ORAL | Status: DC | PRN

## 2016-01-13 MED ORDER — SODIUM CHLORIDE 0.9 % IV SOLN
INTRAVENOUS | Status: DC
Start: 2016-01-13 — End: 2016-01-13
  Administered 2016-01-13: 75 mL/h via INTRAVENOUS

## 2016-01-13 MED ORDER — LIDOCAINE HCL (CARDIAC) 20 MG/ML IV SOLN
INTRAVENOUS | Status: DC | PRN
  Administered 2016-01-13: 40 mg via INTRAVENOUS

## 2016-01-13 MED ORDER — MIDAZOLAM HCL 2 MG/2ML IJ SOLN: INTRAMUSCULAR | Status: DC | PRN

## 2016-01-13 MED ORDER — OXYCODONE-ACETAMINOPHEN 5-325 MG PO TABS
ORAL_TABLET | ORAL | Status: AC
  Administered 2016-01-13: 1 via ORAL
  Filled 2016-01-13: qty 1

## 2016-01-13 MED ORDER — NEOMYCIN-POLYMYXIN B GU 40-200000 IR SOLN
Status: AC
  Filled 2016-01-13: qty 2

## 2016-01-13 MED ORDER — EPHEDRINE SULFATE 50 MG/ML IJ SOLN
INTRAMUSCULAR | Status: DC | PRN
  Administered 2016-01-13 (×2): 15 mg via INTRAVENOUS

## 2016-01-13 MED ORDER — OXYCODONE-ACETAMINOPHEN 5-325 MG PO TABS
1.0000 | ORAL_TABLET | ORAL | Status: DC | PRN
  Administered 2016-01-13: 1 via ORAL

## 2016-01-13 MED ORDER — FENTANYL CITRATE (PF) 100 MCG/2ML IJ SOLN
25.0000 ug | INTRAMUSCULAR | Status: AC | PRN
  Administered 2016-01-13 (×6): 25 ug via INTRAVENOUS

## 2016-01-13 MED ORDER — FENTANYL CITRATE (PF) 100 MCG/2ML IJ SOLN
INTRAMUSCULAR | Status: DC | PRN
  Administered 2016-01-13: 50 ug via INTRAVENOUS
  Administered 2016-01-13: 25 ug via INTRAVENOUS

## 2016-01-13 MED ORDER — PROPOFOL 10 MG/ML IV BOLUS
INTRAVENOUS | Status: DC | PRN
  Administered 2016-01-13: 100 mg via INTRAVENOUS

## 2016-01-13 MED ORDER — METOCLOPRAMIDE HCL 10 MG PO TABS: 5.0000 mg | ORAL_TABLET | Freq: Three times a day (TID) | ORAL | Status: DC | PRN

## 2016-01-13 MED ORDER — FENTANYL CITRATE (PF) 100 MCG/2ML IJ SOLN
INTRAMUSCULAR | Status: AC
  Administered 2016-01-13: 25 ug via INTRAVENOUS
  Filled 2016-01-13: qty 2

## 2016-01-13 MED ORDER — ONDANSETRON HCL 4 MG/2ML IJ SOLN
INTRAMUSCULAR | Status: DC | PRN
  Administered 2016-01-13: 4 mg via INTRAVENOUS

## 2016-01-13 MED ORDER — PHENYLEPHRINE HCL 10 MG/ML IJ SOLN
INTRAMUSCULAR | Status: DC | PRN
  Administered 2016-01-13 (×2): 100 ug via INTRAVENOUS

## 2016-01-13 MED ORDER — MIDAZOLAM HCL 2 MG/2ML IJ SOLN
INTRAMUSCULAR | Status: DC | PRN
Start: 2016-01-13 — End: 2016-01-13
  Administered 2016-01-13: 1.5 mg via INTRAVENOUS

## 2016-01-13 MED ORDER — OXYCODONE-ACETAMINOPHEN 7.5-325 MG PO TABS: 1.0000 | ORAL_TABLET | ORAL | Status: DC | PRN

## 2016-01-13 MED ORDER — SODIUM CHLORIDE 0.9 % IV SOLN: INTRAVENOUS | Status: DC

## 2016-01-13 MED ORDER — ONDANSETRON HCL 4 MG/2ML IJ SOLN: 4.0000 mg | Freq: Four times a day (QID) | INTRAMUSCULAR | Status: DC | PRN

## 2016-01-13 SURGICAL SUPPLY — 46 items
.062 Stainless Steel K. Wires KW062-SS ×3 IMPLANT
2.0mm Silver F.A.S.T. Drill Bits ×2 IMPLANT
BANDAGE ACE 4X5 VEL STRL LF (GAUZE/BANDAGES/DRESSINGS) ×3 IMPLANT
BIT DRILL 2 FAST STEP (BIT) ×3 IMPLANT
BIT DRILL 2.5X4 QC (BIT) ×3 IMPLANT
CANISTER SUCT 1200ML W/VALVE (MISCELLANEOUS) ×3 IMPLANT
CHLORAPREP W/TINT 26ML (MISCELLANEOUS) ×3 IMPLANT
DB 2.5mm Drill Bits ×2 IMPLANT
DRAPE FLUOR MINI C-ARM 54X84 (DRAPES) ×3 IMPLANT
ELECT REM PT RETURN 9FT ADLT (ELECTROSURGICAL) ×3
ELECTRODE REM PT RTRN 9FT ADLT (ELECTROSURGICAL) ×1 IMPLANT
GAUZE PETRO XEROFOAM 1X8 (MISCELLANEOUS) ×6 IMPLANT
GAUZE SPONGE 4X4 12PLY STRL (GAUZE/BANDAGES/DRESSINGS) ×3 IMPLANT
GLOVE BIOGEL PI IND STRL 9 (GLOVE) ×2 IMPLANT
GLOVE BIOGEL PI INDICATOR 9 (GLOVE) ×4
GLOVE SURG ORTHO 9.0 STRL STRW (GLOVE) ×6 IMPLANT
GOWN SPECIALTY ULTRA XL (MISCELLANEOUS) ×3 IMPLANT
GOWN STRL REUS W/ TWL LRG LVL3 (GOWN DISPOSABLE) ×1 IMPLANT
GOWN STRL REUS W/TWL LRG LVL3 (GOWN DISPOSABLE) ×2
K-WIRE 1.6 (WIRE) ×2
K-WIRE FX5X1.6XNS BN SS (WIRE) ×1
KIT RM TURNOVER STRD PROC AR (KITS) ×3 IMPLANT
KWIRE FX5X1.6XNS BN SS (WIRE) ×1 IMPLANT
NEEDLE FILTER BLUNT 18X 1/2SAF (NEEDLE) ×2
NEEDLE FILTER BLUNT 18X1 1/2 (NEEDLE) ×1 IMPLANT
NS IRRIG 500ML POUR BTL (IV SOLUTION) ×3 IMPLANT
PACK EXTREMITY ARMC (MISCELLANEOUS) ×3 IMPLANT
PAD CAST CTTN 4X4 STRL (SOFTGOODS) ×2 IMPLANT
PADDING CAST COTTON 4X4 STRL (SOFTGOODS) ×4
PEG SUBCHONDRAL SMOOTH 2.0X16 (Peg) ×3 IMPLANT
PEG SUBCHONDRAL SMOOTH 2.0X18 (Peg) ×6 IMPLANT
PEG SUBCHONDRAL SMOOTH 2.0X24 (Peg) ×3 IMPLANT
PEG SUBCHONDRAL SMOOTH 2.0X26 (Peg) ×3 IMPLANT
PLATE SHORT 21.6X48.9 NRRW RT (Plate) ×3 IMPLANT
PUTTY BONE .5ML HUMAN (Tissue) ×3 IMPLANT
SCREW BN 12X3.5XNS CORT TI (Screw) ×2 IMPLANT
SCREW CORT 3.5X10 LNG (Screw) ×3 IMPLANT
SCREW CORT 3.5X12 (Screw) ×4 IMPLANT
SCREW MULTI DIRECT 22MM (Screw) ×3 IMPLANT
SPLINT CAST 1 STEP 3X12 (MISCELLANEOUS) ×3 IMPLANT
STOCKINETTE STRL 4IN 9604848 (GAUZE/BANDAGES/DRESSINGS) ×3 IMPLANT
SUT ETHILON 4-0 (SUTURE) ×2
SUT ETHILON 4-0 FS2 18XMFL BLK (SUTURE) ×1
SUT VICRYL 3-0 27IN (SUTURE) ×3 IMPLANT
SUTURE ETHLN 4-0 FS2 18XMF BLK (SUTURE) ×1 IMPLANT
SYR 3ML LL SCALE MARK (SYRINGE) ×3 IMPLANT

## 2016-01-13 NOTE — Transfer of Care (Signed)
Immediate Anesthesia Transfer of Care Note  Patient: Julie Mcmillan  Procedure(s) Performed: Procedure(s): OPEN REDUCTION INTERNAL FIXATION (ORIF) DISTAL RADIAL FRACTURE (Right)  Patient Location: PACU  Anesthesia Type:General  Level of Consciousness: sedated  Airway & Oxygen Therapy: Patient Spontanous Breathing and Patient connected to face mask oxygen  Post-op Assessment: Report given to RN and Post -op Vital signs reviewed and stable  Post vital signs: Reviewed and stable  Last Vitals:  Filed Vitals:   01/13/16 0614 01/13/16 0901  BP: 167/70 140/70  Pulse: 66   Temp: 36 C 36.3 C  Resp: 16 12    Complications: No apparent anesthesia complications

## 2016-01-13 NOTE — H&P (Signed)
Reviewed paper H+P, will be scanned into chart. No changes noted.

## 2016-01-13 NOTE — Anesthesia Postprocedure Evaluation (Signed)
Anesthesia Post Note  Patient: ADELIN VENTRELLA  Procedure(s) Performed: Procedure(s) (LRB): OPEN REDUCTION INTERNAL FIXATION (ORIF) DISTAL RADIAL FRACTURE (Right)  Patient location during evaluation: PACU Anesthesia Type: General Level of consciousness: awake and alert Pain management: pain level controlled Vital Signs Assessment: post-procedure vital signs reviewed and stable Respiratory status: spontaneous breathing and respiratory function stable Cardiovascular status: stable Anesthetic complications: no    Last Vitals:  Filed Vitals:   01/13/16 0941 01/13/16 0946  BP:  143/67  Pulse: 49 50  Temp:  36.7 C  Resp: 12 9    Last Pain:  Filed Vitals:   01/13/16 0954  PainSc: 7                  Airrion Otting K

## 2016-01-13 NOTE — Discharge Instructions (Addendum)
Keep arm elevated as much as possible. Work fingers is much as possible. Ice to back of wrist today and tomorrowAMBULATORY SURGERY  DISCHARGE INSTRUCTIONS   1) The drugs that you were given will stay in your system until tomorrow so for the next 24 hours you should not:  A) Drive an automobile B) Make any legal decisions C) Drink any alcoholic beverage   2) You may resume regular meals tomorrow.  Today it is better to start with liquids and gradually work up to solid foods.  You may eat anything you prefer, but it is better to start with liquids, then soup and crackers, and gradually work up to solid foods.   3) Please notify your doctor immediately if you have any unusual bleeding, trouble breathing, redness and pain at the surgery site, drainage, fever, or pain not relieved by medication.    4) Additional Instructions:        Please contact your physician with any problems or Same Day Surgery at [phone], Monday through Friday 6 am to 4 pm, or [place] at [facility] number at [phone].

## 2016-01-13 NOTE — Anesthesia Procedure Notes (Signed)
Procedure Name: LMA Insertion Date/Time: 01/13/2016 8:00 AM Performed by: Allean Found Pre-anesthesia Checklist: Patient identified, Emergency Drugs available, Suction available, Patient being monitored and Timeout performed Patient Re-evaluated:Patient Re-evaluated prior to inductionOxygen Delivery Method: Circle system utilized Preoxygenation: Pre-oxygenation with 100% oxygen Intubation Type: IV induction Ventilation: Mask ventilation without difficulty LMA: LMA inserted LMA Size: 4.0 Number of attempts: 1 Placement Confirmation: positive ETCO2 and breath sounds checked- equal and bilateral Tube secured with: Tape Dental Injury: Teeth and Oropharynx as per pre-operative assessment  Comments: lubed with lidocaine jelly

## 2016-01-13 NOTE — Op Note (Signed)
01/13/2016  9:08 AM  PATIENT:  Julie Mcmillan  75 y.o. female  PRE-OPERATIVE DIAGNOSIS:  displaced fracture of distal end of right radius  POST-OPERATIVE DIAGNOSIS:  displaced fracture of distal end of right radius  PROCEDURE:  Procedure(s): OPEN REDUCTION INTERNAL FIXATION (ORIF) DISTAL RADIAL FRACTURE (Right)  SURGEON: Laurene Footman, MD  ASSISTANTS: None  ANESTHESIA:   general  EBL:  Total I/O In: 700 [I.V.:700] Out: 10 [Blood:10]  BLOOD ADMINISTERED:none  DRAINS: none   LOCAL MEDICATIONS USED:  NONE  SPECIMEN:  No Specimen  DISPOSITION OF SPECIMEN:  N/A  COUNTS:  YES  TOURNIQUET:   30 minutes at 250 mmHg  IMPLANTS: Hand innovations right short narrow DVR plate with multiple screws and pegs  DICTATION: .Dragon Dictation patient brought the operating room and after adequate anesthesia was obtained the right arm was prepped and draped in sterile fashion with a tourniquet by the upper arm. After patient identification and timeout procedures were completed, tourniquet was raised. Fingertrap traction was applied over the end of the table and a volar approach is made centered over the FCR tendon. Hemostasis was achieved electrocautery. The tendon sheath was incised and the tendon retracted radially protect the radial artery and associated veins. The deep fascia was incised and the deep muscles were retracted ulnarly with the pronator elevated off the distal radius and fracture. The fracture was quite comminuted but with the traction and these of a freer the depressed volar fragment could be rotated to the appropriate position. There was a defect secondary to crush injury and so 0.5 cc of DBX bone putty was injected into the defect to aid in bone healing. A volar plate was then applied and pinned in place the oblique screw hole was filled and the plate adjusted to the appropriate location distally. The remaining cortical screws were placed and the proximal plate and with the wrist  in a flexed position the smooth pegs were placed through the distal portion of the plate using the fast guides one of the screws appear that may go into the joint so the incentive a smooth pegs a multidirectional screws placed on multiple views of the wrist there was no penetration of the pegs into the joint through the subchondral bone. The traction was removed and on range of motion the fracture appeared stable the wound was irrigated and tourniquet let down is no significant bleeding. The wound was then closed with 3-0 Vicryl subcutaneously and 4-0 nylon in a simple interrupted fashion. Xeroform 4 x 4's web roll and a volar splint were applied along with an Ace wrap.  PLAN OF CARE: Discharge to home after PACU  PATIENT DISPOSITION:  PACU - hemodynamically stable.

## 2016-01-13 NOTE — Anesthesia Preprocedure Evaluation (Signed)
Anesthesia Evaluation  Patient identified by MRN, date of birth, ID band Patient awake    Reviewed: Allergy & Precautions, NPO status , Patient's Chart, lab work & pertinent test results  History of Anesthesia Complications Negative for: history of anesthetic complications  Airway Mallampati: III       Dental   Pulmonary shortness of breath, sleep apnea (pr denies) ,           Cardiovascular hypertension, Pt. on medications and Pt. on home beta blockers + dysrhythmias Atrial Fibrillation      Neuro/Psych Anxiety Depression    GI/Hepatic Neg liver ROS, GERD  Medicated and Controlled,  Endo/Other  diabetes, Type 2, Oral Hypoglycemic AgentsHypothyroidism   Renal/GU negative Renal ROS     Musculoskeletal   Abdominal   Peds  Hematology  (+) anemia ,   Anesthesia Other Findings   Reproductive/Obstetrics                             Anesthesia Physical Anesthesia Plan  ASA: III  Anesthesia Plan: General   Post-op Pain Management:    Induction: Intravenous  Airway Management Planned: LMA  Additional Equipment:   Intra-op Plan:   Post-operative Plan:   Informed Consent: I have reviewed the patients History and Physical, chart, labs and discussed the procedure including the risks, benefits and alternatives for the proposed anesthesia with the patient or authorized representative who has indicated his/her understanding and acceptance.     Plan Discussed with:   Anesthesia Plan Comments:         Anesthesia Quick Evaluation

## 2016-01-26 ENCOUNTER — Encounter: Payer: Self-pay | Admitting: *Deleted

## 2016-01-26 DIAGNOSIS — R0609 Other forms of dyspnea: Secondary | ICD-10-CM

## 2016-02-23 DIAGNOSIS — F325 Major depressive disorder, single episode, in full remission: Secondary | ICD-10-CM | POA: Insufficient documentation

## 2016-05-24 ENCOUNTER — Encounter: Payer: Self-pay | Admitting: *Deleted

## 2016-05-25 ENCOUNTER — Ambulatory Visit: Payer: Medicare Other | Admitting: Certified Registered Nurse Anesthetist

## 2016-05-25 ENCOUNTER — Encounter: Payer: Self-pay | Admitting: *Deleted

## 2016-05-25 ENCOUNTER — Encounter
Admission: RE | Disposition: A | Discharge status: Home / Self Care | Payer: Self-pay | Source: Ambulatory Visit | Attending: Unknown Physician Specialty

## 2016-05-25 ENCOUNTER — Ambulatory Visit
Admission: RE | Admit: 2016-05-25 | Discharge: 2016-05-25 | Disposition: A | Discharge status: Home / Self Care | Payer: Medicare Other | Source: Ambulatory Visit | Attending: Unknown Physician Specialty | Admitting: Unknown Physician Specialty

## 2016-05-25 DIAGNOSIS — J9801 Acute bronchospasm: Secondary | ICD-10-CM | POA: Diagnosis not present

## 2016-05-25 DIAGNOSIS — I4891 Unspecified atrial fibrillation: Secondary | ICD-10-CM | POA: Insufficient documentation

## 2016-05-25 DIAGNOSIS — Z90710 Acquired absence of both cervix and uterus: Secondary | ICD-10-CM | POA: Diagnosis not present

## 2016-05-25 DIAGNOSIS — E119 Type 2 diabetes mellitus without complications: Secondary | ICD-10-CM | POA: Diagnosis not present

## 2016-05-25 DIAGNOSIS — Z791 Long term (current) use of non-steroidal anti-inflammatories (NSAID): Secondary | ICD-10-CM | POA: Diagnosis not present

## 2016-05-25 DIAGNOSIS — Z803 Family history of malignant neoplasm of breast: Secondary | ICD-10-CM | POA: Insufficient documentation

## 2016-05-25 DIAGNOSIS — Z887 Allergy status to serum and vaccine status: Secondary | ICD-10-CM | POA: Diagnosis not present

## 2016-05-25 DIAGNOSIS — K219 Gastro-esophageal reflux disease without esophagitis: Secondary | ICD-10-CM | POA: Diagnosis present

## 2016-05-25 DIAGNOSIS — K5730 Diverticulosis of large intestine without perforation or abscess without bleeding: Secondary | ICD-10-CM | POA: Insufficient documentation

## 2016-05-25 DIAGNOSIS — Z9841 Cataract extraction status, right eye: Secondary | ICD-10-CM | POA: Diagnosis not present

## 2016-05-25 DIAGNOSIS — F339 Major depressive disorder, recurrent, unspecified: Secondary | ICD-10-CM | POA: Insufficient documentation

## 2016-05-25 DIAGNOSIS — Z8349 Family history of other endocrine, nutritional and metabolic diseases: Secondary | ICD-10-CM | POA: Insufficient documentation

## 2016-05-25 DIAGNOSIS — Z833 Family history of diabetes mellitus: Secondary | ICD-10-CM | POA: Diagnosis not present

## 2016-05-25 DIAGNOSIS — K5732 Diverticulitis of large intestine without perforation or abscess without bleeding: Secondary | ICD-10-CM | POA: Diagnosis present

## 2016-05-25 DIAGNOSIS — Z9889 Other specified postprocedural states: Secondary | ICD-10-CM | POA: Insufficient documentation

## 2016-05-25 DIAGNOSIS — Z961 Presence of intraocular lens: Secondary | ICD-10-CM | POA: Insufficient documentation

## 2016-05-25 DIAGNOSIS — Z8249 Family history of ischemic heart disease and other diseases of the circulatory system: Secondary | ICD-10-CM | POA: Insufficient documentation

## 2016-05-25 DIAGNOSIS — Z9049 Acquired absence of other specified parts of digestive tract: Secondary | ICD-10-CM | POA: Insufficient documentation

## 2016-05-25 DIAGNOSIS — K640 First degree hemorrhoids: Secondary | ICD-10-CM | POA: Insufficient documentation

## 2016-05-25 DIAGNOSIS — J849 Interstitial pulmonary disease, unspecified: Secondary | ICD-10-CM | POA: Diagnosis not present

## 2016-05-25 DIAGNOSIS — Z7901 Long term (current) use of anticoagulants: Secondary | ICD-10-CM | POA: Insufficient documentation

## 2016-05-25 DIAGNOSIS — K589 Irritable bowel syndrome without diarrhea: Secondary | ICD-10-CM | POA: Diagnosis not present

## 2016-05-25 DIAGNOSIS — I10 Essential (primary) hypertension: Secondary | ICD-10-CM | POA: Insufficient documentation

## 2016-05-25 DIAGNOSIS — Z79899 Other long term (current) drug therapy: Secondary | ICD-10-CM | POA: Insufficient documentation

## 2016-05-25 DIAGNOSIS — Z7984 Long term (current) use of oral hypoglycemic drugs: Secondary | ICD-10-CM | POA: Diagnosis not present

## 2016-05-25 HISTORY — PX: COLONOSCOPY WITH PROPOFOL: SHX5780

## 2016-05-25 HISTORY — PX: ESOPHAGOGASTRODUODENOSCOPY (EGD) WITH PROPOFOL: SHX5813

## 2016-05-25 LAB — GLUCOSE, CAPILLARY: Glucose-Capillary: 124 mg/dL — ABNORMAL HIGH (ref 65–99)

## 2016-05-25 SURGERY — ESOPHAGOGASTRODUODENOSCOPY (EGD) WITH PROPOFOL
Anesthesia: General

## 2016-05-25 MED ORDER — IPRATROPIUM-ALBUTEROL 0.5-2.5 (3) MG/3ML IN SOLN
RESPIRATORY_TRACT | Status: AC
  Filled 2016-05-25: qty 3

## 2016-05-25 MED ORDER — SODIUM CHLORIDE 0.9 % IV SOLN
INTRAVENOUS | Status: DC
  Administered 2016-05-25: 1000 mL via INTRAVENOUS

## 2016-05-25 MED ORDER — IPRATROPIUM-ALBUTEROL 0.5-2.5 (3) MG/3ML IN SOLN
3.0000 mL | Freq: Once | RESPIRATORY_TRACT | Status: AC
  Administered 2016-05-25: 3 mL via RESPIRATORY_TRACT

## 2016-05-25 MED ORDER — PHENYLEPHRINE HCL 10 MG/ML IJ SOLN
INTRAMUSCULAR | Status: DC | PRN
  Administered 2016-05-25: 100 ug via INTRAVENOUS

## 2016-05-25 MED ORDER — MIDAZOLAM HCL 2 MG/2ML IJ SOLN
INTRAMUSCULAR | Status: DC | PRN
  Administered 2016-05-25: 1 mg via INTRAVENOUS

## 2016-05-25 MED ORDER — PROPOFOL 500 MG/50ML IV EMUL
INTRAVENOUS | Status: DC | PRN
  Administered 2016-05-25: 140 ug/kg/min via INTRAVENOUS

## 2016-05-25 MED ORDER — PROPOFOL 10 MG/ML IV BOLUS
INTRAVENOUS | Status: DC | PRN
  Administered 2016-05-25: 30 mg via INTRAVENOUS

## 2016-05-25 MED ORDER — SODIUM CHLORIDE 0.9 % IV SOLN: INTRAVENOUS | Status: DC

## 2016-05-25 MED ORDER — FLEET ENEMA 7-19 GM/118ML RE ENEM
1.0000 | ENEMA | Freq: Once | RECTAL | Status: AC
  Administered 2016-05-25: 1 via RECTAL

## 2016-05-25 MED ORDER — BUTAMBEN-TETRACAINE-BENZOCAINE 2-2-14 % EX AERO
INHALATION_SPRAY | CUTANEOUS | Status: AC
  Filled 2016-05-25: qty 20

## 2016-05-25 NOTE — Anesthesia Procedure Notes (Signed)
Date/Time: 05/25/2016 7:57 AM Performed by: Johnna Acosta Pre-anesthesia Checklist: Patient identified, Emergency Drugs available, Suction available, Patient being monitored and Timeout performed Patient Re-evaluated:Patient Re-evaluated prior to inductionOxygen Delivery Method: Nasal cannula

## 2016-05-25 NOTE — Op Note (Signed)
[facility] Gastroenterology Patient Name: Julie Mcmillan Procedure Date: 05/25/2016 7:55 AM MRN: [id number] Account #: [id number] Date of Birth: 1941-02-02 Admit Type: Outpatient Age: 75 Room: [facility] ENDO ROOM 4 Gender: Female Note Status: Finalized Procedure:            Colonoscopy Indications:          Follow-up of diverticulitis Providers:            Manya Silvas, MD Referring MD:         Glendon Axe (Referring MD) Medicines:            Propofol per Anesthesia Complications:        No immediate complications. Procedure:            Pre-Anesthesia Assessment:                       - After reviewing the risks and benefits, the patient                        was deemed in satisfactory condition to undergo the                        procedure.                       After obtaining informed consent, the colonoscope was                        passed under direct vision. Throughout the procedure,                        the patient's blood pressure, pulse, and oxygen                        saturations were monitored continuously. The                        Colonoscope was introduced through the anus and                        advanced to the the cecum, identified by appendiceal                        orifice and ileocecal valve. The colonoscopy was                        performed without difficulty. The patient tolerated the                        procedure well. The quality of the bowel preparation                        was good. Findings:      Multiple small-mouthed diverticula were found in the sigmoid colon,       descending colon, transverse colon and ascending colon.      Internal hemorrhoids were found during endoscopy. The hemorrhoids were       small and Grade I (internal hemorrhoids that do not prolapse).      The exam was otherwise without abnormality. Impression:           - Diverticulosis in the sigmoid colon,  in the   descending colon, in the transverse colon and in the                        ascending colon.                       - Internal hemorrhoids.                       - The examination was otherwise normal.                       - No specimens collected. Recommendation:       - The findings and recommendations were discussed with                        the patient and family. Manya Silvas, MD 05/25/2016 8:39:54 AM This report has been signed electronically. Number of Addenda: 0 Note Initiated On: 05/25/2016 7:55 AM Scope Withdrawal Time: 0 hours 4 minutes 43 seconds  Total Procedure Duration: 0 hours 8 minutes 39 seconds       [facility]

## 2016-05-25 NOTE — H&P (Signed)
Primary Care Physician:  Glendon Axe, MD Primary Gastroenterologist:  Dr. Vira Agar  Pre-Procedure History & Physical: HPI:  Julie Mcmillan is a 75 y.o. female is here for an endoscopy and colonoscopy.   Past Medical History:  Diagnosis Date  . A-fib ([place])   . Chronic diarrhea   . Diverticulitis   . GERD (gastroesophageal reflux disease)    usually takes protonix  . Hip fracture ([place]) 11/2015   hairline fracture on right.  no surgery, just physical therapy  . Hypertension   . IBS (irritable bowel syndrome)   . IBS (irritable bowel syndrome)   . Interstitial lung disease ([place])    does not remember when  . Seasonal affective disorder ([place])   . Shortness of breath dyspnea    with exertion  . Type 2 diabetes mellitus ([place]) 11/04/2014    Past Surgical History:  Procedure Laterality Date  . ABDOMINAL HYSTERECTOMY     partial  . APPENDECTOMY    . CATARACT EXTRACTION W/PHACO Right 03/16/2015   Procedure: CATARACT EXTRACTION PHACO AND INTRAOCULAR LENS PLACEMENT (IOC);  Surgeon: Leandrew Koyanagi, MD;  Location: [place];  Service: Ophthalmology;  Laterality: Right;  . COLONOSCOPY    . NISSEN FUNDOPLICATION  1610   came apart in 2016 and ibs and reflux returned  . OPEN REDUCTION INTERNAL FIXATION (ORIF) DISTAL RADIAL FRACTURE Right 01/13/2016   Procedure: OPEN REDUCTION INTERNAL FIXATION (ORIF) DISTAL RADIAL FRACTURE;  Surgeon: Hessie Knows, MD;  Location: [facility] ORS;  Service: Orthopedics;  Laterality: Right;  . TONSILLECTOMY     and adenoidectomy    Prior to Admission medications   Medication Sig Start Date End Date Taking? Authorizing Provider  hydrALAZINE (APRESOLINE) 25 MG tablet Take 25 mg by mouth 3 (three) times daily.   Yes Historical Provider, MD  losartan (COZAAR) 100 MG tablet Take 100 mg by mouth daily.    Yes Historical Provider, MD  metoprolol (LOPRESSOR) 100 MG tablet Take 1 tablet by mouth 2 (two) times daily. 09/07/15  Yes Historical Provider, MD   sotalol (BETAPACE) 80 MG tablet Take 1 tablet (80 mg total) by mouth every 12 (twelve) hours. 03/29/15  Yes Glendon Axe, MD  amoxicillin-clavulanate (AUGMENTIN) 875-125 MG tablet Take 1 tablet by mouth 2 (two) times daily. Reported on 01/13/2016 11/23/15   Historical Provider, MD  apixaban (ELIQUIS) 5 MG TABS tablet Take 5 mg by mouth 2 (two) times daily.    Historical Provider, MD  Calcium Carbonate-Vitamin D (CALCIUM 600+D) 600-200 MG-UNIT TABS Take 1 tablet by mouth daily.    Historical Provider, MD  cholestyramine Lucrezia Starch) 4 GM/DOSE powder Take 1 packet by mouth 4 (four) times daily. 09/12/15   Historical Provider, MD  dicyclomine (BENTYL) 20 MG tablet Take 1 tablet (20 mg total) by mouth 3 (three) times daily as needed for spasms. Patient not taking: Reported on 01/10/2016 11/10/15   Daymon Larsen, MD  diltiazem (CARDIZEM CD) 120 MG 24 hr capsule Take 1 capsule (120 mg total) by mouth daily. 11/29/15   Srikar Sudini, MD  glimepiride (AMARYL) 2 MG tablet Take 1 tablet by mouth 1 day or 1 dose. 11/05/15   Historical Provider, MD  ipratropium-albuterol (DUONEB) 0.5-2.5 (3) MG/3ML SOLN Take 3 mLs by nebulization every 4 (four) hours as needed. Patient not taking: Reported on 01/13/2016 11/29/15   Hillary Bow, MD  levofloxacin (LEVAQUIN) 750 MG tablet Take 1 tablet (750 mg total) by mouth daily. Patient not taking: Reported on 01/10/2016 11/29/15   Hillary Bow, MD  levothyroxine (SYNTHROID, LEVOTHROID) 50 MCG tablet Take 50 mcg by mouth daily before breakfast.    Historical Provider, MD  metFORMIN (GLUCOPHAGE) 500 MG tablet Take 1 tablet (500 mg total) by mouth 2 (two) times daily with a meal. Patient not taking: Reported on 01/10/2016 11/29/15   Hillary Bow, MD  metroNIDAZOLE (FLAGYL) 500 MG tablet Take 1 tablet by mouth 1 day or 1 dose. Reported on 01/13/2016 11/15/15   Historical Provider, MD  Multiple Vitamin (MULTIVITAMIN WITH MINERALS) TABS tablet Take 1 tablet by mouth daily.    Historical  Provider, MD  naproxen sodium (ANAPROX) 220 MG tablet Take 220 mg by mouth 2 (two) times daily with a meal.    Historical Provider, MD  ondansetron (ZOFRAN ODT) 4 MG disintegrating tablet Take 1 tablet (4 mg total) by mouth every 8 (eight) hours as needed for nausea or vomiting. Patient not taking: Reported on 01/10/2016 11/15/15   Loney Hering, MD  oxyCODONE-acetaminophen (PERCOCET) 7.5-325 MG tablet Take 1 tablet by mouth every 4 (four) hours as needed for severe pain. 01/13/16   Hessie Knows, MD  pantoprazole (PROTONIX) 40 MG tablet Take 1 tablet (40 mg total) by mouth daily. 03/29/15   Glendon Axe, MD  predniSONE (DELTASONE) 20 MG tablet Take 2 tablets (40 mg total) by mouth daily with breakfast. 11/29/15   Hillary Bow, MD  venlafaxine XR (EFFEXOR-XR) 150 MG 24 hr capsule Take 1 capsule (150 mg total) by mouth daily with lunch. 03/29/15   Glendon Axe, MD    Allergies as of 04/17/2016 - Review Complete 01/13/2016  Allergen Reaction Noted  . Amlodipine Swelling 03/25/2015  . Nexium [esomeprazole magnesium] Diarrhea 03/10/2015    Family History  Problem Relation Age of Onset  . Breast cancer Mother   . Diabetes Mellitus II Mother   . Hypothyroidism Mother   . Atrial fibrillation Mother   . Heart attack Father     Social History   Social History  . Marital status: Divorced    Spouse name: N/A  . Number of children: N/A  . Years of education: N/A   Occupational History  . Not on file.   Social History Main Topics  . Smoking status: Never Smoker  . Smokeless tobacco: Never Used     Comment: no passive smokers in home  . Alcohol use No  . Drug use: Unknown  . Sexual activity: Not on file   Other Topics Concern  . Not on file   Social History Narrative  . No narrative on file    Review of Systems: See HPI, otherwise negative ROS  Physical Exam: BP (!) 168/69   Pulse 83   Temp 97.6 F (36.4 C) (Tympanic)   Resp 16   Ht _0  (1.626 m)   Wt 63.5 kg (140 lb)    SpO2 97%   BMI 24.03 kg/m  General:   Alert,  pleasant and cooperative in NAD Head:  Normocephalic and atraumatic. Neck:  Supple; no masses or thyromegaly. Lungs:  Clear throughout to auscultation.    Heart:  Regular rate and rhythm. Abdomen:  Soft, nontender and nondistended. Normal bowel sounds, without guarding, and without rebound.   Neurologic:  Alert and  oriented x4;  grossly normal neurologically.  Impression/Plan: KASHINA MECUM is here for an endoscopy and colonoscopy to be performed for GERD and diverticulitis   Risks, benefits, limitations, and alternatives regarding  endoscopy and colonoscopy have been reviewed with the patient.  Questions have been answered.  All  parties agreeable.   Gaylyn Cheers, MD  05/25/2016, 7:46 AM

## 2016-05-25 NOTE — Op Note (Signed)
[facility] Gastroenterology Patient Name: Julie Mcmillan Procedure Date: 05/25/2016 7:55 AM MRN: [id number] Account #: [id number] Date of Birth: 02-Aug-1941 Admit Type: Outpatient Age: 75 Room: [facility] ENDO ROOM 4 Gender: Female Note Status: Finalized Procedure:            Upper GI endoscopy Providers:            Manya Silvas, MD Referring MD:         Glendon Axe (Referring MD) Complications:        bronchospasm.                       Bronchospasm Procedure:            Pre-Anesthesia Assessment:                       - After reviewing the risks and benefits, the patient                        was deemed in satisfactory condition to undergo the                        procedure.                       After obtaining informed consent, the endoscope was                        passed under direct vision. Throughout the procedure,                        the patient's blood pressure, pulse, and oxygen                        saturations were monitored continuously. The                        Colonoscope was introduced through the mouth, with the                        intention of advancing to the esophagus. The scope was                        advanced to the hypopharynx before the procedure was                        aborted. Medications were given. The patient tolerated                        the procedure poorly. The procedure was aborted due to                        the patient's respiratory instability (bronchospasm). Findings:      Pt had severe bronchospasm when scope inserted into hypopharynx and       procedure was aborted. Impression:           very sensitive upper airway.                       - The procedure was aborted due to the patient's  respiratory instability (bronchospasm).                       - No specimens collected. Recommendation:       Do barium study in future to assess UGI tract.                       - The  findings and recommendations were discussed with                        the patient. Manya Silvas, MD 05/25/2016 8:27:22 AM This report has been signed electronically. Number of Addenda: 0 Note Initiated On: 05/25/2016 7:55 AM      [facility]

## 2016-05-25 NOTE — Anesthesia Preprocedure Evaluation (Signed)
Anesthesia Evaluation  Patient identified by MRN, date of birth, ID band Patient awake    Reviewed: Allergy & Precautions, NPO status , Patient's Chart, lab work & pertinent test results  History of Anesthesia Complications Negative for: history of anesthetic complications  Airway Mallampati: III       Dental  (+) Caps, Chipped, Missing, Poor Dentition   Pulmonary shortness of breath, sleep apnea (pr denies) , neg COPD, neg recent URI,  Interstitial lung disease          Cardiovascular hypertension, Pt. on medications and Pt. on home beta blockers (-) angina(-) CAD, (-) Past MI, (-) Cardiac Stents and (-) CABG + dysrhythmias Atrial Fibrillation (-) Valvular Problems/Murmurs     Neuro/Psych PSYCHIATRIC DISORDERS (Depression)    GI/Hepatic Neg liver ROS, GERD  Medicated and Controlled,  Endo/Other  diabetes, Type 2, Oral Hypoglycemic AgentsHypothyroidism   Renal/GU negative Renal ROS     Musculoskeletal   Abdominal   Peds  Hematology  (+) anemia ,   Anesthesia Other Findings Past Medical History: No date: A-fib ([place]) No date: Chronic diarrhea No date: Diverticulitis No date: GERD (gastroesophageal reflux disease)     Comment: usually takes protonix 11/2015: Hip fracture ([place])     Comment: hairline fracture on right.  no surgery, just               physical therapy No date: Hypertension No date: IBS (irritable bowel syndrome) No date: IBS (irritable bowel syndrome) No date: Interstitial lung disease ([place])     Comment: does not remember when No date: Seasonal affective disorder ([place]) No date: Shortness of breath dyspnea     Comment: with exertion 11/04/2014: Type 2 diabetes mellitus ([facility])   Reproductive/Obstetrics                             Anesthesia Physical  Anesthesia Plan  ASA: III  Anesthesia Plan: General   Post-op Pain Management:    Induction: Intravenous  Airway  Management Planned: LMA  Additional Equipment:   Intra-op Plan:   Post-operative Plan:   Informed Consent: I have reviewed the patients History and Physical, chart, labs and discussed the procedure including the risks, benefits and alternatives for the proposed anesthesia with the patient or authorized representative who has indicated his/her understanding and acceptance.     Plan Discussed with:   Anesthesia Plan Comments:         Anesthesia Quick Evaluation

## 2016-05-25 NOTE — Anesthesia Procedure Notes (Signed)
Date/Time: 05/25/2016 8:00 AM Performed by: Johnna Acosta Oxygen Delivery Method: Circle system utilized Preoxygenation: Pre-oxygenation with 100% oxygen Ventilation: Mask ventilation without difficulty Dental Injury: Teeth and Oropharynx as per pre-operative assessment

## 2016-05-25 NOTE — Transfer of Care (Signed)
Immediate Anesthesia Transfer of Care Note  Patient: Julie Mcmillan  Procedure(s) Performed: Procedure(s): ESOPHAGOGASTRODUODENOSCOPY (EGD) WITH PROPOFOL (N/A) COLONOSCOPY WITH PROPOFOL (N/A)  Patient Location: PACU  Anesthesia Type:General  Level of Consciousness: awake and sedated  Airway & Oxygen Therapy: Patient Spontanous Breathing and Patient connected to nasal cannula oxygen  Post-op Assessment: Report given to RN and Post -op Vital signs reviewed and stable  Post vital signs: Reviewed and stable  Last Vitals:  Vitals:   05/25/16 0843 05/25/16 0844  BP: (!) (P) 109/57 (!) 109/57  Pulse: (P) 81 81  Resp: (P) 16 16  Temp: 36.4 C 36.4 C    Last Pain:  Vitals:   05/25/16 0844  TempSrc: Tympanic  PainSc:          Complications: No apparent anesthesia complications

## 2016-05-25 NOTE — Anesthesia Postprocedure Evaluation (Signed)
Anesthesia Post Note  Patient: YOANNA JURCZYK  Procedure(s) Performed: Procedure(s) (LRB): ESOPHAGOGASTRODUODENOSCOPY (EGD) WITH PROPOFOL (N/A) COLONOSCOPY WITH PROPOFOL (N/A)  Patient location during evaluation: Endoscopy Anesthesia Type: General Level of consciousness: awake and alert Pain management: pain level controlled Vital Signs Assessment: post-procedure vital signs reviewed and stable Respiratory status: spontaneous breathing, nonlabored ventilation, respiratory function stable and patient connected to nasal cannula oxygen Cardiovascular status: blood pressure returned to baseline and stable Postop Assessment: no signs of nausea or vomiting Anesthetic complications: no    Last Vitals:  Vitals:   05/25/16 0900 05/25/16 0910  BP: 124/68 (!) 142/70  Pulse:    Resp:    Temp:      Last Pain:  Vitals:   05/25/16 0850  TempSrc: Tympanic  PainSc:                  Martha Clan

## 2016-05-28 ENCOUNTER — Encounter: Payer: Self-pay | Admitting: Unknown Physician Specialty

## 2016-06-06 ENCOUNTER — Encounter: Payer: Self-pay | Admitting: *Deleted

## 2016-06-08 NOTE — Discharge Instructions (Signed)
Cataract Surgery, Care After Refer to this sheet in the next few weeks. These instructions provide you with information on caring for yourself after your procedure. Your caregiver may also give you more specific instructions. Your treatment has been planned according to current medical practices, but problems sometimes occur. Call your caregiver if you have any problems or questions after your procedure.  HOME CARE INSTRUCTIONS   Avoid strenuous activities as directed by your caregiver.  Ask your caregiver when you can resume driving.  Use eyedrops or other medicines to help healing and control pressure inside your eye as directed by your caregiver.  Only take over-the-counter or prescription medicines for pain, discomfort, or fever as directed by your caregiver.  Do not to touch or rub your eyes.  You may be instructed to use a protective shield during the first few days and nights after surgery. If not, wear sunglasses to protect your eyes. This is to protect the eye from pressure or from being accidentally bumped.  Keep the area around your eye clean and dry. Avoid swimming or allowing water to hit you directly in the face while showering. Keep soap and shampoo out of your eyes.  Do not bend or lift heavy objects. Bending increases pressure in the eye. You can walk, climb stairs, and do light household chores.  Do not put a contact lens into the eye that had surgery until your caregiver says it is okay to do so.  Ask your doctor when you can return to work. This will depend on the kind of work that you do. If you work in a dusty environment, you may be advised to wear protective eyewear for a period of time.  Ask your caregiver when it will be safe to engage in sexual activity.  Continue with your regular eye exams as directed by your caregiver. What to expect:  It is normal to feel itching and mild discomfort for a few days after cataract surgery. Some fluid discharge is also common,  and your eye may be sensitive to light and touch.  After 1 to 2 days, even moderate discomfort should disappear. In most cases, healing will take about 6 weeks.  If you received an intraocular lens (IOL), you may notice that colors are very bright or have a blue tinge. Also, if you have been in bright sunlight, everything may appear reddish for a few hours. If you see these color tinges, it is because your lens is clear and no longer cloudy. Within a few months after receiving an IOL, these extra colors should go away. When you have healed, you will probably need new glasses. SEEK MEDICAL CARE IF:   You have increased bruising around your eye.  You have discomfort not helped by medicine. SEEK IMMEDIATE MEDICAL CARE IF:   You have a fever.  You have a worsening or sudden vision loss.  You have redness, swelling, or increasing pain in the eye.  You have a thick discharge from the eye that had surgery. MAKE SURE YOU:  Understand these instructions.  Will watch your condition.  Will get help right away if you are not doing well or get worse.   This information is not intended to replace advice given to you by your health care provider. Make sure you discuss any questions you have with your health care provider.   Document Released: 04/06/2005 Document Revised: 10/08/2014 Document Reviewed: 05/11/2011 Elsevier Interactive Patient Education 2016 [place] Anesthesia, Adult, Care After Refer to  this sheet in the next few weeks. These instructions provide you with information on caring for yourself after your procedure. Your health care provider may also give you more specific instructions. Your treatment has been planned according to current medical practices, but problems sometimes occur. Call your health care provider if you have any problems or questions after your procedure. WHAT TO EXPECT AFTER THE PROCEDURE After the procedure, it is typical to  experience:  Sleepiness.  Nausea and vomiting. HOME CARE INSTRUCTIONS  For the first 24 hours after general anesthesia:  Have a responsible person with you.  Do not drive a car. If you are alone, do not take public transportation.  Do not drink alcohol.  Do not take medicine that has not been prescribed by your health care provider.  Do not sign important papers or make important decisions.  You may resume a normal diet and activities as directed by your health care provider.  Change bandages (dressings) as directed.  If you have questions or problems that seem related to general anesthesia, call the hospital and ask for the anesthetist or anesthesiologist on call. SEEK MEDICAL CARE IF:  You have nausea and vomiting that continue the day after anesthesia.  You develop a rash. SEEK IMMEDIATE MEDICAL CARE IF:   You have difficulty breathing.  You have chest pain.  You have any allergic problems.   This information is not intended to replace advice given to you by your health care provider. Make sure you discuss any questions you have with your health care provider.   Document Released: 12/24/2000 Document Revised: 10/08/2014 Document Reviewed: 01/16/2012 Elsevier Interactive Patient Education [employer].

## 2016-06-13 ENCOUNTER — Encounter
Admission: RE | Disposition: A | Discharge status: Home / Self Care | Payer: Self-pay | Source: Ambulatory Visit | Attending: Ophthalmology

## 2016-06-13 ENCOUNTER — Ambulatory Visit: Payer: Medicare Other | Admitting: Anesthesiology

## 2016-06-13 ENCOUNTER — Ambulatory Visit
Admission: RE | Admit: 2016-06-13 | Discharge: 2016-06-13 | Disposition: A | Discharge status: Home / Self Care | Payer: Medicare Other | Source: Ambulatory Visit | Attending: Ophthalmology | Admitting: Ophthalmology

## 2016-06-13 DIAGNOSIS — F329 Major depressive disorder, single episode, unspecified: Secondary | ICD-10-CM | POA: Diagnosis not present

## 2016-06-13 DIAGNOSIS — K5790 Diverticulosis of intestine, part unspecified, without perforation or abscess without bleeding: Secondary | ICD-10-CM | POA: Diagnosis not present

## 2016-06-13 DIAGNOSIS — I4891 Unspecified atrial fibrillation: Secondary | ICD-10-CM | POA: Insufficient documentation

## 2016-06-13 DIAGNOSIS — K589 Irritable bowel syndrome without diarrhea: Secondary | ICD-10-CM | POA: Diagnosis not present

## 2016-06-13 DIAGNOSIS — K219 Gastro-esophageal reflux disease without esophagitis: Secondary | ICD-10-CM | POA: Diagnosis not present

## 2016-06-13 DIAGNOSIS — H2512 Age-related nuclear cataract, left eye: Secondary | ICD-10-CM | POA: Insufficient documentation

## 2016-06-13 DIAGNOSIS — I10 Essential (primary) hypertension: Secondary | ICD-10-CM | POA: Diagnosis not present

## 2016-06-13 DIAGNOSIS — M810 Age-related osteoporosis without current pathological fracture: Secondary | ICD-10-CM | POA: Insufficient documentation

## 2016-06-13 DIAGNOSIS — E1136 Type 2 diabetes mellitus with diabetic cataract: Secondary | ICD-10-CM | POA: Insufficient documentation

## 2016-06-13 HISTORY — PX: CATARACT EXTRACTION W/PHACO: SHX586

## 2016-06-13 SURGERY — PHACOEMULSIFICATION, CATARACT, WITH IOL INSERTION
Anesthesia: Monitor Anesthesia Care | Laterality: Left | Wound class: Clean

## 2016-06-13 MED ORDER — BRIMONIDINE TARTRATE 0.2 % OP SOLN
OPHTHALMIC | Status: DC | PRN
  Administered 2016-06-13: 1 [drp] via OPHTHALMIC

## 2016-06-13 MED ORDER — LACTATED RINGERS IV SOLN: INTRAVENOUS | Status: DC

## 2016-06-13 MED ORDER — POVIDONE-IODINE 5 % OP SOLN
1.0000 "application " | OPHTHALMIC | Status: DC | PRN
  Administered 2016-06-13: 1 via OPHTHALMIC

## 2016-06-13 MED ORDER — FENTANYL CITRATE (PF) 100 MCG/2ML IJ SOLN
INTRAMUSCULAR | Status: DC | PRN
  Administered 2016-06-13: 50 ug via INTRAVENOUS

## 2016-06-13 MED ORDER — TIMOLOL MALEATE 0.5 % OP SOLN
OPHTHALMIC | Status: DC | PRN
  Administered 2016-06-13: 1 [drp] via OPHTHALMIC

## 2016-06-13 MED ORDER — EPINEPHRINE HCL 1 MG/ML IJ SOLN
INTRAOCULAR | Status: DC | PRN
  Administered 2016-06-13: 51 mL via OPHTHALMIC

## 2016-06-13 MED ORDER — ARMC OPHTHALMIC DILATING GEL
1.0000 "application " | OPHTHALMIC | Status: DC | PRN
  Administered 2016-06-13 (×2): 1 via OPHTHALMIC

## 2016-06-13 MED ORDER — CEFUROXIME OPHTHALMIC INJECTION 1 MG/0.1 ML
INJECTION | OPHTHALMIC | Status: DC | PRN
  Administered 2016-06-13: 0.1 mL via OPHTHALMIC

## 2016-06-13 MED ORDER — ACETAMINOPHEN 160 MG/5ML PO SOLN: 325.0000 mg | ORAL | Status: DC | PRN

## 2016-06-13 MED ORDER — NA HYALUR & NA CHOND-NA HYALUR 0.4-0.35 ML IO KIT
PACK | INTRAOCULAR | Status: DC | PRN
  Administered 2016-06-13: 1 mL via INTRAOCULAR

## 2016-06-13 MED ORDER — ACETAMINOPHEN 325 MG PO TABS: 325.0000 mg | ORAL_TABLET | ORAL | Status: DC | PRN

## 2016-06-13 MED ORDER — MIDAZOLAM HCL 2 MG/2ML IJ SOLN
INTRAMUSCULAR | Status: DC | PRN
Start: 2016-06-13 — End: 2016-06-13
  Administered 2016-06-13: 2 mg via INTRAVENOUS

## 2016-06-13 MED ORDER — LIDOCAINE HCL (PF) 4 % IJ SOLN
INTRAOCULAR | Status: DC | PRN
  Administered 2016-06-13: 1 mL via OPHTHALMIC

## 2016-06-13 MED ORDER — TETRACAINE HCL 0.5 % OP SOLN
1.0000 [drp] | OPHTHALMIC | Status: DC | PRN
  Administered 2016-06-13: 1 [drp] via OPHTHALMIC

## 2016-06-13 SURGICAL SUPPLY — 25 items
CANNULA ANT/CHMB 27GA (MISCELLANEOUS) ×3 IMPLANT
CARTRIDGE ABBOTT (MISCELLANEOUS) IMPLANT
GLOVE SURG LX 7.5 STRW (GLOVE) ×2
GLOVE SURG LX STRL 7.5 STRW (GLOVE) ×1 IMPLANT
GLOVE SURG TRIUMPH 8.0 PF LTX (GLOVE) ×3 IMPLANT
GOWN STRL REUS W/ TWL LRG LVL3 (GOWN DISPOSABLE) ×2 IMPLANT
GOWN STRL REUS W/TWL LRG LVL3 (GOWN DISPOSABLE) ×4
LENS IOL TECNIS ITEC 23.5 (Intraocular Lens) ×3 IMPLANT
MARKER SKIN DUAL TIP RULER LAB (MISCELLANEOUS) ×3 IMPLANT
NDL RETROBULBAR .5 NSTRL (NEEDLE) IMPLANT
NEEDLE FILTER BLUNT 18X 1/2SAF (NEEDLE) ×2
NEEDLE FILTER BLUNT 18X1 1/2 (NEEDLE) ×1 IMPLANT
PACK CATARACT BRASINGTON (MISCELLANEOUS) ×3 IMPLANT
PACK EYE AFTER SURG (MISCELLANEOUS) ×3 IMPLANT
PACK OPTHALMIC (MISCELLANEOUS) ×3 IMPLANT
RING MALYGIN 7.0 (MISCELLANEOUS) IMPLANT
SUT ETHILON 10-0 CS-B-6CS-B-6 (SUTURE)
SUT VICRYL  9 0 (SUTURE)
SUT VICRYL 9 0 (SUTURE) IMPLANT
SUTURE EHLN 10-0 CS-B-6CS-B-6 (SUTURE) IMPLANT
SYR 3ML LL SCALE MARK (SYRINGE) ×3 IMPLANT
SYR 5ML LL (SYRINGE) ×3 IMPLANT
SYR TB 1ML LUER SLIP (SYRINGE) ×3 IMPLANT
WATER STERILE IRR 250ML POUR (IV SOLUTION) ×3 IMPLANT
WIPE NON LINTING 3.25X3.25 (MISCELLANEOUS) ×3 IMPLANT

## 2016-06-13 NOTE — Transfer of Care (Signed)
Immediate Anesthesia Transfer of Care Note  Patient: Julie Mcmillan  Procedure(s) Performed: Procedure(s) with comments: CATARACT EXTRACTION PHACO AND INTRAOCULAR LENS PLACEMENT (IOC) (Left) - pre-diabetic - diet controlled  Patient Location: PACU  Anesthesia Type: MAC  Level of Consciousness: awake, alert  and patient cooperative  Airway and Oxygen Therapy: Patient Spontanous Breathing and Patient connected to supplemental oxygen  Post-op Assessment: Post-op Vital signs reviewed, Patient's Cardiovascular Status Stable, Respiratory Function Stable, Patent Airway and No signs of Nausea or vomiting  Post-op Vital Signs: Reviewed and stable  Complications: No apparent anesthesia complications

## 2016-06-13 NOTE — Op Note (Signed)
OPERATIVE NOTE  MARYLEE BELZER [id number] 06/13/2016   PREOPERATIVE DIAGNOSIS:  Nuclear sclerotic cataract left eye. H25.12   POSTOPERATIVE DIAGNOSIS:    Nuclear sclerotic cataract left eye.     PROCEDURE:  Phacoemusification with posterior chamber intraocular lens placement of the left eye   LENS:   Implant Name Type Inv. Item Serial No. Manufacturer Lot No. LRB No. Used  LENS IOL DIOP 23.5 - [id number] Intraocular Lens LENS IOL DIOP 23.5 [id number]   Left 1        ULTRASOUND TIME: 11  % of 0 minutes 52 seconds, CDE 5.9  SURGEON:  Wyonia Hough, MD   ANESTHESIA:  Topical with tetracaine drops and 2% Xylocaine jelly, augmented with 1% preservative-free intracameral lidocaine.    COMPLICATIONS:  None.   DESCRIPTION OF PROCEDURE:  The patient was identified in the holding room and transported to the operating room and placed in the supine position under the operating microscope.  The left eye was identified as the operative eye and it was prepped and draped in the usual sterile ophthalmic fashion.   A 1 millimeter clear-corneal paracentesis was made at the 1:30 position.  0.5 ml of preservative-free 1% lidocaine was injected into the anterior chamber.  The anterior chamber was filled with Viscoat viscoelastic.  A 2.4 millimeter keratome was used to make a near-clear corneal incision at the 10:30 position.  .  A curvilinear capsulorrhexis was made with a cystotome and capsulorrhexis forceps.  Balanced salt solution was used to hydrodissect and hydrodelineate the nucleus.   Phacoemulsification was then used in stop and chop fashion to remove the lens nucleus and epinucleus.  The remaining cortex was then removed using the irrigation and aspiration handpiece. Provisc was then placed into the capsular bag to distend it for lens placement.  A lens was then injected into the capsular bag.  The remaining viscoelastic was aspirated.   Wounds were hydrated with balanced salt  solution.  The anterior chamber was inflated to a physiologic pressure with balanced salt solution.  No wound leaks were noted. Cefuroxime 0.1 ml of a 61m/ml solution was injected into the anterior chamber for a dose of 1 mg of intracameral antibiotic at the completion of the case.   Timolol and Brimonidine drops were applied to the eye.  The patient was taken to the recovery room in stable condition without complications of anesthesia or surgery.  Idabelle Mcpeters 06/13/2016, 8:30 AM

## 2016-06-13 NOTE — Anesthesia Preprocedure Evaluation (Signed)
Anesthesia Evaluation  Patient identified by MRN, date of birth, ID band Patient awake    Reviewed: Allergy & Precautions, H&P , NPO status , Patient's Chart, lab work & pertinent test results  Airway Mallampati: II  TM Distance: >3 FB Neck ROM: full    Dental   Pulmonary    Pulmonary exam normal        Cardiovascular hypertension, Normal cardiovascular exam+ dysrhythmias Atrial Fibrillation      Neuro/Psych    GI/Hepatic GERD  ,  Endo/Other  diabetes, Well ControlledHypothyroidism   Renal/GU      Musculoskeletal   Abdominal   Peds  Hematology   Anesthesia Other Findings   Reproductive/Obstetrics                             Anesthesia Physical Anesthesia Plan  ASA: II  Anesthesia Plan: MAC   Post-op Pain Management:    Induction:   Airway Management Planned:   Additional Equipment:   Intra-op Plan:   Post-operative Plan:   Informed Consent: I have reviewed the patients History and Physical, chart, labs and discussed the procedure including the risks, benefits and alternatives for the proposed anesthesia with the patient or authorized representative who has indicated his/her understanding and acceptance.     Plan Discussed with:   Anesthesia Plan Comments:         Anesthesia Quick Evaluation

## 2016-06-13 NOTE — H&P (Signed)
The History and Physical notes are on paper, have been signed, and are to be scanned. The patient remains stable and unchanged from the H&P.   Previous H&P reviewed, patient examined, and there are no changes.  Julie Mcmillan 06/13/2016 7:37 AM

## 2016-06-13 NOTE — Anesthesia Postprocedure Evaluation (Signed)
Anesthesia Post Note  Patient: Julie Mcmillan  Procedure(s) Performed: Procedure(s) (LRB): CATARACT EXTRACTION PHACO AND INTRAOCULAR LENS PLACEMENT (IOC) (Left)  Patient location during evaluation: PACU Anesthesia Type: MAC Level of consciousness: awake and alert and oriented Pain management: satisfactory to patient Vital Signs Assessment: post-procedure vital signs reviewed and stable Respiratory status: spontaneous breathing, nonlabored ventilation and respiratory function stable Cardiovascular status: blood pressure returned to baseline and stable Postop Assessment: Adequate PO intake and No signs of nausea or vomiting Anesthetic complications: no    Raliegh Ip

## 2016-06-13 NOTE — Anesthesia Procedure Notes (Signed)
Procedure Name: MAC Performed by: Mayme Genta Pre-anesthesia Checklist: Patient identified, Emergency Drugs available, Suction available, Timeout performed and Patient being monitored Patient Re-evaluated:Patient Re-evaluated prior to inductionOxygen Delivery Method: Nasal cannula Placement Confirmation: positive ETCO2

## 2016-06-14 ENCOUNTER — Encounter: Payer: Self-pay | Admitting: Ophthalmology

## 2016-07-13 IMAGING — CR DG CHEST 1V PORT
1 series · 1 of 1 positions shown · non-contrast
Comparison: Chest CT 03/04/2015

CLINICAL DATA: Shortness of breath since [REDACTED].  Nonsmoker.

EXAM:
PORTABLE CHEST - 1 VIEW

[ap]
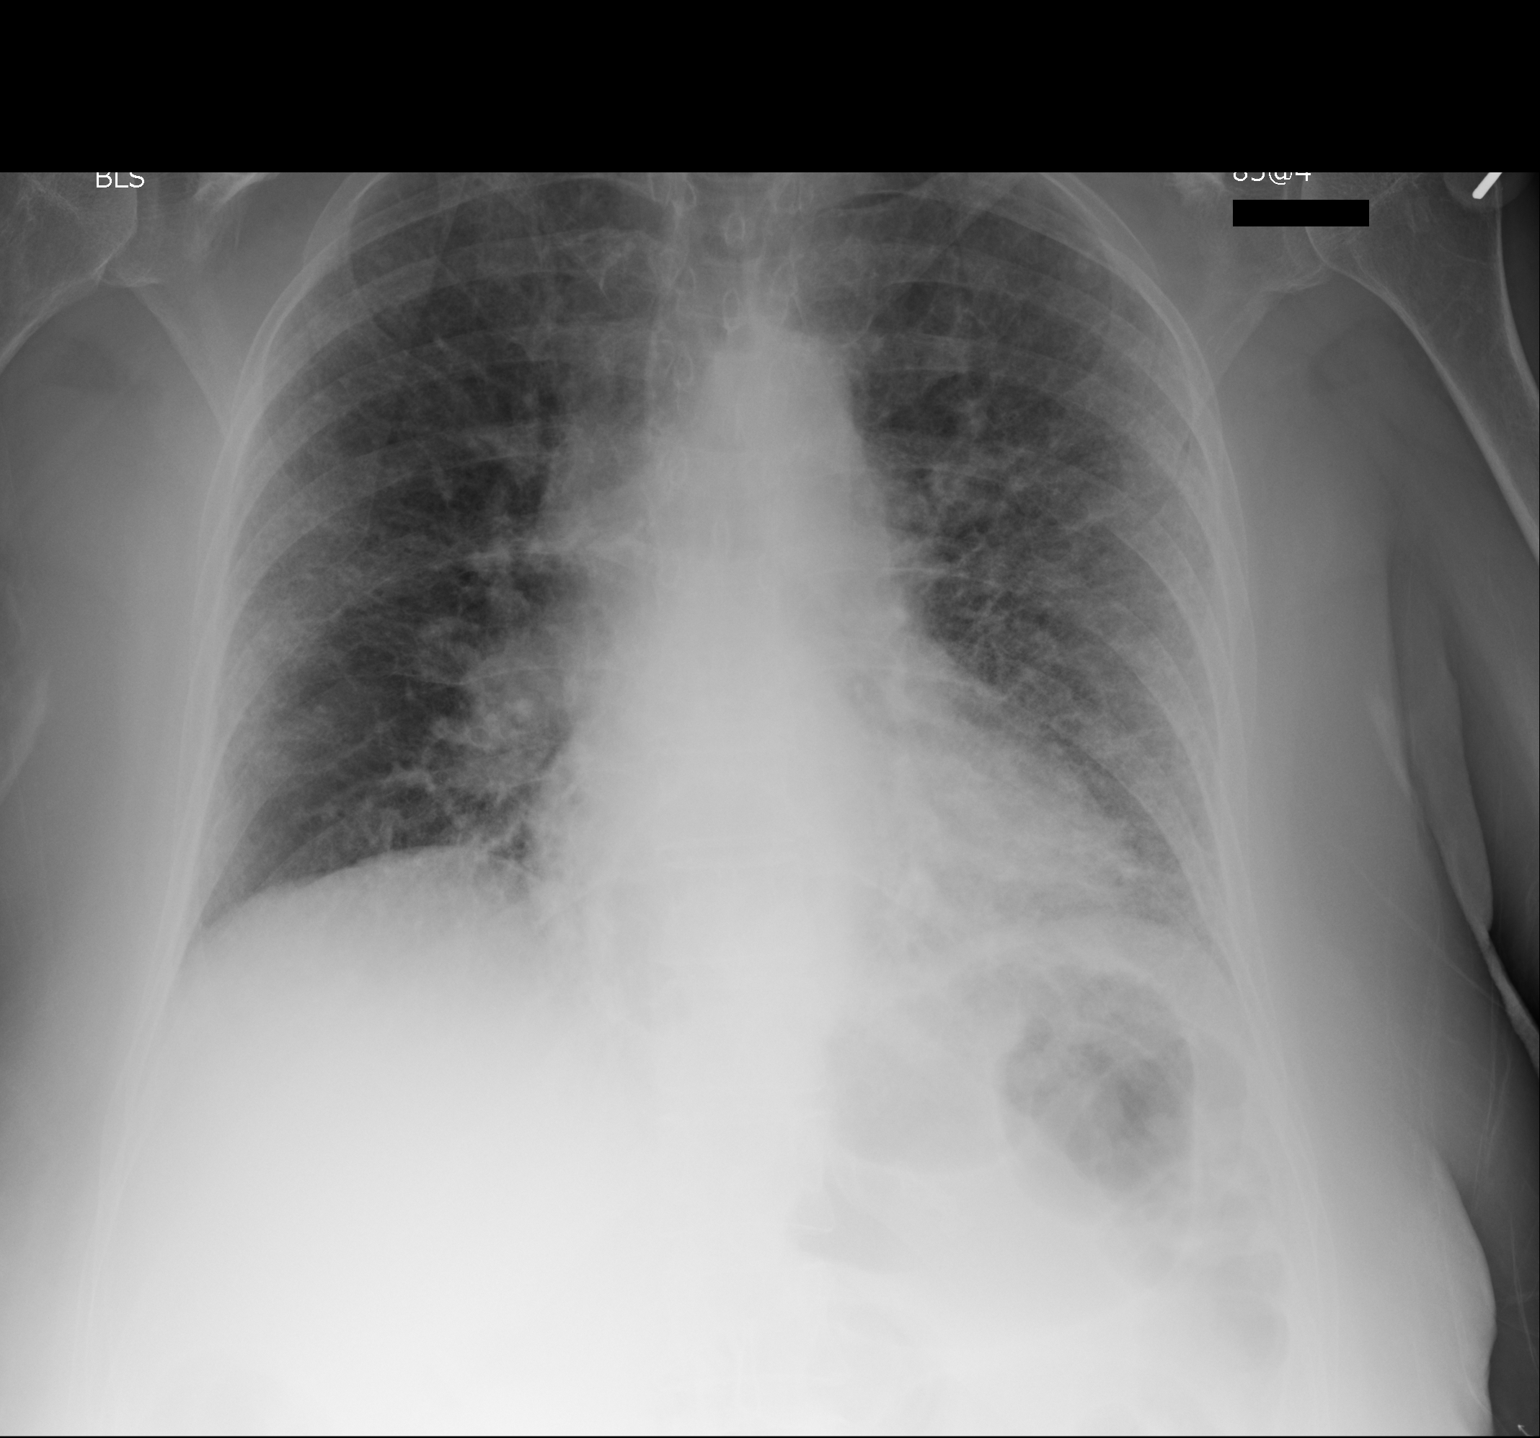

[1 of 1 positions shown; findings below may reference images not displayed]

FINDINGS: There are emphysematous changes throughout the lungs. Asymmetric
density is identified in the left lung base, raising the question of
early infiltrate. Heart size is accentuated by the technique.
IMPRESSION: 1. Emphysematous changes.
2. Asymmetric density in the left lung raising the question of early
infiltrate.

## 2016-08-31 ENCOUNTER — Emergency Department: Payer: Medicare Other

## 2016-08-31 ENCOUNTER — Encounter: Payer: Self-pay | Admitting: Emergency Medicine

## 2016-08-31 ENCOUNTER — Inpatient Hospital Stay
Admission: EM | Admit: 2016-08-31 | Discharge: 2016-09-03 | DRG: 392 | Disposition: A | Discharge status: Home / Self Care | Payer: Medicare Other | Attending: Surgery | Admitting: Surgery

## 2016-08-31 DIAGNOSIS — E119 Type 2 diabetes mellitus without complications: Secondary | ICD-10-CM | POA: Diagnosis present

## 2016-08-31 DIAGNOSIS — K862 Cyst of pancreas: Secondary | ICD-10-CM | POA: Diagnosis present

## 2016-08-31 DIAGNOSIS — J8410 Pulmonary fibrosis, unspecified: Secondary | ICD-10-CM | POA: Diagnosis present

## 2016-08-31 DIAGNOSIS — J849 Interstitial pulmonary disease, unspecified: Secondary | ICD-10-CM | POA: Diagnosis present

## 2016-08-31 DIAGNOSIS — F419 Anxiety disorder, unspecified: Secondary | ICD-10-CM | POA: Diagnosis present

## 2016-08-31 DIAGNOSIS — E1159 Type 2 diabetes mellitus with other circulatory complications: Secondary | ICD-10-CM | POA: Diagnosis present

## 2016-08-31 DIAGNOSIS — I10 Essential (primary) hypertension: Secondary | ICD-10-CM | POA: Diagnosis not present

## 2016-08-31 DIAGNOSIS — K589 Irritable bowel syndrome without diarrhea: Secondary | ICD-10-CM | POA: Diagnosis present

## 2016-08-31 DIAGNOSIS — Z7901 Long term (current) use of anticoagulants: Secondary | ICD-10-CM | POA: Diagnosis not present

## 2016-08-31 DIAGNOSIS — K219 Gastro-esophageal reflux disease without esophagitis: Secondary | ICD-10-CM | POA: Diagnosis present

## 2016-08-31 DIAGNOSIS — F329 Major depressive disorder, single episode, unspecified: Secondary | ICD-10-CM | POA: Diagnosis present

## 2016-08-31 DIAGNOSIS — E039 Hypothyroidism, unspecified: Secondary | ICD-10-CM | POA: Diagnosis present

## 2016-08-31 DIAGNOSIS — K5720 Diverticulitis of large intestine with perforation and abscess without bleeding: Secondary | ICD-10-CM | POA: Diagnosis present

## 2016-08-31 DIAGNOSIS — IMO0002 Reserved for concepts with insufficient information to code with codable children: Secondary | ICD-10-CM

## 2016-08-31 DIAGNOSIS — Z79899 Other long term (current) drug therapy: Secondary | ICD-10-CM

## 2016-08-31 DIAGNOSIS — I480 Paroxysmal atrial fibrillation: Secondary | ICD-10-CM | POA: Diagnosis present

## 2016-08-31 DIAGNOSIS — K810 Acute cholecystitis: Secondary | ICD-10-CM | POA: Diagnosis present

## 2016-08-31 DIAGNOSIS — R109 Unspecified abdominal pain: Secondary | ICD-10-CM | POA: Diagnosis present

## 2016-08-31 LAB — URINALYSIS COMPLETE WITH MICROSCOPIC (ARMC ONLY)
BILIRUBIN URINE: NEGATIVE
Bacteria, UA: NONE SEEN
GLUCOSE, UA: NEGATIVE mg/dL
Hgb urine dipstick: NEGATIVE
Ketones, ur: NEGATIVE mg/dL
Nitrite: NEGATIVE
PH: 5 (ref 5.0–8.0)
Protein, ur: NEGATIVE mg/dL
Specific Gravity, Urine: 1.017 (ref 1.005–1.030)

## 2016-08-31 LAB — COMPREHENSIVE METABOLIC PANEL
ALBUMIN: 4 g/dL (ref 3.5–5.0)
ALK PHOS: 83 U/L (ref 38–126)
ALT: 11 U/L — AB (ref 14–54)
AST: 20 U/L (ref 15–41)
Anion gap: 8 (ref 5–15)
BUN: 13 mg/dL (ref 6–20)
CALCIUM: 9 mg/dL (ref 8.9–10.3)
CHLORIDE: 104 mmol/L (ref 101–111)
CO2: 26 mmol/L (ref 22–32)
CREATININE: 0.79 mg/dL (ref 0.44–1.00)
GFR calc Af Amer: >60 mL/min (ref >60)
GFR calc non Af Amer: >60 mL/min (ref >60)
GLUCOSE: 137 mg/dL — AB (ref 65–99)
Potassium: 3.9 mmol/L (ref 3.5–5.1)
SODIUM: 138 mmol/L (ref 135–145)
Total Bilirubin: 0.8 mg/dL (ref 0.3–1.2)
Total Protein: 6.7 g/dL (ref 6.5–8.1)

## 2016-08-31 LAB — CBC
HCT: 39.7 % (ref 35.0–47.0)
Hemoglobin: 13.2 g/dL (ref 12.0–16.0)
MCH: 28.3 pg (ref 26.0–34.0)
MCHC: 33.3 g/dL (ref 32.0–36.0)
MCV: 85.1 fL (ref 80.0–100.0)
Platelets: 292 10*3/uL (ref 150–440)
RBC: 4.67 MIL/uL (ref 3.80–5.20)
RDW: 13.6 % (ref 11.5–14.5)
WBC: 12.6 10*3/uL — ABNORMAL HIGH (ref 3.6–11.0)

## 2016-08-31 LAB — LIPASE, BLOOD: Lipase: 16 U/L (ref 11–51)

## 2016-08-31 MED ORDER — PANTOPRAZOLE SODIUM 40 MG IV SOLR
40.0000 mg | Freq: Every day | INTRAVENOUS | Status: DC
  Administered 2016-08-31 – 2016-09-01 (×2): 40 mg via INTRAVENOUS
  Filled 2016-08-31: qty 40

## 2016-08-31 MED ORDER — ENOXAPARIN SODIUM 40 MG/0.4ML ~~LOC~~ SOLN
40.0000 mg | SUBCUTANEOUS | Status: DC
  Administered 2016-08-31 – 2016-09-02 (×3): 40 mg via SUBCUTANEOUS
  Filled 2016-08-31 (×2): qty 0.4

## 2016-08-31 MED ORDER — PIPERACILLIN-TAZOBACTAM 3.375 G IVPB
INTRAVENOUS | Status: AC
  Administered 2016-08-31: 3.375 g via INTRAVENOUS
  Filled 2016-08-31: qty 50

## 2016-08-31 MED ORDER — KETOROLAC TROMETHAMINE 30 MG/ML IJ SOLN: 15.0000 mg | Freq: Four times a day (QID) | INTRAMUSCULAR | Status: DC | PRN

## 2016-08-31 MED ORDER — PANTOPRAZOLE SODIUM 40 MG IV SOLR
INTRAVENOUS | Status: AC
  Filled 2016-08-31: qty 40

## 2016-08-31 MED ORDER — IOPAMIDOL (ISOVUE-300) INJECTION 61%
100.0000 mL | Freq: Once | INTRAVENOUS | Status: AC | PRN
  Administered 2016-08-31: 100 mL via INTRAVENOUS
  Filled 2016-08-31: qty 100

## 2016-08-31 MED ORDER — ONDANSETRON 4 MG PO TBDP: 4.0000 mg | ORAL_TABLET | Freq: Four times a day (QID) | ORAL | Status: DC | PRN

## 2016-08-31 MED ORDER — HYDRALAZINE HCL 20 MG/ML IJ SOLN
10.0000 mg | INTRAMUSCULAR | Status: DC | PRN
Start: 2016-08-31 — End: 2016-09-03

## 2016-08-31 MED ORDER — LEVOTHYROXINE SODIUM 50 MCG PO TABS
50.0000 ug | ORAL_TABLET | Freq: Every day | ORAL | Status: DC
  Administered 2016-09-01 – 2016-09-03 (×3): 50 ug via ORAL
  Filled 2016-08-31 (×3): qty 1

## 2016-08-31 MED ORDER — IPRATROPIUM-ALBUTEROL 0.5-2.5 (3) MG/3ML IN SOLN: 3.0000 mL | RESPIRATORY_TRACT | Status: DC | PRN

## 2016-08-31 MED ORDER — FLUOXETINE HCL 20 MG PO CAPS
20.0000 mg | ORAL_CAPSULE | Freq: Every day | ORAL | Status: DC
  Administered 2016-09-01 – 2016-09-03 (×3): 20 mg via ORAL
  Filled 2016-08-31 (×3): qty 1

## 2016-08-31 MED ORDER — PIPERACILLIN-TAZOBACTAM 3.375 G IVPB
3.3750 g | Freq: Three times a day (TID) | INTRAVENOUS | Status: DC
  Administered 2016-08-31 – 2016-09-02 (×5): 3.375 g via INTRAVENOUS
  Filled 2016-08-31 (×4): qty 50

## 2016-08-31 MED ORDER — PIPERACILLIN-TAZOBACTAM 3.375 G IVPB 30 MIN
3.3750 g | Freq: Once | INTRAVENOUS | Status: AC
  Administered 2016-08-31: 3.375 g via INTRAVENOUS

## 2016-08-31 MED ORDER — ACETAMINOPHEN 500 MG PO TABS
ORAL_TABLET | ORAL | Status: AC
  Filled 2016-08-31: qty 2

## 2016-08-31 MED ORDER — ACETAMINOPHEN 500 MG PO TABS
1000.0000 mg | ORAL_TABLET | Freq: Four times a day (QID) | ORAL | Status: DC
  Administered 2016-08-31 – 2016-09-03 (×9): 1000 mg via ORAL
  Filled 2016-08-31 (×9): qty 2

## 2016-08-31 MED ORDER — LACTATED RINGERS IV SOLN
INTRAVENOUS | Status: DC
  Administered 2016-08-31 – 2016-09-02 (×4): via INTRAVENOUS

## 2016-08-31 MED ORDER — ENOXAPARIN SODIUM 40 MG/0.4ML ~~LOC~~ SOLN
SUBCUTANEOUS | Status: AC
  Filled 2016-08-31: qty 0.4

## 2016-08-31 MED ORDER — METOPROLOL SUCCINATE ER 50 MG PO TB24
50.0000 mg | ORAL_TABLET | Freq: Every day | ORAL | Status: DC
  Administered 2016-09-01 – 2016-09-03 (×3): 50 mg via ORAL
  Filled 2016-08-31 (×3): qty 1

## 2016-08-31 MED ORDER — IOPAMIDOL (ISOVUE-300) INJECTION 61%
30.0000 mL | Freq: Once | INTRAVENOUS | Status: AC | PRN
  Administered 2016-08-31: 30 mL via ORAL
  Filled 2016-08-31: qty 30

## 2016-08-31 MED ORDER — PIPERACILLIN-TAZOBACTAM 3.375 G IVPB
INTRAVENOUS | Status: AC
  Filled 2016-08-31: qty 50

## 2016-08-31 MED ORDER — ONDANSETRON HCL 4 MG/2ML IJ SOLN: 4.0000 mg | Freq: Four times a day (QID) | INTRAMUSCULAR | Status: DC | PRN

## 2016-08-31 NOTE — Progress Notes (Signed)
Pharmacy Antibiotic Note  Julie Mcmillan is a 75 y.o. female admitted on 08/31/2016 with diverticulitis.  Pharmacy has been consulted for Zosyn dosing.  Plan: Zosyn 3.375g IV q8h (4 hour infusion).  Height: 5' 4" (162.6 cm) Weight: 150 lb (68 kg) IBW/kg (Calculated) : 54.7  Temp (24hrs), Avg:98.2 F (36.8 C), Min:98 F (36.7 C), Max:98.3 F (36.8 C)   Recent Labs Lab 08/31/16 1729  WBC 12.6*  CREATININE 0.79    Estimated Creatinine Clearance: 56.7 mL/min (by C-G formula based on SCr of 0.79 mg/dL).    Allergies  Allergen Reactions  . Amlodipine Swelling  . Nexium [Esomeprazole Magnesium] Diarrhea    Antimicrobials this admission: Zosyn 12/1 >>    >>   Dose adjustments this admission:   Microbiology results:  12/1 UCx: pending     12/1 UA: LE(+) NO2(-) WBC 6-30  Thank you for allowing pharmacy to be a part of this patient's care.  Calib Wadhwa S 08/31/2016 11:27 PM

## 2016-08-31 NOTE — ED Notes (Signed)
Admitting MD at bedside.

## 2016-08-31 NOTE — ED Notes (Signed)
Pt in via triage with complaints of generalized abdominal pain x 3 days.  Pt denies any N/V/D.  Pt reports hx of diverticulitis.  Pt A/Ox4, no immediate distress noted at this time.

## 2016-08-31 NOTE — ED Notes (Signed)
Pt unable to provide urine sample at this time.

## 2016-08-31 NOTE — ED Provider Notes (Addendum)
[facility] Emergency Department Provider Note  ____________________________________________   I have reviewed the triage vital signs and the nursing notes.   HISTORY  Chief Complaint Abdominal Pain    HPI Julie Mcmillan is a 75 y.o. female resents today with [place] right lower quadrant although also left abdominal discomfort for the last 2-3 days. No other symptoms no fever no chills or nausea no vomiting or diarrhea no melena bright red blood per rectum hematemesis. Patient does have a history of chronic cough "20 years" and has a slight cough which she feels is her baseline. She states that she has a recurrent history of diverticulitis and this feels a diverticulitis although is more on the right than the left than normal. Patient does have a history of appendicitis with appendectomy as a child. She has no other complaints at this time.     Past Medical History:  Diagnosis Date  . A-fib ([place])   . Chronic diarrhea   . Diverticulitis   . GERD (gastroesophageal reflux disease)    usually takes protonix  . Hip fracture ([place]) 11/2015   hairline fracture on right.  no surgery, just physical therapy  . Hypertension   . IBS (irritable bowel syndrome)   . IBS (irritable bowel syndrome)   . Interstitial lung disease ([place])    does not remember when  . Seasonal affective disorder ([place])   . Shortness of breath dyspnea    with exertion  . Type 2 diabetes mellitus ([place]) 11/04/2014    Patient Active Problem List   Diagnosis Date Noted  . Acute bronchitis 11/29/2015  . Acute respiratory failure with hypoxia ([place]) 11/29/2015  . Fracture of greater trochanter of right femur ([place]) 11/29/2015  . Fall 11/26/2015  . OP (osteoporosis) 07/18/2015  . 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 Adult hypothyroidism 07/18/2015  . BP (high blood pressure) 07/18/2015  . Fatigue 07/18/2015  . Anxiety 07/18/2015  . Diverticulitis large intestine  07/04/2015  . Paroxysmal atrial fibrillation ([place]) 04/22/2015  . Clinical depression 04/22/2015  . Atrial fibrillation with rapid ventricular response ([place]) 03/25/2015  . Atrial fibrillation ([place]) 03/25/2015  . Acquired hypothyroidism 01/11/2015  . Anemia, iron deficiency 01/10/2015  . Benign essential HTN 01/10/2015  . Type 2 diabetes mellitus ([place]) 11/04/2014  . 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 Essential (primary) hypertension 11/04/2014  . Temporary cerebral vascular dysfunction 04/08/2014  . Dry mouth 01/05/2013  . Cannot sleep 01/05/2013  . Obstructive apnea 09/02/2012  . Bursitis, ischial 05/19/2012  . Acid reflux 02/01/2012  . DD (diverticular disease) 08/14/2011    Past Surgical History:  Procedure Laterality Date  . ABDOMINAL HYSTERECTOMY     partial  . APPENDECTOMY    . CATARACT EXTRACTION W/PHACO Right 03/16/2015   Procedure: CATARACT EXTRACTION PHACO AND INTRAOCULAR LENS PLACEMENT (IOC);  Surgeon: Leandrew Koyanagi, MD;  Location: [place];  Service: Ophthalmology;  Laterality: Right;  . CATARACT EXTRACTION W/PHACO Left 06/13/2016   Procedure: CATARACT EXTRACTION PHACO AND INTRAOCULAR LENS PLACEMENT (IOC);  Surgeon: Leandrew Koyanagi, MD;  Location: [place];  Service: Ophthalmology;  Laterality: Left;  pre-diabetic - diet controlled  . COLONOSCOPY    . COLONOSCOPY WITH PROPOFOL N/A 05/25/2016   Procedure: COLONOSCOPY WITH PROPOFOL;  Surgeon: Manya Silvas, MD;  [facility] ENDOSCOPY;  Service: Endoscopy;  Laterality: N/A;  . ESOPHAGOGASTRODUODENOSCOPY (EGD) WITH PROPOFOL N/A 05/25/2016   Procedure: ESOPHAGOGASTRODUODENOSCOPY (EGD) WITH PROPOFOL;  Surgeon: Manya Silvas, MD;  Location: [facility] ENDOSCOPY;  Service: Endoscopy;  Laterality: N/A;  . NISSEN FUNDOPLICATION  1610   came apart in 2016 and ibs and reflux returned  . OPEN REDUCTION INTERNAL FIXATION (ORIF) DISTAL RADIAL FRACTURE Right 01/13/2016    Procedure: OPEN REDUCTION INTERNAL FIXATION (ORIF) DISTAL RADIAL FRACTURE;  Surgeon: Hessie Knows, MD;  Location: [facility] ORS;  Service: Orthopedics;  Laterality: Right;  . TONSILLECTOMY     and adenoidectomy    Prior to Admission medications   Medication Sig Start Date End Date Taking? Authorizing Provider  apixaban (ELIQUIS) 5 MG TABS tablet Take 5 mg by mouth 2 (two) times daily.    Historical Provider, MD  Calcium Carbonate-Vitamin D (CALCIUM 600+D) 600-200 MG-UNIT TABS Take 1 tablet by mouth daily.    Historical Provider, MD  cholestyramine Lucrezia Starch) 4 GM/DOSE powder Take 1 packet by mouth 4 (four) times daily. 09/12/15   Historical Provider, MD  diltiazem (CARDIZEM CD) 120 MG 24 hr capsule Take 1 capsule (120 mg total) by mouth daily. Patient not taking: Reported on 06/06/2016 11/29/15   Hillary Bow, MD  FLUoxetine (PROZAC) 10 MG tablet Take 10 mg by mouth daily.    Historical Provider, MD  hydrALAZINE (APRESOLINE) 25 MG tablet Take 25 mg by mouth 3 (three) times daily.    Historical Provider, MD  ipratropium-albuterol (DUONEB) 0.5-2.5 (3) MG/3ML SOLN Take 3 mLs by nebulization every 4 (four) hours as needed. 11/29/15   Srikar Sudini, MD  levothyroxine (SYNTHROID, LEVOTHROID) 50 MCG tablet Take 50 mcg by mouth daily before breakfast.    Historical Provider, MD  losartan (COZAAR) 100 MG tablet Take 100 mg by mouth daily.     Historical Provider, MD  metoprolol (LOPRESSOR) 100 MG tablet Take 1 tablet by mouth 2 (two) times daily. 09/07/15   Historical Provider, MD  Multiple Vitamin (MULTIVITAMIN WITH MINERALS) TABS tablet Take 1 tablet by mouth daily.    Historical Provider, MD  ondansetron (ZOFRAN ODT) 4 MG disintegrating tablet Take 1 tablet (4 mg total) by mouth every 8 (eight) hours as needed for nausea or vomiting. 11/15/15   Loney Hering, MD  pantoprazole (PROTONIX) 40 MG tablet Take 1 tablet (40 mg total) by mouth daily. 03/29/15   Glendon Axe, MD  sotalol (BETAPACE) 80 MG tablet Take  1 tablet (80 mg total) by mouth every 12 (twelve) hours. 03/29/15   Glendon Axe, MD  venlafaxine XR (EFFEXOR-XR) 150 MG 24 hr capsule Take 1 capsule (150 mg total) by mouth daily with lunch. 03/29/15   Glendon Axe, MD    Allergies Amlodipine and Nexium [esomeprazole magnesium]  Family History  Problem Relation Age of Onset  . Breast cancer Mother   . Diabetes Mellitus II Mother   . Hypothyroidism Mother   . Atrial fibrillation Mother   . Heart attack Father     Social History Social History  Substance Use Topics  . Smoking status: Never Smoker  . Smokeless tobacco: Never Used     Comment: no passive smokers in home  . Alcohol use No    Review of Systems Constitutional: No fever/chills Eyes: No visual changes. ENT: No sore throat. No stiff neck no neck pain Cardiovascular: Denies chest pain. Respiratory: Denies shortness of breath. Gastrointestinal:   no vomiting.  No diarrhea.  No constipation. Genitourinary: Negative for dysuria. Musculoskeletal: Negative lower extremity swelling Skin: Negative for rash. Neurological: Negative for severe headaches, focal weakness or numbness. 10-point ROS otherwise negative.  ____________________________________________   PHYSICAL EXAM:  VITAL SIGNS: ED  Triage Vitals  Enc Vitals Group     BP 08/31/16 1713 (!) 119/59     Pulse Rate 08/31/16 1713 64     Resp --      Temp 08/31/16 1713 98.3 F (36.8 C)     Temp Source 08/31/16 1713 Oral     SpO2 08/31/16 1713 97 %     Weight 08/31/16 1714 150 lb (68 kg)     Height 08/31/16 1714 _0  (1.626 m)     Head Circumference --      Peak Flow --      Pain Score 08/31/16 1715 5     Pain Loc --      Pain Edu? --      Excl. in [place]? --     Constitutional: Alert and oriented. Well appearing and in no acute distress. Eyes: Conjunctivae are normal. PERRL. EOMI. Head: Atraumatic. Nose: No congestion/rhinnorhea. Mouth/Throat: Mucous membranes are moist.  Oropharynx  non-erythematous. Neck: No stridor.   Nontender with no meningismus Cardiovascular: Normal rate, regular rhythm. Grossly normal heart sounds.  Good peripheral circulation. Respiratory: Normal respiratory effort.  No retractions. Lungs Very slightly coarse but no wheeze rales or rhonchi. Abdominal: Soft and Diffusely tender right greater than left, lower quadrant, voluntary guarding but no rebound nonsurgical abdomen. No distention. Bowel sounds. Normal Back:  There is no focal tenderness or step off.  there is no midline tenderness there are no lesions noted. there is no CVA tenderness musculoskeletal: No lower extremity tenderness, no upper extremity tenderness. No joint effusions, no DVT signs strong distal pulses no edema Neurologic:  Normal speech and language. No gross focal neurologic deficits are appreciated.  Skin:  Skin is warm, dry and intact. No rash noted. Psychiatric: Mood and affect are normal. Speech and behavior are normal.  ____________________________________________   LABS (all labs ordered are listed, but only abnormal results are displayed)  Labs Reviewed  COMPREHENSIVE METABOLIC PANEL - Abnormal; Notable for the following:       Result Value   Glucose, Bld 137 (*)    ALT 11 (*)    All other components within normal limits  CBC - Abnormal; Notable for the following:    WBC 12.6 (*)    All other components within normal limits  URINALYSIS COMPLETEWITH MICROSCOPIC ([facility] ONLY) - Abnormal; Notable for the following:    Color, Urine YELLOW (*)    APPearance CLEAR (*)    Leukocytes, UA 1+ (*)    Squamous Epithelial / LPF 0-5 (*)    All other components within normal limits  URINE CULTURE  LIPASE, BLOOD   ____________________________________________  EKG  I personally interpreted any EKGs ordered by me or triage  ____________________________________________  RADIOLOGY  I reviewed any imaging ordered by me or triage that were performed during my shift and, if  possible, patient and/or family made aware of any abnormal findings. ____________________________________________   PROCEDURES  Procedure(s) performed: None  Procedures  Critical Care performed: None  ____________________________________________   INITIAL IMPRESSION / ASSESSMENT AND PLAN / ED COURSE  Pertinent labs & imaging results that were available during my care of the patient were reviewed by me and considered in my medical decision making (see chart for details).  Patient with very reproducible lower abdominal discomfort, differential is most likely diverticulitis given her age we'll obtain CT to evaluate. Her lungs sound slightly coarse but this is likely her baseline, we will obtain a chest x-ray as a precaution.  ----------------------------------------- 8:32 PM on  08/31/2016 -----------------------------------------  CT shows a small abscess, we have paged surgery. We'll give her Zosyn. She will need admission for this likely. 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 I have already recommended that she must follow up as an outpatient with her primary care doctor for this after discharge.  Clinical Course    ____________________________________________   FINAL CLINICAL IMPRESSION(S) / ED DIAGNOSES  Final diagnoses:  None      This chart was dictated using voice recognition software.  Despite best efforts to proofread,  errors can occur which can change meaning.      Schuyler Amor, MD 08/31/16 [place], MD 08/31/16 2033

## 2016-08-31 NOTE — ED Triage Notes (Signed)
Patient presents to the ED with abdominal pain x several days.  Patient states, "I think I have diverticulitis.  My stomach really hurts when I cough."  Patient is in no obvious distress during triage.  Patient is alert and oriented x 4. Denies vomiting and denies diarrhea.  Patient's skin is warm and dry.

## 2016-08-31 NOTE — H&P (Signed)
Julie Mcmillan is an 75 y.o. female.   Chief Complaint: abdominal pain HPI: 75 yr old female with multiple medical issues including Pulmonary fibrosis, Afib on [place], HTN, Depression, anxiety, and history of diverticulitis comes to the ED today with complaint of abdominal pain.  The patient states that over the past few weeks she's been having increasing shortness of breath and fatigue which she attributed to her pulmonary fibrosis worsening and has an appointment next week with her regular pulmonologist. The patient stated that approximately 2 days ago she began having lower abdominal pain across both sides of her lower abdomen that would come and go in waves. The patient states that the pain is about a 7 out of 10 pain and in the past but more constant and more acute onset of pain. The patient states that this time the pain has not stayed constant the whole time and she also has noted that she does have some bloating but not the same amount of hardness that she usually has in the lower abdomen with her diverticulitis and the patient states that she has felt some fever and some malaise and some fatigue that her bowel movements have been normal. The patient does state that she had some corn for the first time in about 10 months which is outside of her dietary restrictions for the diverticulitis. The patient states that she did this approximately 3 days ago and then began having the pain the next day. Otherwise she has had no acute changes and no weight loss has felt fatigued but no other symptoms from a long-term standpoint and then the pain came on 2 days ago. Patient denies any nausea or vomiting any constipation diarrhea or any dysuria hematuria urgency or frequency with urination.   Past Medical History:  Diagnosis Date  . A-fib ([place])   . Chronic diarrhea   . Diverticulitis   . GERD (gastroesophageal reflux disease)    usually takes protonix  . Hip fracture ([place]) 11/2015   hairline fracture on  right.  no surgery, just physical therapy  . Hypertension   . IBS (irritable bowel syndrome)   . IBS (irritable bowel syndrome)   . Interstitial lung disease ([place])    does not remember when  . Seasonal affective disorder ([employer])   . Shortness of breath dyspnea    with exertion  . Type 2 diabetes mellitus ([place]) 11/04/2014    Past Surgical History:  Procedure Laterality Date  . ABDOMINAL HYSTERECTOMY     partial  . APPENDECTOMY    . CATARACT EXTRACTION W/PHACO Right 03/16/2015   Procedure: CATARACT EXTRACTION PHACO AND INTRAOCULAR LENS PLACEMENT (IOC);  Surgeon: Leandrew Koyanagi, MD;  Location: [facility];  Service: Ophthalmology;  Laterality: Right;  . CATARACT EXTRACTION W/PHACO Left 06/13/2016   Procedure: CATARACT EXTRACTION PHACO AND INTRAOCULAR LENS PLACEMENT (IOC);  Surgeon: Leandrew Koyanagi, MD;  Location: [facility];  Service: Ophthalmology;  Laterality: Left;  pre-diabetic - diet controlled  . COLONOSCOPY    . COLONOSCOPY WITH PROPOFOL N/A 05/25/2016   Procedure: COLONOSCOPY WITH PROPOFOL;  Surgeon: Manya Silvas, MD;  [facility];  Service: Endoscopy;  Laterality: N/A;  . ESOPHAGOGASTRODUODENOSCOPY (EGD) WITH PROPOFOL N/A 05/25/2016   Procedure: ESOPHAGOGASTRODUODENOSCOPY (EGD) WITH PROPOFOL;  Surgeon: Manya Silvas, MD;  Location: [facility] ENDOSCOPY;  Service: Endoscopy;  Laterality: N/A;  . NISSEN FUNDOPLICATION  [id number]   came apart in 2016 and ibs and reflux returned  . OPEN REDUCTION INTERNAL FIXATION (ORIF) DISTAL RADIAL FRACTURE  Right 01/13/2016   Procedure: OPEN REDUCTION INTERNAL FIXATION (ORIF) DISTAL RADIAL FRACTURE;  Surgeon: Hessie Knows, MD;  Location: [facility] ORS;  Service: Orthopedics;  Laterality: Right;  . TONSILLECTOMY     and adenoidectomy    Family History  Problem Relation Age of Onset  . Breast cancer Mother   . Diabetes Mellitus II Mother   . Hypothyroidism Mother   . Atrial fibrillation Mother   . Heart attack Father     Social History:  reports that she has never smoked. She has never used smokeless tobacco. She reports that she does not drink alcohol. Her drug history is not on file.  Allergies:  Allergies  Allergen Reactions  . Amlodipine Swelling  . Nexium [Esomeprazole Magnesium] Diarrhea     (Not in a hospital admission)  Results for orders placed or performed during the hospital encounter of 08/31/16 (from the past 48 hour(s))  Lipase, blood     Status: None   Collection Time: 08/31/16  5:29 PM  Result Value Ref Range   Lipase 16 11 - 51 U/L  Comprehensive metabolic panel     Status: Abnormal   Collection Time: 08/31/16  5:29 PM  Result Value Ref Range   Sodium 138 135 - 145 mmol/L   Potassium 3.9 3.5 - 5.1 mmol/L   Chloride 104 101 - 111 mmol/L   CO2 26 22 - 32 mmol/L   Glucose, Bld 137 (H) 65 - 99 mg/dL   BUN 13 6 - 20 mg/dL   Creatinine, Ser 0.79 0.44 - 1.00 mg/dL   Calcium 9.0 8.9 - 10.3 mg/dL   Total Protein 6.7 6.5 - 8.1 g/dL   Albumin 4.0 3.5 - 5.0 g/dL   AST 20 15 - 41 U/L   ALT 11 (L) 14 - 54 U/L   Alkaline Phosphatase 83 38 - 126 U/L   Total Bilirubin 0.8 0.3 - 1.2 mg/dL   GFR calc non Af Amer >60 >60 mL/min   GFR calc Af Amer >60 >60 mL/min    Comment: (NOTE) The eGFR has been calculated using the CKD EPI equation. This calculation has not been validated in all clinical situations. eGFR's persistently <60 mL/min signify possible Chronic Kidney Disease.    Anion gap 8 5 - 15  CBC     Status: Abnormal   Collection Time: 08/31/16  5:29 PM  Result Value Ref Range   WBC 12.6 (H) 3.6 - 11.0 K/uL   RBC 4.67 3.80 - 5.20 MIL/uL   Hemoglobin 13.2 12.0 - 16.0 g/dL   HCT 39.7 35.0 - 47.0 %   MCV 85.1 80.0 - 100.0 fL   MCH 28.3 26.0 - 34.0 pg   MCHC 33.3 32.0 - 36.0 g/dL   RDW 13.6 11.5 - 14.5 %   Platelets 292 150 - 440 K/uL  Urinalysis complete, with microscopic     Status: Abnormal   Collection Time: 08/31/16  5:59 PM  Result Value Ref Range   Color, Urine YELLOW  (A) YELLOW   APPearance CLEAR (A) CLEAR   Glucose, UA NEGATIVE NEGATIVE mg/dL   Bilirubin Urine NEGATIVE NEGATIVE   Ketones, ur NEGATIVE NEGATIVE mg/dL   Specific Gravity, Urine 1.017 1.005 - 1.030   Hgb urine dipstick NEGATIVE NEGATIVE   pH 5.0 5.0 - 8.0   Protein, ur NEGATIVE NEGATIVE mg/dL   Nitrite NEGATIVE NEGATIVE   Leukocytes, UA 1+ (A) NEGATIVE   RBC / HPF 0-5 0 - 5 RBC/hpf   WBC, UA 6-30 0 -  5 WBC/hpf   Bacteria, UA NONE SEEN NONE SEEN   Squamous Epithelial / LPF 0-5 (A) NONE SEEN   Mucous PRESENT    Hyaline Casts, UA PRESENT    Dg Chest 2 View  Result Date: 08/31/2016 CLINICAL DATA:  75 y/o F; abdominal pain and concern for diverticulitis. EXAM: CHEST  2 VIEW COMPARISON:  11/27/2015 chest radiograph.  07/08/2015 CT chest. FINDINGS: Stable cardiac silhouette within normal limits. Coarse reticular markings of the lungs is compatible with interstitial lung disease. No focal consolidation. No pleural effusion. Stable mid thoracic compression deformity. Bones are otherwise unremarkable. IMPRESSION: No active cardiopulmonary disease. Electronically Signed   By: Kristine Garbe M.D.   On: 08/31/2016 18:52   Ct Abdomen Pelvis W Contrast  Result Date: 08/31/2016 CLINICAL DATA:  Abdominal pain for several days. Concern for diverticulitis. History of diverticulitis, hysterectomy, appendectomy and cholecystectomy. EXAM: CT ABDOMEN AND PELVIS WITH CONTRAST TECHNIQUE: Multidetector CT imaging of the abdomen and pelvis was performed using the standard protocol following bolus administration of intravenous contrast. CONTRAST:  172m ISOVUE-300 IOPAMIDOL (ISOVUE-300) INJECTION 61% COMPARISON:  CT abdomen and pelvis September 23, 2016 FINDINGS: LOWER CHEST: Similar fibrotic changes in the lung bases. The heart size is normal. Prominent RIGHT ventricle and atrium is unchanged. No pericardial effusions. HEPATOBILIARY: Mild diffuse heterogeneous enhancement of the liver could be secondary to  bolus timing, liver is otherwise unremarkable. Status post cholecystectomy. PANCREAS: New 12 mm cystic mass proximal body (axial 32/97). SPLEEN: Normal. ADRENALS/URINARY TRACT: Kidneys are orthotopic, demonstrating symmetric enhancement. No nephrolithiasis, hydronephrosis or solid renal masses. Multifocal cortical scarring bilaterally. The unopacified ureters are normal in course and caliber. Delayed imaging through the kidneys demonstrates symmetric prompt contrast excretion within the proximal urinary collecting system. Urinary bladder is partially distended and unremarkable. Normal adrenal glands. STOMACH/BOWEL: Severe sigmoid diverticulosis, at site of prior acute diverticulitis and phlegmon is now 8 2 x 2.2 cm fluid collection with gas, axial 75/97. No bowel obstruction. Moderate amount of retained large bowel stool. Stomach is normal. VASCULAR/LYMPHATIC: Aortoiliac vessels are normal in course and caliber, mild calcific atherosclerosis. No lymphadenopathy by CT size criteria. REPRODUCTIVE: Status post hysterectomy. OTHER: No intraperitoneal free fluid or free air. MUSCULOSKELETAL: Nonacute. Osteopenia. Old T8 severe compression fracture. IMPRESSION: 2 x 2.2 cm sigmoid colon abscess with gas, suggesting fistula versus gas-forming organism. Severe sigmoid diverticulosis. **An incidental finding of potential clinical significance has been found. 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 Recommend close outpatient follow-up, and consider MRI for further characterization on a nonemergent basis.** Acute findings discussed with and reconfirmed by Dr.JAMES MCSHANE on 08/31/2016 at 8:16 pm. Electronically Signed   By: CElon AlasM.D.   On: 08/31/2016 20:18    Review of Systems  Constitutional: Positive for chills, fever and malaise/fatigue. Negative for diaphoresis and weight loss.  HENT: Negative for congestion, hearing loss and sore throat.   Respiratory: Negative for cough, sputum production, shortness of breath and  wheezing.   Cardiovascular: Negative for chest pain, palpitations, orthopnea, claudication and leg swelling.  Gastrointestinal: Positive for abdominal pain. Negative for blood in stool, constipation, diarrhea, heartburn, nausea and vomiting.  Genitourinary: Negative for dysuria, flank pain, frequency, hematuria and urgency.  Musculoskeletal: Negative for back pain, joint pain and neck pain.  Skin: Negative for itching and rash.  Neurological: Positive for weakness. Negative for dizziness and loss of consciousness.  Psychiatric/Behavioral: Negative for depression. The patient is not nervous/anxious.   All other systems reviewed and are negative.   Blood pressure (!) 119/59, pulse  64, temperature 98.3 F (36.8 C), temperature source Oral, height _0  (1.626 m), weight 150 lb (68 kg), SpO2 97 %. Physical Exam  Vitals reviewed. Constitutional: She is oriented to person, place, and time. She appears well-developed and well-nourished. No distress.  HENT:  Head: Normocephalic and atraumatic.  Right Ear: External ear normal.  Left Ear: External ear normal.  Nose: Nose normal.  Mouth/Throat: Oropharynx is clear and moist. No oropharyngeal exudate.  Eyes: Conjunctivae and EOM are normal. Pupils are equal, round, and reactive to light. No scleral icterus.  Neck: Normal range of motion. Neck supple. No tracheal deviation present.  Cardiovascular: Normal rate, regular rhythm, normal heart sounds and intact distal pulses.  Exam reveals no gallop and no friction rub.   No murmur heard. Respiratory: Effort normal. No respiratory distress. She has no wheezes. She has no rales.  GI: Soft. Bowel sounds are normal. She exhibits distension. There is tenderness. There is no rebound and no guarding.  Soft, mildly distended, well-healed surgical scars, tenderness in bilateral lower quadrants and suprapubic area  Musculoskeletal: Normal range of motion. She exhibits no edema, tenderness or deformity.   Lymphadenopathy:    She has no cervical adenopathy.  Neurological: She is oriented to person, place, and time. No cranial nerve deficit.  Skin: Skin is warm and dry. No rash noted. No erythema. No pallor.  Psychiatric: She has a normal mood and affect. Her behavior is normal. Judgment and thought content normal.     Assessment/Plan 75 year old female with diverticulitis with a small abscess. I have personally reviewed this patient's past medical history which includes her well-controlled hypertension, A. fib, interstitial fibrosis and depression and anxiety with medications. I've also personally reviewed her last few times in the hospital over the past year for diverticulitis and pneumonias. I have also personally reviewed her colonoscopyresults which did show diverticulitis but no polyps which was performed by Dr. Vira Agar on 8/25 2017. I have personally reviewed her laboratory values which do show a mild leukocytosis but all other labs are within normal limits. I have personally reviewed her CT scan with special inflammation in the lower part of the sigmoid colon and a small abscess that's approximately 2 cm in size without any free air or evidence of perforation. Additionally there was a finding of some cysts in the head of the pancreas that were potentially very small on her last CT scan back in February but now are larger measuring 12 mm.  I have also reviewed the radiology reads that were posted above. And I have also reviewed these findings with the emergency room physician Dr. Burlene Arnt.  I had an extensive discussion with the patient and her son about diverticulitis and now the abscess that is there. I did discuss with them that the majority of the time if we catch it early we can place the patient on antibiotics and slowly advance her diet as the pain recedes and potentially avoid an emergent surgery. I did discuss with them that if she were to worsen or not fully improve while she was here in the  hospital that we may still need to do an emergent surgery which would require resection of portion of the colon and likely an ostomy placement. The patient and son were given opportunity to ask questions and they understand that this is a potential. For now we will admit the patient and make her nothing by mouth except for ice chips and sips with meds, in the hopes that she will respond  and avoid an emergent surgery. The patient understands if this is the case that in about 2-3 months from now she would need to have an elective operation for removal of the section of the colon since this is been her fifth episode in less than a year.   I have asked the medicine team Dr. Jannifer Franklin to consult for her multiple medical issues and will hold her Elloqis for now. I will order a lipase as well as a CA-19-9 while she is here due to the pancreatic head mass but she likely will need an MRI done as an outpatient to further evaluate this area.    Time spent with the patient was 75 minutes, with more than 50% of the time spent in face-to-face education, counseling and care coordination.     Hubbard Robinson, MD 08/31/2016, 10:19 PM

## 2016-09-01 LAB — CBC
HEMATOCRIT: 37.4 % (ref 35.0–47.0)
Hemoglobin: 12.7 g/dL (ref 12.0–16.0)
MCH: 29.1 pg (ref 26.0–34.0)
MCHC: 34 g/dL (ref 32.0–36.0)
MCV: 85.5 fL (ref 80.0–100.0)
PLATELETS: 257 10*3/uL (ref 150–440)
RBC: 4.37 MIL/uL (ref 3.80–5.20)
RDW: 13.2 % (ref 11.5–14.5)
WBC: 10.1 10*3/uL (ref 3.6–11.0)

## 2016-09-01 LAB — BASIC METABOLIC PANEL
Anion gap: 6 (ref 5–15)
BUN: 9 mg/dL (ref 6–20)
CALCIUM: 9 mg/dL (ref 8.9–10.3)
CO2: 27 mmol/L (ref 22–32)
CREATININE: 0.82 mg/dL (ref 0.44–1.00)
Chloride: 106 mmol/L (ref 101–111)
GFR calc Af Amer: >60 mL/min (ref >60)
GLUCOSE: 96 mg/dL (ref 65–99)
Potassium: 4 mmol/L (ref 3.5–5.1)
SODIUM: 139 mmol/L (ref 135–145)

## 2016-09-01 LAB — GLUCOSE, CAPILLARY
GLUCOSE-CAPILLARY: 94 mg/dL (ref 65–99)
Glucose-Capillary: 81 mg/dL (ref 65–99)
Glucose-Capillary: 105 mg/dL — ABNORMAL HIGH (ref 65–99)

## 2016-09-01 MED ORDER — LOSARTAN POTASSIUM 50 MG PO TABS
100.0000 mg | ORAL_TABLET | Freq: Every day | ORAL | Status: DC
  Administered 2016-09-02 – 2016-09-03 (×2): 100 mg via ORAL
  Filled 2016-09-01 (×2): qty 2

## 2016-09-01 MED ORDER — HYDRALAZINE HCL 25 MG PO TABS
25.0000 mg | ORAL_TABLET | Freq: Three times a day (TID) | ORAL | Status: DC
  Administered 2016-09-01 – 2016-09-03 (×5): 25 mg via ORAL
  Filled 2016-09-01 (×5): qty 1

## 2016-09-01 NOTE — Plan of Care (Signed)
Problem: Nutrition: Goal: Adequate nutrition will be maintained Outcome: Not Progressing Patient is NPO.

## 2016-09-01 NOTE — Progress Notes (Signed)
09/01/2016  Subjective: Patient was admitted last night acute cholecystitis and a small abscess. She was nothing by mouth with IV fluid hydration and IV antibiotics. The patient reports this morning that she's feeling much better with less pain in the lower abdomen compared to last night. Her white blood cell count has also improved to 10.1 from 12.6.  Vital signs: Temp:  [97.8 F (36.6 C)-98.3 F (36.8 C)] 97.9 F (36.6 C) (12/02 1303) Pulse Rate:  [58-86] 85 (12/02 1303) Resp:  [16-20] 20 (12/02 1303) BP: (119-178)/(51-74) 156/72 (12/02 1303) SpO2:  [93 %-98 %] 93 % (12/02 1303) Weight:  [66.2 kg (145 lb 14.4 oz)-68 kg (150 lb)] 66.2 kg (145 lb 14.4 oz) (12/01 2320)   Intake/Output: 12/01 0701 - 12/02 0700 In: -  Out: 350 [Urine:350] Last BM Date: 08/31/16  Physical Exam: Constitutional: No acute distress Abdomen:  Soft, nondistended, mildly tender to palpation the lower abdomen particularly towards the right side. Not peritoneal.  Labs:   Recent Labs  08/31/16 1729 09/01/16 0526  WBC 12.6* 10.1  HGB 13.2 12.7  HCT 39.7 37.4  PLT 292 257    Recent Labs  08/31/16 1729 09/01/16 0526  NA 138 139  K 3.9 4.0  CL 104 106  CO2 26 27  GLUCOSE 137* 96  BUN 13 9  CREATININE 0.79 0.82  CALCIUM 9.0 9.0   No results for input(s): LABPROT, INR in the last 72 hours.  Imaging: Dg Chest 2 View  Result Date: 08/31/2016 CLINICAL DATA:  75 y/o F; abdominal pain and concern for diverticulitis. EXAM: CHEST  2 VIEW COMPARISON:  11/27/2015 chest radiograph.  07/08/2015 CT chest. FINDINGS: Stable cardiac silhouette within normal limits. Coarse reticular markings of the lungs is compatible with interstitial lung disease. No focal consolidation. No pleural effusion. Stable mid thoracic compression deformity. Bones are otherwise unremarkable. IMPRESSION: No active cardiopulmonary disease. Electronically Signed   By: Kristine Garbe M.D.   On: 08/31/2016 18:52   Ct Abdomen  Pelvis W Contrast  Result Date: 08/31/2016 CLINICAL DATA:  Abdominal pain for several days. Concern for diverticulitis. History of diverticulitis, hysterectomy, appendectomy and cholecystectomy. EXAM: CT ABDOMEN AND PELVIS WITH CONTRAST TECHNIQUE: Multidetector CT imaging of the abdomen and pelvis was performed using the standard protocol following bolus administration of intravenous contrast. CONTRAST:  113m ISOVUE-300 IOPAMIDOL (ISOVUE-300) INJECTION 61% COMPARISON:  CT abdomen and pelvis September 23, 2016 FINDINGS: LOWER CHEST: Similar fibrotic changes in the lung bases. The heart size is normal. Prominent RIGHT ventricle and atrium is unchanged. No pericardial effusions. HEPATOBILIARY: Mild diffuse heterogeneous enhancement of the liver could be secondary to bolus timing, liver is otherwise unremarkable. Status post cholecystectomy. PANCREAS: New 12 mm cystic mass proximal body (axial 32/97). SPLEEN: Normal. ADRENALS/URINARY TRACT: Kidneys are orthotopic, demonstrating symmetric enhancement. No nephrolithiasis, hydronephrosis or solid renal masses. Multifocal cortical scarring bilaterally. The unopacified ureters are normal in course and caliber. Delayed imaging through the kidneys demonstrates symmetric prompt contrast excretion within the proximal urinary collecting system. Urinary bladder is partially distended and unremarkable. Normal adrenal glands. STOMACH/BOWEL: Severe sigmoid diverticulosis, at site of prior acute diverticulitis and phlegmon is now 8 2 x 2.2 cm fluid collection with gas, axial 75/97. No bowel obstruction. Moderate amount of retained large bowel stool. Stomach is normal. VASCULAR/LYMPHATIC: Aortoiliac vessels are normal in course and caliber, mild calcific atherosclerosis. No lymphadenopathy by CT size criteria. REPRODUCTIVE: Status post hysterectomy. OTHER: No intraperitoneal free fluid or free air. MUSCULOSKELETAL: Nonacute. Osteopenia. Old T8 severe compression fracture.  IMPRESSION:  2 x 2.2 cm sigmoid colon abscess with gas, suggesting fistula versus gas-forming organism. Severe sigmoid diverticulosis. **An incidental finding of potential clinical significance has been found. 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 Recommend close outpatient follow-up, and consider MRI for further characterization on a nonemergent basis.** Acute findings discussed with and reconfirmed by Dr.JAMES MCSHANE on 08/31/2016 at 8:16 pm. Electronically Signed   By: Elon Alas M.D.   On: 08/31/2016 20:18    Assessment/Plan: 75 year old female with diverticulitis with a small abscess.  -We'll start the patient on sips of water and ice chips today and possibly advance to clear liquid diet for dinner tonight based on the patient's abdominal pain improvement. -Continue IV antibiotics today. Continue IV fluid hydration.   Melvyn Neth, [place]

## 2016-09-01 NOTE — Progress Notes (Signed)
[place] [facility] [place] NAME: Julie Mcmillan    MR#:  [id number]  DATE OF BIRTH:  August 13, 1941  SUBJECTIVE:  CHIEF COMPLAINT:   Chief Complaint  Patient presents with  . Abdominal Pain   -Recurrent admissions for diverticulitis. This time abdominal pain with nausea and vomiting. Noted to have diverticular abscess with microperforation on CT -Medical consult for medical management.  REVIEW OF SYSTEMS:  Review of Systems  Constitutional: Negative for chills, fever and malaise/fatigue.  HENT: Negative for ear discharge, ear pain, hearing loss, nosebleeds and sinus pain.   Eyes: Negative for blurred vision.  Respiratory: Negative for cough, shortness of breath, wheezing and stridor.   Cardiovascular: Negative for chest pain, palpitations and leg swelling.  Gastrointestinal: Positive for abdominal pain. Negative for constipation, diarrhea, nausea and vomiting.  Genitourinary: Negative for dysuria.  Musculoskeletal: Negative for myalgias.  Neurological: Negative for dizziness, speech change, focal weakness, seizures and headaches.  Psychiatric/Behavioral: Negative for depression.    DRUG ALLERGIES:   Allergies  Allergen Reactions  . Amlodipine Swelling  . Nexium [Esomeprazole Magnesium] Diarrhea    VITALS:  Blood pressure (!) 156/72, pulse 85, temperature 97.9 F (36.6 C), temperature source Oral, resp. rate 20, height _0  (1.626 m), weight 66.2 kg (145 lb 14.4 oz), SpO2 93 %.  PHYSICAL EXAMINATION:  Physical Exam  GENERAL:  75 y.o.-year-old patient lying in the bed with no acute distress.  EYES: Pupils equal, round, reactive to light and accommodation. No scleral icterus. Extraocular muscles intact.  HEENT: Head atraumatic, normocephalic. Oropharynx and nasopharynx clear.  NECK:  Supple, no jugular venous distention. No thyroid enlargement, no tenderness.  LUNGS: Normal breath sounds bilaterally, no wheezing, rales,rhonchi or  crepitation. No use of accessory muscles of respiration.  CARDIOVASCULAR: S1, S2 normal. No murmurs, rubs, or gallops.  ABDOMEN: Soft, Nontender everywhere except left lower quadrant tenderness with voluntary guarding,, nondistended. Bowel sounds present. No organomegaly or mass.  EXTREMITIES: No pedal edema, cyanosis, or clubbing.  NEUROLOGIC: Cranial nerves II through XII are intact. Muscle strength 5/5 in all extremities. Sensation intact. Gait not checked.  PSYCHIATRIC: The patient is alert and oriented x 3.  SKIN: No obvious rash, lesion, or ulcer.    LABORATORY PANEL:   CBC  Recent Labs Lab 09/01/16 0526  WBC 10.1  HGB 12.7  HCT 37.4  PLT 257   ------------------------------------------------------------------------------------------------------------------  Chemistries   Recent Labs Lab 08/31/16 1729 09/01/16 0526  NA 138 139  K 3.9 4.0  CL 104 106  CO2 26 27  GLUCOSE 137* 96  BUN 13 9  CREATININE 0.79 0.82  CALCIUM 9.0 9.0  AST 20  --   ALT 11*  --   ALKPHOS 83  --   BILITOT 0.8  --    ------------------------------------------------------------------------------------------------------------------  Cardiac Enzymes No results for input(s): TROPONINI in the last 168 hours. ------------------------------------------------------------------------------------------------------------------  RADIOLOGY:  Dg Chest 2 View  Result Date: 08/31/2016 CLINICAL DATA:  75 y/o F; abdominal pain and concern for diverticulitis. EXAM: CHEST  2 VIEW COMPARISON:  11/27/2015 chest radiograph.  07/08/2015 CT chest. FINDINGS: Stable cardiac silhouette within normal limits. Coarse reticular markings of the lungs is compatible with interstitial lung disease. No focal consolidation. No pleural effusion. Stable mid thoracic compression deformity. Bones are otherwise unremarkable. IMPRESSION: No active cardiopulmonary disease. Electronically Signed   By: Kristine Garbe M.D.    On: 08/31/2016 18:52   Ct Abdomen Pelvis W Contrast  Result Date: 08/31/2016 CLINICAL DATA:  Abdominal pain for several days. Concern for diverticulitis. History of diverticulitis, hysterectomy, appendectomy and cholecystectomy. EXAM: CT ABDOMEN AND PELVIS WITH CONTRAST TECHNIQUE: Multidetector CT imaging of the abdomen and pelvis was performed using the standard protocol following bolus administration of intravenous contrast. CONTRAST:  12m ISOVUE-300 IOPAMIDOL (ISOVUE-300) INJECTION 61% COMPARISON:  CT abdomen and pelvis September 23, 2016 FINDINGS: LOWER CHEST: Similar fibrotic changes in the lung bases. The heart size is normal. Prominent RIGHT ventricle and atrium is unchanged. No pericardial effusions. HEPATOBILIARY: Mild diffuse heterogeneous enhancement of the liver could be secondary to bolus timing, liver is otherwise unremarkable. Status post cholecystectomy. PANCREAS: New 12 mm cystic mass proximal body (axial 32/97). SPLEEN: Normal. ADRENALS/URINARY TRACT: Kidneys are orthotopic, demonstrating symmetric enhancement. No nephrolithiasis, hydronephrosis or solid renal masses. Multifocal cortical scarring bilaterally. The unopacified ureters are normal in course and caliber. Delayed imaging through the kidneys demonstrates symmetric prompt contrast excretion within the proximal urinary collecting system. Urinary bladder is partially distended and unremarkable. Normal adrenal glands. STOMACH/BOWEL: Severe sigmoid diverticulosis, at site of prior acute diverticulitis and phlegmon is now 8 2 x 2.2 cm fluid collection with gas, axial 75/97. No bowel obstruction. Moderate amount of retained large bowel stool. Stomach is normal. VASCULAR/LYMPHATIC: Aortoiliac vessels are normal in course and caliber, mild calcific atherosclerosis. No lymphadenopathy by CT size criteria. REPRODUCTIVE: Status post hysterectomy. OTHER: No intraperitoneal free fluid or free air. MUSCULOSKELETAL: Nonacute. Osteopenia. Old T8  severe compression fracture. IMPRESSION: 2 x 2.2 cm sigmoid colon abscess with gas, suggesting fistula versus gas-forming organism. Severe sigmoid diverticulosis. **An incidental finding of potential clinical significance has been found. 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 Recommend close outpatient follow-up, and consider MRI for further characterization on a nonemergent basis.** Acute findings discussed with and reconfirmed by Dr.JAMES MCSHANE on 08/31/2016 at 8:16 pm. Electronically Signed   By: CElon AlasM.D.   On: 08/31/2016 20:18    EKG:   Orders placed or performed during the hospital encounter of 11/25/15  . ED EKG  . ED EKG  . EKG 12-Lead  . EKG 12-Lead    ASSESSMENT AND PLAN:   75year old female with past medical history significant for atrial fibrillation on eliquis, recurrent diverticulitis, GERD, hypertension, IBS, type 2 diabetes mellitus presents from home secondary to abdominal pain and noted to have diverticular abscess on CT. Medical consult requested for medical management.  #1 acute diverticulitis with diverticular abscess-admitted to surgical service. Since clinically improving, no emergent need for surgery. - continue zosyn - Mgmt per surgery - If surgery not needed this admission, might need elective surgery for recurrent diverticulitis  #2 diabetes mellitus-sliding scale insulin as she is nothing by mouth for now  #3  paroxysmal atrial fibrillation-rate controlled. Hold eliquis in case she needs surgery. -On Toprol for rate control.  #4 hypothyroidism-continue Synthroid  #5 DVT prophylaxis-on Lovenox    All the records are reviewed and case discussed with Care Management/Social Workerr. Management plans discussed with the patient, family and they are in agreement.  CODE STATUS: Full code  TOTAL TIME TAKING CARE OF THIS PATIENT: 37 minutes.   POSSIBLE D/C IN 1-2 DAYS, DEPENDING ON CLINICAL CONDITION.   KGladstone LighterM.D on 09/01/2016 at 2:17  PM  Between 7am to 6pm - Pager - [phone]  After 6pm go to [email or web address] - password [email or web address]  Office  [phone] CC: Primary care physician; SGlendon Axe MD

## 2016-09-01 NOTE — Consult Note (Signed)
[facility] [place] at [place] NAME: [name]    MR#:  [id number]  DATE OF BIRTH:  04-27-1941  DATE OF ADMISSION:  08/31/2016  PRIMARY CARE PHYSICIAN: Glendon Axe, MD   REQUESTING/REFERRING PHYSICIAN: Azalee Course, MD  CHIEF COMPLAINT:   Chief Complaint  Patient presents with  . Abdominal Pain    HISTORY OF PRESENT ILLNESS:  Julie Mcmillan  is a 75 y.o. female who presents with Abdominal pain. She has a history of diverticulitis. Current workup in the ED showed likely abscess. She is admitted to surgery service with a plan for surgical intervention. Hospitalists were called for comanagement given her significant number of comorbid medical conditions.  PAST MEDICAL HISTORY:   Past Medical History:  Diagnosis Date  . A-fib ([place])   . Chronic diarrhea   . Diverticulitis   . GERD (gastroesophageal reflux disease)    usually takes protonix  . Hip fracture ([place]) 11/2015   hairline fracture on right.  no surgery, just physical therapy  . Hypertension   . IBS (irritable bowel syndrome)   . IBS (irritable bowel syndrome)   . Interstitial lung disease ([place])    does not remember when  . Seasonal affective disorder ([place])   . Shortness of breath dyspnea    with exertion  . Type 2 diabetes mellitus ([place]) 11/04/2014    PAST SURGICAL HISTOIRY:   Past Surgical History:  Procedure Laterality Date  . ABDOMINAL HYSTERECTOMY     partial  . APPENDECTOMY    . CATARACT EXTRACTION W/PHACO Right 03/16/2015   Procedure: CATARACT EXTRACTION PHACO AND INTRAOCULAR LENS PLACEMENT (IOC);  Surgeon: Leandrew Koyanagi, MD;  Location: [facility];  Service: Ophthalmology;  Laterality: Right;  . CATARACT EXTRACTION W/PHACO Left 06/13/2016   Procedure: CATARACT EXTRACTION PHACO AND INTRAOCULAR LENS PLACEMENT (IOC);  Surgeon: Leandrew Koyanagi, MD;  Location: [facility];  Service: Ophthalmology;  Laterality: Left;  pre-diabetic - diet controlled   . COLONOSCOPY    . COLONOSCOPY WITH PROPOFOL N/A 05/25/2016   Procedure: COLONOSCOPY WITH PROPOFOL;  Surgeon: Manya Silvas, MD;  Location: [facility] END[facility]COPY;  Service: Endoscopy;  Laterality: N/A;  . ESOPHAGOGASTRODUODENOSCOPY (EGD) WITH PROPOFOL N/A 05/25/2016   Procedure: ESOPHAGOGASTRODUODENOSCOPY (EGD) WITH PROPOFOL;  Surgeon: Manya Silvas, MD;  Location: [facility] ENDOSCOPY;  Service: Endoscopy;  Laterality: N/A;  . NISSEN FUNDOPLICATION  4827   came apart in 2016 and ibs and reflux returned  . OPEN REDUCTION INTERNAL FIXATION (ORIF) DISTAL RADIAL FRACTURE Right 01/13/2016   Procedure: OPEN REDUCTION INTERNAL FIXATION (ORIF) DISTAL RADIAL FRACTURE;  Surgeon: Hessie Knows, MD;  Location: [facility] ORS;  Service: Orthopedics;  Laterality: Right;  . TONSILLECTOMY     and adenoidectomy    SOCIAL HISTORY:   Social History  Substance Use Topics  . Smoking status: Never Smoker  . Smokeless tobacco: Never Used     Comment: no passive smokers in home  . Alcohol use No    FAMILY HISTORY:   Family History  Problem Relation Age of Onset  . Breast cancer Mother   . Diabetes Mellitus II Mother   . Hypothyroidism Mother   . Atrial fibrillation Mother   . Heart attack Father     DRUG ALLERGIES:   Allergies  Allergen Reactions  . Amlodipine Swelling  . Nexium [Esomeprazole Magnesium] Diarrhea    REVIEW OF SYSTEMS:  Review of Systems  Constitutional: Negative for chills, fever, malaise/fatigue and weight loss.  HENT: Negative for ear pain, hearing loss and  tinnitus.   Eyes: Negative for blurred vision, double vision, pain and redness.  Respiratory: Negative for cough, hemoptysis and shortness of breath.   Cardiovascular: Negative for chest pain, palpitations, orthopnea and leg swelling.  Gastrointestinal: Positive for abdominal pain and nausea. Negative for constipation, diarrhea and vomiting.  Genitourinary: Negative for dysuria, frequency and hematuria.  Musculoskeletal: Negative  for back pain, joint pain and neck pain.  Skin:       No acne, rash, or lesions  Neurological: Negative for dizziness, tremors, focal weakness and weakness.  Endo/Heme/Allergies: Negative for polydipsia. Does not bruise/bleed easily.  Psychiatric/Behavioral: Negative for depression. The patient is not nervous/anxious and does not have insomnia.     MEDICATIONS AT HOME:   Prior to Admission medications   Medication Sig Start Date End Date Taking? Authorizing Provider  apixaban (ELIQUIS) 5 MG TABS tablet Take 5 mg by mouth 2 (two) times daily.   Yes Historical Provider, MD  Calcium Carbonate-Vitamin D (CALCIUM 600+D) 600-200 MG-UNIT TABS Take 1 tablet by mouth daily.   Yes Historical Provider, MD  cholestyramine Lucrezia Starch) 4 GM/DOSE powder Take 1 packet by mouth 4 (four) times daily. 09/12/15  Yes Historical Provider, MD  FLUoxetine (PROZAC) 20 MG capsule Take 20 mg by mouth daily.   Yes Historical Provider, MD  hydrALAZINE (APRESOLINE) 25 MG tablet Take 25 mg by mouth 3 (three) times daily.   Yes Historical Provider, MD  levothyroxine (SYNTHROID, LEVOTHROID) 50 MCG tablet Take 50 mcg by mouth daily before breakfast.   Yes Historical Provider, MD  losartan (COZAAR) 100 MG tablet Take 100 mg by mouth daily.    Yes Historical Provider, MD  metoprolol succinate (TOPROL-XL) 50 MG 24 hr tablet Take 50 mg by mouth daily.   Yes Historical Provider, MD  Multiple Vitamin (MULTIVITAMIN WITH MINERALS) TABS tablet Take 1 tablet by mouth daily.   Yes Historical Provider, MD  pantoprazole (PROTONIX) 40 MG tablet Take 1 tablet (40 mg total) by mouth daily. 03/29/15  Yes Glendon Axe, MD  ranitidine (ZANTAC) 300 MG tablet Take 300 mg by mouth daily.   Yes Historical Provider, MD  sotalol (BETAPACE) 80 MG tablet Take 1 tablet (80 mg total) by mouth every 12 (twelve) hours. 03/29/15  Yes Glendon Axe, MD  diltiazem (CARDIZEM CD) 120 MG 24 hr capsule Take 1 capsule (120 mg total) by mouth daily. Patient not  taking: Reported on 08/31/2016 11/29/15   Hillary Bow, MD  ipratropium-albuterol (DUONEB) 0.5-2.5 (3) MG/3ML SOLN Take 3 mLs by nebulization every 4 (four) hours as needed. Patient not taking: Reported on 08/31/2016 11/29/15   Hillary Bow, MD  ondansetron (ZOFRAN ODT) 4 MG disintegrating tablet Take 1 tablet (4 mg total) by mouth every 8 (eight) hours as needed for nausea or vomiting. 11/15/15   Loney Hering, MD      VITAL SIGNS:   Vitals:   08/31/16 1713 08/31/16 1714 08/31/16 2239 08/31/16 2320  BP: (!) 119/59  (!) 178/66 (!) 153/74  Pulse: 64  70 86  Resp:   17 20  Temp: 98.3 F (36.8 C)   98 F (36.7 C)  TempSrc: Oral   Oral  SpO2: 97%  96% 97%  Weight:  68 kg (150 lb)  66.2 kg (145 lb 14.4 oz)  Height:  _0  (1.626 m)  _1  (1.626 m)   Wt Readings from Last 3 Encounters:  08/31/16 66.2 kg (145 lb 14.4 oz)  06/13/16 66.7 kg (147 lb)  05/25/16 63.5 kg (140 lb)  PHYSICAL EXAMINATION:  Physical Exam  Vitals reviewed. Constitutional: She is oriented to person, place, and time. She appears well-developed and well-nourished. No distress.  HENT:  Head: Normocephalic and atraumatic.  Mouth/Throat: Oropharynx is clear and moist.  Eyes: Conjunctivae and EOM are normal. Pupils are equal, round, and reactive to light. No scleral icterus.  Neck: Normal range of motion. Neck supple. No JVD present. No thyromegaly present.  Cardiovascular: Normal rate, regular rhythm and intact distal pulses.  Exam reveals no gallop and no friction rub.   No murmur heard. Respiratory: Effort normal and breath sounds normal. No respiratory distress. She has no wheezes. She has no rales.  GI: Soft. Bowel sounds are normal. She exhibits no distension. There is tenderness.  Musculoskeletal: Normal range of motion. She exhibits no edema.  No arthritis, no gout  Lymphadenopathy:    She has no cervical adenopathy.  Neurological: She is alert and oriented to person, place, and time. No cranial nerve  deficit.  No dysarthria, no aphasia  Skin: Skin is warm and dry. No rash noted. No erythema.  Psychiatric: She has a normal mood and affect. Her behavior is normal. Judgment and thought content normal.     LABORATORY PANEL:   CBC  Recent Labs Lab 08/31/16 1729  WBC 12.6*  HGB 13.2  HCT 39.7  PLT 292   ------------------------------------------------------------------------------------------------------------------  Chemistries   Recent Labs Lab 08/31/16 1729  NA 138  K 3.9  CL 104  CO2 26  GLUCOSE 137*  BUN 13  CREATININE 0.79  CALCIUM 9.0  AST 20  ALT 11*  ALKPHOS 83  BILITOT 0.8   ------------------------------------------------------------------------------------------------------------------  Cardiac Enzymes No results for input(s): TROPONINI in the last 168 hours. ------------------------------------------------------------------------------------------------------------------  RADIOLOGY:  Dg Chest 2 View  Result Date: 08/31/2016 CLINICAL DATA:  75 y/o F; abdominal pain and concern for diverticulitis. EXAM: CHEST  2 VIEW COMPARISON:  11/27/2015 chest radiograph.  07/08/2015 CT chest. FINDINGS: Stable cardiac silhouette within normal limits. Coarse reticular markings of the lungs is compatible with interstitial lung disease. No focal consolidation. No pleural effusion. Stable mid thoracic compression deformity. Bones are otherwise unremarkable. IMPRESSION: No active cardiopulmonary disease. Electronically Signed   By: Kristine Garbe M.D.   On: 08/31/2016 18:52   Ct Abdomen Pelvis W Contrast  Result Date: 08/31/2016 CLINICAL DATA:  Abdominal pain for several days. Concern for diverticulitis. History of diverticulitis, hysterectomy, appendectomy and cholecystectomy. EXAM: CT ABDOMEN AND PELVIS WITH CONTRAST TECHNIQUE: Multidetector CT imaging of the abdomen and pelvis was performed using the standard protocol following bolus administration of intravenous  contrast. CONTRAST:  141m ISOVUE-300 IOPAMIDOL (ISOVUE-300) INJECTION 61% COMPARISON:  CT abdomen and pelvis September 23, 2016 FINDINGS: LOWER CHEST: Similar fibrotic changes in the lung bases. The heart size is normal. Prominent RIGHT ventricle and atrium is unchanged. No pericardial effusions. HEPATOBILIARY: Mild diffuse heterogeneous enhancement of the liver could be secondary to bolus timing, liver is otherwise unremarkable. Status post cholecystectomy. PANCREAS: New 12 mm cystic mass proximal body (axial 32/97). SPLEEN: Normal. ADRENALS/URINARY TRACT: Kidneys are orthotopic, demonstrating symmetric enhancement. No nephrolithiasis, hydronephrosis or solid renal masses. Multifocal cortical scarring bilaterally. The unopacified ureters are normal in course and caliber. Delayed imaging through the kidneys demonstrates symmetric prompt contrast excretion within the proximal urinary collecting system. Urinary bladder is partially distended and unremarkable. Normal adrenal glands. STOMACH/BOWEL: Severe sigmoid diverticulosis, at site of prior acute diverticulitis and phlegmon is now 8 2 x 2.2 cm fluid collection with gas, axial 75/97. No bowel obstruction.  Moderate amount of retained large bowel stool. Stomach is normal. VASCULAR/LYMPHATIC: Aortoiliac vessels are normal in course and caliber, mild calcific atherosclerosis. No lymphadenopathy by CT size criteria. REPRODUCTIVE: Status post hysterectomy. OTHER: No intraperitoneal free fluid or free air. MUSCULOSKELETAL: Nonacute. Osteopenia. Old T8 severe compression fracture. IMPRESSION: 2 x 2.2 cm sigmoid colon abscess with gas, suggesting fistula versus gas-forming organism. Severe sigmoid diverticulosis. **An incidental finding of potential clinical significance has been found. 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 Recommend close outpatient follow-up, and consider MRI for further characterization on a nonemergent basis.** Acute findings discussed with and reconfirmed by  Dr.JAMES MCSHANE on 08/31/2016 at 8:16 pm. Electronically Signed   By: Elon Alas M.D.   On: 08/31/2016 20:18    EKG:   Orders placed or performed during the hospital encounter of 11/25/15  . ED EKG  . ED EKG  . EKG 12-Lead  . EKG 12-Lead    IMPRESSION AND PLAN:  Principal Problem:   Diverticulitis of large intestine with abscess without bleeding - antibiotics and primary management per surgical team. Active Problems:   Type 2 diabetes mellitus ([place]) - unclear if she's actually been diagnosed with diabetes are just impaired glucose tolerance. For now we will follow her glucose with every 8 hour checks. If she is tending to run high we will add sliding scale insulin.   Paroxysmal atrial fibrillation ([facility]) - stable, continue home meds except for Eliquis which will be held in preparation for surgery    Essential (primary) hypertension - stable, continue home meds   Interstitial lung disease ([place]) - continue home meds  All the records are reviewed and case discussed with ED provider. Management plans discussed with the patient and/or family.  CODE STATUS: Full    Code Status Orders        Start     Ordered   08/31/16 2216  Full code  Continuous     08/31/16 2218    Code Status History    Date Active Date Inactive Code Status Order ID Comments User Context   11/26/2015 12:02 AM 11/29/2015  9:00 PM Full Code [id number]  Hillary Bow, MD ED   07/04/2015  5:17 PM 07/10/2015  8:11 PM Full Code [id number]  Marlyce Huge, MD ED   03/25/2015  7:47 PM 03/29/2015  8:44 PM Full Code [id number]  Vaughan Basta, MD Inpatient    Advance Directive Documentation   Flowsheet Row Most Recent Value  Type of Advance Directive  Healthcare Power of Attorney, Living will  Pre-existing out of facility DNR order (yellow form or pink MOST form)  No data  "MOST" Form in Place?  No data    Full Code  TOTAL TIME TAKING CARE OF THIS PATIENT: 45 minutes.    Warwick Nick  [place] 09/01/2016, 12:08 AM  [facility]  Office  [phone]  CC: Primary care Physician: Glendon Axe, MD

## 2016-09-02 LAB — CBC WITH DIFFERENTIAL/PLATELET
BASOS ABS: 0 10*3/uL (ref 0–0.1)
BASOS PCT: 1 %
EOS PCT: 4 %
Eosinophils Absolute: 0.3 10*3/uL (ref 0–0.7)
HCT: 38 % (ref 35.0–47.0)
Hemoglobin: 13.1 g/dL (ref 12.0–16.0)
LYMPHS PCT: 14 %
Lymphs Abs: 1.2 10*3/uL (ref 1.0–3.6)
MCH: 28.8 pg (ref 26.0–34.0)
MCHC: 34.4 g/dL (ref 32.0–36.0)
MCV: 83.7 fL (ref 80.0–100.0)
MONO ABS: 1.2 10*3/uL — AB (ref 0.2–0.9)
Monocytes Relative: 14 %
Neutro Abs: 6 10*3/uL (ref 1.4–6.5)
Neutrophils Relative %: 67 %
PLATELETS: 259 10*3/uL (ref 150–440)
RBC: 4.55 MIL/uL (ref 3.80–5.20)
RDW: 13.3 % (ref 11.5–14.5)
WBC: 8.8 10*3/uL (ref 3.6–11.0)

## 2016-09-02 LAB — GLUCOSE, CAPILLARY
GLUCOSE-CAPILLARY: 91 mg/dL (ref 65–99)
Glucose-Capillary: 108 mg/dL — ABNORMAL HIGH (ref 65–99)
Glucose-Capillary: 126 mg/dL — ABNORMAL HIGH (ref 65–99)
Glucose-Capillary: 139 mg/dL — ABNORMAL HIGH (ref 65–99)

## 2016-09-02 LAB — URINE CULTURE

## 2016-09-02 LAB — BASIC METABOLIC PANEL
ANION GAP: 9 (ref 5–15)
BUN: 7 mg/dL (ref 6–20)
CALCIUM: 8.6 mg/dL — AB (ref 8.9–10.3)
CO2: 24 mmol/L (ref 22–32)
CREATININE: 0.81 mg/dL (ref 0.44–1.00)
Chloride: 106 mmol/L (ref 101–111)
GFR calc Af Amer: >60 mL/min (ref >60)
GLUCOSE: 114 mg/dL — AB (ref 65–99)
Potassium: 3.6 mmol/L (ref 3.5–5.1)
Sodium: 139 mmol/L (ref 135–145)

## 2016-09-02 MED ORDER — AMOXICILLIN-POT CLAVULANATE 875-125 MG PO TABS
1.0000 | ORAL_TABLET | Freq: Two times a day (BID) | ORAL | Status: DC
  Administered 2016-09-02 – 2016-09-03 (×3): 1 via ORAL
  Filled 2016-09-02 (×3): qty 1

## 2016-09-02 MED ORDER — PANTOPRAZOLE SODIUM 40 MG PO TBEC
40.0000 mg | DELAYED_RELEASE_TABLET | Freq: Every day | ORAL | Status: DC
  Administered 2016-09-02: 40 mg via ORAL
  Filled 2016-09-02: qty 1

## 2016-09-02 NOTE — Progress Notes (Signed)
09/02/2016  Subjective: No acute events overnight. Patient tolerated clear liquid diet. Pain is significantly improved and is almost negligible at this point.  Vital signs: Temp:  [97.7 F (36.5 C)-98.3 F (36.8 C)] 97.8 F (36.6 C) (12/03 0522) Pulse Rate:  [58-94] 94 (12/03 0522) Resp:  [16-22] 22 (12/03 0522) BP: (156-169)/(51-91) 162/87 (12/03 0522) SpO2:  [91 %-97 %] 91 % (12/03 0522) Weight:  [71.2 kg (157 lb)] 71.2 kg (157 lb) (12/03 0500)   Intake/Output: 12/02 0701 - 12/03 0700 In: 2711.7 [P.O.:220; I.V.:2341.7; IV Piggyback:150] Out: 1150 [Urine:1150] Last BM Date: 09/02/16  Physical Exam: Constitutional: No acute distress Abdomen: Soft, nondistended, nontender to palpation. Patient describes mild soreness on the right lower side.  Labs:   Recent Labs  09/01/16 0526 09/02/16 0535  WBC 10.1 8.8  HGB 12.7 13.1  HCT 37.4 38.0  PLT 257 259    Recent Labs  09/01/16 0526 09/02/16 0535  NA 139 139  K 4.0 3.6  CL 106 106  CO2 27 24  GLUCOSE 96 114*  BUN 9 7  CREATININE 0.82 0.81  CALCIUM 9.0 8.6*   No results for input(s): LABPROT, INR in the last 72 hours.  Imaging: No results found.  Assessment/Plan: 75 year old female with acute diverticulitis with a small abscess.  -Change antibiotics to oral Augmentin today. -Advance diet to full liquids for breakfast and regular for lunch. His continue IV fluids. -Possible discharge to home in the afternoon if the patient tolerated her diet.   Melvyn Neth, [place]

## 2016-09-02 NOTE — Progress Notes (Signed)
[facility] [place] NAME: Julie Mcmillan    MR#:  [id number]  DATE OF BIRTH:  Oct 16, 1940  SUBJECTIVE:  CHIEF COMPLAINT:   Chief Complaint  Patient presents with  . Abdominal Pain   -feels much improved. Very minimal abdominal pain. -Tolerating diet well. One episode of loose stool this morning -patient also has IBS  REVIEW OF SYSTEMS:  Review of Systems  Constitutional: Negative for chills, fever and malaise/fatigue.  HENT: Negative for ear discharge, ear pain, hearing loss, nosebleeds and sinus pain.   Eyes: Negative for blurred vision.  Respiratory: Negative for cough, shortness of breath, wheezing and stridor.   Cardiovascular: Negative for chest pain, palpitations and leg swelling.  Gastrointestinal: Positive for abdominal pain and diarrhea. Negative for constipation, nausea and vomiting.  Genitourinary: Negative for dysuria.  Musculoskeletal: Negative for myalgias.  Neurological: Negative for dizziness, speech change, focal weakness, seizures and headaches.  Psychiatric/Behavioral: Negative for depression.    DRUG ALLERGIES:   Allergies  Allergen Reactions  . Amlodipine Swelling  . Nexium [Esomeprazole Magnesium] Diarrhea    VITALS:  Blood pressure (!) 162/87, pulse 94, temperature 97.8 F (36.6 C), temperature source Oral, resp. rate (!) 22, height _0  (1.626 m), weight 71.2 kg (157 lb), SpO2 91 %.  PHYSICAL EXAMINATION:  Physical Exam  GENERAL:  75 y.o.-year-old patient lying in the bed with no acute distress.  EYES: Pupils equal, round, reactive to light and accommodation. No scleral icterus. Extraocular muscles intact.  HEENT: Head atraumatic, normocephalic. Oropharynx and nasopharynx clear.  NECK:  Supple, no jugular venous distention. No thyroid enlargement, no tenderness.  LUNGS: Normal breath sounds bilaterally, no wheezing, rales,rhonchi or crepitation. No use of accessory muscles of respiration.    CARDIOVASCULAR: S1, S2 normal. No murmurs, rubs, or gallops.  ABDOMEN: Soft, Nontender everywhere except right lower quadrant discomfort on palpation, no guarding,, nondistended. Bowel sounds present. No organomegaly or mass.  EXTREMITIES: No pedal edema, cyanosis, or clubbing.  NEUROLOGIC: Cranial nerves II through XII are intact. Muscle strength 5/5 in all extremities. Sensation intact. Gait not checked.  PSYCHIATRIC: The patient is alert and oriented x 3.  SKIN: No obvious rash, lesion, or ulcer.    LABORATORY PANEL:   CBC  Recent Labs Lab 09/02/16 0535  WBC 8.8  HGB 13.1  HCT 38.0  PLT 259   ------------------------------------------------------------------------------------------------------------------  Chemistries   Recent Labs Lab 08/31/16 1729  09/02/16 0535  NA 138  < > 139  K 3.9  < > 3.6  CL 104  < > 106  CO2 26  < > 24  GLUCOSE 137*  < > 114*  BUN 13  < > 7  CREATININE 0.79  < > 0.81  CALCIUM 9.0  < > 8.6*  AST 20  --   --   ALT 11*  --   --   ALKPHOS 83  --   --   BILITOT 0.8  --   --   < > = values in this interval not displayed. ------------------------------------------------------------------------------------------------------------------  Cardiac Enzymes No results for input(s): TROPONINI in the last 168 hours. ------------------------------------------------------------------------------------------------------------------  RADIOLOGY:  Dg Chest 2 View  Result Date: 08/31/2016 CLINICAL DATA:  75 y/o F; abdominal pain and concern for diverticulitis. EXAM: CHEST  2 VIEW COMPARISON:  11/27/2015 chest radiograph.  07/08/2015 CT chest. FINDINGS: Stable cardiac silhouette within normal limits. Coarse reticular markings of the lungs is compatible with interstitial lung disease. No focal consolidation. No pleural effusion.  Stable mid thoracic compression deformity. Bones are otherwise unremarkable. IMPRESSION: No active cardiopulmonary disease.  Electronically Signed   By: Kristine Garbe M.D.   On: 08/31/2016 18:52   Ct Abdomen Pelvis W Contrast  Result Date: 08/31/2016 CLINICAL DATA:  Abdominal pain for several days. Concern for diverticulitis. History of diverticulitis, hysterectomy, appendectomy and cholecystectomy. EXAM: CT ABDOMEN AND PELVIS WITH CONTRAST TECHNIQUE: Multidetector CT imaging of the abdomen and pelvis was performed using the standard protocol following bolus administration of intravenous contrast. CONTRAST:  183m ISOVUE-300 IOPAMIDOL (ISOVUE-300) INJECTION 61% COMPARISON:  CT abdomen and pelvis September 23, 2016 FINDINGS: LOWER CHEST: Similar fibrotic changes in the lung bases. The heart size is normal. Prominent RIGHT ventricle and atrium is unchanged. No pericardial effusions. HEPATOBILIARY: Mild diffuse heterogeneous enhancement of the liver could be secondary to bolus timing, liver is otherwise unremarkable. Status post cholecystectomy. PANCREAS: New 12 mm cystic mass proximal body (axial 32/97). SPLEEN: Normal. ADRENALS/URINARY TRACT: Kidneys are orthotopic, demonstrating symmetric enhancement. No nephrolithiasis, hydronephrosis or solid renal masses. Multifocal cortical scarring bilaterally. The unopacified ureters are normal in course and caliber. Delayed imaging through the kidneys demonstrates symmetric prompt contrast excretion within the proximal urinary collecting system. Urinary bladder is partially distended and unremarkable. Normal adrenal glands. STOMACH/BOWEL: Severe sigmoid diverticulosis, at site of prior acute diverticulitis and phlegmon is now 8 2 x 2.2 cm fluid collection with gas, axial 75/97. No bowel obstruction. Moderate amount of retained large bowel stool. Stomach is normal. VASCULAR/LYMPHATIC: Aortoiliac vessels are normal in course and caliber, mild calcific atherosclerosis. No lymphadenopathy by CT size criteria. REPRODUCTIVE: Status post hysterectomy. OTHER: No intraperitoneal free fluid or  free air. MUSCULOSKELETAL: Nonacute. Osteopenia. Old T8 severe compression fracture. IMPRESSION: 2 x 2.2 cm sigmoid colon abscess with gas, suggesting fistula versus gas-forming organism. Severe sigmoid diverticulosis. **An incidental finding of potential clinical significance has been found. 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 Recommend close outpatient follow-up, and consider MRI for further characterization on a nonemergent basis.** Acute findings discussed with and reconfirmed by Dr.JAMES MCSHANE on 08/31/2016 at 8:16 pm. Electronically Signed   By: CElon AlasM.D.   On: 08/31/2016 20:18    EKG:   Orders placed or performed during the hospital encounter of 11/25/15  . ED EKG  . ED EKG  . EKG 12-Lead  . EKG 12-Lead    ASSESSMENT AND PLAN:   75year old female with past medical history significant for atrial fibrillation on eliquis, recurrent diverticulitis, GERD, hypertension, IBS, type 2 diabetes mellitus presents from home secondary to abdominal pain and noted to have diverticular abscess on CT. Medical consult requested for medical management.  #1 acute diverticulitis with diverticular abscess-admitted to surgical service.  -Significantly improved clinically. No need for urgent surgery at this time. -Due to recurrent diverticulitis, will need elective surgery as outpatient. Received Zosyn in the hospital, change to oral Augmentin. -Diet is being advanced today. 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 -Outpatient MRI of the abdomen advised for further characterization.  #3  paroxysmal atrial fibrillation-rate controlled. Eliquis can be restarted at discharge. Will be stopped prior to her elective surgery.. -On Toprol for rate control. Restart her sotalol at discharge.  #4 hypothyroidism-continue Synthroid  #5 DVT prophylaxis-on Lovenox   Patient is medically stable. Possible  discharge today. We'll sign off.    All the records are reviewed and case discussed with Care Management/Social Workerr. Management plans discussed with the patient,  family and they are in agreement.  CODE STATUS: Full code  TOTAL TIME TAKING CARE OF THIS PATIENT: 37 minutes.   POSSIBLE D/C TODAY, DEPENDING ON CLINICAL CONDITION.   Gladstone Lighter M.D on 09/02/2016 at 10:38 AM  Between 7am to 6pm - Pager - [phone]  After 6pm go to [email or web address] - password [email or web address]  Office  [phone]  CC: Primary care physician; Glendon Axe, MD

## 2016-09-02 NOTE — Care Management Important Message (Signed)
Important Message  Patient Details  Name: Julie Mcmillan MRN: [id number] Date of Birth: December 09, 1940   Medicare Important Message Given:  Yes    Asiah Befort A, RN 09/02/2016, 4:20 PM

## 2016-09-02 NOTE — Progress Notes (Signed)
Patient up this shift ambulating; no distress observed; meds as ordered; no stool this shift after order placed; appetite fair; no nausea or vomiting this shift; Will continue to monitor.

## 2016-09-02 NOTE — Progress Notes (Signed)
The patient is receiving Protonix by the intravenous route.  Based on criteria approved by the Pharmacy and [place], the medication is being converted to the equivalent oral dose form.  These criteria include: -No active GI bleeding -Able to tolerate diet of full liquids (or better) or tube feeding -Able to tolerate other medications by the oral or enteral route

## 2016-09-03 LAB — CBC WITH DIFFERENTIAL/PLATELET
BASOS ABS: 0.1 10*3/uL (ref 0–0.1)
BASOS PCT: 1 %
EOS ABS: 0.3 10*3/uL (ref 0–0.7)
EOS PCT: 4 %
HCT: 36.5 % (ref 35.0–47.0)
HEMOGLOBIN: 12.6 g/dL (ref 12.0–16.0)
LYMPHS ABS: 1.6 10*3/uL (ref 1.0–3.6)
Lymphocytes Relative: 20 %
MCH: 28.9 pg (ref 26.0–34.0)
MCHC: 34.4 g/dL (ref 32.0–36.0)
MCV: 84.1 fL (ref 80.0–100.0)
Monocytes Absolute: 1.2 10*3/uL — ABNORMAL HIGH (ref 0.2–0.9)
Monocytes Relative: 15 %
NEUTROS PCT: 60 %
Neutro Abs: 4.7 10*3/uL (ref 1.4–6.5)
PLATELETS: 278 10*3/uL (ref 150–440)
RBC: 4.34 MIL/uL (ref 3.80–5.20)
RDW: 13.5 % (ref 11.5–14.5)
WBC: 7.9 10*3/uL (ref 3.6–11.0)

## 2016-09-03 LAB — CA 19-9 (SERIAL): CA 19-9: 5 U/mL (ref 0–35)

## 2016-09-03 LAB — GLUCOSE, CAPILLARY: GLUCOSE-CAPILLARY: 106 mg/dL — AB (ref 65–99)

## 2016-09-03 MED ORDER — AMOXICILLIN-POT CLAVULANATE 875-125 MG PO TABS: 1.0000 | ORAL_TABLET | Freq: Two times a day (BID) | ORAL | 0 refills | Status: DC

## 2016-09-03 NOTE — Progress Notes (Signed)
IV was removed. Discharge instructions, follow-up appointments, and prescriptions were provided to the pt. All questions were answered.The pt was taken downstairs.

## 2016-09-03 NOTE — Discharge Summary (Signed)
Patient ID: Julie Mcmillan MRN: [id number] DOB/AGE: 76-May-1942 76 y.o.  Admit date: 08/31/2016 Discharge date: 09/03/2016   Discharge Diagnoses:  Principal Problem:   Diverticulitis of large intestine with abscess without bleeding Active Problems:   Type 2 diabetes mellitus (HCC)   Paroxysmal atrial fibrillation (HCC)   Essential (primary) hypertension   Interstitial lung disease ([place])   Diverticulitis of large intestine with abscess   Procedures:None  Hospital Course:  75 yr old female with multiple medical issues including Pulmonary fibrosis, Afib on Elloquis, HTN, Depression, anxiety, and history of recurrent diverticulitis presented to the ER for abdominal pain and w/u revealing diverticulitis. No evidence of perforation. She was admitted kept NPO and on IV antibiotic with good response. She was transitioned to PO augmentin which she tolerated well. She developed transient diarrhea that subsided. At the time of DC she was ambulating , tolerating PO, her AVSS w/o complaints.Her PE revealed a female in NAD Abd: soft, Nt, no peritonitis. Ext: no edema and well perfused. Condition at time of DC is stable. Counseled about the need for elective sigmoid colectomy. She understands.   Disposition: 01-Home or Self Care  Discharge Instructions    Call MD for:  difficulty breathing, headache or visual disturbances    Complete by:  As directed    Call MD for:  extreme fatigue    Complete by:  As directed    Call MD for:  hives    Complete by:  As directed    Call MD for:  persistant dizziness or light-headedness    Complete by:  As directed    Call MD for:  persistant nausea and vomiting    Complete by:  As directed    Call MD for:  redness, tenderness, or signs of infection (pain, swelling, redness, odor or green/yellow discharge around incision site)    Complete by:  As directed    Call MD for:  severe uncontrolled pain    Complete by:  As directed    Call MD for:  temperature  >100.4    Complete by:  As directed    Diet - low sodium heart healthy    Complete by:  As directed    Increase activity slowly    Complete by:  As directed        Medication List    TAKE these medications   amoxicillin-clavulanate 875-125 MG tablet Commonly known as:  AUGMENTIN Take 1 tablet by mouth every 12 (twelve) hours.   CALCIUM 600+D 600-200 MG-UNIT Tabs Generic drug:  Calcium Carbonate-Vitamin D Take 1 tablet by mouth daily.   cholestyramine 4 GM/DOSE powder Commonly known as:  QUESTRAN Take 1 packet by mouth 4 (four) times daily.   diltiazem 120 MG 24 hr capsule Commonly known as:  CARDIZEM CD Take 1 capsule (120 mg total) by mouth daily.   ELIQUIS 5 MG Tabs tablet Generic drug:  apixaban Take 5 mg by mouth 2 (two) times daily.   FLUoxetine 20 MG capsule Commonly known as:  PROZAC Take 20 mg by mouth daily.   hydrALAZINE 25 MG tablet Commonly known as:  APRESOLINE Take 25 mg by mouth 3 (three) times daily.   ipratropium-albuterol 0.5-2.5 (3) MG/3ML Soln Commonly known as:  DUONEB Take 3 mLs by nebulization every 4 (four) hours as needed.   levothyroxine 50 MCG tablet Commonly known as:  SYNTHROID, LEVOTHROID Take 50 mcg by mouth daily before breakfast.   losartan 100 MG tablet Commonly known as:  COZAAR Take 100  mg by mouth daily.   metoprolol succinate 50 MG 24 hr tablet Commonly known as:  TOPROL-XL Take 50 mg by mouth daily.   multivitamin with minerals Tabs tablet Take 1 tablet by mouth daily.   ondansetron 4 MG disintegrating tablet Commonly known as:  ZOFRAN ODT Take 1 tablet (4 mg total) by mouth every 8 (eight) hours as needed for nausea or vomiting.   pantoprazole 40 MG tablet Commonly known as:  PROTONIX Take 1 tablet (40 mg total) by mouth daily.   ranitidine 300 MG tablet Commonly known as:  ZANTAC Take 300 mg by mouth daily.   sotalol 80 MG tablet Commonly known as:  BETAPACE Take 1 tablet (80 mg total) by mouth every 12  (twelve) hours.      Follow-up [place], MD Follow up in 3 week(s).   Specialty:  Surgery Contact information: [place] [address] [phone]            Caroleen Hamman, MD FACS

## 2016-09-03 NOTE — Progress Notes (Signed)
Pt alert and sitting on side of bed and in good spirits. Pt talked at length about family and previous faith tradition of her mother. Pt poke of son's addictions to drugs but now has overcome them and is successful. CH is available.   09/03/16 1015  Clinical Encounter Type  Visited With Patient  Visit Type Initial  Referral From Nurse  Spiritual Encounters  Spiritual Needs Emotional  Stress Factors  Patient Stress Factors None identified

## 2016-09-04 ENCOUNTER — Other Ambulatory Visit: Payer: Self-pay | Admitting: Internal Medicine

## 2016-09-04 DIAGNOSIS — K862 Cyst of pancreas: Secondary | ICD-10-CM

## 2016-09-14 ENCOUNTER — Other Ambulatory Visit: Payer: Self-pay | Admitting: Internal Medicine

## 2016-09-14 ENCOUNTER — Ambulatory Visit
Admission: RE | Admit: 2016-09-14 | Discharge: 2016-09-14 | Disposition: A | Discharge status: Home / Self Care | Payer: Medicare Other | Source: Ambulatory Visit | Attending: Internal Medicine | Admitting: Internal Medicine

## 2016-09-14 DIAGNOSIS — K449 Diaphragmatic hernia without obstruction or gangrene: Secondary | ICD-10-CM | POA: Insufficient documentation

## 2016-09-14 DIAGNOSIS — K862 Cyst of pancreas: Secondary | ICD-10-CM | POA: Diagnosis not present

## 2016-09-14 DIAGNOSIS — K838 Other specified diseases of biliary tract: Secondary | ICD-10-CM | POA: Diagnosis not present

## 2016-09-14 MED ORDER — GADOBENATE DIMEGLUMINE 529 MG/ML IV SOLN
15.0000 mL | Freq: Once | INTRAVENOUS | Status: AC | PRN
  Administered 2016-09-14: 14 mL via INTRAVENOUS

## 2016-09-21 ENCOUNTER — Other Ambulatory Visit: Payer: Self-pay

## 2016-09-21 ENCOUNTER — Inpatient Hospital Stay: Payer: Self-pay | Admitting: Surgery

## 2016-10-16 IMAGING — CT CT ABD-PELV W/ CM
1 of 3 series · 14 of 32 positions shown, 19 images · IV contrast (omnipaque)
Comparison: March 04, 2015

CLINICAL DATA: Generalized abdomen pain and distension starting
yesterday.

EXAM:
CT ABDOMEN AND PELVIS WITH CONTRAST
TECHNIQUE: Multidetector CT imaging of the abdomen and pelvis was performed
using the standard protocol following bolus administration of
intravenous contrast.
CONTRAST:  100mL OMNIPAQUE IOHEXOL 300 MG/ML  SOLN

[Series 2: routine abd pel with · axial · 0.73mm/px · z∈[-888,-453]mm · 14 of 99 slices shown, 19 images]
[im 6/99  soft-tissue]
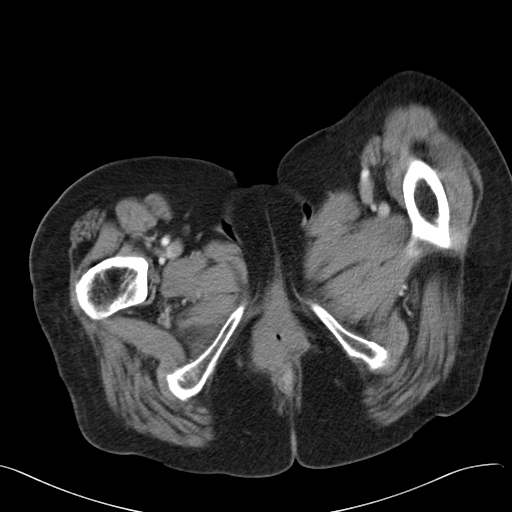
[im 6/99  bone]
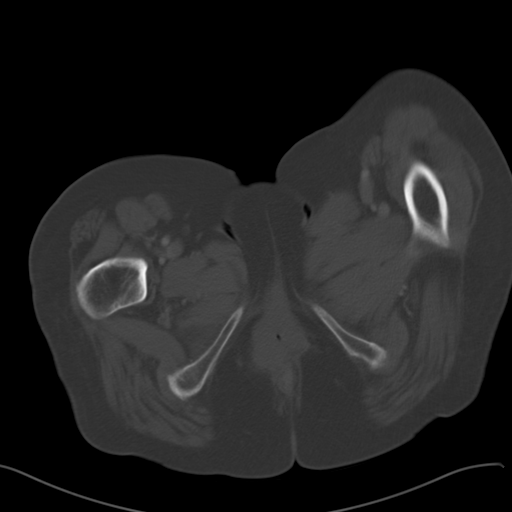
[im 16/99  soft-tissue]
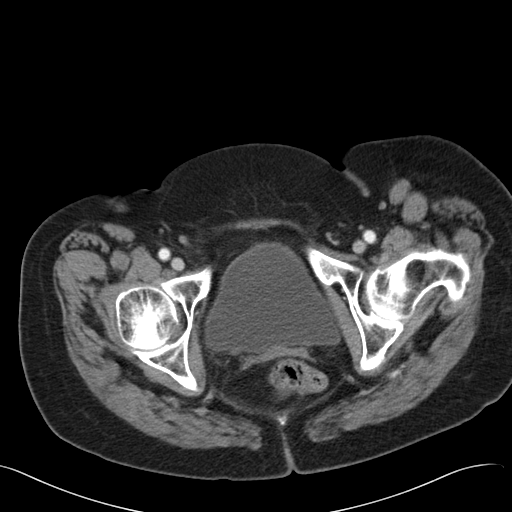
[im 21/99  soft-tissue]
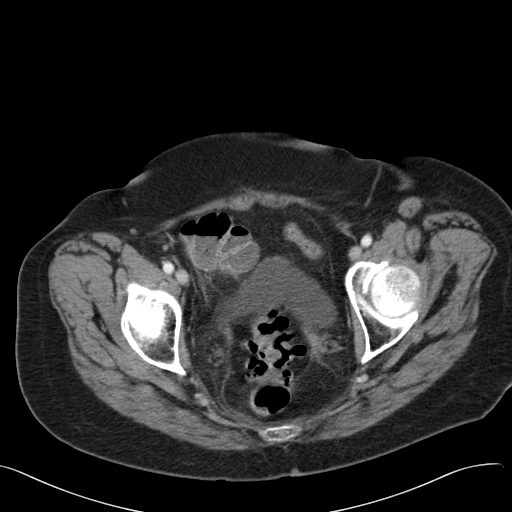
[im 26/99  soft-tissue]
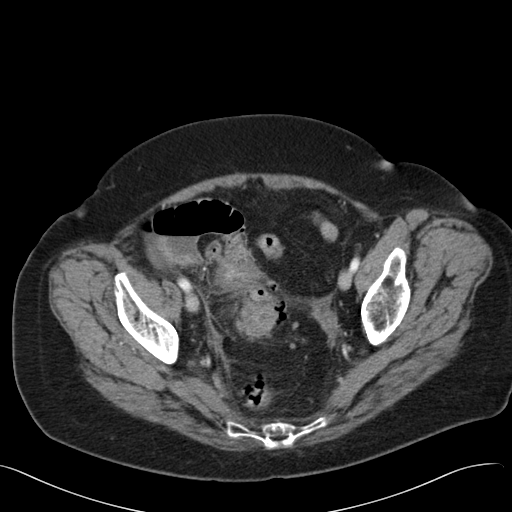
[im 37/99  soft-tissue]
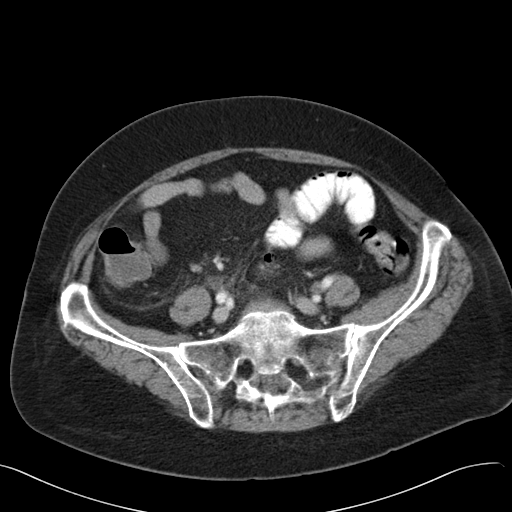
[im 42/99  soft-tissue]
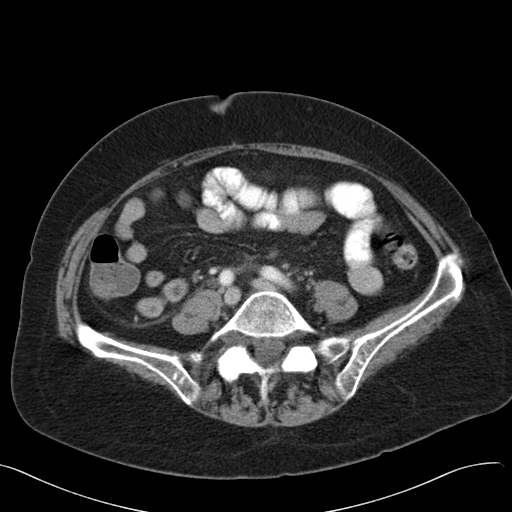
[im 52/99  soft-tissue]
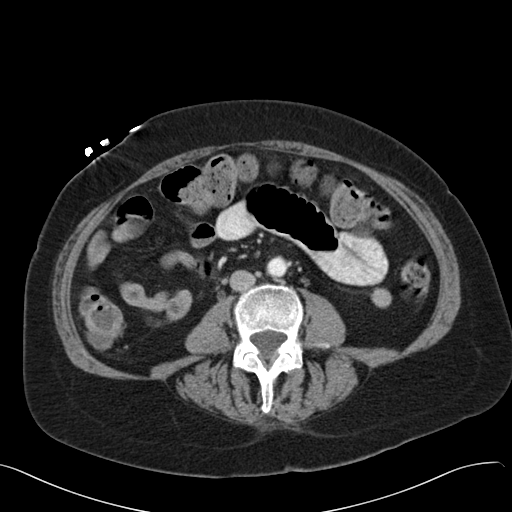
[im 57/99  soft-tissue]
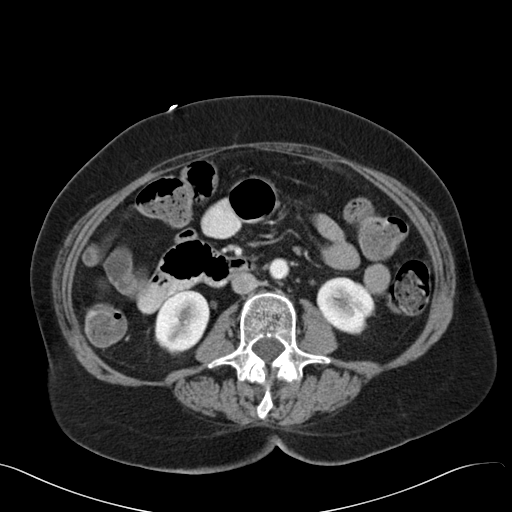
[im 62/99  soft-tissue]
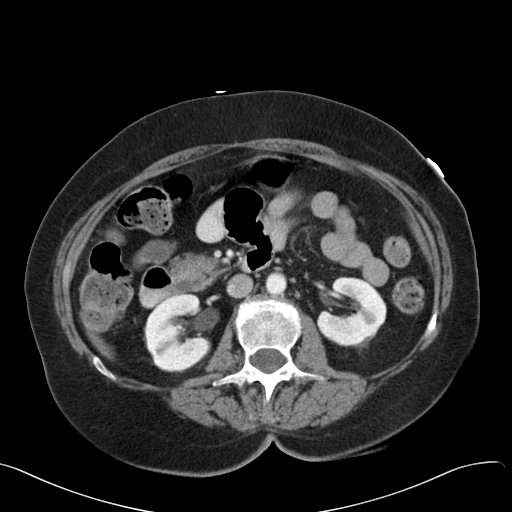
[im 62/99  bone]
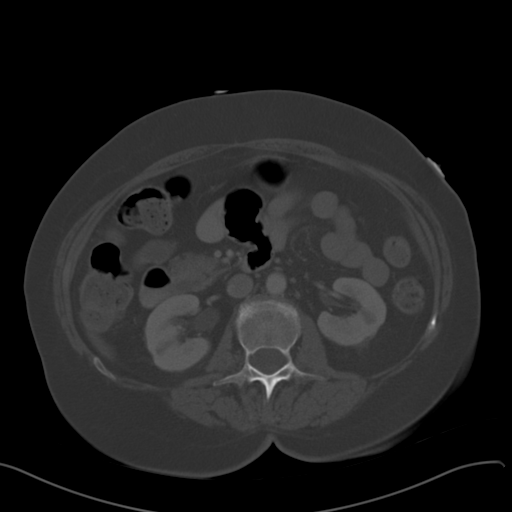
[im 73/99  soft-tissue]
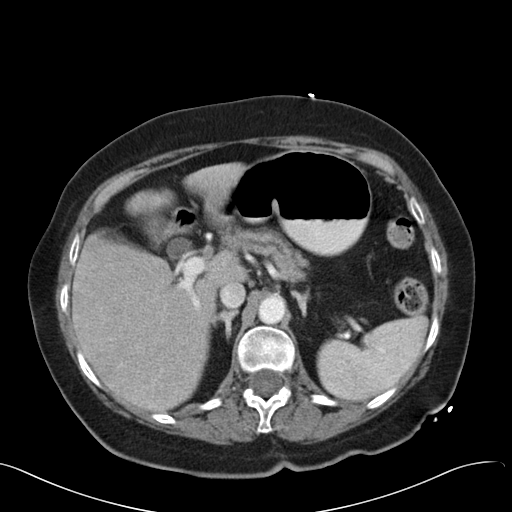
[im 78/99  soft-tissue]
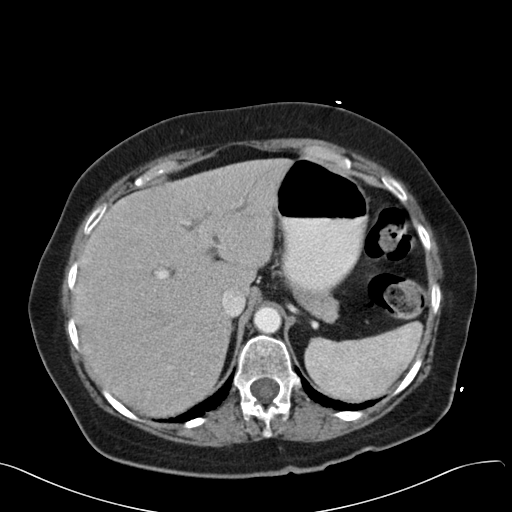
[im 78/99  lung]
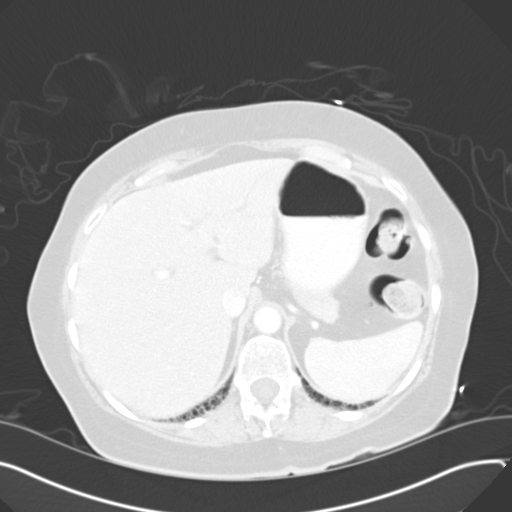
[im 83/99  soft-tissue]
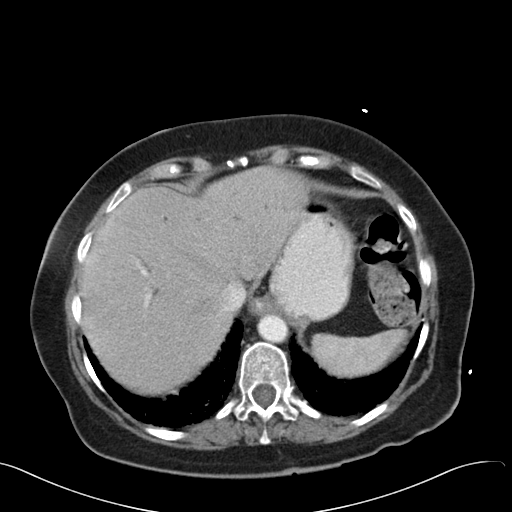
[im 83/99  lung]
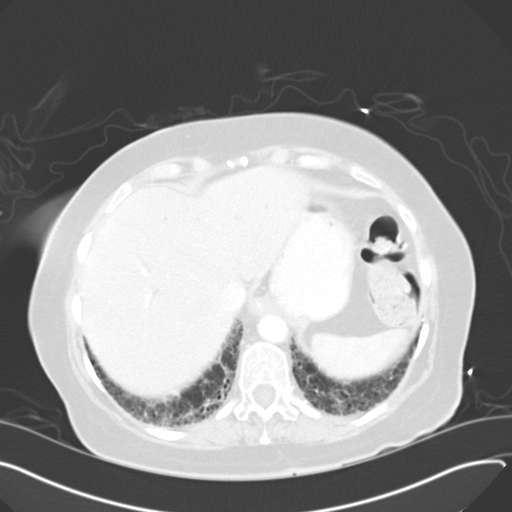
[im 88/99  lung]
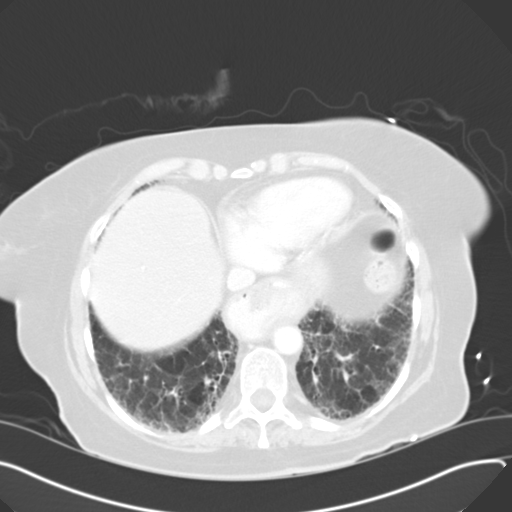
[im 93/99  soft-tissue]
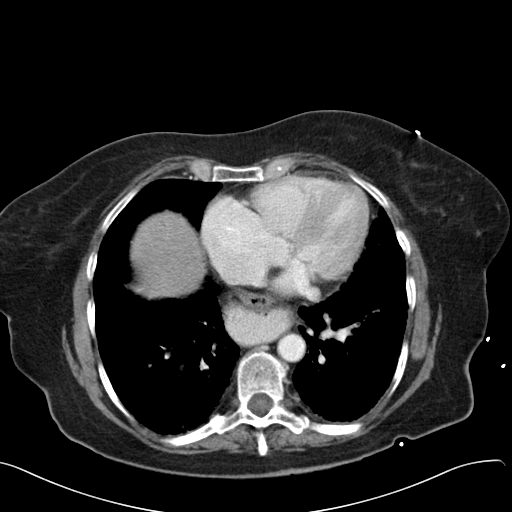
[im 93/99  lung]
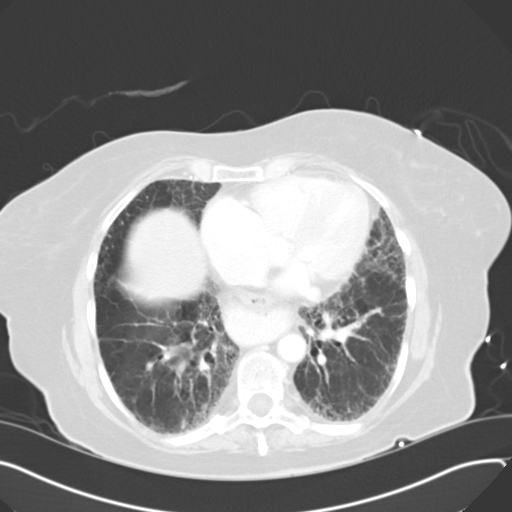

[14 of 32 positions shown; findings below may reference images not displayed]

FINDINGS: The liver is unchanged. There is no focal liver lesion. There is
postsurgical intra and extrahepatic biliary ductal dilatation.
Gallbladder is surgically absent. The spleen, pancreas, adrenal
glands are normal. There are small cysts in bilateral kidneys. There
is atherosclerosis of the abdominal aorta without aneurysmal
dilatation.

There is bowel wall thickening with surrounding inflammation in the
sigmoid colon. There is also bowel wall thickening with surrounding
inflammation of the adjacent distal cecum, with source of
inflammation probably arising from adjacent sigmoid colon. There is
no small bowel obstruction. The patient is status post prior
appendectomy.

Fluid-filled bladder is normal. Minimal free fluid is identified in
the pelvis. There are changes of pulmonary fibrosis. There is
chronic compression deformity of T8.
IMPRESSION: Sigmoid diverticulitis. There is also inflammation and bowel wall
thickening of the immediately adjacent cecum, with the source of
inflammation probably arising from the inflamed adjacent sigmoid
colon.

## 2016-10-24 ENCOUNTER — Inpatient Hospital Stay: Payer: Medicare Other | Admitting: Surgery

## 2016-10-26 ENCOUNTER — Emergency Department: Payer: MEDICARE

## 2016-10-26 ENCOUNTER — Inpatient Hospital Stay
Admission: EM | Admit: 2016-10-26 | Discharge: 2016-10-27 | DRG: 198 | Disposition: A | Discharge status: Home / Self Care | Payer: MEDICARE | Attending: Internal Medicine | Admitting: Internal Medicine

## 2016-10-26 ENCOUNTER — Encounter: Payer: Self-pay | Admitting: Radiology

## 2016-10-26 DIAGNOSIS — G47419 Narcolepsy without cataplexy: Secondary | ICD-10-CM | POA: Diagnosis not present

## 2016-10-26 DIAGNOSIS — J849 Interstitial pulmonary disease, unspecified: Secondary | ICD-10-CM | POA: Diagnosis present

## 2016-10-26 DIAGNOSIS — Z90710 Acquired absence of both cervix and uterus: Secondary | ICD-10-CM | POA: Diagnosis not present

## 2016-10-26 DIAGNOSIS — Z7901 Long term (current) use of anticoagulants: Secondary | ICD-10-CM | POA: Diagnosis not present

## 2016-10-26 DIAGNOSIS — Z833 Family history of diabetes mellitus: Secondary | ICD-10-CM | POA: Diagnosis not present

## 2016-10-26 DIAGNOSIS — I10 Essential (primary) hypertension: Secondary | ICD-10-CM | POA: Diagnosis present

## 2016-10-26 DIAGNOSIS — I480 Paroxysmal atrial fibrillation: Secondary | ICD-10-CM | POA: Diagnosis present

## 2016-10-26 DIAGNOSIS — Z961 Presence of intraocular lens: Secondary | ICD-10-CM | POA: Diagnosis present

## 2016-10-26 DIAGNOSIS — Z8249 Family history of ischemic heart disease and other diseases of the circulatory system: Secondary | ICD-10-CM | POA: Diagnosis not present

## 2016-10-26 DIAGNOSIS — Z803 Family history of malignant neoplasm of breast: Secondary | ICD-10-CM

## 2016-10-26 DIAGNOSIS — K219 Gastro-esophageal reflux disease without esophagitis: Secondary | ICD-10-CM | POA: Diagnosis present

## 2016-10-26 DIAGNOSIS — J8410 Pulmonary fibrosis, unspecified: Secondary | ICD-10-CM | POA: Diagnosis not present

## 2016-10-26 DIAGNOSIS — Z9841 Cataract extraction status, right eye: Secondary | ICD-10-CM

## 2016-10-26 DIAGNOSIS — R0609 Other forms of dyspnea: Secondary | ICD-10-CM

## 2016-10-26 DIAGNOSIS — R0602 Shortness of breath: Secondary | ICD-10-CM | POA: Diagnosis present

## 2016-10-26 DIAGNOSIS — Z888 Allergy status to other drugs, medicaments and biological substances status: Secondary | ICD-10-CM

## 2016-10-26 DIAGNOSIS — M810 Age-related osteoporosis without current pathological fracture: Secondary | ICD-10-CM | POA: Diagnosis not present

## 2016-10-26 DIAGNOSIS — E119 Type 2 diabetes mellitus without complications: Secondary | ICD-10-CM | POA: Diagnosis not present

## 2016-10-26 DIAGNOSIS — R0600 Dyspnea, unspecified: Secondary | ICD-10-CM | POA: Diagnosis present

## 2016-10-26 DIAGNOSIS — Z9842 Cataract extraction status, left eye: Secondary | ICD-10-CM

## 2016-10-26 DIAGNOSIS — E039 Hypothyroidism, unspecified: Secondary | ICD-10-CM | POA: Diagnosis not present

## 2016-10-26 LAB — CBC WITH DIFFERENTIAL/PLATELET
BASOS ABS: 0.1 10*3/uL (ref 0–0.1)
BASOS PCT: 1 %
Eosinophils Absolute: 0.2 10*3/uL (ref 0–0.7)
Eosinophils Relative: 2 %
HEMATOCRIT: 41.3 % (ref 35.0–47.0)
HEMOGLOBIN: 13.5 g/dL (ref 12.0–16.0)
LYMPHS PCT: 16 %
Lymphs Abs: 1.4 10*3/uL (ref 1.0–3.6)
MCH: 27.4 pg (ref 26.0–34.0)
MCHC: 32.7 g/dL (ref 32.0–36.0)
MCV: 83.9 fL (ref 80.0–100.0)
MONO ABS: 1.2 10*3/uL — AB (ref 0.2–0.9)
Monocytes Relative: 14 %
NEUTROS ABS: 5.7 10*3/uL (ref 1.4–6.5)
NEUTROS PCT: 67 %
Platelets: 303 10*3/uL (ref 150–440)
RBC: 4.92 MIL/uL (ref 3.80–5.20)
RDW: 13.7 % (ref 11.5–14.5)
WBC: 8.6 10*3/uL (ref 3.6–11.0)

## 2016-10-26 LAB — COMPREHENSIVE METABOLIC PANEL
ALBUMIN: 4.2 g/dL (ref 3.5–5.0)
ALK PHOS: 76 U/L (ref 38–126)
ALT: 24 U/L (ref 14–54)
AST: 37 U/L (ref 15–41)
Anion gap: 9 (ref 5–15)
BILIRUBIN TOTAL: 1.2 mg/dL (ref 0.3–1.2)
BUN: 16 mg/dL (ref 6–20)
CO2: 23 mmol/L (ref 22–32)
CREATININE: 1.02 mg/dL — AB (ref 0.44–1.00)
Calcium: 9.3 mg/dL (ref 8.9–10.3)
Chloride: 107 mmol/L (ref 101–111)
GFR calc Af Amer: >60 mL/min (ref >60)
GFR, EST NON AFRICAN AMERICAN: 52 mL/min — AB (ref >60)
GLUCOSE: 113 mg/dL — AB (ref 65–99)
POTASSIUM: 4.2 mmol/L (ref 3.5–5.1)
Sodium: 139 mmol/L (ref 135–145)
TOTAL PROTEIN: 7 g/dL (ref 6.5–8.1)

## 2016-10-26 LAB — INFLUENZA PANEL BY PCR (TYPE A & B)
INFLAPCR: NEGATIVE
Influenza B By PCR: NEGATIVE

## 2016-10-26 LAB — TROPONIN I: Troponin I: <0.03 ng/mL (ref <0.03)

## 2016-10-26 MED ORDER — IPRATROPIUM-ALBUTEROL 0.5-2.5 (3) MG/3ML IN SOLN
3.0000 mL | Freq: Once | RESPIRATORY_TRACT | Status: AC
  Administered 2016-10-26: 3 mL via RESPIRATORY_TRACT
  Filled 2016-10-26: qty 3

## 2016-10-26 MED ORDER — HYDRALAZINE HCL 20 MG/ML IJ SOLN
10.0000 mg | Freq: Once | INTRAMUSCULAR | Status: AC
  Administered 2016-10-26: 10 mg via INTRAVENOUS
  Filled 2016-10-26: qty 1

## 2016-10-26 MED ORDER — DEXTROSE 5 % IV SOLN
500.0000 mg | Freq: Once | INTRAVENOUS | Status: AC
  Administered 2016-10-26: 500 mg via INTRAVENOUS
  Filled 2016-10-26: qty 500

## 2016-10-26 MED ORDER — METHYLPREDNISOLONE SODIUM SUCC 125 MG IJ SOLR
125.0000 mg | Freq: Once | INTRAMUSCULAR | Status: AC
  Administered 2016-10-26: 125 mg via INTRAVENOUS
  Filled 2016-10-26: qty 2

## 2016-10-26 MED ORDER — LABETALOL HCL 5 MG/ML IV SOLN
INTRAVENOUS | Status: AC
  Filled 2016-10-26: qty 4

## 2016-10-26 MED ORDER — IOPAMIDOL (ISOVUE-370) INJECTION 76%
75.0000 mL | Freq: Once | INTRAVENOUS | Status: AC | PRN
  Administered 2016-10-26: 75 mL via INTRAVENOUS

## 2016-10-26 MED ORDER — DEXTROSE 5 % IV SOLN: 1.0000 g | Freq: Once | INTRAVENOUS | Status: DC

## 2016-10-26 MED ORDER — CEFTRIAXONE SODIUM-DEXTROSE 1-3.74 GM-% IV SOLR
1.0000 g | Freq: Once | INTRAVENOUS | Status: AC
  Administered 2016-10-26: 1 g via INTRAVENOUS
  Filled 2016-10-26: qty 50

## 2016-10-26 MED ORDER — SODIUM CHLORIDE 0.9 % IV BOLUS (SEPSIS)
500.0000 mL | Freq: Once | INTRAVENOUS | Status: AC
Start: 2016-10-26 — End: 2016-10-26
  Administered 2016-10-26: 500 mL via INTRAVENOUS

## 2016-10-26 MED ORDER — LABETALOL HCL 5 MG/ML IV SOLN
10.0000 mg | INTRAVENOUS | Status: DC | PRN
  Administered 2016-10-26: 10 mg via INTRAVENOUS
  Filled 2016-10-26: qty 4

## 2016-10-26 NOTE — ED Notes (Signed)
ED MD at bedside updating patient.

## 2016-10-26 NOTE — ED Notes (Signed)
Patient transported to CT

## 2016-10-26 NOTE — ED Provider Notes (Signed)
[facility] Emergency Department Provider Note  ____________________________________________  Time seen: Approximately 7:25 PM  I have reviewed the triage vital signs and the nursing notes.   HISTORY  Chief Complaint Shortness of Breath and Cough   HPI ANALENA GAMA is a 76 y.o. female with a history of atrial fibrillation, pulmonary fibrosis, and hypertension who presents for evaluation of shortness of breath. Patient reports for the last 2 weeks she has had progressively worsening shortness of breath. Initially with exertion now at rest as well. She does not use oxygen at home. She also has had a cough productive of yellow sputum for a few months according to her patient. Patient is brought in by her son today because of the significant dyspnea that he has noticed in the last 2 days since coming here to visit his mother. She denies fever or chills, nausea or vomiting, diarrhea, chest pain, abdominal pain, leg pain or swelling. She denies personal or family history of blood clots, recent travel or immobilization, leg pain or swelling, hemoptysis, exogenous hormones. She denies personal or family history of ischemic heart disease. She is not a smoker.  Past Medical History:  Diagnosis Date  . A-fib ([place])   . Chronic diarrhea   . Diverticulitis   . GERD (gastroesophageal reflux disease)    usually takes protonix  . Hip fracture ([place]) 11/2015   hairline fracture on right.  no surgery, just physical therapy  . Hypertension   . IBS (irritable bowel syndrome)   . IBS (irritable bowel syndrome)   . Interstitial lung disease ([place])    does not remember when  . Seasonal affective disorder ([place])   . Shortness of breath dyspnea    with exertion  . Type 2 diabetes mellitus ([place]) 11/04/2014    Patient Active Problem List   Diagnosis Date Noted  . Diverticulitis of large intestine with abscess without bleeding 08/31/2016  . Interstitial lung disease ([place])  08/31/2016  . Diverticulitis of large intestine with abscess 08/31/2016  . 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 DOE (dyspnea on exertion) 01/26/2016  . Acute bronchitis 11/29/2015  . Acute respiratory failure with hypoxia ([place]) 11/29/2015  . Fracture of greater trochanter of right femur ([place]) 11/29/2015  . Fall 11/26/2015  . OP (osteoporosis) 07/18/2015  . 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 Adult hypothyroidism 07/18/2015  . BP (high blood pressure) 07/18/2015  . Fatigue 07/18/2015  . Anxiety 07/18/2015  . Diverticulitis large intestine 07/04/2015  . Paroxysmal atrial fibrillation ([place]) 04/22/2015  . Clinical depression 04/22/2015  . Atrial fibrillation with rapid ventricular response ([place]) 03/25/2015  . Atrial fibrillation ([place]) 03/25/2015  . Acquired hypothyroidism 01/11/2015  . Anemia, iron deficiency 01/10/2015  . Benign essential HTN 01/10/2015  . Type 2 diabetes mellitus ([place]) 11/04/2014  . 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 Essential (primary) hypertension 11/04/2014  . Temporary cerebral vascular dysfunction 04/08/2014  . Dry mouth 01/05/2013  . Cannot sleep 01/05/2013  . Obstructive apnea 09/02/2012  . Bursitis, ischial 05/19/2012  . Acid reflux 02/01/2012  . DD (diverticular disease) 08/14/2011    Past Surgical History:  Procedure Laterality Date  . ABDOMINAL HYSTERECTOMY     partial  . APPENDECTOMY    . CATARACT EXTRACTION W/PHACO Right 03/16/2015   Procedure: CATARACT EXTRACTION PHACO AND INTRAOCULAR LENS PLACEMENT (IOC);  Surgeon: Leandrew Koyanagi, MD;  Location: [place];  Service: Ophthalmology;  Laterality: Right;  . CATARACT  EXTRACTION W/PHACO Left 06/13/2016   Procedure: CATARACT EXTRACTION PHACO AND INTRAOCULAR LENS PLACEMENT (IOC);  Surgeon: Leandrew Koyanagi, MD;  Location: [place];  Service: Ophthalmology;  Laterality: Left;   pre-diabetic - diet controlled  . COLONOSCOPY    . COLONOSCOPY WITH PROPOFOL N/A 05/25/2016   Procedure: COLONOSCOPY WITH PROPOFOL;  Surgeon: Manya Silvas, MD;  Location: [facility] ENDOSCOPY;  Service: Endoscopy;  Laterality: N/A;  . ESOPHAGOGASTRODUODENOSCOPY (EGD) WITH PROPOFOL N/A 05/25/2016   Procedure: ESOPHAGOGASTRODUODENOSCOPY (EGD) WITH PROPOFOL;  Surgeon: Manya Silvas, MD;  Location: [facility] ENDOSCOPY;  Service: Endoscopy;  Laterality: N/A;  . NISSEN FUNDOPLICATION  4174   came apart in 2016 and ibs and reflux returned  . OPEN REDUCTION INTERNAL FIXATION (ORIF) DISTAL RADIAL FRACTURE Right 01/13/2016   Procedure: OPEN REDUCTION INTERNAL FIXATION (ORIF) DISTAL RADIAL FRACTURE;  Surgeon: Hessie Knows, MD;  Location: [facility] ORS;  Service: Orthopedics;  Laterality: Right;  . TONSILLECTOMY     and adenoidectomy    Prior to Admission medications   Medication Sig Start Date End Date Taking? Authorizing Provider  amoxicillin-clavulanate (AUGMENTIN) 875-125 MG tablet Take 1 tablet by mouth every 12 (twelve) hours. 09/03/16   Diego F Pabon, MD  apixaban (ELIQUIS) 5 MG TABS tablet Take 5 mg by mouth 2 (two) times daily.    Historical Provider, MD  Calcium Carbonate-Vitamin D (CALCIUM 600+D) 600-200 MG-UNIT TABS Take 1 tablet by mouth daily.    Historical Provider, MD  cholestyramine Lucrezia Starch) 4 GM/DOSE powder Take 1 packet by mouth 4 (four) times daily. 09/12/15   Historical Provider, MD  colestipol (COLESTID) 1 g tablet  06/19/16   Historical Provider, MD  diltiazem (CARDIZEM CD) 120 MG 24 hr capsule Take 1 capsule (120 mg total) by mouth daily. Patient not taking: Reported on 08/31/2016 11/29/15   Hillary Bow, MD  FLUoxetine (PROZAC) 20 MG capsule Take 20 mg by mouth daily.    Historical Provider, MD  hydrALAZINE (APRESOLINE) 25 MG tablet Take 25 mg by mouth 3 (three) times daily.    Historical Provider, MD  HYDROcodone-acetaminophen (NORCO/VICODIN) 5-325 MG tablet  08/14/16   Historical Provider,  MD  ipratropium-albuterol (DUONEB) 0.5-2.5 (3) MG/3ML SOLN Take 3 mLs by nebulization every 4 (four) hours as needed. Patient not taking: Reported on 08/31/2016 11/29/15   Hillary Bow, MD  levothyroxine (SYNTHROID, LEVOTHROID) 50 MCG tablet Take 50 mcg by mouth daily before breakfast.    Historical Provider, MD  losartan (COZAAR) 100 MG tablet Take 100 mg by mouth daily.     Historical Provider, MD  metoprolol succinate (TOPROL-XL) 50 MG 24 hr tablet Take 50 mg by mouth daily.    Historical Provider, MD  Multiple Vitamin (MULTIVITAMIN WITH MINERALS) TABS tablet Take 1 tablet by mouth daily.    Historical Provider, MD  ondansetron (ZOFRAN ODT) 4 MG disintegrating tablet Take 1 tablet (4 mg total) by mouth every 8 (eight) hours as needed for nausea or vomiting. 11/15/15   Loney Hering, MD  pantoprazole (PROTONIX) 40 MG tablet Take 1 tablet (40 mg total) by mouth daily. 03/29/15   Glendon Axe, MD  ranitidine (ZANTAC) 300 MG tablet Take 300 mg by mouth daily.    Historical Provider, MD  sotalol (BETAPACE) 80 MG tablet Take 1 tablet (80 mg total) by mouth every 12 (twelve) hours. 03/29/15   Glendon Axe, MD  sulfamethoxazole-trimethoprim (BACTRIM DS,SEPTRA DS) 800-160 MG tablet  07/20/16   Historical Provider, MD    Allergies Amlodipine and Nexium [esomeprazole magnesium]  Family History  Problem Relation Age of Onset  . Breast cancer Mother   . Diabetes Mellitus II Mother   . Hypothyroidism Mother   . Atrial fibrillation Mother   . Heart attack Father     Social History Social History  Substance Use Topics  . Smoking status: Never Smoker  . Smokeless tobacco: Never Used     Comment: no passive smokers in home  . Alcohol use No    Review of Systems  Constitutional: Negative for fever. Eyes: Negative for visual changes. ENT: Negative for sore throat. Neck: No neck pain  Cardiovascular: Negative for chest pain. Respiratory: + shortness of breath. Gastrointestinal: Negative  for abdominal pain, vomiting or diarrhea. Genitourinary: Negative for dysuria. Musculoskeletal: Negative for back pain. Skin: Negative for rash. Neurological: Negative for headaches, weakness or numbness. Psych: No SI or HI  ____________________________________________   PHYSICAL EXAM:  VITAL SIGNS: ED Triage Vitals [10/26/16 1614]  Enc Vitals Group     BP (!) 154/77     Pulse Rate 86     Resp 18     Temp 97.8 F (36.6 C)     Temp Source Oral     SpO2 93 %     Weight 150 lb (68 kg)     Height _0  (1.626 m)     Head Circumference      Peak Flow      Pain Score 8     Pain Loc      Pain Edu?      Excl. in [place]?     Constitutional: Alert and oriented. Well appearing and in no apparent distress. HEENT:      Head: Normocephalic and atraumatic.         Eyes: Conjunctivae are normal. Sclera is non-icteric. EOMI. PERRL      Mouth/Throat: Mucous membranes are moist.       Neck: Supple with no signs of meningismus. Cardiovascular: Regular rate and rhythm. No murmurs, gallops, or rubs. 2+ symmetrical distal pulses are present in all extremities. No JVD. Respiratory: Normal respiratory effort. Lungs are clear to auscultation bilaterally with b/l crackles, no wheezing. Gastrointestinal: Soft, non tender, and non distended with positive bowel sounds. No rebound or guarding. Musculoskeletal: Nontender with normal range of motion in all extremities. No edema, cyanosis, or erythema of extremities. Neurologic: Normal speech and language. Face is symmetric. Moving all extremities. No gross focal neurologic deficits are appreciated. Skin: Skin is warm, dry and intact. No rash noted. Psychiatric: Mood and affect are normal. Speech and behavior are normal.  ____________________________________________   LABS (all labs ordered are listed, but only abnormal results are displayed)  Labs Reviewed  CBC WITH DIFFERENTIAL/PLATELET - Abnormal; Notable for the following:       Result Value    Monocytes Absolute 1.2 (*)    All other components within normal limits  COMPREHENSIVE METABOLIC PANEL - Abnormal; Notable for the following:    Glucose, Bld 113 (*)    Creatinine, Ser 1.02 (*)    GFR calc non Af Amer 52 (*)    All other components within normal limits  INFLUENZA PANEL BY PCR (TYPE A & B)  TROPONIN I   ____________________________________________  EKG  ED ECG REPORT I, Rudene Re, the attending physician, personally viewed and interpreted this ECG.  Normal sinus rhythm, rate of 82, normal intervals, normal axis, T-wave inversions in inferior and lateral leads which are unchanged from prior, no ST elevation. ____________________________________________  RADIOLOGY  CXR: NO acute findigns  ____________________________________________   PROCEDURES  Procedure(s) performed: None Procedures Critical Care performed:  None ____________________________________________   INITIAL IMPRESSION / ASSESSMENT AND PLAN / ED COURSE   76 y.o. female with a history of atrial fibrillation, pulmonary fibrosis, and hypertension who presents for evaluation of progressively worsening dyspnea on exertion now with shortness of breath at rest as well. She has had a productive cough for the last few months but no other flulike symptoms. However since flu has been very prominent this community we'll check her for flu. Chest x-ray with no evidence of pneumonia. I do not hear wheezing however patient reports that initially her shortness of breath was improving with albuterol at home so we'll try a DuoNeb treatment. We'll also cycle her cardiac enzymes since patient has an abnormal EKG although this is unchanged from prior. Patient has no oxygen requirement at this time at rest and has normal vital signs. Low threshold to scan patient's chest to rule out a pulmonary embolism.  Clinical Course as of Oct 26 2045  Ludwig Clarks Oct 26, 2016  2013 Chest x-ray with no evidence of pneumonia. Troponin is  negative. Flu is negative. Patient denies any improvement of her shortness of breath with a DuoNeb treatment. We'll do a CT of the chest to rule out PE.  [CV]  2032 Patient ambulated and maintained her sats in the upper 90s. No tachycardia. She did feel very tired. CT is pending. That's negative will discharge home and follow-up with pulmonologist. Care transferred to Dr. Corky Downs.  [CV]    Clinical Course User Index [CV] Rudene Re, MD    Pertinent labs & imaging results that were available during my care of the patient were reviewed by me and considered in my medical decision making (see chart for details).    ____________________________________________   FINAL CLINICAL IMPRESSION(S) / ED DIAGNOSES  Final diagnoses:  DOE (dyspnea on exertion)  Pulmonary fibrosis (HCC)      NEW MEDICATIONS STARTED DURING THIS VISIT:  New Prescriptions   No medications on file     Note:  This document was prepared using Dragon voice recognition software and may include unintentional dictation errors.    Rudene Re, MD 10/26/16 2047

## 2016-10-26 NOTE — ED Notes (Signed)
Patient reports she did not take any blood pressure medication at home today.  MD made aware.

## 2016-10-26 NOTE — ED Notes (Signed)
Admitting MD at bedside.

## 2016-10-26 NOTE — ED Provider Notes (Signed)
CT scan does not demonstrate PE. Influenza is negative, however patient still becomes extremely  short of breath with any ambulation. Discussed with Dr. Jannifer Franklin of hospital service who will admit the patient   Lavonia Drafts, MD 10/26/16 2153

## 2016-10-26 NOTE — ED Triage Notes (Signed)
Pt arrives with reports of increased shortness of breath over the last two weeks  History of pulmonary fibrosis

## 2016-10-26 NOTE — H&P (Signed)
[facility] NAME: Julie Mcmillan    MR#:  [id number]  DATE OF BIRTH:  [birth year]  DATE OF ADMISSION:  10/26/2016  PRIMARY CARE PHYSICIAN: Glendon Axe, MD   REQUESTING/REFERRING PHYSICIAN: Corky Downs, MD  CHIEF COMPLAINT:   Chief Complaint  Patient presents with  . Shortness of Breath  . Cough    HISTORY OF PRESENT ILLNESS:  Julie Mcmillan  is a 76 y.o. female who presents with 2-3 days of progressive shortness of breath. She states that this specifically dyspnea on exertion, has reached a point where she is unable to get up out of her bed and do much of anything at all. Even just a short walk from her bed to her door makes her too short of breath. Here in the ED her workup was largely within normal limits in terms of acute infection, right blood cell count is normal, she was flu negative, and her imaging does not show any definitive pneumonia or other signs of pulmonary infection. 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 This is not a new diagnosis as the patient has had this for an extended period of time. However, she has not been taking any treatment for it for about the past year, and has had difficulty several times with getting to her appointments with her pulmonologist. Patient was planning to pursue an appointment with a pulmonologist closer to her home in order to help make it easier to make it to her appointments. Given her profound dyspnea on exertion and how much her O2 sats drop when she gets up even just to move around her ED room, hospitals were called for admission and further evaluation.  PAST MEDICAL HISTORY:   Past Medical History:  Diagnosis Date  . A-fib ([place])   . Chronic diarrhea   . Diverticulitis   . GERD (gastroesophageal reflux disease)    usually takes protonix  . Hip fracture ([place]) 11/2015   hairline fracture on right.  no surgery, just physical therapy  . Hypertension   . IBS  (irritable bowel syndrome)   . IBS (irritable bowel syndrome)   . Interstitial lung disease ([place])    does not remember when  . Seasonal affective disorder ([place])   . Shortness of breath dyspnea    with exertion  . Type 2 diabetes mellitus ([place]) 11/04/2014    PAST SURGICAL HISTORY:   Past Surgical History:  Procedure Laterality Date  . ABDOMINAL HYSTERECTOMY     partial  . APPENDECTOMY    . CATARACT EXTRACTION W/PHACO Right 03/16/2015   Procedure: CATARACT EXTRACTION PHACO AND INTRAOCULAR LENS PLACEMENT (IOC);  Surgeon: Leandrew Koyanagi, MD;  Location: [facility];  Service: Ophthalmology;  Laterality: Right;  . CATARACT EXTRACTION W/PHACO Left 06/13/2016   Procedure: CATARACT EXTRACTION PHACO AND INTRAOCULAR LENS PLACEMENT (IOC);  Surgeon: Leandrew Koyanagi, MD;  Location: [facility];  Service: Ophthalmology;  Laterality: Left;  pre-diabetic - diet controlled  . COLONOSCOPY    . COLONOSCOPY WITH PROPOFOL N/A 05/25/2016   Procedure: COLONOSCOPY WITH PROPOFOL;  Surgeon: Manya Silvas, MD;  [facility] ENDOSCOPY;  Service: Endoscopy;  Laterality: N/A;  . ESOPHAGOGASTRODUODENOSCOPY (EGD) WITH PROPOFOL N/A 05/25/2016   Procedure: ESOPHAGOGASTRODUODENOSCOPY (EGD) WITH PROPOFOL;  Surgeon: Manya Silvas, MD;  Location[facility] ENDOSCOPY;  Service: Endoscopy;  Laterality: N/A;  . NISSEN FUNDOPLICATION  7616   came apart in 2016 and ibs and reflux returned  . OPEN REDUCTION INTERNAL FIXATION (ORIF)  DISTAL RADIAL FRACTURE Right 01/13/2016   Procedure: OPEN REDUCTION INTERNAL FIXATION (ORIF) DISTAL RADIAL FRACTURE;  Surgeon: Hessie Knows, MD;  Location: [facility] ORS;  Service: Orthopedics;  Laterality: Right;  . TONSILLECTOMY     and adenoidectomy    SOCIAL HISTORY:   Social History  Substance Use Topics  . Smoking status: Never Smoker  . Smokeless tobacco: Never Used     Comment: no passive smokers in home  . Alcohol use No    FAMILY HISTORY:   Family History   Problem Relation Age of Onset  . Breast cancer Mother   . Diabetes Mellitus II Mother   . Hypothyroidism Mother   . Atrial fibrillation Mother   . Heart attack Father     DRUG ALLERGIES:   Allergies  Allergen Reactions  . Amlodipine Swelling  . Nexium [Esomeprazole Magnesium] Diarrhea    MEDICATIONS AT HOME:   Prior to Admission medications   Medication Sig Start Date End Date Taking? Authorizing Provider  amoxicillin-clavulanate (AUGMENTIN) 875-125 MG tablet Take 1 tablet by mouth every 12 (twelve) hours. 09/03/16   Diego F Pabon, MD  apixaban (ELIQUIS) 5 MG TABS tablet Take 5 mg by mouth 2 (two) times daily.    Historical Provider, MD  Calcium Carbonate-Vitamin D (CALCIUM 600+D) 600-200 MG-UNIT TABS Take 1 tablet by mouth daily.    Historical Provider, MD  cholestyramine Lucrezia Starch) 4 GM/DOSE powder Take 1 packet by mouth 4 (four) times daily. 09/12/15   Historical Provider, MD  colestipol (COLESTID) 1 g tablet  06/19/16   Historical Provider, MD  diltiazem (CARDIZEM CD) 120 MG 24 hr capsule Take 1 capsule (120 mg total) by mouth daily. Patient not taking: Reported on 08/31/2016 11/29/15   Hillary Bow, MD  FLUoxetine (PROZAC) 20 MG capsule Take 20 mg by mouth daily.    Historical Provider, MD  hydrALAZINE (APRESOLINE) 25 MG tablet Take 25 mg by mouth 3 (three) times daily.    Historical Provider, MD  HYDROcodone-acetaminophen (NORCO/VICODIN) 5-325 MG tablet  08/14/16   Historical Provider, MD  ipratropium-albuterol (DUONEB) 0.5-2.5 (3) MG/3ML SOLN Take 3 mLs by nebulization every 4 (four) hours as needed. Patient not taking: Reported on 08/31/2016 11/29/15   Hillary Bow, MD  levothyroxine (SYNTHROID, LEVOTHROID) 50 MCG tablet Take 50 mcg by mouth daily before breakfast.    Historical Provider, MD  losartan (COZAAR) 100 MG tablet Take 100 mg by mouth daily.     Historical Provider, MD  metoprolol succinate (TOPROL-XL) 50 MG 24 hr tablet Take 50 mg by mouth daily.    Historical  Provider, MD  Multiple Vitamin (MULTIVITAMIN WITH MINERALS) TABS tablet Take 1 tablet by mouth daily.    Historical Provider, MD  ondansetron (ZOFRAN ODT) 4 MG disintegrating tablet Take 1 tablet (4 mg total) by mouth every 8 (eight) hours as needed for nausea or vomiting. 11/15/15   Loney Hering, MD  pantoprazole (PROTONIX) 40 MG tablet Take 1 tablet (40 mg total) by mouth daily. 03/29/15   Glendon Axe, MD  ranitidine (ZANTAC) 300 MG tablet Take 300 mg by mouth daily.    Historical Provider, MD  sotalol (BETAPACE) 80 MG tablet Take 1 tablet (80 mg total) by mouth every 12 (twelve) hours. 03/29/15   Glendon Axe, MD  sulfamethoxazole-trimethoprim (BACTRIM DS,SEPTRA DS) 800-160 MG tablet  07/20/16   Historical Provider, MD    REVIEW OF SYSTEMS:  Review of Systems  Constitutional: Negative for chills, fever, malaise/fatigue and weight loss.  HENT: Negative for ear pain,  hearing loss and tinnitus.   Eyes: Negative for blurred vision, double vision, pain and redness.  Respiratory: Positive for shortness of breath. Negative for cough and hemoptysis.   Cardiovascular: Negative for chest pain, palpitations, orthopnea and leg swelling.  Gastrointestinal: Negative for abdominal pain, constipation, diarrhea, nausea and vomiting.  Genitourinary: Negative for dysuria, frequency and hematuria.  Musculoskeletal: Negative for back pain, joint pain and neck pain.  Skin:       No acne, rash, or lesions  Neurological: Negative for dizziness, tremors, focal weakness and weakness.  Endo/Heme/Allergies: Negative for polydipsia. Does not bruise/bleed easily.  Psychiatric/Behavioral: Negative for depression. The patient is not nervous/anxious and does not have insomnia.      VITAL SIGNS:   Vitals:   10/26/16 2042 10/26/16 2050 10/26/16 2106 10/26/16 2121  BP: (!) 205/90 (!) 205/90 (!) 189/84 (!) 198/92  Pulse: 71  87 94  Resp:      Temp:      TempSrc:      SpO2: 99%  99% 94%  Weight:      Height:        Wt Readings from Last 3 Encounters:  10/26/16 68 kg (150 lb)  09/02/16 71.2 kg (157 lb)  06/13/16 66.7 kg (147 lb)    PHYSICAL EXAMINATION:  Physical Exam  Vitals reviewed. Constitutional: She is oriented to person, place, and time. She appears well-developed and well-nourished. No distress.  HENT:  Head: Normocephalic and atraumatic.  Mouth/Throat: Oropharynx is clear and moist.  Eyes: Conjunctivae and EOM are normal. Pupils are equal, round, and reactive to light. No scleral icterus.  Neck: Normal range of motion. Neck supple. No JVD present. No thyromegaly present.  Cardiovascular: Normal rate, regular rhythm and intact distal pulses.  Exam reveals no gallop and no friction rub.   No murmur heard. Respiratory: Effort normal. No respiratory distress. She has no wheezes. She has rales (bibasilar).  GI: Soft. Bowel sounds are normal. She exhibits no distension. There is no tenderness.  Musculoskeletal: Normal range of motion. She exhibits no edema.  No arthritis, no gout  Lymphadenopathy:    She has no cervical adenopathy.  Neurological: She is alert and oriented to person, place, and time. No cranial nerve deficit.  No dysarthria, no aphasia  Skin: Skin is warm and dry. No rash noted. No erythema.  Psychiatric: She has a normal mood and affect. Her behavior is normal. Judgment and thought content normal.    LABORATORY PANEL:   CBC  Recent Labs Lab 10/26/16 1650  WBC 8.6  HGB 13.5  HCT 41.3  PLT 303   ------------------------------------------------------------------------------------------------------------------  Chemistries   Recent Labs Lab 10/26/16 1650  NA 139  K 4.2  CL 107  CO2 23  GLUCOSE 113*  BUN 16  CREATININE 1.02*  CALCIUM 9.3  AST 37  ALT 24  ALKPHOS 76  BILITOT 1.2   ------------------------------------------------------------------------------------------------------------------  Cardiac Enzymes  Recent Labs Lab 10/26/16 1650   TROPONINI <0.03   ------------------------------------------------------------------------------------------------------------------  RADIOLOGY:  Dg Chest 2 View  Result Date: 10/26/2016 CLINICAL DATA:  Shortness of breath x 2-3 weeks, history of pulmonary fibrosis EXAM: CHEST  2 VIEW COMPARISON:  08/31/2016 FINDINGS: Chronic fissure markings with subpleural reticulation/ fibrosis, compatible with chronic interstitial lung disease. No superimposed opacities suspicious for pneumonia. No pleural effusion or pneumothorax. The heart is normal in size. Moderate compression fracture deformity of a mid thoracic vertebral body, chronic. IMPRESSION: Stable chronic interstitial lung disease. No evidence of acute cardiopulmonary disease. Electronically Signed  By: Julian Hy M.D.   On: 10/26/2016 17:21   Ct Angio Chest Pe W And/or Wo Contrast  Result Date: 10/26/2016 CLINICAL DATA:  Shortness of breath EXAM: CT ANGIOGRAPHY CHEST WITH CONTRAST TECHNIQUE: Multidetector CT imaging of the chest was performed using the standard protocol during bolus administration of intravenous contrast. Multiplanar CT image reconstructions and MIPs were obtained to evaluate the vascular anatomy. CONTRAST:  75 cc Isovue 370 COMPARISON:  Chest x-ray earlier same day.  Chest CT 07/08/2015 FINDINGS: Cardiovascular: Heart is enlarged. Trace pericardial fluid or thickening noted. Atherosclerotic calcification is noted in the wall of the thoracic aorta. Right main pulmonary artery upper normal diameter at 2.7 cm. No filling defects in the opacified pulmonary arteries to suggest the presence of an acute pulmonary embolus. Mediastinum/Nodes: 1.5 cm short axis precarinal lymph node was 1.6 cm previously. 1.5 cm short axis left hilar lymph node was 1.8 cm previously. 1.5 cm short axis right hilar lymph node was 2.0 cm previously. The esophagus has normal imaging features. There is no axillary lymphadenopathy. Lungs/Pleura: Underlying  chronic interstitial lung disease again noted areas of subpleural honeycombing were slightly more prominent on prior imaging then current study. Scattered areas of mosaic attenuation likely related to small airway disease. Scattered bronchiectasis and airspace opacity is noted bilaterally. Upper Abdomen: Small to moderate hiatal hernia. Musculoskeletal: Bone windows reveal no worrisome lytic or sclerotic osseous lesions. Review of the MIP images confirms the above findings. IMPRESSION: 1. No CT evidence for acute pulmonary embolus. 2. Interval progression of interstitial lung disease. 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 3. Slight decrease in mediastinal and hilar lymphadenopathy, likely reactive. Electronically Signed   By: Misty Stanley M.D.   On: 10/26/2016 21:26    EKG:   Orders placed or performed during the hospital encounter of 10/26/16  . EKG 12-Lead  . EKG 12-Lead  . ED EKG  . ED EKG    IMPRESSION AND PLAN:  Principal Problem:   DOE (dyspnea on exertion) - unclear etiology upfront. Patient likely has poor pulmonary reserve given her interstitial lung disease. No definitive evidence of pneumonia tonight on initial workup, though this diagnosis could not be completely excluded. Patient was fluid negative, but could have some other respiratory viruses well. Or this could simply be a flare of her interstitial lung disease, or environmentally triggered. Either way we will treat the patient upfront with some IV antibiotics and some IV steroids with when necessary DuoNeb's. We'll get a pulmonology consult Active Problems:   Interstitial lung disease ([place]) - treatment as above   Type 2 diabetes mellitus ([place]) - sliding scale insulin with corresponding glucose checks   Paroxysmal atrial fibrillation ([place]) - continue home meds   Benign essential HTN - currently elevated here, however she did not take her home meds today. Restart home meds and will have additional when  necessary antihypertensives in place for goal blood pressure less than 160/100   Adult hypothyroidism - home dose thyroid replacement   Acid reflux - continue home meds  All the records are reviewed and case discussed with ED provider. Management plans discussed with the patient and/or family.  DVT PROPHYLAXIS: Systemic anticoagulation  GI PROPHYLAXIS: PPI and H2 blocker - home doses  ADMISSION STATUS: Inpatient  CODE STATUS: Full Code Status History    Date Active Date Inactive Code Status Order ID Comments User Context   08/31/2016 10:18 PM 09/03/2016  6:08 PM Full Code [id number]  Hubbard Robinson, MD ED  11/26/2015 12:02 AM 11/29/2015  9:00 PM Full Code [id number]  Hillary Bow, MD ED   07/04/2015  5:17 PM 07/10/2015  8:11 PM Full Code [id number]  Marlyce Huge, MD ED   03/25/2015  7:47 PM 03/29/2015  8:44 PM Full Code [id number]  Vaughan Basta, MD Inpatient      TOTAL TIME TAKING CARE OF THIS PATIENT: 45 minutes.    Clydette Privitera [place] 10/26/2016, 10:10 PM  [employer] [facility]  Office  [phone]  CC: Primary care physician; Glendon Axe, MD

## 2016-10-26 NOTE — ED Notes (Signed)
Patient transported to CT 

## 2016-10-27 ENCOUNTER — Encounter: Payer: Self-pay | Admitting: *Deleted

## 2016-10-27 DIAGNOSIS — J8410 Pulmonary fibrosis, unspecified: Secondary | ICD-10-CM | POA: Diagnosis not present

## 2016-10-27 DIAGNOSIS — R0602 Shortness of breath: Secondary | ICD-10-CM | POA: Diagnosis not present

## 2016-10-27 DIAGNOSIS — R0600 Dyspnea, unspecified: Secondary | ICD-10-CM | POA: Diagnosis present

## 2016-10-27 LAB — BASIC METABOLIC PANEL
ANION GAP: 6 (ref 5–15)
BUN: 13 mg/dL (ref 6–20)
CHLORIDE: 108 mmol/L (ref 101–111)
CO2: 24 mmol/L (ref 22–32)
Calcium: 9 mg/dL (ref 8.9–10.3)
Creatinine, Ser: 0.83 mg/dL (ref 0.44–1.00)
GFR calc non Af Amer: >60 mL/min (ref >60)
Glucose, Bld: 158 mg/dL — ABNORMAL HIGH (ref 65–99)
POTASSIUM: 4.2 mmol/L (ref 3.5–5.1)
Sodium: 138 mmol/L (ref 135–145)

## 2016-10-27 LAB — GLUCOSE, CAPILLARY
GLUCOSE-CAPILLARY: 159 mg/dL — AB (ref 65–99)
Glucose-Capillary: 226 mg/dL — ABNORMAL HIGH (ref 65–99)
Glucose-Capillary: 162 mg/dL — ABNORMAL HIGH (ref 65–99)

## 2016-10-27 LAB — CBC
HEMATOCRIT: 38.7 % (ref 35.0–47.0)
HEMOGLOBIN: 13.2 g/dL (ref 12.0–16.0)
MCH: 28.3 pg (ref 26.0–34.0)
MCHC: 34.2 g/dL (ref 32.0–36.0)
MCV: 82.9 fL (ref 80.0–100.0)
Platelets: 285 10*3/uL (ref 150–440)
RBC: 4.67 MIL/uL (ref 3.80–5.20)
RDW: 14.2 % (ref 11.5–14.5)
WBC: 8.7 10*3/uL (ref 3.6–11.0)

## 2016-10-27 MED ORDER — FAMOTIDINE 20 MG PO TABS
40.0000 mg | ORAL_TABLET | Freq: Every day | ORAL | Status: DC
Start: 2016-10-27 — End: 2016-10-27
  Administered 2016-10-27: 09:00:00 40 mg via ORAL
  Filled 2016-10-27: qty 2

## 2016-10-27 MED ORDER — ONDANSETRON HCL 4 MG/2ML IJ SOLN
4.0000 mg | Freq: Four times a day (QID) | INTRAMUSCULAR | Status: DC | PRN
  Administered 2016-10-27: 4 mg via INTRAVENOUS
  Filled 2016-10-27: qty 2

## 2016-10-27 MED ORDER — INSULIN ASPART 100 UNIT/ML ~~LOC~~ SOLN: 0.0000 [IU] | Freq: Every day | SUBCUTANEOUS | Status: DC

## 2016-10-27 MED ORDER — ACETAMINOPHEN 650 MG RE SUPP: 650.0000 mg | Freq: Four times a day (QID) | RECTAL | Status: DC | PRN

## 2016-10-27 MED ORDER — INSULIN ASPART 100 UNIT/ML ~~LOC~~ SOLN
0.0000 [IU] | Freq: Three times a day (TID) | SUBCUTANEOUS | Status: DC
Start: 2016-10-27 — End: 2016-10-27
  Administered 2016-10-27: 3 [IU] via SUBCUTANEOUS
  Administered 2016-10-27: 12:00:00 2 [IU] via SUBCUTANEOUS
  Filled 2016-10-27: qty 2
  Filled 2016-10-27: qty 3

## 2016-10-27 MED ORDER — AZITHROMYCIN 250 MG PO TABS: 250.0000 mg | ORAL_TABLET | Freq: Every day | ORAL | 0 refills | Status: DC

## 2016-10-27 MED ORDER — SODIUM CHLORIDE 0.9% FLUSH
3.0000 mL | Freq: Two times a day (BID) | INTRAVENOUS | Status: DC
  Administered 2016-10-27 (×3): 3 mL via INTRAVENOUS

## 2016-10-27 MED ORDER — PREDNISONE 5 MG PO TABS: ORAL_TABLET | ORAL | 0 refills | Status: DC

## 2016-10-27 MED ORDER — ACETAMINOPHEN 325 MG PO TABS
650.0000 mg | ORAL_TABLET | Freq: Four times a day (QID) | ORAL | Status: DC | PRN
  Administered 2016-10-27: 650 mg via ORAL
  Filled 2016-10-27: qty 2

## 2016-10-27 MED ORDER — SOTALOL HCL 80 MG PO TABS
80.0000 mg | ORAL_TABLET | Freq: Two times a day (BID) | ORAL | Status: DC
  Administered 2016-10-27 (×2): 80 mg via ORAL
  Filled 2016-10-27 (×3): qty 1

## 2016-10-27 MED ORDER — HYDRALAZINE HCL 20 MG/ML IJ SOLN: 10.0000 mg | INTRAMUSCULAR | Status: DC | PRN

## 2016-10-27 MED ORDER — ONDANSETRON HCL 4 MG PO TABS: 4.0000 mg | ORAL_TABLET | Freq: Four times a day (QID) | ORAL | Status: DC | PRN

## 2016-10-27 MED ORDER — PREDNISONE 50 MG PO TABS
50.0000 mg | ORAL_TABLET | Freq: Every day | ORAL | Status: DC
  Administered 2016-10-27: 15:00:00 50 mg via ORAL
  Filled 2016-10-27: qty 1

## 2016-10-27 MED ORDER — IPRATROPIUM-ALBUTEROL 0.5-2.5 (3) MG/3ML IN SOLN: 3.0000 mL | RESPIRATORY_TRACT | Status: DC | PRN

## 2016-10-27 MED ORDER — METOPROLOL SUCCINATE ER 25 MG PO TB24
50.0000 mg | ORAL_TABLET | Freq: Every day | ORAL | Status: DC
  Administered 2016-10-27: 50 mg via ORAL
  Filled 2016-10-27: qty 2

## 2016-10-27 MED ORDER — HYDRALAZINE HCL 50 MG PO TABS
25.0000 mg | ORAL_TABLET | Freq: Three times a day (TID) | ORAL | Status: DC
  Administered 2016-10-27: 09:00:00 25 mg via ORAL
  Filled 2016-10-27: qty 1

## 2016-10-27 MED ORDER — LEVOTHYROXINE SODIUM 50 MCG PO TABS
50.0000 ug | ORAL_TABLET | Freq: Every day | ORAL | Status: DC
  Administered 2016-10-27: 09:00:00 50 ug via ORAL
  Filled 2016-10-27: qty 1

## 2016-10-27 MED ORDER — LOSARTAN POTASSIUM 50 MG PO TABS
100.0000 mg | ORAL_TABLET | Freq: Every day | ORAL | Status: DC
  Administered 2016-10-27: 100 mg via ORAL
  Filled 2016-10-27: qty 2

## 2016-10-27 MED ORDER — FLUOXETINE HCL 10 MG PO CAPS
20.0000 mg | ORAL_CAPSULE | Freq: Every day | ORAL | Status: DC
  Administered 2016-10-27: 09:00:00 20 mg via ORAL
  Filled 2016-10-27: qty 2

## 2016-10-27 MED ORDER — PANTOPRAZOLE SODIUM 40 MG PO TBEC
40.0000 mg | DELAYED_RELEASE_TABLET | Freq: Every day | ORAL | Status: DC
  Administered 2016-10-27: 09:00:00 40 mg via ORAL
  Filled 2016-10-27: qty 1

## 2016-10-27 MED ORDER — APIXABAN 5 MG PO TABS
5.0000 mg | ORAL_TABLET | Freq: Two times a day (BID) | ORAL | Status: DC
  Administered 2016-10-27 (×2): 5 mg via ORAL
  Filled 2016-10-27 (×2): qty 1

## 2016-10-27 NOTE — Progress Notes (Signed)
Julie Blanco, MD notified that pt had an asymptomatic 5 beat run of V-tach. No new orders given at this time. Will continue to assess.

## 2016-10-27 NOTE — ED Notes (Signed)
Patient c/o nausea

## 2016-10-27 NOTE — Discharge Summary (Signed)
[facility] NAME: Julie Mcmillan    MR#:  [id number]  DATE OF BIRTH:  [birth year]  DATE OF ADMISSION:  10/26/2016   ADMITTING PHYSICIAN: Lance Coon, MD  DATE OF DISCHARGE:  10/27/2016 PRIMARY CARE PHYSICIAN: Singh,Jasmine, MD   ADMISSION DIAGNOSIS:  Pulmonary fibrosis ([place]) [J84.10] DOE (dyspnea on exertion) [R06.09] DISCHARGE DIAGNOSIS:  Principal Problem:   DOE (dyspnea on exertion) Active Problems:   Type 2 diabetes mellitus (HCC)   Paroxysmal atrial fibrillation (HCC)   Adult hypothyroidism   Acid reflux   Benign essential HTN   Interstitial lung disease ([place])  SECONDARY DIAGNOSIS:   Past Medical History:  Diagnosis Date  . A-fib ([place])   . Chronic diarrhea   . Diverticulitis   . GERD (gastroesophageal reflux disease)    usually takes protonix  . Hip fracture ([place]) 11/2015   hairline fracture on right.  no surgery, just physical therapy  . Hypertension   . IBS (irritable bowel syndrome)   . IBS (irritable bowel syndrome)   . Interstitial lung disease ([place])    does not remember when  . Seasonal affective disorder ([place])   . Shortness of breath dyspnea    with exertion  . Type 2 diabetes mellitus ([place]) 11/04/2014   HOSPITAL COURSE:   DOE (dyspnea on exertion) - due to poor pulmonary reserve given her interstitial lung disease. No definitive evidence of pneumonia or flu. No on O2. Prednisone 50 mg po daily for 7 days then taper 5 mg daily and zithromax for 6 days per Dr. Humphrey Rolls, pulmonology consult.  Active Problems:   Interstitial lung disease ([place]) - treatment as above   Type 2 diabetes mellitus (HCC) - sliding scale insulin with corresponding glucose checks   Paroxysmal atrial fibrillation ([place]) - continue home meds   Benign essential HTN - currently elevated here, however she did not take her home meds today. Restart home meds and will have additional when necessary antihypertensives in place for goal blood  pressure less than 160/100   Adult hypothyroidism - home dose thyroid replacement   Acid reflux - continue home meds I discussed with Dr. Humphrey Rolls.  DISCHARGE CONDITIONS:  Stable, discharge to home today. CONSULTS OBTAINED:  Treatment Team:  Allyne Gee, MD DRUG ALLERGIES:   Allergies  Allergen Reactions  . Amlodipine Swelling  . Nexium [Esomeprazole Magnesium] Diarrhea   DISCHARGE MEDICATIONS:   Allergies as of 10/27/2016      Reactions   Amlodipine Swelling   Nexium [esomeprazole Magnesium] Diarrhea      Medication List    TAKE these medications   azithromycin 250 MG tablet Commonly known as:  ZITHROMAX Z-PAK Take 1 tablet (250 mg total) by mouth daily.   CALCIUM 600+D 600-200 MG-UNIT Tabs Generic drug:  Calcium Carbonate-Vitamin D Take 1 tablet by mouth daily.   cholestyramine 4 GM/DOSE powder Commonly known as:  QUESTRAN Take 4 g by mouth every morning.   ELIQUIS 5 MG Tabs tablet Generic drug:  apixaban Take 5 mg by mouth 2 (two) times daily.   FLUoxetine 20 MG capsule Commonly known as:  PROZAC Take 20 mg by mouth daily.   hydrALAZINE 25 MG tablet Commonly known as:  APRESOLINE Take 25 mg by mouth 2 (two) times daily.   levothyroxine 50 MCG tablet Commonly known as:  SYNTHROID, LEVOTHROID Take 50 mcg by mouth daily before breakfast.   losartan 100 MG tablet Commonly known as:  COZAAR Take 100 mg by  mouth daily.   metoprolol succinate 50 MG 24 hr tablet Commonly known as:  TOPROL-XL Take 50 mg by mouth daily.   multivitamin with minerals Tabs tablet Take 1 tablet by mouth daily.   pantoprazole 40 MG tablet Commonly known as:  PROTONIX Take 1 tablet (40 mg total) by mouth daily.   predniSONE 5 MG tablet Commonly known as:  DELTASONE 50 mg po daily for 7 days, then decrease 5 mg each day until complete.   ranitidine 300 MG tablet Commonly known as:  ZANTAC Take 150 mg by mouth at bedtime.   sotalol 80 MG tablet Commonly known as:   BETAPACE Take 1 tablet (80 mg total) by mouth every 12 (twelve) hours.        DISCHARGE INSTRUCTIONS:  See AVS. If you experience worsening of your admission symptoms, develop shortness of breath, life threatening emergency, suicidal or homicidal thoughts you must seek medical attention immediately by calling 911 or calling your MD immediately  if symptoms less severe.  You Must read complete instructions/literature along with all the possible adverse reactions/side effects for all the Medicines you take and that have been prescribed to you. Take any new Medicines after you have completely understood and accpet all the possible adverse reactions/side effects.   Please note  You were cared for by a hospitalist during your hospital stay. If you have any questions about your discharge medications or the care you received while you were in the hospital after you are discharged, you can call the unit and asked to speak with the hospitalist on call if the hospitalist that took care of you is not available. Once you are discharged, your primary care physician will handle any further medical issues. Please note that NO REFILLS for any discharge medications will be authorized once you are discharged, as it is imperative that you return to your primary care physician (or establish a relationship with a primary care physician if you do not have one) for your aftercare needs so that they can reassess your need for medications and monitor your lab values.    On the day of Discharge:  VITAL SIGNS:  Blood pressure (!) 120/57, pulse 92, temperature 97.4 F (36.3 C), temperature source Oral, resp. rate 20, height _0  (1.626 m), weight 148 lb 3.2 oz (67.2 kg), SpO2 95 %. PHYSICAL EXAMINATION:  GENERAL:  76 y.o.-year-old patient lying in the bed with no acute distress.  EYES: Pupils equal, round, reactive to light and accommodation. No scleral icterus. Extraocular muscles intact.  HEENT: Head atraumatic,  normocephalic. Oropharynx and nasopharynx clear.  NECK:  Supple, no jugular venous distention. No thyroid enlargement, no tenderness.  LUNGS: Normal breath sounds bilaterally, no wheezing, rales,rhonchi, has mild crepitation. No use of accessory muscles of respiration.  CARDIOVASCULAR: S1, S2 normal. No murmurs, rubs, or gallops.  ABDOMEN: Soft, non-tender, non-distended. Bowel sounds present. No organomegaly or mass.  EXTREMITIES: No pedal edema, cyanosis, or clubbing.  NEUROLOGIC: Cranial nerves II through XII are intact. Muscle strength 5/5 in all extremities. Sensation intact. Gait not checked.  PSYCHIATRIC: The patient is alert and oriented x 3.  SKIN: No obvious rash, lesion, or ulcer.  DATA REVIEW:   CBC  Recent Labs Lab 10/27/16 0421  WBC 8.7  HGB 13.2  HCT 38.7  PLT 285    Chemistries   Recent Labs Lab 10/26/16 1650 10/27/16 0421  NA 139 138  K 4.2 4.2  CL 107 108  CO2 23 24  GLUCOSE 113* 158*  BUN 16 13  CREATININE 1.02* 0.83  CALCIUM 9.3 9.0  AST 37  --   ALT 24  --   ALKPHOS 76  --   BILITOT 1.2  --      Microbiology Results  Results for orders placed or performed during the hospital encounter of 08/31/16  Urine culture     Status: Abnormal   Collection Time: 08/31/16  5:59 PM  Result Value Ref Range Status   Specimen Description URINE, RANDOM  Final   Special Requests NONE  Final   Culture MULTIPLE SPECIES PRESENT, SUGGEST RECOLLECTION (A)  Final   Report Status 09/02/2016 FINAL  Final    RADIOLOGY:  Dg Chest 2 View  Result Date: 10/26/2016 CLINICAL DATA:  Shortness of breath x 2-3 weeks, history of pulmonary fibrosis EXAM: CHEST  2 VIEW COMPARISON:  08/31/2016 FINDINGS: Chronic fissure markings with subpleural reticulation/ fibrosis, compatible with chronic interstitial lung disease. No superimposed opacities suspicious for pneumonia. No pleural effusion or pneumothorax. The heart is normal in size. Moderate compression fracture deformity of a  mid thoracic vertebral body, chronic. IMPRESSION: Stable chronic interstitial lung disease. No evidence of acute cardiopulmonary disease. Electronically Signed   By: Julian Hy M.D.   On: 10/26/2016 17:21   Ct Angio Chest Pe W And/or Wo Contrast  Result Date: 10/26/2016 CLINICAL DATA:  Shortness of breath EXAM: CT ANGIOGRAPHY CHEST WITH CONTRAST TECHNIQUE: Multidetector CT imaging of the chest was performed using the standard protocol during bolus administration of intravenous contrast. Multiplanar CT image reconstructions and MIPs were obtained to evaluate the vascular anatomy. CONTRAST:  75 cc Isovue 370 COMPARISON:  Chest x-ray earlier same day.  Chest CT 07/08/2015 FINDINGS: Cardiovascular: Heart is enlarged. Trace pericardial fluid or thickening noted. Atherosclerotic calcification is noted in the wall of the thoracic aorta. Right main pulmonary artery upper normal diameter at 2.7 cm. No filling defects in the opacified pulmonary arteries to suggest the presence of an acute pulmonary embolus. Mediastinum/Nodes: 1.5 cm short axis precarinal lymph node was 1.6 cm previously. 1.5 cm short axis left hilar lymph node was 1.8 cm previously. 1.5 cm short axis right hilar lymph node was 2.0 cm previously. The esophagus has normal imaging features. There is no axillary lymphadenopathy. Lungs/Pleura: Underlying chronic interstitial lung disease again noted areas of subpleural honeycombing were slightly more prominent on prior imaging then current study. Scattered areas of mosaic attenuation likely related to small airway disease. Scattered bronchiectasis and airspace opacity is noted bilaterally. Upper Abdomen: Small to moderate hiatal hernia. Musculoskeletal: Bone windows reveal no worrisome lytic or sclerotic osseous lesions. Review of the MIP images confirms the above findings. IMPRESSION: 1. No CT evidence for acute pulmonary embolus. 2. Interval progression of interstitial lung disease. 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 3. Slight decrease in mediastinal and hilar lymphadenopathy, likely reactive. Electronically Signed   By: Misty Stanley M.D.   On: 10/26/2016 21:26     Management plans discussed with the patient, her son and they are in agreement.  CODE STATUS:     Code Status Orders        Start     Ordered   10/27/16 0040  Full code  Continuous     10/27/16 0039    Code Status History    Date Active Date Inactive Code Status Order ID Comments User Context   08/31/2016 10:18 PM 09/03/2016  6:08 PM Full Code [id number]  Hubbard Robinson, MD ED   11/26/2015 12:02 AM 11/29/2015  9:00 PM Full Code [id number]  Hillary Bow, MD ED   07/04/2015  5:17 PM 07/10/2015  8:11 PM Full Code [id number]  Marlyce Huge, MD ED   03/25/2015  7:47 PM 03/29/2015  8:44 PM Full Code [id number]  Vaughan Basta, MD Inpatient    Advance Directive Documentation   Flowsheet Row Most Recent Value  Type of Advance Directive  Healthcare Power of Attorney, Living will  Pre-existing out of facility DNR order (yellow form or pink MOST form)  No data  "MOST" Form in Place?  No data      TOTAL TIME TAKING CARE OF THIS PATIENT: 42 minutes.    Demetrios Loll M.D on 10/27/2016 at 2:24 PM  Between 7am to 6pm - Pager - [phone]  After 6pm go to [email or web address] - Proofreader  [facility]  Office  [phone]  CC: Primary care physician; Glendon Axe, MD   Note: This dictation was prepared with Dragon dictation along with smaller phrase technology. Any transcriptional errors that result from this process are unintentional.

## 2016-10-27 NOTE — ED Notes (Signed)
Pt transported to room 109

## 2016-10-27 NOTE — Progress Notes (Signed)
Pt admitted to RM 109, tele placed and verified, NSR on tele. No complaints of pain or discomfort, currently resting in room with alarm in place and call bell within reach.

## 2016-10-27 NOTE — Progress Notes (Signed)
Tylenol effective for HA. BP in normal limits no. No respiratory issues. Up in room and tolerated well. Oral and written AVS instructions given with questions answered to her satisfaction and repeated back. States she is agreeable with discharge home and has family support. Prescriptions given for prednisone and zithromax z pak.

## 2016-10-27 NOTE — Consult Note (Signed)
Pulmonary Critical Care  Initial Consult Note   ANALA WHISENANT YYT:[id number] DOB: [birth year] DOA: 10/26/2016  Referring physician: Demetrios Loll, MD PCP: Glendon Axe, MD   Chief Complaint: IPF  HPI: Julie Mcmillan is a 76 y.o. female diagnosed with IPF in 2000. She has been [facility] at [facility] for ILD. Patient has had a decline over the last year or so. She had been given Esbriet but it is not clear if she has been able to take the medications. Patient also had atrial fibrrilation with RVR and this is being better controlled now. Pulmonary did not feel immune suppression would be of help as she has been nonspecific with likely little inflammation. She now feels better today.   Review of Systems:  Constitutional:  No weight loss, night sweats, Fevers, chills, fatigue.  HEENT:  No headaches, nasal congestion, post nasal drip,  Cardio-vascular:  No chest pain, + palpitations  GI:  No heartburn, indigestion, abdominal pain, nausea, vomiting, diarrhea  Resp:  +shortness of breath. no productive cough, No coughing up of blood Skin:  no rash or lesions.  Musculoskeletal:  No joint pain or swelling.   Remainder ROS performed and is unremarkable other than noted in HPI  Past Medical History:  Diagnosis Date  . A-fib ([place])   . Chronic diarrhea   . Diverticulitis   . GERD (gastroesophageal reflux disease)    usually takes protonix  . Hip fracture ([place]) 11/2015   hairline fracture on right.  no surgery, just physical therapy  . Hypertension   . IBS (irritable bowel syndrome)   . IBS (irritable bowel syndrome)   . Interstitial lung disease ([place])    does not remember when  . Seasonal affective disorder ([place])   . Shortness of breath dyspnea    with exertion  . Type 2 diabetes mellitus ([place]) 11/04/2014   Past Surgical History:  Procedure Laterality Date  . ABDOMINAL HYSTERECTOMY     partial  . APPENDECTOMY    . CATARACT EXTRACTION W/PHACO Right 03/16/2015   Procedure: CATARACT  EXTRACTION PHACO AND INTRAOCULAR LENS PLACEMENT (IOC);  Surgeon: Leandrew Koyanagi, MD;  Location: [place];  Service: Ophthalmology;  Laterality: Right;  . CATARACT EXTRACTION W/PHACO Left 06/13/2016   Procedure: CATARACT EXTRACTION PHACO AND INTRAOCULAR LENS PLACEMENT (IOC);  Surgeon: Leandrew Koyanagi, MD;  Location: [place];  Service: Ophthalmology;  Laterality: Left;  pre-diabetic - diet controlled  . COLONOSCOPY    . COLONOSCOPY WITH PROPOFOL N/A 05/25/2016   Procedure: COLONOSCOPY WITH PROPOFOL;  Surgeon: Manya Silvas, MD;  Location[facility] ENDOSCOPY;  Service: Endoscopy;  Laterality: N/A;  . ESOPHAGOGASTRODUODENOSCOPY (EGD) WITH PROPOFOL N/A 05/25/2016   Procedure: ESOPHAGOGASTRODUODENOSCOPY (EGD) WITH PROPOFOL;  Surgeon: Manya Silvas, MD;  Location: [facility] ENDOSCOPY;  Service: Endoscopy;  Laterality: N/A;  . NISSEN FUNDOPLICATION  2751   came apart in 2016 and ibs and reflux returned  . OPEN REDUCTION INTERNAL FIXATION (ORIF) DISTAL RADIAL FRACTURE Right 01/13/2016   Procedure: OPEN REDUCTION INTERNAL FIXATION (ORIF) DISTAL RADIAL FRACTURE;  Surgeon: Hessie Knows, MD;  Location: [facility] ORS;  Service: Orthopedics;  Laterality: Right;  . TONSILLECTOMY     and adenoidectomy   Social History:  reports that she has never smoked. She has never used smokeless tobacco. She reports that she does not drink alcohol or use drugs.  Allergies  Allergen Reactions  . Amlodipine Swelling  . Nexium [Esomeprazole Magnesium] Diarrhea    Family History  Problem Relation Age of Onset  . Breast cancer  Mother   . Diabetes Mellitus II Mother   . Hypothyroidism Mother   . Atrial fibrillation Mother   . Heart attack Father     Prior to Admission medications   Medication Sig Start Date End Date Taking? Authorizing Provider  apixaban (ELIQUIS) 5 MG TABS tablet Take 5 mg by mouth 2 (two) times daily.   Yes Historical Provider, MD  Calcium Carbonate-Vitamin D (CALCIUM 600+D)  600-200 MG-UNIT TABS Take 1 tablet by mouth daily.   Yes Historical Provider, MD  cholestyramine Lucrezia Starch) 4 GM/DOSE powder Take 4 g by mouth every morning.    Yes Historical Provider, MD  FLUoxetine (PROZAC) 20 MG capsule Take 20 mg by mouth daily.   Yes Historical Provider, MD  hydrALAZINE (APRESOLINE) 25 MG tablet Take 25 mg by mouth 2 (two) times daily.    Yes Historical Provider, MD  levothyroxine (SYNTHROID, LEVOTHROID) 50 MCG tablet Take 50 mcg by mouth daily before breakfast.   Yes Historical Provider, MD  losartan (COZAAR) 100 MG tablet Take 100 mg by mouth daily.    Yes Historical Provider, MD  metoprolol succinate (TOPROL-XL) 50 MG 24 hr tablet Take 50 mg by mouth daily.   Yes Historical Provider, MD  Multiple Vitamin (MULTIVITAMIN WITH MINERALS) TABS tablet Take 1 tablet by mouth daily.   Yes Historical Provider, MD  pantoprazole (PROTONIX) 40 MG tablet Take 1 tablet (40 mg total) by mouth daily. 03/29/15  Yes Glendon Axe, MD  ranitidine (ZANTAC) 300 MG tablet Take 150 mg by mouth at bedtime.    Yes Historical Provider, MD  sotalol (BETAPACE) 80 MG tablet Take 1 tablet (80 mg total) by mouth every 12 (twelve) hours. 03/29/15  Yes Glendon Axe, MD   Physical Exam: Vitals:   10/27/16 0009 10/27/16 0040 10/27/16 0403 10/27/16 0914  BP: 138/90 (!) 131/57 (!) 121/49 (!) 120/57  Pulse: 85 89 78 92  Resp:  _0 Temp:  97.5 F (36.4 C) 97.4 F (36.3 C)   TempSrc:  Oral Oral   SpO2: 93% 93% 94% 95%  Weight:  67.2 kg (148 lb 3.2 oz)    Height:  _1  (1.626 m)      Wt Readings from Last 3 Encounters:  10/27/16 67.2 kg (148 lb 3.2 oz)  09/02/16 71.2 kg (157 lb)  06/13/16 66.7 kg (147 lb)    General:  Appears calm and comfortable Eyes: PERRL, normal lids, irises & conjunctiva ENT: grossly normal hearing, lips & tongue Neck: no LAD, masses or thyromegaly Cardiovascular: IRRR, no m/r/g. No LE edema. Respiratory: +rales Normal respiratory effort. Abdomen: soft,  nontender Skin: no rash or induration seen on limited exam Musculoskeletal: grossly normal tone BUE/BLE Psychiatric: grossly normal mood and affect Neurologic: grossly non-focal.          Labs on Admission:  Basic Metabolic Panel:  Recent Labs Lab 10/26/16 1650 10/27/16 0421  NA 139 138  K 4.2 4.2  CL 107 108  CO2 23 24  GLUCOSE 113* 158*  BUN 16 13  CREATININE 1.02* 0.83  CALCIUM 9.3 9.0   Liver Function Tests:  Recent Labs Lab 10/26/16 1650  AST 37  ALT 24  ALKPHOS 76  BILITOT 1.2  PROT 7.0  ALBUMIN 4.2   No results for input(s): LIPASE, AMYLASE in the last 168 hours. No results for input(s): AMMONIA in the last 168 hours. CBC:  Recent Labs Lab 10/26/16 1650 10/27/16 0421  WBC 8.6 8.7  NEUTROABS 5.7  --   HGB  13.5 13.2  HCT 41.3 38.7  MCV 83.9 82.9  PLT 303 285   Cardiac Enzymes:  Recent Labs Lab 10/26/16 1650  TROPONINI <0.03    BNP (last 3 results) No results for input(s): BNP in the last 8760 hours.  ProBNP (last 3 results) No results for input(s): PROBNP in the last 8760 hours.  CBG:  Recent Labs Lab 10/27/16 0044 10/27/16 0905 10/27/16 1125  GLUCAP 159* 226* 162*    Radiological Exams on Admission: Dg Chest 2 View  Result Date: 10/26/2016 CLINICAL DATA:  Shortness of breath x 2-3 weeks, history of pulmonary fibrosis EXAM: CHEST  2 VIEW COMPARISON:  08/31/2016 FINDINGS: Chronic fissure markings with subpleural reticulation/ fibrosis, compatible with chronic interstitial lung disease. No superimposed opacities suspicious for pneumonia. No pleural effusion or pneumothorax. The heart is normal in size. Moderate compression fracture deformity of a mid thoracic vertebral body, chronic. IMPRESSION: Stable chronic interstitial lung disease. No evidence of acute cardiopulmonary disease. Electronically Signed   By: Julian Hy M.D.   On: 10/26/2016 17:21   Ct Angio Chest Pe W And/or Wo Contrast  Result Date: 10/26/2016 CLINICAL  DATA:  Shortness of breath EXAM: CT ANGIOGRAPHY CHEST WITH CONTRAST TECHNIQUE: Multidetector CT imaging of the chest was performed using the standard protocol during bolus administration of intravenous contrast. Multiplanar CT image reconstructions and MIPs were obtained to evaluate the vascular anatomy. CONTRAST:  75 cc Isovue 370 COMPARISON:  Chest x-ray earlier same day.  Chest CT 07/08/2015 FINDINGS: Cardiovascular: Heart is enlarged. Trace pericardial fluid or thickening noted. Atherosclerotic calcification is noted in the wall of the thoracic aorta. Right main pulmonary artery upper normal diameter at 2.7 cm. No filling defects in the opacified pulmonary arteries to suggest the presence of an acute pulmonary embolus. Mediastinum/Nodes: 1.5 cm short axis precarinal lymph node was 1.6 cm previously. 1.5 cm short axis left hilar lymph node was 1.8 cm previously. 1.5 cm short axis right hilar lymph node was 2.0 cm previously. The esophagus has normal imaging features. There is no axillary lymphadenopathy. Lungs/Pleura: Underlying chronic interstitial lung disease again noted areas of subpleural honeycombing were slightly more prominent on prior imaging then current study. Scattered areas of mosaic attenuation likely related to small airway disease. Scattered bronchiectasis and airspace opacity is noted bilaterally. Upper Abdomen: Small to moderate hiatal hernia. Musculoskeletal: Bone windows reveal no worrisome lytic or sclerotic osseous lesions. Review of the MIP images confirms the above findings. IMPRESSION: 1. No CT evidence for acute pulmonary embolus. 2. Interval progression of interstitial lung disease. 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 3. Slight decrease in mediastinal and hilar lymphadenopathy, likely reactive. Electronically Signed   By: Misty Stanley M.D.   On: 10/26/2016 21:26    EKG: Independently reviewed.  Assessment/Plan Principal Problem:   DOE (dyspnea on  exertion) Active Problems:   Type 2 diabetes mellitus (HCC)   Paroxysmal atrial fibrillation (HCC)   Adult hypothyroidism   Acid reflux   Benign essential HTN   Interstitial lung disease ([place])   1. Pulmonary fibrosis -she has not been on her meds as noted -needs to restart esbriet as ordered by her primary pulmonologist at [facility] (may need to order non-formulary) -follow up for PFT and monitoring -would consider adding steroids for current acute situation with quick taper  2. Paroxysmal Atrial fibrillaion -control rate as indicated -f/u cardiology    Code Status: full code  Family Communication:  Disposition Plan: home  Time spent: 49mn    I have  personally obtained a history, examined the patient, evaluated laboratory and imaging results, formulated the assessment and plan and placed orders.  The Patient requires high complexity decision making for assessment and support.    Allyne Gee, MD [facility] Pulmonary Critical Care Medicine Sleep Medicine

## 2016-10-27 NOTE — Progress Notes (Signed)
Discharge home to self care accompanied by her son.

## 2016-10-28 LAB — HEMOGLOBIN A1C
HEMOGLOBIN A1C: 5.3 % (ref 4.8–5.6)
MEAN PLASMA GLUCOSE: 105 mg/dL

## 2016-10-30 ENCOUNTER — Telehealth: Payer: Self-pay

## 2016-10-30 NOTE — Telephone Encounter (Signed)
Spoke with patient at this time. She is scheduled to see Dr. Dahlia Byes on Thursday at 1045am. She has expressed that she does not want this surgery done laparoscopically from the information that she has received from physicians and surgeons this far.

## 2016-10-30 NOTE — Telephone Encounter (Signed)
Patient is calling because she wants her surgery done by Dr. Azalee Course. Please call patient and advice.

## 2016-11-01 ENCOUNTER — Ambulatory Visit (INDEPENDENT_AMBULATORY_CARE_PROVIDER_SITE_OTHER): Payer: Medicare HMO | Admitting: General Surgery

## 2016-11-01 ENCOUNTER — Telehealth: Payer: Self-pay

## 2016-11-01 ENCOUNTER — Encounter: Payer: Self-pay | Admitting: General Surgery

## 2016-11-01 VITALS — BP 167/91 | HR 72 | Temp 97.2°F | Ht 64.0 in | Wt 150.2 lb

## 2016-11-01 DIAGNOSIS — K5720 Diverticulitis of large intestine with perforation and abscess without bleeding: Secondary | ICD-10-CM

## 2016-11-01 NOTE — Patient Instructions (Addendum)
We will send Pulmonary and medical clearances to your doctors. We will plan to move forward with your surgery the third or fourth week of February. Please see your Blue sheet for surgery information.Please see your follow up appointment listed below. Please call our office if you have questions or concerns.

## 2016-11-01 NOTE — Telephone Encounter (Signed)
Medical Clearance faxed to Dr.Jasmine Singh at this time. Patient has appointment 11/08/16.  Pulmonary Clearance faxed to Dr. Juanda Bond at this time. Patient has an appointment 11/05/16.

## 2016-11-01 NOTE — Progress Notes (Signed)
Outpatient Surgical Follow Up  11/01/2016  Julie Mcmillan is an 76 y.o. female.   Chief Complaint  Patient presents with  . New Patient (Initial Visit)    Ed Follow up-Diverticular Abscess-08/31/16-09/03/16    HPI: 76 year old female this clinic for follow-up for diverticular abscess. Since her last visit with [place] she was admitted due to exacerbation of her pulmonary status. She reports that her abdominal pain remains completely resolved but she continues to feel "wiped out". She is currently on steroids for her pulmonary fibrosis. She denies any fevers, chills, nausea, vomiting, chest pain, diarrhea, constipation. She has a chronic shortness of breath. She is still on antibiotics for her diverticular abscess but states that she should be off of it in 2 days.  Past Medical History:  Diagnosis Date  . A-fib ([place])   . Chronic diarrhea   . Diverticulitis   . GERD (gastroesophageal reflux disease)    usually takes protonix  . Hip fracture ([place]) 11/2015   hairline fracture on right.  no surgery, just physical therapy  . Hypertension   . IBS (irritable bowel syndrome)   . IBS (irritable bowel syndrome)   . Interstitial lung disease ([place])    does not remember when  . Seasonal affective disorder ([place])   . Shortness of breath dyspnea    with exertion  . Type 2 diabetes mellitus ([place]) 11/04/2014    Past Surgical History:  Procedure Laterality Date  . ABDOMINAL HYSTERECTOMY     partial  . APPENDECTOMY    . CATARACT EXTRACTION W/PHACO Right 03/16/2015   Procedure: CATARACT EXTRACTION PHACO AND INTRAOCULAR LENS PLACEMENT (IOC);  Surgeon: Leandrew Koyanagi, MD;  Location: [place];  Service: Ophthalmology;  Laterality: Right;  . CATARACT EXTRACTION W/PHACO Left 06/13/2016   Procedure: CATARACT EXTRACTION PHACO AND INTRAOCULAR LENS PLACEMENT (IOC);  Surgeon: Leandrew Koyanagi, MD;  Location: [place];  Service: Ophthalmology;  Laterality: Left;  pre-diabetic - diet  controlled  . COLONOSCOPY    . COLONOSCOPY WITH PROPOFOL N/A 05/25/2016   Procedure: COLONOSCOPY WITH PROPOFOL;  Surgeon: Manya Silvas, MD;  Location: [facility] ENDOSCOPY;  Service: Endoscopy;  Laterality: N/A;  . ESOPHAGOGASTRODUODENOSCOPY (EGD) WITH PROPOFOL N/A 05/25/2016   Procedure: ESOPHAGOGASTRODUODENOSCOPY (EGD) WITH PROPOFOL;  Surgeon: Manya Silvas, MD;  Location: [facility] ENDOSCOPY;  Service: Endoscopy;  Laterality: N/A;  . NISSEN FUNDOPLICATION  6415   came apart in 2016 and ibs and reflux returned  . OPEN REDUCTION INTERNAL FIXATION (ORIF) DISTAL RADIAL FRACTURE Right 01/13/2016   Procedure: OPEN REDUCTION INTERNAL FIXATION (ORIF) DISTAL RADIAL FRACTURE;  Surgeon: Hessie Knows, MD;  Location: [facility] ORS;  Service: Orthopedics;  Laterality: Right;  . TONSILLECTOMY     and adenoidectomy    Family History  Problem Relation Age of Onset  . Breast cancer Mother   . Diabetes Mellitus II Mother   . Hypothyroidism Mother   . Atrial fibrillation Mother   . Heart attack Father   . Diabetes Maternal Grandmother   . Heart attack Maternal Grandfather     Social History:  reports that she has never smoked. She has never used smokeless tobacco. She reports that she does not drink alcohol or use drugs.  Allergies:  Allergies  Allergen Reactions  . Amlodipine Swelling  . Nexium [Esomeprazole Magnesium] Diarrhea  . Aspartame Other (See Comments)    Patient reports "self diagnosed intolerance" to artificial sweeteners.    Medications reviewed.    ROS A multipoint review of systems was completed. All pertinent positives  and negatives are documented within the history of present illness the remainder are negative.   BP (!) 167/91   Pulse 72   Temp 97.2 F (36.2 C) (Oral)   Ht _0  (1.626 m)   Wt 68.1 kg (150 lb 3.2 oz)   BMI 25.78 kg/m   Physical Exam Gen.: No acute distress Neck: Supple and nontender Chest: Coarse breath sounds throughout, equal wall motion Heart: Regular  rhythm Abdomen: Soft, nontender, nondistended. Well healed lower midline incisions. Extremity is: Moves all extremities well.    No results found for this or any previous visit (from the past 48 hour(s)). No results found.  Assessment/Plan:  1. Diverticulitis of large intestine with abscess without bleeding 76 year old female with recent diverticulitis with abscess. Has recovered well on antibiotics. Had long conversation about the surgical indications for diverticulitis. From a diverticular standpoint she meets criteria for an elective sigmoid colectomy. Discussed that we would like to attempt to do this laparoscopically but given her pulmonary disease she may not be a candidate. Also discussed that if she is required to be on steroids from pulmonary status that an anastomosis without a proximal diverting ostomy would be at risk. She is to complete her antibiotics, complete her current course of steroids, follow up with pulmonology and internal medicine. She will need clearance from both of those providers prior to proceeding with surgery. Tentative plan would be for surgery in 3-4 weeks. She will follow-up in clinic in 2 weeks to discuss results from her other providers and to go over the surgical plan.  A total 25 minutes was uses this encounter with greater than 50% of it used for counseling and coordination of care.   Clayburn Pert, MD FACS General Surgeon  11/01/2016,1:11 PM

## 2016-11-02 ENCOUNTER — Telehealth: Payer: Self-pay

## 2016-11-02 NOTE — Telephone Encounter (Signed)
Pulmonary Clearance faxed at this time to [facility].

## 2016-11-06 ENCOUNTER — Telehealth: Payer: Self-pay

## 2016-11-06 ENCOUNTER — Inpatient Hospital Stay: Payer: Medicare Other | Admitting: Surgery

## 2016-11-06 NOTE — Telephone Encounter (Signed)
Medical Clearance received at this time, however cannot be provided at this time due to patient needing further testing, Pulmonary Function test and follow up with provider and to be weaned down on some medications. Please see office notes from 11/05/16 Dr.Saadat Humphrey Rolls.  Left message for patient to call office. Appointment 11/15/16 @ 9:30 with Dr.Woodham needs to be rescheduled at a later date due to further testing Pulmonology has scheduled.

## 2016-11-15 ENCOUNTER — Ambulatory Visit: Payer: Medicare HMO | Admitting: General Surgery

## 2016-11-19 ENCOUNTER — Telehealth: Payer: Self-pay

## 2016-11-19 NOTE — Telephone Encounter (Signed)
Medical Clearance obtained at this time from [facility] and will be scanned under Media.

## 2016-12-03 ENCOUNTER — Telehealth: Payer: Self-pay

## 2016-12-03 NOTE — Telephone Encounter (Signed)
Medical Clearance obtained at this time from [facility] at this time and will be scanned under Media.

## 2016-12-04 NOTE — Telephone Encounter (Signed)
Medical Clearance obtained from DR.Clayborn Bigness at this time and will be scanned under Media.

## 2016-12-05 ENCOUNTER — Ambulatory Visit: Payer: Medicare HMO | Admitting: General Surgery

## 2016-12-05 ENCOUNTER — Telehealth: Payer: Self-pay

## 2016-12-05 NOTE — Telephone Encounter (Signed)
Please reschedule patient to be seen by Dr. Adonis Huguenin to discuss surgery and decide on surgery date.   Patient was a NO SHOW for her appointment today.

## 2016-12-06 NOTE — Telephone Encounter (Signed)
Called patient and left a voicemail for her to call back to reschedule her appointment

## 2016-12-12 ENCOUNTER — Telehealth: Payer: Self-pay

## 2016-12-12 NOTE — Telephone Encounter (Signed)
Called patient to schedule her an appointment with Dr. Adonis Huguenin. She was a no show to her last appointment on 12/05/16. Her godson picked up the phone and said she was sleeping. I was not able to make an appointment for her through him because he is not on her FYI. He said he would tell her to call me as soon as she wakes up.

## 2017-02-21 ENCOUNTER — Emergency Department: Payer: Medicare HMO

## 2017-02-21 ENCOUNTER — Encounter: Payer: Self-pay | Admitting: Emergency Medicine

## 2017-02-21 ENCOUNTER — Emergency Department
Admission: EM | Admit: 2017-02-21 | Discharge: 2017-02-22 | Disposition: A | Discharge status: Other Acute Inpatient Hospital | Payer: Medicare HMO | Attending: Emergency Medicine | Admitting: Emergency Medicine

## 2017-02-21 DIAGNOSIS — R0600 Dyspnea, unspecified: Secondary | ICD-10-CM | POA: Insufficient documentation

## 2017-02-21 DIAGNOSIS — E119 Type 2 diabetes mellitus without complications: Secondary | ICD-10-CM | POA: Diagnosis not present

## 2017-02-21 DIAGNOSIS — I10 Essential (primary) hypertension: Secondary | ICD-10-CM | POA: Insufficient documentation

## 2017-02-21 DIAGNOSIS — R531 Weakness: Secondary | ICD-10-CM | POA: Diagnosis present

## 2017-02-21 DIAGNOSIS — R0789 Other chest pain: Secondary | ICD-10-CM | POA: Diagnosis not present

## 2017-02-21 DIAGNOSIS — Z7901 Long term (current) use of anticoagulants: Secondary | ICD-10-CM | POA: Insufficient documentation

## 2017-02-21 DIAGNOSIS — E039 Hypothyroidism, unspecified: Secondary | ICD-10-CM | POA: Insufficient documentation

## 2017-02-21 DIAGNOSIS — Z79899 Other long term (current) drug therapy: Secondary | ICD-10-CM | POA: Insufficient documentation

## 2017-02-21 HISTORY — DX: Pulmonary fibrosis, unspecified: J84.10

## 2017-02-21 HISTORY — DX: Pulmonary hypertension, unspecified: I27.20

## 2017-02-21 LAB — COMPREHENSIVE METABOLIC PANEL
ALBUMIN: 3.6 g/dL (ref 3.5–5.0)
ALT: 33 U/L (ref 14–54)
ANION GAP: 7 (ref 5–15)
AST: 56 U/L — ABNORMAL HIGH (ref 15–41)
Alkaline Phosphatase: 62 U/L (ref 38–126)
BUN: 20 mg/dL (ref 6–20)
CHLORIDE: 108 mmol/L (ref 101–111)
CO2: 24 mmol/L (ref 22–32)
Calcium: 8.5 mg/dL — ABNORMAL LOW (ref 8.9–10.3)
Creatinine, Ser: 1.43 mg/dL — ABNORMAL HIGH (ref 0.44–1.00)
GFR calc non Af Amer: 34 mL/min — ABNORMAL LOW (ref >60)
GFR, EST AFRICAN AMERICAN: 40 mL/min — AB (ref >60)
Glucose, Bld: 143 mg/dL — ABNORMAL HIGH (ref 65–99)
Potassium: 4 mmol/L (ref 3.5–5.1)
SODIUM: 139 mmol/L (ref 135–145)
Total Bilirubin: 0.6 mg/dL (ref 0.3–1.2)
Total Protein: 5.8 g/dL — ABNORMAL LOW (ref 6.5–8.1)

## 2017-02-21 LAB — URINALYSIS, COMPLETE (UACMP) WITH MICROSCOPIC
BACTERIA UA: NONE SEEN
Bilirubin Urine: NEGATIVE
GLUCOSE, UA: NEGATIVE mg/dL
HGB URINE DIPSTICK: NEGATIVE
Ketones, ur: NEGATIVE mg/dL
NITRITE: NEGATIVE
PROTEIN: NEGATIVE mg/dL
Specific Gravity, Urine: 1.024 (ref 1.005–1.030)
pH: 5 (ref 5.0–8.0)

## 2017-02-21 LAB — CBC WITH DIFFERENTIAL/PLATELET
Basophils Absolute: 0.1 10*3/uL (ref 0–0.1)
Basophils Relative: 1 %
Eosinophils Absolute: 0.4 10*3/uL (ref 0–0.7)
Eosinophils Relative: 3 %
HEMATOCRIT: 40.7 % (ref 35.0–47.0)
HEMOGLOBIN: 13.7 g/dL (ref 12.0–16.0)
LYMPHS ABS: 1.2 10*3/uL (ref 1.0–3.6)
LYMPHS PCT: 9 %
MCH: 30.1 pg (ref 26.0–34.0)
MCHC: 33.8 g/dL (ref 32.0–36.0)
MCV: 89.2 fL (ref 80.0–100.0)
Monocytes Absolute: 1.6 10*3/uL — ABNORMAL HIGH (ref 0.2–0.9)
Monocytes Relative: 12 %
Neutro Abs: 10.7 10*3/uL — ABNORMAL HIGH (ref 1.4–6.5)
Neutrophils Relative %: 75 %
Platelets: 277 10*3/uL (ref 150–440)
RBC: 4.56 MIL/uL (ref 3.80–5.20)
RDW: 14.8 % — ABNORMAL HIGH (ref 11.5–14.5)
WBC: 14.1 10*3/uL — AB (ref 3.6–11.0)

## 2017-02-21 LAB — TROPONIN I
Troponin I: <0.03 ng/mL (ref <0.03)
Troponin I: <0.03 ng/mL (ref <0.03)

## 2017-02-21 LAB — GLUCOSE, CAPILLARY: GLUCOSE-CAPILLARY: 126 mg/dL — AB (ref 65–99)

## 2017-02-21 MED ORDER — NITROGLYCERIN 2 % TD OINT
TOPICAL_OINTMENT | TRANSDERMAL | Status: AC
  Administered 2017-02-21: 0.5 [in_us] via TOPICAL
  Filled 2017-02-21: qty 1

## 2017-02-21 MED ORDER — PROMETHAZINE HCL 25 MG/ML IJ SOLN
INTRAMUSCULAR | Status: AC
Start: 2017-02-21 — End: 2017-02-21
  Administered 2017-02-21: 12.5 mg via INTRAVENOUS
  Filled 2017-02-21: qty 1

## 2017-02-21 MED ORDER — PROMETHAZINE HCL 25 MG/ML IJ SOLN
12.5000 mg | Freq: Once | INTRAMUSCULAR | Status: AC
  Administered 2017-02-21: 12.5 mg via INTRAVENOUS

## 2017-02-21 MED ORDER — NITROGLYCERIN 2 % TD OINT
0.5000 [in_us] | TOPICAL_OINTMENT | Freq: Once | TRANSDERMAL | Status: AC
  Administered 2017-02-21: 0.5 [in_us] via TOPICAL

## 2017-02-21 MED ORDER — SODIUM CHLORIDE 0.9 % IV SOLN
Freq: Once | INTRAVENOUS | Status: AC
Start: 2017-02-21 — End: 2017-02-21
  Administered 2017-02-21: 13:00:00 via INTRAVENOUS

## 2017-02-21 NOTE — ED Notes (Signed)
EDP at bedside updating patient.

## 2017-02-21 NOTE — ED Notes (Signed)
Chaplain paged to bring information on advance directives.

## 2017-02-21 NOTE — ED Notes (Signed)
Patient's son, [name], to pick up patient if patient discharged from ER: [phone]. Patient would like to be transferred to [facility] if admission needed d/t study she is in there for pulmonary fibrosis/pulmonary HTN.

## 2017-02-21 NOTE — ED Triage Notes (Addendum)
Patient to ER for c/o nausea, dizziness, profuse diaphoresis, and mild chest tightness to left/mid chest upon waking this am. Patient took 2 Zofran tablets at home. Upon EMS arrival, EMS states patient was drenched in sweat. Patient states she has recently been diagnosed with pulmonary HTN and has been on steroids and antibiotics. Patient is unsure of why she is on antibiotics. Patient also reports tingling to right arm since waking.  Patient had orthostatic BP's done with EMS with no change in BP/HR with positions.

## 2017-02-21 NOTE — ED Provider Notes (Signed)
[facility] Emergency Department Provider Note   ____________________________________________   First MD Initiated Contact with Patient 02/21/17 [phone]     (approximate)  I have reviewed the triage vital signs and the nursing notes.   HISTORY  Chief Complaint Dizziness  Chief complaint is weakness and nausea  HPI Julie Mcmillan is a 76 y.o. female with history of atrial fibrillation who takes L course and has pulmonary hypertension and pulmonary fibrosis is been on steroids complains of waking up this morning very nauseated and sweating. She also has some chest heaviness. Chest heaviness is like a 3 out of 10 and she says. She was very weak and unable to stand this morning. Patient is not running a fever has no other complaints at present. She is too weak to see if the chest tightness is worse with exertion.  Past Medical History:  Diagnosis Date  . A-fib ([place])   . Chronic diarrhea   . Diverticulitis   . GERD (gastroesophageal reflux disease)    usually takes protonix  . Hip fracture ([place]) 11/2015   hairline fracture on right.  no surgery, just physical therapy  . Hypertension   . IBS (irritable bowel syndrome)   . IBS (irritable bowel syndrome)   . Interstitial lung disease ([place])    does not remember when  . Pulmonary fibrosis ([place])   . Pulmonary hypertension ([place])   . Seasonal affective disorder ([place])   . Shortness of breath dyspnea    with exertion  . Type 2 diabetes mellitus ([place]) 11/04/2014    Patient Active Problem List   Diagnosis Date Noted  . Dyspnea 10/27/2016  . Diverticulitis of large intestine with abscess without bleeding 08/31/2016  . Interstitial lung disease ([place]) 08/31/2016  . Diverticulitis of large intestine with abscess 08/31/2016  . Depression, major, single episode, complete remission ([place]) 02/23/2016  . DOE (dyspnea on exertion) 01/26/2016  . Acute bronchitis 11/29/2015  . Acute respiratory failure with hypoxia ([place])  11/29/2015  . Fracture of greater trochanter of right femur ([place]) 11/29/2015  . Fall 11/26/2015  . OP (osteoporosis) 07/18/2015  . 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 Adult hypothyroidism 07/18/2015  . BP (high blood pressure) 07/18/2015  . Fatigue 07/18/2015  . Anxiety 07/18/2015  . Diverticulitis large intestine 07/04/2015  . Paroxysmal atrial fibrillation ([place]) 04/22/2015  . Clinical depression 04/22/2015  . Atrial fibrillation with rapid ventricular response ([place]) 03/25/2015  . Atrial fibrillation ([place]) 03/25/2015  . Acquired hypothyroidism 01/11/2015  . Anemia, iron deficiency 01/10/2015  . Benign essential HTN 01/10/2015  . Type 2 diabetes mellitus ([place]) 11/04/2014  . 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 Essential (primary) hypertension 11/04/2014  . Temporary cerebral vascular dysfunction 04/08/2014  . Dry mouth 01/05/2013  . Cannot sleep 01/05/2013  . Obstructive apnea 09/02/2012  . Bursitis, ischial 05/19/2012  . Acid reflux 02/01/2012  . DD (diverticular disease) 08/14/2011    Past Surgical History:  Procedure Laterality Date  . ABDOMINAL HYSTERECTOMY     partial  . APPENDECTOMY    . CATARACT EXTRACTION W/PHACO Right 03/16/2015   Procedure: CATARACT EXTRACTION PHACO AND INTRAOCULAR LENS PLACEMENT (IOC);  Surgeon: Leandrew Koyanagi, MD;  Location: [place];  Service: Ophthalmology;  Laterality: Right;  . CATARACT EXTRACTION W/PHACO Left 06/13/2016   Procedure: CATARACT EXTRACTION PHACO AND INTRAOCULAR LENS PLACEMENT (IOC);  Surgeon: Leandrew Koyanagi, MD;  Location: [place];  Service: Ophthalmology;  Laterality: Left;  pre-diabetic - diet  controlled  . COLONOSCOPY    . COLONOSCOPY WITH PROPOFOL N/A 05/25/2016   Procedure: COLONOSCOPY WITH PROPOFOL;  Surgeon: Manya Silvas, MD;  Location: [facility] ENDOSCOPY;  Service: Endoscopy;  Laterality: N/A;  . ESOPHAGOGASTRODUODENOSCOPY (EGD) WITH  PROPOFOL N/A 05/25/2016   Procedure: ESOPHAGOGASTRODUODENOSCOPY (EGD) WITH PROPOFOL;  Surgeon: Manya Silvas, MD;  Location: [facility] ENDOSCOPY;  Service: Endoscopy;  Laterality: N/A;  . NISSEN FUNDOPLICATION  0981   came apart in 2016 and ibs and reflux returned  . OPEN REDUCTION INTERNAL FIXATION (ORIF) DISTAL RADIAL FRACTURE Right 01/13/2016   Procedure: OPEN REDUCTION INTERNAL FIXATION (ORIF) DISTAL RADIAL FRACTURE;  Surgeon: Hessie Knows, MD;  Location: [facility] ORS;  Service: Orthopedics;  Laterality: Right;  . TONSILLECTOMY     and adenoidectomy    Prior to Admission medications   Medication Sig Start Date End Date Taking? Authorizing Provider  apixaban (ELIQUIS) 5 MG TABS tablet Take 5 mg by mouth 2 (two) times daily.   Yes [provider]  Calcium Carbonate-Vitamin D (CALCIUM 600+D) 600-200 MG-UNIT TABS Take 1 tablet by mouth daily.   Yes [provider]  cholestyramine Lucrezia Starch) 4 GM/DOSE powder Take 4 g by mouth every morning.    Yes [provider]  FLUoxetine (PROZAC) 20 MG capsule Take 20 mg by mouth daily.   Yes [provider]  hydrALAZINE (APRESOLINE) 25 MG tablet Take 25 mg by mouth 3 (three) times daily.    Yes [provider]  levofloxacin (LEVAQUIN) 750 MG tablet Take 750 mg by mouth daily. 02/12/17 02/24/17 Yes [provider]  levothyroxine (SYNTHROID, LEVOTHROID) 50 MCG tablet Take 50 mcg by mouth daily before breakfast.   Yes [provider]  losartan (COZAAR) 100 MG tablet Take 100 mg by mouth daily.    Yes [provider]  metoprolol succinate (TOPROL-XL) 50 MG 24 hr tablet Take 50 mg by mouth daily.   Yes [provider]  pantoprazole (PROTONIX) 40 MG tablet Take 1 tablet (40 mg total) by mouth daily. 03/29/15  Yes Glendon Axe, MD  predniSONE (DELTASONE) 10 MG tablet Take 10 mg by mouth daily. 01/13/17  Yes [provider]  ranitidine (ZANTAC) 300 MG tablet Take 300 mg by mouth at  bedtime.    Yes [provider]  sotalol (BETAPACE) 80 MG tablet Take 1 tablet (80 mg total) by mouth every 12 (twelve) hours. 03/29/15  Yes Glendon Axe, MD  azithromycin (ZITHROMAX Z-PAK) 250 MG tablet Take 1 tablet (250 mg total) by mouth daily. Patient not taking: Reported on 02/21/2017 10/27/16   Demetrios Loll, MD  predniSONE (DELTASONE) 5 MG tablet 50 mg po daily for 7 days, then decrease 5 mg each day until complete. Patient not taking: Reported on 02/21/2017 10/27/16   Demetrios Loll, MD    Allergies Amlodipine; Nexium [esomeprazole magnesium]; and Aspartame  Family History  Problem Relation Age of Onset  . Breast cancer Mother   . Diabetes Mellitus II Mother   . Hypothyroidism Mother   . Atrial fibrillation Mother   . Heart attack Father   . Diabetes Maternal Grandmother   . Heart attack Maternal Grandfather     Social History Social History  Substance Use Topics  . Smoking status: Never Smoker  . Smokeless tobacco: Never Used     Comment: no passive smokers in home  . Alcohol use No    Review of Systems  Constitutional: No fever/chills Eyes: No visual changes. ENT: No sore throat. Cardiovascular: See history of present illness  Respiratory: Denies shortness of breath. Gastrointestinal: No abdominal pain.  No nausea, no vomiting.  No diarrhea.  No constipation. Genitourinary: Negative for dysuria. Musculoskeletal: Negative for back pain. Skin: Negative for rash. Neurological: Negative for headaches, focal weakness or numbness.  ____________________________________________   PHYSICAL EXAM:  VITAL SIGNS: ED Triage Vitals  Enc Vitals Group     BP      Pulse      Resp      Temp      Temp src      SpO2      Weight      Height      Head Circumference      Peak Flow      Pain Score      Pain Loc      Pain Edu?      Excl. in [place]?    Constitutional: Alert and oriented. Well appearing and in no acute distress. Eyes: Conjunctivae are normal. PERRL.  EOMI. Head: Atraumatic. Nose: No congestion/rhinnorhea. Mouth/Throat: Mucous membranes are moist.  Oropharynx non-erythematous. Neck: No stridor.   Cardiovascular: Normal rate, regular rhythm. Grossly normal heart sounds.  Good peripheral circulation. Respiratory: Normal respiratory effort.  No retractions. Lungs CTAB. Gastrointestinal: Soft and nontender. No distention. No abdominal bruits. No CVA tenderness. Musculoskeletal: No lower extremity tenderness nor edema.  No joint effusions. Neurologic:  Normal speech and language. No gross focal neurologic deficits are appreciated.  Skin:  Skin is warm, dry and intact. No rash noted. Psychiatric: Mood and affect are normal. Speech and behavior are normal.  ____________________________________________   LABS (all labs ordered are listed, but only abnormal results are displayed)  Labs Reviewed  COMPREHENSIVE METABOLIC PANEL - Abnormal; Notable for the following:       Result Value   Glucose, Bld 143 (*)    Creatinine, Ser 1.43 (*)    Calcium 8.5 (*)    Total Protein 5.8 (*)    AST 56 (*)    GFR calc non Af Amer 34 (*)    GFR calc Af Amer 40 (*)    All other components within normal limits  CBC WITH DIFFERENTIAL/PLATELET - Abnormal; Notable for the following:    WBC 14.1 (*)    RDW 14.8 (*)    Neutro Abs 10.7 (*)    Monocytes Absolute 1.6 (*)    All other components within normal limits  GLUCOSE, CAPILLARY - Abnormal; Notable for the following:    Glucose-Capillary 126 (*)    All other components within normal limits  URINALYSIS, COMPLETE (UACMP) WITH MICROSCOPIC - Abnormal; Notable for the following:    Color, Urine YELLOW (*)    APPearance CLEAR (*)    Leukocytes, UA SMALL (*)    Squamous Epithelial / LPF 0-5 (*)    All other components within normal limits  TROPONIN I  TROPONIN I   ____________________________________________  EKG  EKG read and interpreted by me shows normal sinus rhythm rate of 82 normal slight right  axis flipped T's in II, III, and F and some ST T wave slight depression and flattening in V4 5 and 6 this is all been present in the last 2 EKGs dating back to the end of 2017. ____________________________________________  RADIOLOGY  Study Result   CLINICAL DATA:  Nausea.  Dizziness.  Diaphoresis.  Chest tightness.  EXAM: PORTABLE CHEST 1 VIEW  COMPARISON:  CT 10/26/2016. Chest x-ray 10/26/2016, 11/25/2015, 09/27/ 2016.  FINDINGS: Mediastinum and hilar structures normal. Stable cardiomegaly. Interstitial prominence again  noted consistent chronic interstitial lung disease. No pleural effusion or pneumothorax. No acute bony abnormality.  IMPRESSION: 1. Cardiomegaly.  2. Interstitial prominence again noted consistent chronic interstitial lung disease. No acute pulmonary abnormality identified.   Electronically Signed   By: Marcello Moores  Register   On: 02/21/2017 08:03    ____________________________________________   PROCEDURES  Procedure(s) performed:   Procedures  Critical Care performed:   ____________________________________________   INITIAL IMPRESSION / ASSESSMENT AND PLAN / ED COURSE  Pertinent labs & imaging results that were available during my care of the patient were reviewed by me and considered in my medical decision making (see chart for details).  Troponins are negative lab work is unrevealing the patient is on steroids which could easily explain her white count being elevated. However she still feels very weak. She reports she is having increasing shortness of breath with exertion and walking. She gets very sweaty when she walks. I would plan on observing the patient overnight and possibly doing more a ca   rdiac workup of the patient really wants to go to [facility] as is where she gets her pulmonary hypertension reported fibrosis treated. I will try to get [facility] and see if they have any beds and Except her in transfer.      at 340 were still waiting for  [facility] to call back. We've called them a couple times. No locking even arranging urine ER transport at this point. ____________________________________________   FINAL CLINICAL IMPRESSION(S) / ED DIAGNOSES  Final diagnoses:  Dyspnea, unspecified type  Chest discomfort      NEW MEDICATIONS STARTED DURING THIS VISIT:  New Prescriptions   No medications on file     Note:  This document was prepared using Dragon voice recognition software and may include unintentional dictation errors.    Nena Polio, MD 02/21/17 1539

## 2017-02-21 NOTE — ED Notes (Signed)
Report given to Stephen, RN 

## 2017-02-21 NOTE — ED Notes (Signed)
Patient called out c/o nausea. Patient given medication as ordered.

## 2017-02-22 NOTE — ED Notes (Addendum)
Pt transferred to [facility]; report called into Sh[facility]on Leyden and RN on the 7th floor. [facility] is transporting the patient. Pt's belongings packed up in bag and at bedside to be sent with patient. Dr. Kerman Passey has re-assessed the patient.

## 2017-02-22 NOTE — ED Provider Notes (Signed)
-----------------------------------------   1:00 AM on 02/22/2017 -----------------------------------------  Patient is getting ready for transport to hospital. I've seen and evaluated the patient. She denies any discomfort at this time. She appears well and states she is readyto be transported.    Harvest Dark, MD 02/22/17 0100

## 2017-08-11 ENCOUNTER — Inpatient Hospital Stay
Admission: EM | Admit: 2017-08-11 | Discharge: 2017-08-16 | DRG: 392 | Disposition: A | Discharge status: Home / Self Care | Payer: Medicare HMO | Attending: Internal Medicine | Admitting: Internal Medicine

## 2017-08-11 ENCOUNTER — Encounter: Payer: Self-pay | Admitting: Emergency Medicine

## 2017-08-11 ENCOUNTER — Other Ambulatory Visit: Payer: Self-pay

## 2017-08-11 DIAGNOSIS — R110 Nausea: Secondary | ICD-10-CM

## 2017-08-11 DIAGNOSIS — F419 Anxiety disorder, unspecified: Secondary | ICD-10-CM | POA: Diagnosis present

## 2017-08-11 DIAGNOSIS — K862 Cyst of pancreas: Secondary | ICD-10-CM | POA: Diagnosis present

## 2017-08-11 DIAGNOSIS — I10 Essential (primary) hypertension: Secondary | ICD-10-CM | POA: Diagnosis present

## 2017-08-11 DIAGNOSIS — I482 Chronic atrial fibrillation: Secondary | ICD-10-CM | POA: Diagnosis present

## 2017-08-11 DIAGNOSIS — K5720 Diverticulitis of large intestine with perforation and abscess without bleeding: Secondary | ICD-10-CM

## 2017-08-11 DIAGNOSIS — Z7901 Long term (current) use of anticoagulants: Secondary | ICD-10-CM

## 2017-08-11 DIAGNOSIS — I272 Pulmonary hypertension, unspecified: Secondary | ICD-10-CM | POA: Diagnosis present

## 2017-08-11 DIAGNOSIS — Z8249 Family history of ischemic heart disease and other diseases of the circulatory system: Secondary | ICD-10-CM

## 2017-08-11 DIAGNOSIS — K219 Gastro-esophageal reflux disease without esophagitis: Secondary | ICD-10-CM | POA: Diagnosis present

## 2017-08-11 DIAGNOSIS — E119 Type 2 diabetes mellitus without complications: Secondary | ICD-10-CM | POA: Diagnosis present

## 2017-08-11 DIAGNOSIS — Z7952 Long term (current) use of systemic steroids: Secondary | ICD-10-CM

## 2017-08-11 DIAGNOSIS — Z9981 Dependence on supplemental oxygen: Secondary | ICD-10-CM

## 2017-08-11 DIAGNOSIS — Z833 Family history of diabetes mellitus: Secondary | ICD-10-CM

## 2017-08-11 DIAGNOSIS — R1030 Lower abdominal pain, unspecified: Secondary | ICD-10-CM

## 2017-08-11 DIAGNOSIS — D72829 Elevated white blood cell count, unspecified: Secondary | ICD-10-CM | POA: Diagnosis present

## 2017-08-11 DIAGNOSIS — J8410 Pulmonary fibrosis, unspecified: Secondary | ICD-10-CM | POA: Diagnosis present

## 2017-08-11 DIAGNOSIS — R197 Diarrhea, unspecified: Secondary | ICD-10-CM

## 2017-08-11 DIAGNOSIS — Z90710 Acquired absence of both cervix and uterus: Secondary | ICD-10-CM

## 2017-08-11 DIAGNOSIS — Z79899 Other long term (current) drug therapy: Secondary | ICD-10-CM

## 2017-08-11 DIAGNOSIS — E039 Hypothyroidism, unspecified: Secondary | ICD-10-CM | POA: Diagnosis present

## 2017-08-11 DIAGNOSIS — K589 Irritable bowel syndrome without diarrhea: Secondary | ICD-10-CM | POA: Diagnosis present

## 2017-08-11 DIAGNOSIS — M810 Age-related osteoporosis without current pathological fracture: Secondary | ICD-10-CM | POA: Diagnosis present

## 2017-08-11 DIAGNOSIS — K5792 Diverticulitis of intestine, part unspecified, without perforation or abscess without bleeding: Secondary | ICD-10-CM | POA: Diagnosis present

## 2017-08-11 LAB — CBC
HCT: 44.6 % (ref 35.0–47.0)
Hemoglobin: 14.5 g/dL (ref 12.0–16.0)
MCH: 28.2 pg (ref 26.0–34.0)
MCHC: 32.5 g/dL (ref 32.0–36.0)
MCV: 86.8 fL (ref 80.0–100.0)
PLATELETS: 318 10*3/uL (ref 150–440)
RBC: 5.14 MIL/uL (ref 3.80–5.20)
RDW: 13.7 % (ref 11.5–14.5)
WBC: 14.8 10*3/uL — AB (ref 3.6–11.0)

## 2017-08-11 LAB — COMPREHENSIVE METABOLIC PANEL
ALBUMIN: 3.9 g/dL (ref 3.5–5.0)
ALT: 13 U/L — ABNORMAL LOW (ref 14–54)
ANION GAP: 8 (ref 5–15)
AST: 21 U/L (ref 15–41)
Alkaline Phosphatase: 71 U/L (ref 38–126)
BILIRUBIN TOTAL: 0.7 mg/dL (ref 0.3–1.2)
BUN: 20 mg/dL (ref 6–20)
CO2: 20 mmol/L — ABNORMAL LOW (ref 22–32)
Calcium: 9.1 mg/dL (ref 8.9–10.3)
Chloride: 110 mmol/L (ref 101–111)
Creatinine, Ser: 0.99 mg/dL (ref 0.44–1.00)
GFR calc Af Amer: >60 mL/min (ref >60)
GFR calc non Af Amer: 54 mL/min — ABNORMAL LOW (ref >60)
GLUCOSE: 111 mg/dL — AB (ref 65–99)
POTASSIUM: 4.2 mmol/L (ref 3.5–5.1)
SODIUM: 138 mmol/L (ref 135–145)
TOTAL PROTEIN: 6.6 g/dL (ref 6.5–8.1)

## 2017-08-11 LAB — LIPASE, BLOOD: LIPASE: 31 U/L (ref 11–51)

## 2017-08-11 MED ORDER — ONDANSETRON 4 MG PO TBDP
4.0000 mg | ORAL_TABLET | Freq: Once | ORAL | Status: AC | PRN
  Administered 2017-08-11: 4 mg via ORAL
  Filled 2017-08-11: qty 1

## 2017-08-11 MED ORDER — FENTANYL CITRATE (PF) 100 MCG/2ML IJ SOLN
50.0000 ug | INTRAMUSCULAR | Status: DC | PRN
  Administered 2017-08-11: 50 ug via NASAL
  Filled 2017-08-11: qty 2

## 2017-08-11 MED ORDER — SODIUM CHLORIDE 0.9 % IV BOLUS (SEPSIS)
500.0000 mL | INTRAVENOUS | Status: AC
  Administered 2017-08-12: 500 mL via INTRAVENOUS

## 2017-08-11 MED ORDER — IOPAMIDOL (ISOVUE-300) INJECTION 61%
30.0000 mL | Freq: Once | INTRAVENOUS | Status: AC | PRN
  Administered 2017-08-11: 30 mL via ORAL

## 2017-08-11 NOTE — ED Provider Notes (Signed)
[facility] Emergency Department Provider Note  ____________________________________________   First MD Initiated Contact with Patient 08/11/17 2330     (approximate)  I have reviewed the triage vital signs and the nursing notes.   HISTORY  Chief Complaint Diarrhea    HPI Julie Mcmillan is a 76 y.o. female with extensive past medical history who is primarily treated at [facility] for her pulmonary fibrosis and pulmonary hypertension.  She presents tonight by EMS for evaluation of persistent diarrhea over the course of the day in the setting of acute on chronic lower abdominal pain.  She reports that she does not always have abdominal pain but it is also not uncommon for her.  However the pain today is been severe and significantly worse than usual.  She has a history of multiple episodes of diverticulitis and states that this feels similar.  She was last treated about 5 or 6 months ago as an outpatient for presumed diverticulitis that was not confirmed with any imaging but she did improve on Augmentin.  She reports that she has a history of IBS but that the diarrhea has been profuse today with multiple episodes, sometimes where she could almost not make it to the bathroom.  While she was having bowel movements she felt weak and lightheaded and felt like she was going to pass out.  Nothing in particular makes her symptoms better or worse.  She waited approximately 4-1/2 hours in the waiting room before coming to an exam room and she reports that she did have one small volume bowel movement while she was vomiting.  She does not feel lightheaded now but she feels generally weak.  She has not had much to eat or drink today and thinks she would benefit from some fluids.  She denies nausea, vomiting, chest pain, shortness of breath, fever/chills.  The abdominal pain waxes and wanes in intensity and is mostly present in the right lower quadrant although it seems to radiate from the  left.  She describes it as both a sharp and aching pain.  She denies dysuria.  Past Medical History:  Diagnosis Date  . A-fib ([place])   . Chronic diarrhea   . Diverticulitis   . GERD (gastroesophageal reflux disease)    usually takes protonix  . Hip fracture ([place]) 11/2015   hairline fracture on right.  no surgery, just physical therapy  . Hypertension   . IBS (irritable bowel syndrome)   . IBS (irritable bowel syndrome)   . Interstitial lung disease ([place])    does not remember when  . Pulmonary fibrosis ([place])   . Pulmonary hypertension ([place])   . Seasonal affective disorder ([place])   . Shortness of breath dyspnea    with exertion  . Type 2 diabetes mellitus ([place]) 11/04/2014    Patient Active Problem List   Diagnosis Date Noted  . Dyspnea 10/27/2016  . Diverticulitis of large intestine with abscess without bleeding 08/31/2016  . Interstitial lung disease ([place]) 08/31/2016  . Diverticulitis of large intestine with abscess 08/31/2016  . Depression, major, single episode, complete remission ([place]) 02/23/2016  . DOE (dyspnea on exertion) 01/26/2016  . Acute bronchitis 11/29/2015  . Acute respiratory failure with hypoxia ([place]) 11/29/2015  . Fracture of greater trochanter of right femur ([place]) 11/29/2015  . Fall 11/26/2015  . OP (osteoporosis) 07/18/2015  . 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 Adult hypothyroidism 07/18/2015  . BP (high blood pressure) 07/18/2015  . Fatigue 07/18/2015  .  Anxiety 07/18/2015  . Diverticulitis large intestine 07/04/2015  . Paroxysmal atrial fibrillation ([place]) 04/22/2015  . Clinical depression 04/22/2015  . Atrial fibrillation with rapid ventricular response ([place]) 03/25/2015  . Atrial fibrillation ([place]) 03/25/2015  . Acquired hypothyroidism 01/11/2015  . Anemia, iron deficiency 01/10/2015  . Benign essential HTN 01/10/2015  . Type 2 diabetes mellitus ([place]) 11/04/2014  . 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 Essential (primary) hypertension 11/04/2014  . Temporary cerebral vascular dysfunction 04/08/2014  . Dry mouth 01/05/2013  . Cannot sleep 01/05/2013  . Obstructive apnea 09/02/2012  . Bursitis, ischial 05/19/2012  . Acid reflux 02/01/2012  . DD (diverticular disease) 08/14/2011    Past Surgical History:  Procedure Laterality Date  . ABDOMINAL HYSTERECTOMY     partial  . APPENDECTOMY    . COLONOSCOPY    . NISSEN FUNDOPLICATION  7673   came apart in 2016 and ibs and reflux returned  . TONSILLECTOMY     and adenoidectomy    Prior to Admission medications   Medication Sig Start Date End Date Taking? Authorizing Provider  apixaban (ELIQUIS) 5 MG TABS tablet Take 5 mg by mouth 2 (two) times daily.    [provider]  azithromycin (ZITHROMAX Z-PAK) 250 MG tablet Take 1 tablet (250 mg total) by mouth daily. Patient not taking: Reported on 02/21/2017 10/27/16   Demetrios Loll, MD  Calcium Carbonate-Vitamin D (CALCIUM 600+D) 600-200 MG-UNIT TABS Take 1 tablet by mouth daily.    [provider]  cholestyramine Lucrezia Starch) 4 GM/DOSE powder Take 4 g by mouth every morning.     [provider]  FLUoxetine (PROZAC) 20 MG capsule Take 20 mg by mouth daily.    [provider]  hydrALAZINE (APRESOLINE) 25 MG tablet Take 25 mg by mouth 3 (three) times daily.     [provider]  levothyroxine (SYNTHROID, LEVOTHROID) 50 MCG tablet Take 50 mcg by mouth daily before breakfast.    [provider]  losartan (COZAAR) 100 MG tablet Take 100 mg by mouth daily.     [provider]  metoprolol succinate (TOPROL-XL) 50 MG 24 hr tablet Take 50 mg by mouth daily.    [provider]  pantoprazole (PROTONIX) 40 MG tablet Take 1 tablet (40 mg total) by mouth daily. 03/29/15   Glendon Axe, MD  predniSONE (DELTASONE) 10 MG tablet Take 10 mg by mouth daily. 01/13/17   [provider]  predniSONE (DELTASONE) 5 MG tablet 50 mg po  daily for 7 days, then decrease 5 mg each day until complete. Patient not taking: Reported on 02/21/2017 10/27/16   Demetrios Loll, MD  ranitidine (ZANTAC) 300 MG tablet Take 300 mg by mouth at bedtime.     [provider]  sotalol (BETAPACE) 80 MG tablet Take 1 tablet (80 mg total) by mouth every 12 (twelve) hours. 03/29/15   Glendon Axe, MD    Allergies Amlodipine; Nexium [esomeprazole magnesium]; and Aspartame  Family History  Problem Relation Age of Onset  . Breast cancer Mother   . Diabetes Mellitus II Mother   . Hypothyroidism Mother   . Atrial fibrillation Mother   . Heart attack Father   . Diabetes Maternal Grandmother   . Heart attack Maternal Grandfather     Social History Social History   Tobacco Use  . Smoking status: Never Smoker  . Smokeless tobacco: Never Used  . Tobacco comment: no passive smokers in home  Substance Use Topics  . Alcohol use: No  Alcohol/week: 0.0 oz  . Drug use: No    Review of Systems Constitutional: No fever/chills.  Generalized weakness and lightheadedness. Eyes: No visual changes. ENT: No sore throat. Cardiovascular: Denies chest pain. Respiratory: Denies shortness of breath. Gastrointestinal: Worsening abdominal pain over the last 24 hours that is present in both lower quadrants but worse on the right.  Multiple episodes of diarrhea with worsening bowel urgency.  No nausea or vomiting but decreased appetite. Genitourinary: Negative for dysuria. Musculoskeletal: Negative for neck pain.  Negative for back pain. Integumentary: Negative for rash. Neurological: Negative for headaches, focal weakness or numbness.     ____________________________________________   PHYSICAL EXAM:  VITAL SIGNS: ED Triage Vitals  Enc Vitals Group     BP 08/11/17 1940 131/73     Pulse Rate 08/11/17 1940 91     Resp --      Temp 08/11/17 1940 98.5 F (36.9 C)     Temp Source 08/11/17 1940 Oral     SpO2 08/11/17 1940 96 %     Weight  08/11/17 1941 65.8 kg (145 lb)     Height 08/11/17 1941 1.626 m (5' 4")     Head Circumference --      Peak Flow --      Pain Score --      Pain Loc --      Pain Edu? --      Excl. in [place]? --     Constitutional: Alert and oriented. Well appearing and in no acute distress. Eyes: Conjunctivae are normal.  Head: Atraumatic. Nose: No congestion/rhinnorhea. Mouth/Throat: Mucous membranes are moist. Neck: No stridor.  No meningeal signs.   Cardiovascular: Normal rate, regular rhythm. Good peripheral circulation. Grossly normal heart sounds. Respiratory: Normal respiratory effort.  No retractions. Lungs CTAB. Gastrointestinal: Soft with diffuse tenderness throughout the abdomen but moderate tenderness to palpation of the right lower quadrant mild to moderate tenderness of the left lower quadrant.  No rebound or guarding.  Negative Murphy sign. Musculoskeletal: No lower extremity tenderness nor edema. No gross deformities of extremities. Neurologic:  Normal speech and language. No gross focal neurologic deficits are appreciated.  Skin:  Skin is warm, dry and intact. No rash noted. Psychiatric: Mood and affect are normal. Speech and behavior are normal.  ____________________________________________   LABS (all labs ordered are listed, but only abnormal results are displayed)  Labs Reviewed  COMPREHENSIVE METABOLIC PANEL - Abnormal; Notable for the following components:      Result Value   CO2 20 (*)    Glucose, Bld 111 (*)    ALT 13 (*)    GFR calc non Af Amer 54 (*)    All other components within normal limits  CBC - Abnormal; Notable for the following components:   WBC 14.8 (*)    All other components within normal limits  LIPASE, BLOOD  URINALYSIS, COMPLETE (UACMP) WITH MICROSCOPIC   ____________________________________________  EKG  None - EKG not ordered by ED physician ____________________________________________  RADIOLOGY   Ct Abdomen Pelvis W Contrast  Result  Date: 08/12/2017 CLINICAL DATA:  76 year old female with abdominal pain. Concern for diverticulitis. EXAM: CT ABDOMEN AND PELVIS WITH CONTRAST TECHNIQUE: Multidetector CT imaging of the abdomen and pelvis was performed using the standard protocol following bolus administration of intravenous contrast. CONTRAST:  110m ISOVUE-300 IOPAMIDOL (ISOVUE-300) INJECTION 61% COMPARISON:  Abdominal MRI dated 09/14/2016 and CT dated 08/31/2016 and 11/25/2015. FINDINGS: Lower chest: There is fibrosis and subpleural honeycombing reflecting interstitial lung disease. No focal consolidation  in the visualized lung bases. There is coronary vascular calcification. There is dilatation of the main pulmonary trunk indicative of underlying pulmonary hypertension. No intra-abdominal free air or free fluid. Hepatobiliary: The liver is unremarkable. There is postsurgical changes of cholecystectomy. There is mild dilatation of the common bile duct measuring up to 12 mm, likely post cholecystectomy changes. Pancreas: There is a 16 mm cystic lesion at the head of the pancreas as seen on the prior CT and MRI. Spleen: Normal in size without focal abnormality. Adrenals/Urinary Tract: The adrenal glands are unremarkable. Mild bilateral renal cortical scarring primarily involving the upper poles, and left more prominent than right. There is no hydronephrosis on either side. There is uniform excretion and enhancement of both kidneys. The visualized ureters and urinary bladder appear unremarkable. Stomach/Bowel: There is extensive sigmoid diverticulosis with muscular hypertrophy. Thickened appearance of the wall of the sigmoid colon, likely chronic changes, however superimposed acute diverticulitis is not excluded. There is a 2.0 x 2.5 x 2.6 cm pocket/collection containing air and fluid anterior to the sigmoid colon and superior to the bladder as seen on the prior CT. This most likely represents a focally contained diverticular perforation or  abscess. There is loss of fat plane between this collection and bladder wall. Although no air is noted within the urinary bladder, an early rectovesical fistula is not excluded. There is a moderate size hiatal hernia. There is abutment of multiple loops of small bowel in the left upper abdomen (series 2, image 52 and coronal series 5, image 27) compatible with adhesions. No bowel obstruction. The appendix is not visualized with certainty. No inflammatory changes identified in the right lower quadrant. Vascular/Lymphatic: Mild aortoiliac atherosclerotic disease. The abdominal aorta and IVC are otherwise unremarkable. No portal venous gas. There is no adenopathy. Reproductive: Hysterectomy. Other: None Musculoskeletal: Osteopenia. There is T8 compression fracture as seen on the prior CT. There is near complete loss of vertebral body height centrally. Mild buckling of the posterior cortex without retropulsion. No acute fracture. Avascular necrosis of the right femoral head. IMPRESSION: 1. Extensive sigmoid diverticulosis with chronic changes and muscular hypertrophy. An acute on chronic diverticulitis is not excluded. Correlation with clinical exam recommended. 2. A small pocket/collection containing air and fluid anterior to the sigmoid and superior to the bladder likely represents a diverticular abscess or focally contained perforation. There is loss of fat plane between this collection and the bladder wall. No air is seen within the bladder. This however can predispose to a rectovesical fistula. 3. A 16 mm cystic lesion in the head of the pancreas as seen on the prior CT and MRI. 4. Fibrotic changes of the lung bases with findings of interstitial lung disease. Electronically Signed   By: Anner Crete M.D.   On: 08/12/2017 02:13     ____________________________________________   PROCEDURES  Critical Care performed: No   Procedure(s) performed:    Procedures   ____________________________________________   INITIAL IMPRESSION / ASSESSMENT AND PLAN / ED COURSE  As part of my medical decision making, I reviewed the following data within the [place] notes reviewed and incorporated, Labs reviewed , Old chart reviewed, Discussed with admitting physician (Dr. Estanislado Pandy), A consult was requested and obtained from this/these consultant(s) Surgery and Notes from prior ED visits    Differential diagnosis includes, but is not limited to, ovarian cyst, ovarian torsion, acute appendicitis, diverticulitis, urinary tract infection/pyelonephritis, endometriosis, bowel obstruction, colitis, renal colic, gastroenteritis, hernia, fibroids, endometriosis, pregnancy related pain including ectopic  pregnancy, etc. however the patient reports that she has had an appendectomy and a cholecystectomy.  She reportedly has had multiple episodes of diverticulitis in the past.  Her presentation is not clinically consistent with bowel obstruction although it is possible especially given her extensive abdominal surgical history.  I think the diverticulitis is the most likely culprit of an acute or emergent cause of her symptoms although viral illness versus bacterial colitis versus acute on chronic IVS is also possible.  Given her lightheadedness and generalized weak p.o. intake I will give her a small fluid bolus and we will obtain a CT scan of her abdomen and pelvis to rule out acute intra-abdominal pathology.  She agrees with the plan.  If she is able to provide a stool sample, we will check stool studies although I think c. diff is particularly unlikely.    Clinical Course as of Aug 12 408  Mon Aug 12, 2017  0228 Chronic diverticulosis, one area of possible superimposed acute diverticulitis, but also an area of abscess/fluid collection that is approximately 2 cm x 2.5 cm x 2.6 cm anterior to the sigmoid colon that likely represents a  perforated diverticulitis.  I called and spoke by phone with the surgeon, Dr. Peyton Najjar, and we discussed the case.  He will come to the emergency department to evaluate the patient in person. CT Abdomen Pelvis W Contrast [CF]  1427 Dr. Peyton Najjar in to see the patient  [CF]  0347 Dr. Peyton Najjar evaluated the patient and we discussed the case.  Given that the patient has not eaten or had anything to drink in nearly 24 hours and is having diarrhea with what appears to be a (stable) diverticular abscess on CT scan, he recommends medical admission with IV antibiotics, hydration, and surgery will follow.  She is not a surgical candidate as confirmed by her pulmonologist at [facility], so surgery will follow and offer recommendations but will not admit primarily.I discussed antibiotics with the patient and she states that she does not believe she is ever had a reaction to Flagyl as was documented in a prior PCP note.  If the C. difficile test is negative, I will start with ceftriaxone and Flagyl and discussed the case with the hospitalist.  [CF]  0353 C Diff interpretation: No C. difficile detected. [CF]  0355 Negative GI panel.  Will proceed with ceftriaxone+metronidazole  [CF]  0405 Spoke by phone with Dr. Estanislado Pandy with the hospitalist service.  Discussed case in detail including the rationale behind the antibiotics chosen.  He will admit.  [CF]    Clinical Course User Index [CF] Hinda Kehr, MD    ____________________________________________  FINAL CLINICAL IMPRESSION(S) / ED DIAGNOSES  Final diagnoses:  Colonic diverticular abscess  Diarrhea, unspecified type  Nausea  Lower abdominal pain  Acute diverticulitis     ED Discharge Orders    None       Note:  This document was prepared using Dragon voice recognition software and may include unintentional dictation errors.    Hinda Kehr, MD 08/12/17 [phone]

## 2017-08-11 NOTE — ED Triage Notes (Signed)
Per EMS report, patient is coming from c/o diarrhea for one day and generalized lower abdominal pain for approximately one month. VS enroute were 158/91, 97% RA, 79bpm pulse, 98.4 oral.

## 2017-08-11 NOTE — ED Triage Notes (Signed)
Pt arrives via [facility] from home with c/o diarrhea x 1 day. Pt believes that she will transferred to [facility] because that is where her care is located. Pt informed that MD will evaluate her and make a decision at the time. Pt is in NAD at this time.

## 2017-08-12 ENCOUNTER — Emergency Department: Payer: Medicare HMO

## 2017-08-12 ENCOUNTER — Other Ambulatory Visit: Payer: Self-pay

## 2017-08-12 ENCOUNTER — Encounter: Payer: Self-pay | Admitting: Radiology

## 2017-08-12 DIAGNOSIS — I272 Pulmonary hypertension, unspecified: Secondary | ICD-10-CM | POA: Diagnosis present

## 2017-08-12 DIAGNOSIS — K862 Cyst of pancreas: Secondary | ICD-10-CM | POA: Diagnosis present

## 2017-08-12 DIAGNOSIS — K5720 Diverticulitis of large intestine with perforation and abscess without bleeding: Secondary | ICD-10-CM

## 2017-08-12 DIAGNOSIS — D72829 Elevated white blood cell count, unspecified: Secondary | ICD-10-CM | POA: Diagnosis present

## 2017-08-12 DIAGNOSIS — Z833 Family history of diabetes mellitus: Secondary | ICD-10-CM | POA: Diagnosis not present

## 2017-08-12 DIAGNOSIS — Z7952 Long term (current) use of systemic steroids: Secondary | ICD-10-CM | POA: Diagnosis not present

## 2017-08-12 DIAGNOSIS — I10 Essential (primary) hypertension: Secondary | ICD-10-CM | POA: Diagnosis present

## 2017-08-12 DIAGNOSIS — K5792 Diverticulitis of intestine, part unspecified, without perforation or abscess without bleeding: Secondary | ICD-10-CM | POA: Diagnosis present

## 2017-08-12 DIAGNOSIS — I482 Chronic atrial fibrillation: Secondary | ICD-10-CM | POA: Diagnosis present

## 2017-08-12 DIAGNOSIS — Z8249 Family history of ischemic heart disease and other diseases of the circulatory system: Secondary | ICD-10-CM | POA: Diagnosis not present

## 2017-08-12 DIAGNOSIS — Z7901 Long term (current) use of anticoagulants: Secondary | ICD-10-CM | POA: Diagnosis not present

## 2017-08-12 DIAGNOSIS — Z9981 Dependence on supplemental oxygen: Secondary | ICD-10-CM | POA: Diagnosis not present

## 2017-08-12 DIAGNOSIS — E119 Type 2 diabetes mellitus without complications: Secondary | ICD-10-CM | POA: Diagnosis present

## 2017-08-12 DIAGNOSIS — J8410 Pulmonary fibrosis, unspecified: Secondary | ICD-10-CM | POA: Diagnosis present

## 2017-08-12 DIAGNOSIS — Z79899 Other long term (current) drug therapy: Secondary | ICD-10-CM | POA: Diagnosis not present

## 2017-08-12 DIAGNOSIS — K589 Irritable bowel syndrome without diarrhea: Secondary | ICD-10-CM | POA: Diagnosis present

## 2017-08-12 DIAGNOSIS — Z90710 Acquired absence of both cervix and uterus: Secondary | ICD-10-CM | POA: Diagnosis not present

## 2017-08-12 DIAGNOSIS — M810 Age-related osteoporosis without current pathological fracture: Secondary | ICD-10-CM | POA: Diagnosis present

## 2017-08-12 DIAGNOSIS — K219 Gastro-esophageal reflux disease without esophagitis: Secondary | ICD-10-CM | POA: Diagnosis present

## 2017-08-12 DIAGNOSIS — F419 Anxiety disorder, unspecified: Secondary | ICD-10-CM | POA: Diagnosis present

## 2017-08-12 DIAGNOSIS — E039 Hypothyroidism, unspecified: Secondary | ICD-10-CM | POA: Diagnosis present

## 2017-08-12 DIAGNOSIS — R110 Nausea: Secondary | ICD-10-CM | POA: Diagnosis present

## 2017-08-12 LAB — GASTROINTESTINAL PANEL BY PCR, STOOL (REPLACES STOOL CULTURE)

## 2017-08-12 LAB — BASIC METABOLIC PANEL
ANION GAP: 8 (ref 5–15)
BUN: 18 mg/dL (ref 6–20)
CALCIUM: 8.3 mg/dL — AB (ref 8.9–10.3)
CO2: 25 mmol/L (ref 22–32)
Chloride: 105 mmol/L (ref 101–111)
Creatinine, Ser: 0.88 mg/dL (ref 0.44–1.00)
Glucose, Bld: 103 mg/dL — ABNORMAL HIGH (ref 65–99)
Potassium: 4.2 mmol/L (ref 3.5–5.1)
Sodium: 138 mmol/L (ref 135–145)

## 2017-08-12 LAB — CBC
HCT: 41 % (ref 35.0–47.0)
HEMOGLOBIN: 13.6 g/dL (ref 12.0–16.0)
MCH: 28.9 pg (ref 26.0–34.0)
MCHC: 33.2 g/dL (ref 32.0–36.0)
MCV: 87.2 fL (ref 80.0–100.0)
Platelets: 275 10*3/uL (ref 150–440)
RBC: 4.7 MIL/uL (ref 3.80–5.20)
RDW: 13.7 % (ref 11.5–14.5)
WBC: 11.1 10*3/uL — ABNORMAL HIGH (ref 3.6–11.0)

## 2017-08-12 LAB — C DIFFICILE QUICK SCREEN W PCR REFLEX
C DIFFICILE (CDIFF) TOXIN: NEGATIVE
C Diff antigen: NEGATIVE
C Diff interpretation: NOT DETECTED

## 2017-08-12 MED ORDER — ACETAMINOPHEN 325 MG PO TABS: 650.0000 mg | ORAL_TABLET | Freq: Four times a day (QID) | ORAL | Status: DC | PRN

## 2017-08-12 MED ORDER — PREDNISONE 10 MG PO TABS
10.0000 mg | ORAL_TABLET | Freq: Every day | ORAL | Status: DC
  Filled 2017-08-12: qty 1

## 2017-08-12 MED ORDER — CIPROFLOXACIN IN D5W 400 MG/200ML IV SOLN
400.0000 mg | Freq: Two times a day (BID) | INTRAVENOUS | Status: DC
  Administered 2017-08-12 – 2017-08-13 (×2): 400 mg via INTRAVENOUS
  Filled 2017-08-12 (×3): qty 200

## 2017-08-12 MED ORDER — LOSARTAN POTASSIUM 50 MG PO TABS
100.0000 mg | ORAL_TABLET | Freq: Every day | ORAL | Status: DC
  Administered 2017-08-12 – 2017-08-16 (×4): 100 mg via ORAL
  Filled 2017-08-12 (×6): qty 2

## 2017-08-12 MED ORDER — DEXTROSE 5 % IV SOLN
2.0000 g | Freq: Once | INTRAVENOUS | Status: AC
  Administered 2017-08-12: 2 g via INTRAVENOUS
  Filled 2017-08-12: qty 2

## 2017-08-12 MED ORDER — OXYCODONE HCL 5 MG PO TABS
5.0000 mg | ORAL_TABLET | Freq: Four times a day (QID) | ORAL | Status: DC | PRN
  Administered 2017-08-12 – 2017-08-14 (×4): 5 mg via ORAL
  Filled 2017-08-12 (×4): qty 1

## 2017-08-12 MED ORDER — LEVOTHYROXINE SODIUM 50 MCG PO TABS
50.0000 ug | ORAL_TABLET | Freq: Every day | ORAL | Status: DC
  Administered 2017-08-12 – 2017-08-16 (×5): 50 ug via ORAL
  Filled 2017-08-12 (×5): qty 1

## 2017-08-12 MED ORDER — IOPAMIDOL (ISOVUE-300) INJECTION 61%
100.0000 mL | Freq: Once | INTRAVENOUS | Status: AC | PRN
  Administered 2017-08-12: 100 mL via INTRAVENOUS

## 2017-08-12 MED ORDER — ONDANSETRON HCL 4 MG/2ML IJ SOLN
4.0000 mg | Freq: Four times a day (QID) | INTRAMUSCULAR | Status: DC | PRN
  Administered 2017-08-15: 4 mg via INTRAVENOUS

## 2017-08-12 MED ORDER — ACETAMINOPHEN 650 MG RE SUPP: 650.0000 mg | Freq: Four times a day (QID) | RECTAL | Status: DC | PRN

## 2017-08-12 MED ORDER — METRONIDAZOLE IN NACL 5-0.79 MG/ML-% IV SOLN
500.0000 mg | Freq: Three times a day (TID) | INTRAVENOUS | Status: DC
  Administered 2017-08-12 – 2017-08-13 (×4): 500 mg via INTRAVENOUS
  Filled 2017-08-12 (×7): qty 100

## 2017-08-12 MED ORDER — PANTOPRAZOLE SODIUM 40 MG PO TBEC
40.0000 mg | DELAYED_RELEASE_TABLET | Freq: Every day | ORAL | Status: DC
  Administered 2017-08-12 – 2017-08-16 (×5): 40 mg via ORAL
  Filled 2017-08-12 (×5): qty 1

## 2017-08-12 MED ORDER — APIXABAN 5 MG PO TABS
5.0000 mg | ORAL_TABLET | Freq: Two times a day (BID) | ORAL | Status: DC
  Administered 2017-08-12 – 2017-08-16 (×9): 5 mg via ORAL
  Filled 2017-08-12 (×9): qty 1

## 2017-08-12 MED ORDER — PREDNISONE 20 MG PO TABS
10.0000 mg | ORAL_TABLET | Freq: Every day | ORAL | Status: DC
  Administered 2017-08-12 – 2017-08-16 (×5): 10 mg via ORAL
  Filled 2017-08-12 (×5): qty 1

## 2017-08-12 MED ORDER — ONDANSETRON HCL 4 MG PO TABS: 4.0000 mg | ORAL_TABLET | Freq: Four times a day (QID) | ORAL | Status: DC | PRN

## 2017-08-12 MED ORDER — HYDRALAZINE HCL 25 MG PO TABS
25.0000 mg | ORAL_TABLET | Freq: Three times a day (TID) | ORAL | Status: DC
  Administered 2017-08-12 – 2017-08-16 (×7): 25 mg via ORAL
  Filled 2017-08-12 (×11): qty 1

## 2017-08-12 MED ORDER — BOOST / RESOURCE BREEZE PO LIQD
1.0000 | Freq: Three times a day (TID) | ORAL | Status: DC
  Administered 2017-08-13 (×3): 1 via ORAL

## 2017-08-12 MED ORDER — TADALAFIL (PAH) 20 MG PO TABS
40.0000 mg | ORAL_TABLET | Freq: Every day | ORAL | Status: DC
  Administered 2017-08-12 – 2017-08-16 (×4): 40 mg via ORAL
  Filled 2017-08-12 (×5): qty 2

## 2017-08-12 MED ORDER — SOTALOL HCL 80 MG PO TABS
80.0000 mg | ORAL_TABLET | Freq: Two times a day (BID) | ORAL | Status: DC
  Administered 2017-08-12 – 2017-08-16 (×9): 80 mg via ORAL
  Filled 2017-08-12 (×10): qty 1

## 2017-08-12 MED ORDER — SODIUM CHLORIDE 0.9 % IV SOLN
Freq: Once | INTRAVENOUS | Status: AC
  Administered 2017-08-12: 05:00:00 via INTRAVENOUS

## 2017-08-12 MED ORDER — DEXTROSE 5 % IV SOLN
2.0000 g | INTRAVENOUS | Status: DC
  Filled 2017-08-12: qty 2

## 2017-08-12 MED ORDER — OXYCODONE HCL 5 MG PO TABS: 5.0000 mg | ORAL_TABLET | Freq: Four times a day (QID) | ORAL | Status: DC | PRN

## 2017-08-12 MED ORDER — FUROSEMIDE 20 MG PO TABS
20.0000 mg | ORAL_TABLET | Freq: Every day | ORAL | Status: DC
  Administered 2017-08-13 – 2017-08-16 (×3): 20 mg via ORAL
  Filled 2017-08-12 (×3): qty 1

## 2017-08-12 MED ORDER — SODIUM CHLORIDE 0.9 % IV SOLN
INTRAVENOUS | Status: DC
  Administered 2017-08-12 – 2017-08-15 (×5): via INTRAVENOUS

## 2017-08-12 MED ORDER — TREPROSTINIL 0.6 MG/ML IN SOLN
36.0000 ug | Freq: Four times a day (QID) | RESPIRATORY_TRACT | Status: DC
  Administered 2017-08-12 – 2017-08-16 (×16): 36 ug via RESPIRATORY_TRACT
  Filled 2017-08-12: qty 2.9

## 2017-08-12 MED ORDER — TREPROSTINIL 0.6 MG/ML IN SOLN: 36.0000 ug | Freq: Four times a day (QID) | RESPIRATORY_TRACT | Status: DC

## 2017-08-12 MED ORDER — METRONIDAZOLE IN NACL 5-0.79 MG/ML-% IV SOLN: 500.0000 mg | Freq: Once | INTRAVENOUS | Status: DC

## 2017-08-12 NOTE — Consult Note (Signed)
SURGICAL CONSULTATION NOTE   HISTORY OF PRESENT ILLNESS (HPI):  76 y.o. female presented to [facility] ED for evaluation of abdominal pain since a month ago. Patient reports that she started with abdominal pain since a month ago when she saw her PCP  And was started on Augmenting and managed outpatient. The pain come and goes and was stable until last week when it became worst. Yesterday the patient refers that the pain was associanted with ~10 episodes of diarrhea, diaphoresis and weakness. Pain is described as sharp on the lower abdomen more on the right lower quadrant, which she describe differently from other diverticulitis episodes that the pain was on the left lower quadrant. Patient denies fever or chills. Also denies nausea and vomiting. Patient also without appetite, has not eaten in last 24 hours.   Patient has history of recurrent diverticulitis and was evaluated for elective resection on January 2018, but upon evaluation for clearance by her Pulmonologist due to Pulmonary hypertension and fibrosis, the Pulmonologist told her that she was not a surgical candidate and would not tolerate general anesthesia.    PAST MEDICAL HISTORY (PMH):  Past Medical History:  Diagnosis Date  . A-fib ([place])   . Chronic diarrhea   . Diverticulitis   . GERD (gastroesophageal reflux disease)    usually takes protonix  . Hip fracture ([place]) 11/2015   hairline fracture on right.  no surgery, just physical therapy  . Hypertension   . IBS (irritable bowel syndrome)   . IBS (irritable bowel syndrome)   . Interstitial lung disease ([place])    does not remember when  . Pulmonary fibrosis ([place])   . Pulmonary hypertension ([place])   . Seasonal affective disorder ([place])   . Shortness of breath dyspnea    with exertion  . Type 2 diabetes mellitus ([place]) 11/04/2014     PAST SURGICAL HISTORY ([place]):  Past Surgical History:  Procedure Laterality Date  . ABDOMINAL HYSTERECTOMY     partial  . APPENDECTOMY    . COLONOSCOPY     . NISSEN FUNDOPLICATION  9476   came apart in 2016 and ibs and reflux returned  . TONSILLECTOMY     and adenoidectomy     MEDICATIONS:  Prior to Admission medications   Medication Sig Start Date End Date Taking? Authorizing Provider  apixaban (ELIQUIS) 5 MG TABS tablet Take 5 mg by mouth 2 (two) times daily.    [provider]  azithromycin (ZITHROMAX Z-PAK) 250 MG tablet Take 1 tablet (250 mg total) by mouth daily. Patient not taking: Reported on 02/21/2017 10/27/16   Demetrios Loll, MD  Calcium Carbonate-Vitamin D (CALCIUM 600+D) 600-200 MG-UNIT TABS Take 1 tablet by mouth daily.    [provider]  cholestyramine Lucrezia Starch) 4 GM/DOSE powder Take 4 g by mouth every morning.     [provider]  FLUoxetine (PROZAC) 20 MG capsule Take 20 mg by mouth daily.    [provider]  hydrALAZINE (APRESOLINE) 25 MG tablet Take 25 mg by mouth 3 (three) times daily.     [provider]  levothyroxine (SYNTHROID, LEVOTHROID) 50 MCG tablet Take 50 mcg by mouth daily before breakfast.    [provider]  losartan (COZAAR) 100 MG tablet Take 100 mg by mouth daily.     [provider]  metoprolol succinate (TOPROL-XL) 50 MG 24 hr tablet Take 50 mg by mouth daily.    [provider]  pantoprazole (PROTONIX) 40 MG tablet Take 1 tablet (40 mg total)  by mouth daily. 03/29/15   Glendon Axe, MD  predniSONE (DELTASONE) 10 MG tablet Take 10 mg by mouth daily. 01/13/17   [provider]  predniSONE (DELTASONE) 5 MG tablet 50 mg po daily for 7 days, then decrease 5 mg each day until complete. Patient not taking: Reported on 02/21/2017 10/27/16   Demetrios Loll, MD  ranitidine (ZANTAC) 300 MG tablet Take 300 mg by mouth at bedtime.     [provider]  sotalol (BETAPACE) 80 MG tablet Take 1 tablet (80 mg total) by mouth every 12 (twelve) hours. 03/29/15   Glendon Axe, MD     ALLERGIES:  Allergies  Allergen Reactions  . Amlodipine  Swelling  . Nexium [Esomeprazole Magnesium] Diarrhea  . Aspartame Other (See Comments)    Patient reports "self diagnosed intolerance" to artificial sweeteners.     SOCIAL HISTORY:  Social History   Socioeconomic History  . Marital status: Divorced    Spouse name: Not on file  . Number of children: Not on file  . Years of education: Not on file  . Highest education level: Not on file  Social Needs  . Financial resource strain: Not on file  . Food insecurity - worry: Not on file  . Food insecurity - inability: Not on file  . Transportation needs - medical: Not on file  . Transportation needs - non-medical: Not on file  Occupational History  . Not on file  Tobacco Use  . Smoking status: Never Smoker  . Smokeless tobacco: Never Used  . Tobacco comment: no passive smokers in home  Substance and Sexual Activity  . Alcohol use: No    Alcohol/week: 0.0 oz  . Drug use: No  . Sexual activity: Not on file  Other Topics Concern  . Not on file  Social History Narrative  . Not on file     FAMILY HISTORY:  Family History  Problem Relation Age of Onset  . Breast cancer Mother   . Diabetes Mellitus II Mother   . Hypothyroidism Mother   . Atrial fibrillation Mother   . Heart attack Father   . Diabetes Maternal Grandmother   . Heart attack Maternal Grandfather      REVIEW OF SYSTEMS:  Constitutional: denies weight loss, fever, chills, or sweats. Positive for diaphoresis and weakness  Eyes: denies any other vision changes, history of eye injury  ENT: denies sore throat, hearing problems  Respiratory: denies shortness of breath, wheezing  Cardiovascular: denies chest pain, palpitations  Gastrointestinal: See HPI for pertinent positives Genitourinary: denies burning with urination or urinary frequency Musculoskeletal: denies any other joint pains or cramps  Skin: denies any other rashes or skin discolorations  Neurological: denies any other headache, dizziness, weakness   Psychiatric: denies any other depression, anxiety   All other review of systems were negative   VITAL SIGNS:  Temp:  [98.5 F (36.9 C)] 98.5 F (36.9 C) (11/11 1940) Pulse Rate:  [78-91] 90 (11/12 0330) BP: (131-139)/(65-82) 134/67 (11/12 0330) SpO2:  [89 %-96 %] 89 % (11/12 0330) Weight:  [65.8 kg (145 lb)] 65.8 kg (145 lb) (11/11 1941)     Height: _0  (162.6 cm) Weight: 65.8 kg (145 lb) BMI (Calculated): 24.88   PHYSICAL EXAM:  Constitutional:  -- Normal body habitus  -- Awake, alert, and oriented x3  Eyes:  -- Pupils equally round and reactive to light  -- No scleral icterus  Ear, nose, and throat:  -- No jugular venous distension  Pulmonary:  --  No crackles  -- Equal breath sounds bilaterally -- Breathing non-labored at rest Cardiovascular:  -- S1, S2 present, no murmurs -- No pericardial rubs Gastrointestinal:  -- Abdomen soft and depressible, tender to palpation on the right lower quadrant, mild discomfort on the left lower quadrant, no guarding or rebound tenderness -- No abdominal masses appreciated, pulsatile or otherwise  Musculoskeletal and Integumentary:  -- Wounds or skin discoloration: None appreciated -- Extremities: no cyanosis  Neurologic:  -- Motor function: intact and symmetric   Labs:  CBC Latest Ref Rng & Units 08/11/2017 02/21/2017 10/27/2016  WBC 3.6 - 11.0 K/uL 14.8(H) 14.1(H) 8.7  Hemoglobin 12.0 - 16.0 g/dL 14.5 13.7 13.2  Hematocrit 35.0 - 47.0 % 44.6 40.7 38.7  Platelets 150 - 440 K/uL 318 277 285   CMP Latest Ref Rng & Units 08/11/2017 02/21/2017 10/27/2016  Glucose 65 - 99 mg/dL 111(H) 143(H) 158(H)  BUN 6 - 20 mg/dL _0 Creatinine 0.44 - 1.00 mg/dL 0.99 1.43(H) 0.83  Sodium 135 - 145 mmol/L 138 139 138  Potassium 3.5 - 5.1 mmol/L 4.2 4.0 4.2  Chloride 101 - 111 mmol/L 110 108 108  CO2 22 - 32 mmol/L 20(L) 24 24  Calcium 8.9 - 10.3 mg/dL 9.1 8.5(L) 9.0  Total Protein 6.5 - 8.1 g/dL 6.6 5.8(L) -  Total Bilirubin 0.3 - 1.2  mg/dL 0.7 0.6 -  Alkaline Phos 38 - 126 U/L 71 62 -  AST 15 - 41 U/L 21 56(H) -  ALT 14 - 54 U/L 13(L) 33 -    Imaging studies:  Abdominopelvic CT scan images reviewed and found with extensive diverticular disease without a focal segment of acute fat stranding. The pericolonic collection looks also looks chronic as seen on a CT scan on 08/31/16.   Assessment/Plan:  76 y.o. female with abdominal pain most likely due to an episode of acute over chronic diverticulitis vs colitis, complicated by pertinent comorbidities including pulmonary fibrosis with pulmonary hypertension. The patient has been admitted with this diagnosis in the past, around a year ago and after discharge was evaluated for elective sigmoidectomy. Her Pulmonologist told her that she is not a surgical candidate. On physical exam the pain is mostly on the right lower quadrant pain that is not consistent with the CT scan finding. The pericolonic collection above the bladder is on the same location as a collection in a previous CT scan consistent with a chronic collection and not a new abscess. The patient has mild leukocytosis which is the same level as 6 months ago and both are consistent with a course of steroids for bronchitis. Since patient has not been able to eat >24 hours due to decrease appetite due to pain it is recommended to treat with IV antibiotic therapy. Since patient is not a surgical candidate due to her comorbidities, I discussed the case with the ED physician to recommend evaluation by Internal Medicine for antibiotic therapy and surgery service will follow patient with abdominal exams. ED physicial will start patient on a cephalosporin and metronidazole as she has been recently on augmenting for this problem and an episode of bronchitis. Will follow.   All of the above findings and recommendations were discussed with the patient and all of patient's questions were answered to her expressed satisfaction.

## 2017-08-12 NOTE — H&P (Signed)
[place] [facility] [place] NAME: Julie Mcmillan    MR#:  [id number]  DATE OF BIRTH:  Feb 06, 1941  DATE OF ADMISSION:  08/11/2017  PRIMARY CARE PHYSICIAN: Glendon Axe, MD   REQUESTING/REFERRING PHYSICIAN:   CHIEF COMPLAINT:   Chief Complaint  Patient presents with  . Diarrhea    HISTORY OF PRESENT ILLNESS: Julie Mcmillan  is a 76 y.o. female with a known history of atrial fibrillation, diverticulitis, GERD, hypertension, pulmonary fibrosis, pulmonary hypertension, type 2 diabetes mellitus presented to the emergency room with abdominal pain and diarrhea for the last 2 days. Diarrhea is watery stool. The abdominal pain is aching in nature 7 out of 10 on a scale of 1-10. Patient was worked up with CT abdomen which showed diverticulitis and abscess. Case was discussed with the surgical attending by ER physician and surgical consultation was done who recommended IV antibiotic therapy for now. No complaints of any chest pain, shortness of breath. Patient follows up at [facility] for pulmonary fibrosis and pulmonary hypertension and currently on oxygen at home and on long-term steroids. Stool for C. difficile toxin is negative.  PAST MEDICAL HISTORY:   Past Medical History:  Diagnosis Date  . A-fib ([place])   . Chronic diarrhea   . Diverticulitis   . GERD (gastroesophageal reflux disease)    usually takes protonix  . Hip fracture ([place]) 11/2015   hairline fracture on right.  no surgery, just physical therapy  . Hypertension   . IBS (irritable bowel syndrome)   . IBS (irritable bowel syndrome)   . Interstitial lung disease ([place])    does not remember when  . Pulmonary fibrosis ([place])   . Pulmonary hypertension ([place])   . Seasonal affective disorder ([place])   . Shortness of breath dyspnea    with exertion  . Type 2 diabetes mellitus ([place]) 11/04/2014    PAST SURGICAL HISTORY:  Past Surgical History:  Procedure Laterality Date  . ABDOMINAL  HYSTERECTOMY     partial  . APPENDECTOMY    . COLONOSCOPY    . NISSEN FUNDOPLICATION  8270   came apart in 2016 and ibs and reflux returned  . TONSILLECTOMY     and adenoidectomy    SOCIAL HISTORY:  Social History   Tobacco Use  . Smoking status: Never Smoker  . Smokeless tobacco: Never Used  . Tobacco comment: no passive smokers in home  Substance Use Topics  . Alcohol use: No    Alcohol/week: 0.0 oz    FAMILY HISTORY:  Family History  Problem Relation Age of Onset  . Breast cancer Mother   . Diabetes Mellitus II Mother   . Hypothyroidism Mother   . Atrial fibrillation Mother   . Heart attack Father   . Diabetes Maternal Grandmother   . Heart attack Maternal Grandfather     DRUG ALLERGIES:  Allergies  Allergen Reactions  . Amlodipine Swelling  . Nexium [Esomeprazole Magnesium] Diarrhea  . Aspartame Other (See Comments)    Patient reports "self diagnosed intolerance" to artificial sweeteners.    REVIEW OF SYSTEMS:   CONSTITUTIONAL: No fever, fatigue or weakness.  EYES: No blurred or double vision.  EARS, NOSE, AND THROAT: No tinnitus or ear pain.  RESPIRATORY: No cough, shortness of breath, wheezing or hemoptysis.  CARDIOVASCULAR: No chest pain, orthopnea, edema.  GASTROINTESTINAL:Has nausea, vomiting, diarrhea and abdominal pain.  GENITOURINARY: No dysuria, hematuria.  ENDOCRINE: No polyuria, nocturia,  HEMATOLOGY: No anemia, easy bruising or  bleeding SKIN: No rash or lesion. MUSCULOSKELETAL: No joint pain or arthritis.   NEUROLOGIC: No tingling, numbness, weakness.  PSYCHIATRY: No anxiety or depression.   MEDICATIONS AT HOME:  Prior to Admission medications   Medication Sig Start Date End Date Taking? Authorizing Provider  apixaban (ELIQUIS) 5 MG TABS tablet Take 5 mg by mouth 2 (two) times daily.    [provider]  azithromycin (ZITHROMAX Z-PAK) 250 MG tablet Take 1 tablet (250 mg total) by mouth daily. Patient not taking: Reported on  02/21/2017 10/27/16   Demetrios Loll, MD  Calcium Carbonate-Vitamin D (CALCIUM 600+D) 600-200 MG-UNIT TABS Take 1 tablet by mouth daily.    [provider]  cholestyramine Lucrezia Starch) 4 GM/DOSE powder Take 4 g by mouth every morning.     [provider]  FLUoxetine (PROZAC) 20 MG capsule Take 20 mg by mouth daily.    [provider]  hydrALAZINE (APRESOLINE) 25 MG tablet Take 25 mg by mouth 3 (three) times daily.     [provider]  levothyroxine (SYNTHROID, LEVOTHROID) 50 MCG tablet Take 50 mcg by mouth daily before breakfast.    [provider]  losartan (COZAAR) 100 MG tablet Take 100 mg by mouth daily.     [provider]  metoprolol succinate (TOPROL-XL) 50 MG 24 hr tablet Take 50 mg by mouth daily.    [provider]  pantoprazole (PROTONIX) 40 MG tablet Take 1 tablet (40 mg total) by mouth daily. 03/29/15   Glendon Axe, MD  predniSONE (DELTASONE) 10 MG tablet Take 10 mg by mouth daily. 01/13/17   [provider]  predniSONE (DELTASONE) 5 MG tablet 50 mg po daily for 7 days, then decrease 5 mg each day until complete. Patient not taking: Reported on 02/21/2017 10/27/16   Demetrios Loll, MD  ranitidine (ZANTAC) 300 MG tablet Take 300 mg by mouth at bedtime.     [provider]  sotalol (BETAPACE) 80 MG tablet Take 1 tablet (80 mg total) by mouth every 12 (twelve) hours. 03/29/15   Glendon Axe, MD      PHYSICAL EXAMINATION:   VITAL SIGNS: Blood pressure 134/67, pulse 90, temperature 98.5 F (36.9 C), temperature source Oral, height _0  (1.626 m), weight 65.8 kg (145 lb), SpO2 (!) 89 %.  GENERAL:  76 y.o.-year-old patient lying in the bed with no acute distress.  EYES: Pupils equal, round, reactive to light and accommodation. No scleral icterus. Extraocular muscles intact.  HEENT: Head atraumatic, normocephalic. Oropharynx and nasopharynx clear.  NECK:  Supple, no jugular venous distention. No thyroid enlargement,  no tenderness.  LUNGS: Normal breath sounds bilaterally, no wheezing, rales,rhonchi or crepitation. No use of accessory muscles of respiration.  CARDIOVASCULAR: S1, S2 normal. No murmurs, rubs, or gallops.  ABDOMEN: Soft, tenderness around umbilicus, nondistended. Bowel sounds present. No organomegaly or mass.  EXTREMITIES: No pedal edema, cyanosis, or clubbing.  NEUROLOGIC: Cranial nerves II through XII are intact. Muscle strength 5/5 in all extremities. Sensation intact. Gait not checked.  PSYCHIATRIC: The patient is alert and oriented x 3.  SKIN: No obvious rash, lesion, or ulcer.   LABORATORY PANEL:   CBC Recent Labs  Lab 08/11/17 1942  WBC 14.8*  HGB 14.5  HCT 44.6  PLT 318  MCV 86.8  MCH 28.2  MCHC 32.5  RDW 13.7   ------------------------------------------------------------------------------------------------------------------  Chemistries  Recent Labs  Lab 08/11/17 1942  NA 138  K 4.2  CL 110  CO2 20*  GLUCOSE 111*  BUN  20  CREATININE 0.99  CALCIUM 9.1  AST 21  ALT 13*  ALKPHOS 71  BILITOT 0.7   ------------------------------------------------------------------------------------------------------------------ estimated creatinine clearance is 44.4 mL/min (by C-G formula based on SCr of 0.99 mg/dL). ------------------------------------------------------------------------------------------------------------------ No results for input(s): TSH, T4TOTAL, T3FREE, THYROIDAB in the last 72 hours.  Invalid input(s): FREET3   Coagulation profile No results for input(s): INR, PROTIME in the last 168 hours. ------------------------------------------------------------------------------------------------------------------- No results for input(s): DDIMER in the last 72 hours. -------------------------------------------------------------------------------------------------------------------  Cardiac Enzymes No results for input(s): CKMB, TROPONINI, MYOGLOBIN in the  last 168 hours.  Invalid input(s): CK ------------------------------------------------------------------------------------------------------------------ Invalid input(s): POCBNP  ---------------------------------------------------------------------------------------------------------------  Urinalysis    Component Value Date/Time   COLORURINE YELLOW (A) 02/21/2017 1234   APPEARANCEUR CLEAR (A) 02/21/2017 1234   LABSPEC 1.024 02/21/2017 1234   PHURINE 5.0 02/21/2017 1234   GLUCOSEU NEGATIVE 02/21/2017 1234   HGBUR NEGATIVE 02/21/2017 [place] 02/21/2017 [place] 02/21/2017 1234   PROTEINUR NEGATIVE 02/21/2017 1234   NITRITE NEGATIVE 02/21/2017 1234   LEUKOCYTESUR SMALL (A) 02/21/2017 1234     RADIOLOGY: Ct Abdomen Pelvis W Contrast  Result Date: 08/12/2017 CLINICAL DATA:  76 year old female with abdominal pain. Concern for diverticulitis. EXAM: CT ABDOMEN AND PELVIS WITH CONTRAST TECHNIQUE: Multidetector CT imaging of the abdomen and pelvis was performed using the standard protocol following bolus administration of intravenous contrast. CONTRAST:  185m ISOVUE-300 IOPAMIDOL (ISOVUE-300) INJECTION 61% COMPARISON:  Abdominal MRI dated 09/14/2016 and CT dated 08/31/2016 and 11/25/2015. FINDINGS: Lower chest: There is fibrosis and subpleural honeycombing reflecting interstitial lung disease. No focal consolidation in the visualized lung bases. There is coronary vascular calcification. There is dilatation of the main pulmonary trunk indicative of underlying pulmonary hypertension. No intra-abdominal free air or free fluid. Hepatobiliary: The liver is unremarkable. There is postsurgical changes of cholecystectomy. There is mild dilatation of the common bile duct measuring up to 12 mm, likely post cholecystectomy changes. Pancreas: There is a 16 mm cystic lesion at the head of the pancreas as seen on the prior CT and MRI. Spleen: Normal in size without focal  abnormality. Adrenals/Urinary Tract: The adrenal glands are unremarkable. Mild bilateral renal cortical scarring primarily involving the upper poles, and left more prominent than right. There is no hydronephrosis on either side. There is uniform excretion and enhancement of both kidneys. The visualized ureters and urinary bladder appear unremarkable. Stomach/Bowel: There is extensive sigmoid diverticulosis with muscular hypertrophy. Thickened appearance of the wall of the sigmoid colon, likely chronic changes, however superimposed acute diverticulitis is not excluded. There is a 2.0 x 2.5 x 2.6 cm pocket/collection containing air and fluid anterior to the sigmoid colon and superior to the bladder as seen on the prior CT. This most likely represents a focally contained diverticular perforation or abscess. There is loss of fat plane between this collection and bladder wall. Although no air is noted within the urinary bladder, an early rectovesical fistula is not excluded. There is a moderate size hiatal hernia. There is abutment of multiple loops of small bowel in the left upper abdomen (series 2, image 52 and coronal series 5, image 27) compatible with adhesions. No bowel obstruction. The appendix is not visualized with certainty. No inflammatory changes identified in the right lower quadrant. Vascular/Lymphatic: Mild aortoiliac atherosclerotic disease. The abdominal aorta and IVC are otherwise unremarkable. No portal venous gas. There is no adenopathy. Reproductive: Hysterectomy. Other: None Musculoskeletal: Osteopenia. There is T8 compression fracture as seen on the prior CT. There is  near complete loss of vertebral body height centrally. Mild buckling of the posterior cortex without retropulsion. No acute fracture. Avascular necrosis of the right femoral head. IMPRESSION: 1. Extensive sigmoid diverticulosis with chronic changes and muscular hypertrophy. An acute on chronic diverticulitis is not excluded.  Correlation with clinical exam recommended. 2. A small pocket/collection containing air and fluid anterior to the sigmoid and superior to the bladder likely represents a diverticular abscess or focally contained perforation. There is loss of fat plane between this collection and the bladder wall. No air is seen within the bladder. This however can predispose to a rectovesical fistula. 3. A 16 mm cystic lesion in the head of the pancreas as seen on the prior CT and MRI. 4. Fibrotic changes of the lung bases with findings of interstitial lung disease. Electronically Signed   By: Anner Crete M.D.   On: 08/12/2017 02:13    EKG: Orders placed or performed during the hospital encounter of 02/21/17  . ED EKG  . ED EKG    IMPRESSION AND PLAN: 76 year old female patient with history of pulmonary fibrosis, pulmonary hypertension, diverticular disease, atrial ablation, type 2 diabetes mellitus presented to the emergency room with abdominal pain, nausea and diarrhea.  Admitting diagnosis 1. Acute diverticulitis 2. Diverticular abscess 3. Abdominal pain 4. Pancreatic cyst 5. Pulmonary hypertension 6. Pulmonary fibrosis Treatment plan Admit patient to medical floor IV fluid hydration Start patient on IV Rocephin and IV Flagyl antibiotic Surgery consultation appreciated and follow-up Clear liquid diet Resume steroids for pulmonary fibrosis   All the records are reviewed and case discussed with ED provider. Management plans discussed with the patient, family and they are in agreement.  CODE STATUS:FULL CODE Code Status History    Date Active Date Inactive Code Status Order ID Comments User Context   10/27/2016 00:39 10/27/2016 18:22 Full Code [id number]  Lance Coon, MD Inpatient   08/31/2016 22:18 09/03/2016 18:08 Full Code [id number]  Hubbard Robinson, MD ED   11/26/2015 00:02 11/29/2015 21:00 Full Code [id number]  Hillary Bow, MD ED   07/04/2015 17:17 07/10/2015 20:11 Full Code [id number]   Marlyce Huge, MD ED   03/25/2015 19:47 03/29/2015 20:44 Full Code [id number]  Vaughan Basta, MD Inpatient    Advance Directive Documentation     Most Recent Value  Type of Advance Directive  Healthcare Power of Attorney  Pre-existing out of facility DNR order (yellow form or pink MOST form)  No data  "MOST" Form in Place?  No data       TOTAL TIME TAKING CARE OF THIS PATIENT: 50 minutes.    Saundra Shelling M.D on 08/12/2017 at 4:36 AM  Between 7am to 6pm - Pager - [phone]  After 6pm go to [email or web address] - password [email or web address] [facility]  [place] [facility]  Office  [phone]  CC: Primary care physician; Glendon Axe, MD

## 2017-08-12 NOTE — ED Notes (Signed)
Patient to be transported to floor after pharm tech finishes up dating medications.

## 2017-08-12 NOTE — Progress Notes (Signed)
Initial Nutrition Assessment  DOCUMENTATION CODES:   Not applicable  INTERVENTION:   RD provided diverticulitis education today  Boost Breeze po TID, each supplement provides 250 kcal and 9 grams of protein  NUTRITION DIAGNOSIS:   Inadequate oral intake related to acute illness as evidenced by meal completion < 50%, other (comment)(clear liquid diet).  GOAL:   Patient will meet greater than or equal to 90% of their needs  MONITOR:   PO intake, Labs, Weight trends, I & O's  REASON FOR ASSESSMENT:   Malnutrition Screening Tool    ASSESSMENT:   76 y.o.femalewith a known history of atrial fibrillation, IBS, diverticulitis, GERD, hypertension, pulmonary fibrosis, pulmonary hypertension, type 2 diabetes mellitus presented to the emergency room with abdominal pain and diarrhea for the last 2 days. Diarrhea is watery stool. The abdominal pain is aching in nature 7 out of 10 on a scale of 1-10. Patient was worked up with CT abdomen which showed diverticulitis and abscess   Met with pt in room today. Pt reports good appetite and oral intake pta. Pt reports frequent diverticulitis flares that usually occur once per year but she has had multiple small flares over the past couple of months. Pt also reports a history of chronic diarrhea that occurs at least once per day that started after a cholecystectomy. Pt reports that she does take daily cholestyramine; this does improve her symptoms. Pt documented to have IBS but reports that she has never been constipated; RD suspects pt with dumping syndrome. Pt has eliminated all fiber rich food sources from her diet to try and prevent diverticulitis flares. Per chart, pt is weight stable. RD provided pt with diverticulitis education today. Discussed the different types of fiber and the proper timing of when to include and exclude fiber from the diet. Pt currently eating <50% of her clear liquid diet. Pt is ordered for Boost Breeze but refused it. Can  switch to Ensure once diet advanced if pt with continued poor appetite.   Medications reviewed and include: synthroid, protonix, prednisone, NaCl _0 /hr, ciprofloxacin, metronidazole, oxycodone   Labs reviewed: Ca 8.3(L) Wbc- 11.1(H)  Nutrition-Focused physical exam completed. Findings are no fat depletion, no muscle depletion, and no edema.   Diet Order:  Diet clear liquid Room service appropriate? Yes; Fluid consistency: Thin  EDUCATION NEEDS:   Education needs have been addressed  Skin:  Reviewed RN Assessment  Last BM:  11/12- type 7  Height:   Ht Readings from Last 1 Encounters:  08/12/17 5' 4" (1.626 m)    Weight:   Wt Readings from Last 1 Encounters:  08/12/17 154 lb 8 oz (70.1 kg)    Ideal Body Weight:  54.5 kg  BMI:  Body mass index is 26.52 kg/m.  Estimated Nutritional Needs:   Kcal:  1500-1700kcal/day   Protein:  70-84g/day   Fluid:  >1.5L/day   Koleen Distance MS, RD, LDN Pager #[phone] After Hours Pager: [phone]

## 2017-08-12 NOTE — ED Notes (Signed)
CT made aware that patient was done with Contrast

## 2017-08-12 NOTE — Progress Notes (Signed)
Julie Mcmillan at [place] NAME: Jerry Haugen    MR#:  [id number]  DATE OF BIRTH:  [birth year]  SUBJECTIVE:  Continues with abdominal pain No vomiting  REVIEW OF SYSTEMS:   Review of Systems  Constitutional: Negative for chills, fever and weight loss.  HENT: Negative for ear discharge, ear pain and nosebleeds.   Eyes: Negative for blurred vision, pain and discharge.  Respiratory: Negative for sputum production, shortness of breath, wheezing and stridor.   Cardiovascular: Negative for chest pain, palpitations, orthopnea and PND.  Gastrointestinal: Positive for abdominal pain and nausea. Negative for diarrhea and vomiting.  Genitourinary: Negative for frequency and urgency.  Musculoskeletal: Negative for back pain and joint pain.  Neurological: Positive for weakness. Negative for sensory change, speech change and focal weakness.  Psychiatric/Behavioral: Negative for depression and hallucinations. The patient is not nervous/anxious.    Tolerating Diet:cld Tolerating PT: not needd  DRUG ALLERGIES:   Allergies  Allergen Reactions  . Amlodipine Swelling  . Nexium [Esomeprazole Magnesium] Diarrhea  . Aspartame Other (See Comments)    Patient reports "self diagnosed intolerance" to artificial sweeteners.    VITALS:  Blood pressure 135/90, pulse 72, temperature 97.6 F (36.4 C), temperature source Oral, resp. rate 16, height _0  (1.626 m), weight 70.1 kg (154 lb 8 oz), SpO2 99 %.  PHYSICAL EXAMINATION:   Physical Exam  GENERAL:  76 y.o.-year-old patient lying in the bed with no acute distress.  EYES: Pupils equal, round, reactive to light and accommodation. No scleral icterus. Extraocular muscles intact.  HEENT: Head atraumatic, normocephalic. Oropharynx and nasopharynx clear.  NECK:  Supple, no jugular venous distention. No thyroid enlargement, no tenderness.  LUNGS: Normal breath sounds bilaterally, no wheezing, rales, rhonchi.  No use of accessory muscles of respiration.  CARDIOVASCULAR: S1, S2 normal. No murmurs, rubs, or gallops.  ABDOMEN: Soft, tender + LLQ, nondistended. Bowel sounds present. No organomegaly or mass.  EXTREMITIES: No cyanosis, clubbing or edema b/l.    NEUROLOGIC: Cranial nerves II through XII are intact. No focal Motor or sensory deficits b/l.   PSYCHIATRIC:  patient is alert and oriented x 3.  SKIN: No obvious rash, lesion, or ulcer.   LABORATORY PANEL:  CBC Recent Labs  Lab 08/12/76 0524  WBC 11.1*  HGB 13.6  HCT 41.0  PLT 275    Chemistries  Recent Labs  Lab 08/11/17 7772 08/12/76 0524  NA 138 138  K 4.2 4.2  CL 110 105  CO2 20* 25  GLUCOSE 111* 103*  BUN 20 18  CREATININE 0.99 0.88  CALCIUM 9.1 8.3*  AST 21  --   ALT 13*  --   ALKPHOS 71  --   BILITOT 0.7  --    Cardiac Enzymes No results for input(s): TROPONINI in the last 168 hours. RADIOLOGY:  Ct Abdomen Pelvis W Contrast  Result Date: 08/12/2017 CLINICAL DATA:  76 year old female with abdominal pain. Concern for diverticulitis. EXAM: CT ABDOMEN AND PELVIS WITH CONTRAST TECHNIQUE: Multidetector CT imaging of the abdomen and pelvis was performed using the standard protocol following bolus administration of intravenous contrast. CONTRAST:  777m ISOVUE-300 IOPAMIDOL (ISOVUE-300) INJECTION 61% COMPARISON:  Abdominal MRI dated 09/14/2016 and CT dated 08/31/2016 and 11/25/2015. FINDINGS: Lower chest: There is fibrosis and subpleural honeycombing reflecting interstitial lung disease. No focal consolidation in the visualized lung bases. There is coronary vascular calcification. There is dilatation of the main pulmonary trunk indicative of underlying pulmonary hypertension. No intra-abdominal free air or  free fluid. Hepatobiliary: The liver is unremarkable. There is postsurgical changes of cholecystectomy. There is mild dilatation of the common bile duct measuring up to 12 mm, likely post cholecystectomy changes. Pancreas:  There is a 16 mm cystic lesion at the head of the pancreas as seen on the prior CT and MRI. Spleen: Normal in size without focal abnormality. Adrenals/Urinary Tract: The adrenal glands are unremarkable. Mild bilateral renal cortical scarring primarily involving the upper poles, and left more prominent than right. There is no hydronephrosis on either side. There is uniform excretion and enhancement of both kidneys. The visualized ureters and urinary bladder appear unremarkable. Stomach/Bowel: There is extensive sigmoid diverticulosis with muscular hypertrophy. Thickened appearance of the wall of the sigmoid colon, likely chronic changes, however superimposed acute diverticulitis is not excluded. There is a 2.0 x 2.5 x 2.6 cm pocket/collection containing air and fluid anterior to the sigmoid colon and superior to the bladder as seen on the prior CT. This most likely represents a focally contained diverticular perforation or abscess. There is loss of fat plane between this collection and bladder wall. Although no air is noted within the urinary bladder, an early rectovesical fistula is not excluded. There is a moderate size hiatal hernia. There is abutment of multiple loops of small bowel in the left upper abdomen (series 2, image 52 and coronal series 5, image 27) compatible with adhesions. No bowel obstruction. The appendix is not visualized with certainty. No inflammatory changes identified in the right lower quadrant. Vascular/Lymphatic: Mild aortoiliac atherosclerotic disease. The abdominal aorta and IVC are otherwise unremarkable. No portal venous gas. There is no adenopathy. Reproductive: Hysterectomy. Other: None Musculoskeletal: Osteopenia. There is T8 compression fracture as seen on the prior CT. There is near complete loss of vertebral body height centrally. Mild buckling of the posterior cortex without retropulsion. No acute fracture. Avascular necrosis of the right femoral head. IMPRESSION: 1. Extensive  sigmoid diverticulosis with chronic changes and muscular hypertrophy. An acute on chronic diverticulitis is not excluded. Correlation with clinical exam recommended. 2. A small pocket/collection containing air and fluid anterior to the sigmoid and superior to the bladder likely represents a diverticular abscess or focally contained perforation. There is loss of fat plane between this collection and the bladder wall. No air is seen within the bladder. This however can predispose to a rectovesical fistula. 3. A 16 mm cystic lesion in the head of the pancreas as seen on the prior CT and MRI. 4. Fibrotic changes of the lung bases with findings of interstitial lung disease. Electronically Signed   By: Anner Crete M.D.   On: 08/12/2017 02:13   ASSESSMENT AND PLAN:   Julie Mcmillan  is a 76 y.o. female with a known history of atrial fibrillation, diverticulitis, GERD, hypertension, pulmonary fibrosis, pulmonary hypertension, type 2 diabetes mellitus presented to the emergency room with abdominal pain and diarrhea for the last 2 days. Diarrhea is watery stool. The abdominal pain is aching in nature 7 out of 10 on a scale of 1-10. Patient was worked up with CT abdomen which showed diverticulitis and abscess  1.  Acute diverticulitis sigmoid with possible focal contained perforation versus abscess  -IV Cipro Flagyl -Clear liquid diet -Surgical consultation appreciated -PRN pain meds IV fluids-  2.  Chronic interstitial pulmonary fibrosis with pulmonary hypertension -Continue prednisone, Tyvaso and adcirca (home meds) -Oxygen, nebulizer as needed  3.  Leukocytosis -Due to #1  4.  History of chronic A. Fib -Continue oral anticoagulation  5.  Hypothyroidism continue Synthroid  Case discussed with Care Management/Social Worker. Management plans discussed with the patient, family and they are in agreement.  CODE STATUS: Full  DVT Prophylaxis: Oral anticoagulation with Eliquis  TOTAL TIME  TAKING CARE OF THIS PATIENT: 25 minutes.  >50% time spent on counselling and coordination of care  POSSIBLE D/C IN few DAYS, DEPENDING ON CLINICAL CONDITION.  Note: This dictation was prepared with Dragon dictation along with smaller phrase technology. Any transcriptional errors that result from this process are unintentional.  Karington Zarazua M.D on 08/12/2017 at 10:03 AM  Between 7am to 6pm - Pager - [phone]  After 6pm go to [email or web address] - password [email or web address] [facility]  Office  [phone]  CC: Primary care physician; Glendon Axe, MD

## 2017-08-12 NOTE — Progress Notes (Signed)
08/12/2017  Subjective: Patient admitted last night with acute diverticulitis with a small abscess, same as prior episode last year.  Not a surgical candidate due to significant pulmonary hypertension and fibrosis.  Had been on Augmentin at home but did not help with her pain.  Started on Ceftriaxone and Flagyl and on clear liquids.  This morning, patient reports having abdominal pain still that has not improved.    Vital signs: Temp:  [97.6 F (36.4 C)-98.5 F (36.9 C)] 97.6 F (36.4 C) (11/12 0656) Pulse Rate:  [59-91] 72 (11/12 0656) Resp:  [16-19] 16 (11/12 0656) BP: (128-149)/(59-105) 135/90 (11/12 0656) SpO2:  [89 %-99 %] 99 % (11/12 0656) Weight:  [65.8 kg (145 lb)-70.1 kg (154 lb 8 oz)] 70.1 kg (154 lb 8 oz) (11/12 0516)   Intake/Output: 11/11 0701 - 11/12 0700 In: 550 [IV Piggyback:550] Out: -     Physical Exam: Constitutional: No acute distress Abdomen: soft, nondistended, with mild tenderness to palpation in the right lower quadrant and low pelvis.  No peritoneal signs.  Labs:  Recent Labs    08/11/17 1942 08/12/17 0524  WBC 14.8* 11.1*  HGB 14.5 13.6  HCT 44.6 41.0  PLT 318 275   Recent Labs    08/11/17 1942 08/12/17 0524  NA 138 138  K 4.2 4.2  CL 110 105  CO2 20* 25  GLUCOSE 111* 103*  BUN 20 18  CREATININE 0.99 0.88  CALCIUM 9.1 8.3*   No results for input(s): LABPROT, INR in the last 72 hours.  Imaging: Ct Abdomen Pelvis W Contrast  Result Date: 08/12/2017 CLINICAL DATA:  76 year old female with abdominal pain. Concern for diverticulitis. EXAM: CT ABDOMEN AND PELVIS WITH CONTRAST TECHNIQUE: Multidetector CT imaging of the abdomen and pelvis was performed using the standard protocol following bolus administration of intravenous contrast. CONTRAST:  123m ISOVUE-300 IOPAMIDOL (ISOVUE-300) INJECTION 61% COMPARISON:  Abdominal MRI dated 09/14/2016 and CT dated 08/31/2016 and 11/25/2015. FINDINGS: Lower chest: There is fibrosis and subpleural  honeycombing reflecting interstitial lung disease. No focal consolidation in the visualized lung bases. There is coronary vascular calcification. There is dilatation of the main pulmonary trunk indicative of underlying pulmonary hypertension. No intra-abdominal free air or free fluid. Hepatobiliary: The liver is unremarkable. There is postsurgical changes of cholecystectomy. There is mild dilatation of the common bile duct measuring up to 12 mm, likely post cholecystectomy changes. Pancreas: There is a 16 mm cystic lesion at the head of the pancreas as seen on the prior CT and MRI. Spleen: Normal in size without focal abnormality. Adrenals/Urinary Tract: The adrenal glands are unremarkable. Mild bilateral renal cortical scarring primarily involving the upper poles, and left more prominent than right. There is no hydronephrosis on either side. There is uniform excretion and enhancement of both kidneys. The visualized ureters and urinary bladder appear unremarkable. Stomach/Bowel: There is extensive sigmoid diverticulosis with muscular hypertrophy. Thickened appearance of the wall of the sigmoid colon, likely chronic changes, however superimposed acute diverticulitis is not excluded. There is a 2.0 x 2.5 x 2.6 cm pocket/collection containing air and fluid anterior to the sigmoid colon and superior to the bladder as seen on the prior CT. This most likely represents a focally contained diverticular perforation or abscess. There is loss of fat plane between this collection and bladder wall. Although no air is noted within the urinary bladder, an early rectovesical fistula is not excluded. There is a moderate size hiatal hernia. There is abutment of multiple loops of small bowel in the  left upper abdomen (series 2, image 52 and coronal series 5, image 27) compatible with adhesions. No bowel obstruction. The appendix is not visualized with certainty. No inflammatory changes identified in the right lower quadrant.  Vascular/Lymphatic: Mild aortoiliac atherosclerotic disease. The abdominal aorta and IVC are otherwise unremarkable. No portal venous gas. There is no adenopathy. Reproductive: Hysterectomy. Other: None Musculoskeletal: Osteopenia. There is T8 compression fracture as seen on the prior CT. There is near complete loss of vertebral body height centrally. Mild buckling of the posterior cortex without retropulsion. No acute fracture. Avascular necrosis of the right femoral head. IMPRESSION: 1. Extensive sigmoid diverticulosis with chronic changes and muscular hypertrophy. An acute on chronic diverticulitis is not excluded. Correlation with clinical exam recommended. 2. A small pocket/collection containing air and fluid anterior to the sigmoid and superior to the bladder likely represents a diverticular abscess or focally contained perforation. There is loss of fat plane between this collection and the bladder wall. No air is seen within the bladder. This however can predispose to a rectovesical fistula. 3. A 16 mm cystic lesion in the head of the pancreas as seen on the prior CT and MRI. 4. Fibrotic changes of the lung bases with findings of interstitial lung disease. Electronically Signed   By: Anner Crete M.D.   On: 08/12/2017 02:13    Assessment/Plan: 76 yo female with diverticulitis with small abscess.  --will make patient NPO given that she's still having abdominal pain. --since Augmentin did not work as outpatient, will transition to IV cipro and IV flagyl, for cipro/flagyl po regimen for home upon discharge. --patient aware that currently given her pulmonary issues, she's not a candidate for surgery at this point.  There are no urgent or emergent surgical needs.  If there is no improvement with conservative measures, and she ends up requiring surgery, would recommend transferring to [facility] since her pulmonologist is there. --will continue to follow along with you.   Melvyn Neth, [place]

## 2017-08-12 NOTE — ED Notes (Signed)
Surgeon in speaking with patient.

## 2017-08-13 ENCOUNTER — Ambulatory Visit: Payer: Medicare HMO

## 2017-08-13 LAB — URINALYSIS, COMPLETE (UACMP) WITH MICROSCOPIC
Bacteria, UA: NONE SEEN
Bilirubin Urine: NEGATIVE
GLUCOSE, UA: NEGATIVE mg/dL
HGB URINE DIPSTICK: NEGATIVE
Ketones, ur: NEGATIVE mg/dL
Nitrite: NEGATIVE
PH: 5 (ref 5.0–8.0)
PROTEIN: NEGATIVE mg/dL
Specific Gravity, Urine: 1.026 (ref 1.005–1.030)

## 2017-08-13 LAB — CBC WITH DIFFERENTIAL/PLATELET
BASOS ABS: 0.1 10*3/uL (ref 0–0.1)
Basophils Relative: 1 %
Eosinophils Absolute: 0.1 10*3/uL (ref 0–0.7)
Eosinophils Relative: 1 %
HEMATOCRIT: 35.8 % (ref 35.0–47.0)
HEMOGLOBIN: 11.8 g/dL — AB (ref 12.0–16.0)
LYMPHS ABS: 1.6 10*3/uL (ref 1.0–3.6)
LYMPHS PCT: 16 %
MCH: 29.2 pg (ref 26.0–34.0)
MCHC: 33.1 g/dL (ref 32.0–36.0)
MCV: 88.2 fL (ref 80.0–100.0)
Monocytes Absolute: 1.6 10*3/uL — ABNORMAL HIGH (ref 0.2–0.9)
Monocytes Relative: 16 %
NEUTROS ABS: 6.5 10*3/uL (ref 1.4–6.5)
NEUTROS PCT: 66 %
PLATELETS: 241 10*3/uL (ref 150–440)
RBC: 4.06 MIL/uL (ref 3.80–5.20)
RDW: 14 % (ref 11.5–14.5)
WBC: 9.8 10*3/uL (ref 3.6–11.0)

## 2017-08-13 MED ORDER — METRONIDAZOLE 500 MG PO TABS
500.0000 mg | ORAL_TABLET | Freq: Three times a day (TID) | ORAL | Status: DC
  Administered 2017-08-13 – 2017-08-16 (×9): 500 mg via ORAL
  Filled 2017-08-13 (×12): qty 1

## 2017-08-13 MED ORDER — CIPROFLOXACIN HCL 500 MG PO TABS
500.0000 mg | ORAL_TABLET | Freq: Two times a day (BID) | ORAL | Status: DC
  Administered 2017-08-13 – 2017-08-16 (×7): 500 mg via ORAL
  Filled 2017-08-13 (×7): qty 1

## 2017-08-13 MED ORDER — SALINE SPRAY 0.65 % NA SOLN
1.0000 | NASAL | Status: DC | PRN
  Administered 2017-08-14: 1 via NASAL
  Filled 2017-08-13: qty 44

## 2017-08-13 NOTE — Plan of Care (Signed)
Pt experiencing incontinence of stool this afternoon after diarrhea began.

## 2017-08-13 NOTE — Progress Notes (Signed)
08/13/2017  Subjective: No acute events.  Patient reports improved pain.  Was advanced to liquids and she tolerated well.  Vital signs: Temp:  [98.6 F (37 C)-99.5 F (37.5 C)] 98.6 F (37 C) (11/13 1239) Pulse Rate:  [72-97] 74 (11/13 1239) Resp:  [16-20] 20 (11/13 0448) BP: (100-137)/(44-63) 114/48 (11/13 1239) SpO2:  [97 %-100 %] 98 % (11/13 1239)   Intake/Output: 11/12 0701 - 11/13 0700 In: 2681 [P.O.:600; I.V.:1780; IV Piggyback:301] Out: 200 [Urine:200] Last BM Date: 08/12/17  Physical Exam: Constitutional: No acute distress Abdomen: Soft, nondistended, with only minimal discomfort in the low abdomen to palpation.  No peritoneal signs.  Labs:  Recent Labs    08/12/17 0524 08/13/17 0549  WBC 11.1* 9.8  HGB 13.6 11.8*  HCT 41.0 35.8  PLT 275 241   Recent Labs    08/11/17 1942 08/12/17 0524  NA 138 138  K 4.2 4.2  CL 110 105  CO2 20* 25  GLUCOSE 111* 103*  BUN 20 18  CREATININE 0.99 0.88  CALCIUM 9.1 8.3*   No results for input(s): LABPROT, INR in the last 72 hours.  Imaging: No results found.  Assessment/Plan: 76 year old female with acute diverticulitis.  -Can advance today to soft diet. - White blood cell count has normalized now.  Will transition to oral Cipro and Flagyl. -Likely discharge to home tomorrow.   Melvyn Neth, [place]

## 2017-08-13 NOTE — Progress Notes (Signed)
[facility] [place] NAME: Julie Mcmillan    MR#:  [id number]  DATE OF BIRTH:  02-28-1941  SUBJECTIVE:   Patient presently is still complaining of some mild right lower quadrant abdominal pain. Seen by surgery and diet was advanced but having some diarrhea now. Afebrile, white cell count is normal.  REVIEW OF SYSTEMS:   Review of Systems  Constitutional: Negative for chills, fever and weight loss.  HENT: Negative for ear discharge, ear pain and nosebleeds.   Eyes: Negative for blurred vision, pain and discharge.  Respiratory: Negative for sputum production, shortness of breath, wheezing and stridor.   Cardiovascular: Negative for chest pain, palpitations, orthopnea and PND.  Gastrointestinal: Positive for abdominal pain and diarrhea. Negative for nausea and vomiting.  Genitourinary: Negative for frequency and urgency.  Musculoskeletal: Negative for back pain and joint pain.  Neurological: Negative for sensory change, speech change and focal weakness.  Psychiatric/Behavioral: Negative for depression and hallucinations. The patient is not nervous/anxious.    Tolerating Diet: Soft diet Tolerating PT: not needd  DRUG ALLERGIES:   Allergies  Allergen Reactions  . Amlodipine Swelling  . Nexium [Esomeprazole Magnesium] Diarrhea  . Aspartame Other (See Comments)    Patient reports "self diagnosed intolerance" to artificial sweeteners.    VITALS:  Blood pressure (!) 114/48, pulse 74, temperature 98.6 F (37 C), temperature source Oral, resp. rate 20, height _0  (1.626 m), weight 70.1 kg (154 lb 8 oz), SpO2 98 %.  PHYSICAL EXAMINATION:   Physical Exam  GENERAL:  76 y.o.-year-old patient lying in the bed in no acute distress.  EYES: Pupils equal, round, reactive to light and accommodation. No scleral icterus. Extraocular muscles intact.  HEENT: Head atraumatic, normocephalic. Oropharynx and nasopharynx clear.  NECK:  Supple, no  jugular venous distention. No thyroid enlargement, no tenderness.  LUNGS: Normal breath sounds bilaterally, no wheezing, rales, rhonchi. No use of accessory muscles of respiration.  CARDIOVASCULAR: S1, S2 normal. No murmurs, rubs, or gallops.  ABDOMEN: Soft, tender in RLQ, but no rebound, rigidity, nondistended. Bowel sounds present. No organomegaly or mass.  EXTREMITIES: No cyanosis, clubbing or edema b/l.    NEUROLOGIC: Cranial nerves II through XII are intact. No focal Motor or sensory deficits b/l.   PSYCHIATRIC:  patient is alert and oriented x 3.  SKIN: No obvious rash, lesion, or ulcer.   LABORATORY PANEL:  CBC Recent Labs  Lab 08/13/17 0549  WBC 9.8  HGB 11.8*  HCT 35.8  PLT 241    Chemistries  Recent Labs  Lab 08/11/17 1942 08/12/17 0524  NA 138 138  K 4.2 4.2  CL 110 105  CO2 20* 25  GLUCOSE 111* 103*  BUN 20 18  CREATININE 0.99 0.88  CALCIUM 9.1 8.3*  AST 21  --   ALT 13*  --   ALKPHOS 71  --   BILITOT 0.7  --    Cardiac Enzymes No results for input(s): TROPONINI in the last 168 hours. RADIOLOGY:  Ct Abdomen Pelvis W Contrast  Result Date: 08/12/2017 CLINICAL DATA:  76 year old female with abdominal pain. Concern for diverticulitis. EXAM: CT ABDOMEN AND PELVIS WITH CONTRAST TECHNIQUE: Multidetector CT imaging of the abdomen and pelvis was performed using the standard protocol following bolus administration of intravenous contrast. CONTRAST:  19m ISOVUE-300 IOPAMIDOL (ISOVUE-300) INJECTION 61% COMPARISON:  Abdominal MRI dated 09/14/2016 and CT dated 08/31/2016 and 11/25/2015. FINDINGS: Lower chest: There is fibrosis and subpleural honeycombing reflecting interstitial lung disease. No  focal consolidation in the visualized lung bases. There is coronary vascular calcification. There is dilatation of the main pulmonary trunk indicative of underlying pulmonary hypertension. No intra-abdominal free air or free fluid. Hepatobiliary: The liver is unremarkable. There is  postsurgical changes of cholecystectomy. There is mild dilatation of the common bile duct measuring up to 12 mm, likely post cholecystectomy changes. Pancreas: There is a 16 mm cystic lesion at the head of the pancreas as seen on the prior CT and MRI. Spleen: Normal in size without focal abnormality. Adrenals/Urinary Tract: The adrenal glands are unremarkable. Mild bilateral renal cortical scarring primarily involving the upper poles, and left more prominent than right. There is no hydronephrosis on either side. There is uniform excretion and enhancement of both kidneys. The visualized ureters and urinary bladder appear unremarkable. Stomach/Bowel: There is extensive sigmoid diverticulosis with muscular hypertrophy. Thickened appearance of the wall of the sigmoid colon, likely chronic changes, however superimposed acute diverticulitis is not excluded. There is a 2.0 x 2.5 x 2.6 cm pocket/collection containing air and fluid anterior to the sigmoid colon and superior to the bladder as seen on the prior CT. This most likely represents a focally contained diverticular perforation or abscess. There is loss of fat plane between this collection and bladder wall. Although no air is noted within the urinary bladder, an early rectovesical fistula is not excluded. There is a moderate size hiatal hernia. There is abutment of multiple loops of small bowel in the left upper abdomen (series 2, image 52 and coronal series 5, image 27) compatible with adhesions. No bowel obstruction. The appendix is not visualized with certainty. No inflammatory changes identified in the right lower quadrant. Vascular/Lymphatic: Mild aortoiliac atherosclerotic disease. The abdominal aorta and IVC are otherwise unremarkable. No portal venous gas. There is no adenopathy. Reproductive: Hysterectomy. Other: None Musculoskeletal: Osteopenia. There is T8 compression fracture as seen on the prior CT. There is near complete loss of vertebral body height  centrally. Mild buckling of the posterior cortex without retropulsion. No acute fracture. Avascular necrosis of the right femoral head. IMPRESSION: 1. Extensive sigmoid diverticulosis with chronic changes and muscular hypertrophy. An acute on chronic diverticulitis is not excluded. Correlation with clinical exam recommended. 2. A small pocket/collection containing air and fluid anterior to the sigmoid and superior to the bladder likely represents a diverticular abscess or focally contained perforation. There is loss of fat plane between this collection and the bladder wall. No air is seen within the bladder. This however can predispose to a rectovesical fistula. 3. A 16 mm cystic lesion in the head of the pancreas as seen on the prior CT and MRI. 4. Fibrotic changes of the lung bases with findings of interstitial lung disease. Electronically Signed   By: Anner Crete M.D.   On: 08/12/2017 02:13   ASSESSMENT AND PLAN:   Julie Mcmillan  is a 76 y.o. female with a known history of atrial fibrillation, diverticulitis, GERD, hypertension, pulmonary fibrosis, pulmonary hypertension, type 2 diabetes mellitus presented to the emergency room with abdominal pain and diarrhea for the last 2 days. Diarrhea is watery stool. The abdominal pain is aching in nature 7 out of 10 on a scale of 1-10. Patient was worked up with CT abdomen which showed diverticulitis and abscess  1.  Acute diverticulitis sigmoid with possible focal contained perforation versus abscess -Followed by surgery and no plans for acute surgical intervention given the high risk pulmonary status from a pulmonary hypertension. -Patient has somewhat improved with medical therapy  with IV antibiotics and supportive care. Switched from IV antibiotics to oral Cipro Flagyl today. Diet advance to soft as per surgery. We'll continue to monitor. Currently afebrile and hemodynamically stable with a normal white cell count.  2.  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 Pt. Follows at [facility].   3.  Leukocytosis - due to # 1 and now resolved.   4.  History of chronic A. Fib -  Rate controlled. Cont. Sotalol.  - cont. Eliquis.   5.  Hypothyroidism continue Synthroid  Case discussed with Care Management/Social Worker. Management plans discussed with the patient, family and they are in agreement.  CODE STATUS: Full  DVT Prophylaxis: Oral anticoagulation with Eliquis  TOTAL TIME TAKING CARE OF THIS PATIENT: 25 minutes.   POSSIBLE D/C IN few DAYS, DEPENDING ON CLINICAL CONDITION.  Note: This dictation was prepared with Dragon dictation along with smaller phrase technology. Any transcriptional errors that result from this process are unintentional.  Henreitta Leber M.D on 08/13/2017 at 2:28 PM  Between 7am to 6pm - Pager - [phone].   After 6pm go to [email or web address] - password [email or web address] [facility]  Office  [phone]  CC: Primary care physician; Glendon Axe, MD

## 2017-08-14 LAB — BASIC METABOLIC PANEL
Anion gap: 7 (ref 5–15)
BUN: 10 mg/dL (ref 6–20)
CALCIUM: 8 mg/dL — AB (ref 8.9–10.3)
CHLORIDE: 107 mmol/L (ref 101–111)
CO2: 21 mmol/L — AB (ref 22–32)
CREATININE: 0.79 mg/dL (ref 0.44–1.00)
GFR calc non Af Amer: >60 mL/min (ref >60)
GLUCOSE: 102 mg/dL — AB (ref 65–99)
Potassium: 3.2 mmol/L — ABNORMAL LOW (ref 3.5–5.1)
Sodium: 135 mmol/L (ref 135–145)

## 2017-08-14 LAB — CBC
HEMATOCRIT: 34.9 % — AB (ref 35.0–47.0)
Hemoglobin: 11.5 g/dL — ABNORMAL LOW (ref 12.0–16.0)
MCH: 28.8 pg (ref 26.0–34.0)
MCHC: 32.9 g/dL (ref 32.0–36.0)
MCV: 87.5 fL (ref 80.0–100.0)
Platelets: 238 10*3/uL (ref 150–440)
RBC: 3.99 MIL/uL (ref 3.80–5.20)
RDW: 13.2 % (ref 11.5–14.5)
WBC: 9.5 10*3/uL (ref 3.6–11.0)

## 2017-08-14 MED ORDER — POTASSIUM CHLORIDE CRYS ER 20 MEQ PO TBCR
40.0000 meq | EXTENDED_RELEASE_TABLET | Freq: Two times a day (BID) | ORAL | Status: AC
  Administered 2017-08-14 – 2017-08-15 (×3): 40 meq via ORAL
  Filled 2017-08-14 (×3): qty 2

## 2017-08-14 MED ORDER — RISAQUAD PO CAPS
1.0000 | ORAL_CAPSULE | Freq: Every day | ORAL | Status: DC
  Administered 2017-08-14 – 2017-08-16 (×3): 1 via ORAL
  Filled 2017-08-14 (×3): qty 1

## 2017-08-14 NOTE — Progress Notes (Signed)
08/14/2017  Subjective: Patient had diarrhea yesterday with some incontinence.  Started on soft diet but did not have dinner last night.  Reports some sharp pain in the lower abdomen.  Vital signs: Temp:  [98.6 F (37 C)-100 F (37.8 C)] 100 F (37.8 C) (11/14 0502) Pulse Rate:  [69-97] 83 (11/14 0502) Resp:  [18] 18 (11/14 0502) BP: (114-137)/(48-63) 129/53 (11/14 0502) SpO2:  [97 %-99 %] 98 % (11/14 0502)   Intake/Output: 11/13 0701 - 11/14 0700 In: 2783 [P.O.:660; I.V.:2123] Out: 200 [Urine:200] Last BM Date: 08/13/17  Physical Exam: Constitutional: No acute distress Abdomen:  Soft, nondistended, with mild tenderness to palpation in the lower abdomen.  Labs:  Recent Labs    08/13/17 0549 08/14/17 0601  WBC 9.8 9.5  HGB 11.8* 11.5*  HCT 35.8 34.9*  PLT 241 238   Recent Labs    08/12/17 0524 08/14/17 0601  NA 138 135  K 4.2 3.2*  CL 105 107  CO2 25 21*  GLUCOSE 103* 102*  BUN 18 10  CREATININE 0.88 0.79  CALCIUM 8.3* 8.0*   No results for input(s): LABPROT, INR in the last 72 hours.  Imaging: No results found.  Assessment/Plan: 76 yo female with acute diverticulitis with small abscess  --continue soft diet today and oral antibiotics.  WBC normal today on oral abx. --cdiff was negative two days ago.  May consider probiotics to help with diarrhea, which may be related to antibiotics. --will continue to follow.   Melvyn Neth, [place]

## 2017-08-14 NOTE — Progress Notes (Signed)
[facility] [place] NAME: Julie Mcmillan    MR#:  [id number]  DATE OF BIRTH:  03/16/1941  SUBJECTIVE:   Patient having some mild abdominal pain today and also some loose stools. Appetite is poor and does not feel that much better today. Seen by surgery and  will hold off on discharge for 1 more day and continue current care for now.  REVIEW OF SYSTEMS:   Review of Systems  Constitutional: Negative for chills, fever and weight loss.  HENT: Negative for ear discharge, ear pain and nosebleeds.   Eyes: Negative for blurred vision, pain and discharge.  Respiratory: Negative for sputum production, shortness of breath, wheezing and stridor.   Cardiovascular: Negative for chest pain, palpitations, orthopnea and PND.  Gastrointestinal: Positive for abdominal pain and diarrhea. Negative for nausea and vomiting.  Genitourinary: Negative for frequency and urgency.  Musculoskeletal: Negative for back pain and joint pain.  Neurological: Negative for sensory change, speech change and focal weakness.  Psychiatric/Behavioral: Negative for depression and hallucinations. The patient is not nervous/anxious.    Tolerating Diet: Soft diet Tolerating PT: not needd  DRUG ALLERGIES:   Allergies  Allergen Reactions  . Amlodipine Swelling  . Nexium [Esomeprazole Magnesium] Diarrhea  . Aspartame Other (See Comments)    Patient reports "self diagnosed intolerance" to artificial sweeteners.    VITALS:  Blood pressure (!) 127/54, pulse 87, temperature 98.5 F (36.9 C), temperature source Oral, resp. rate 16, height _0  (1.626 m), weight 70.1 kg (154 lb 8 oz), SpO2 94 %.  PHYSICAL EXAMINATION:   Physical Exam  GENERAL:  76 y.o.-year-old patient lying in the bed in no acute distress.  EYES: Pupils equal, round, reactive to light and accommodation. No scleral icterus. Extraocular muscles intact.  HEENT: Head atraumatic, normocephalic. Oropharynx and  nasopharynx clear.  NECK:  Supple, no jugular venous distention. No thyroid enlargement, no tenderness.  LUNGS: Normal breath sounds bilaterally, no wheezing, rales, rhonchi. No use of accessory muscles of respiration.  CARDIOVASCULAR: S1, S2 normal. No murmurs, rubs, or gallops.  ABDOMEN: Soft, tender in RLQ, but no rebound, rigidity, nondistended. Bowel sounds present. No organomegaly or mass.  EXTREMITIES: No cyanosis, clubbing or edema b/l.    NEUROLOGIC: Cranial nerves II through XII are intact. No focal Motor or sensory deficits b/l.   PSYCHIATRIC:  patient is alert and oriented x 3.  SKIN: No obvious rash, lesion, or ulcer.   LABORATORY PANEL:  CBC Recent Labs  Lab 08/14/17 0601  WBC 9.5  HGB 11.5*  HCT 34.9*  PLT 238    Chemistries  Recent Labs  Lab 08/11/17 1942  08/14/17 0601  NA 138   < > 135  K 4.2   < > 3.2*  CL 110   < > 107  CO2 20*   < > 21*  GLUCOSE 111*   < > 102*  BUN 20   < > 10  CREATININE 0.99   < > 0.79  CALCIUM 9.1   < > 8.0*  AST 21  --   --   ALT 13*  --   --   ALKPHOS 71  --   --   BILITOT 0.7  --   --    < > = values in this interval not displayed.   Cardiac Enzymes No results for input(s): TROPONINI in the last 168 hours. RADIOLOGY:  No results found. ASSESSMENT AND PLAN:   Julie Mcmillan  is a 76  y.o. female with a known history of atrial fibrillation, diverticulitis, GERD, hypertension, pulmonary fibrosis, pulmonary hypertension, type 2 diabetes mellitus presented to the emergency room with abdominal pain and diarrhea for the last 2 days. Diarrhea is watery stool. The abdominal pain is aching in nature 7 out of 10 on a scale of 1-10. Patient was worked up with CT abdomen which showed diverticulitis and abscess  1.  Acute diverticulitis sigmoid with possible focal contained perforation versus abscess -Followed by surgery and no plans for acute surgical intervention given the high risk pulmonary status from her pulmonary  hypertension. -Having some mild abdominal pain with diarrhea since yesterday. Continue current care with antibiotics with Cipro and Flagyl. Continue supportive care with a soft diet for now. If continues to have symptoms would consider doing a repeat CT scan of the abdomen and pelvis tomorrow. Discussed with general surgery. - afebrile, WBC count stable.   2.  Chronic interstitial pulmonary fibrosis with pulmonary hypertension -Continue prednisone, Tyvaso and adcirca (home meds) -Oxygen, nebulizer as needed. Pt. Follows at [facility].   3.  Leukocytosis - due to # 1 and now resolved.   4.  History of chronic A. Fib -  Rate controlled. Cont. Sotalol.  - cont. Eliquis.   5.  Hypothyroidism - continue Synthroid  Case discussed with Care Management/Social Worker. Management plans discussed with the patient, family and they are in agreement.  CODE STATUS: Full  DVT Prophylaxis: Oral anticoagulation with Eliquis  TOTAL TIME TAKING CARE OF THIS PATIENT: 25 minutes.   POSSIBLE D/C IN few DAYS, DEPENDING ON CLINICAL CONDITION.  Note: This dictation was prepared with Dragon dictation along with smaller phrase technology. Any transcriptional errors that result from this process are unintentional.  Henreitta Leber M.D on 08/14/2017 at 1:48 PM  Between 7am to 6pm - Pager - [phone].   After 6pm go to [email or web address] - password [email or web address] [facility]  Office  [phone]  CC: Primary care physician; Glendon Axe, MD

## 2017-08-15 LAB — BASIC METABOLIC PANEL
ANION GAP: 6 (ref 5–15)
BUN: 8 mg/dL (ref 6–20)
CALCIUM: 8.1 mg/dL — AB (ref 8.9–10.3)
CO2: 23 mmol/L (ref 22–32)
CREATININE: 0.91 mg/dL (ref 0.44–1.00)
Chloride: 109 mmol/L (ref 101–111)
GFR calc non Af Amer: 59 mL/min — ABNORMAL LOW (ref >60)
GLUCOSE: 104 mg/dL — AB (ref 65–99)
Potassium: 3.7 mmol/L (ref 3.5–5.1)
Sodium: 138 mmol/L (ref 135–145)

## 2017-08-15 MED ORDER — ENSURE ENLIVE PO LIQD
237.0000 mL | Freq: Two times a day (BID) | ORAL | Status: DC
  Administered 2017-08-16: 237 mL via ORAL

## 2017-08-15 NOTE — Progress Notes (Signed)
08/15/2017  Subjective: Patient feeling better today.  Had a better night and tolerating soft diet.  Having diarrhea still, but not as severe.  Started on probiotics yesterday.  Vital signs: Temp:  [98.5 F (36.9 C)-99.2 F (37.3 C)] 99.2 F (37.3 C) (11/15 0509) Pulse Rate:  [62-109] 109 (11/15 1014) Resp:  [16-20] 18 (11/15 0851) BP: (107-136)/(54-62) 111/56 (11/15 1014) SpO2:  [94 %-98 %] 96 % (11/15 0851)   Intake/Output: 11/14 0701 - 11/15 0700 In: 1947.7 [P.O.:240; I.V.:1707.7] Out: 1700 [Urine:1700] Last BM Date: 08/15/17  Physical Exam: Constitutional: No acute distress Abdomen: Soft, nondistended, nontender to palpation.  Labs:  Recent Labs    08/13/17 0549 08/14/17 0601  WBC 9.8 9.5  HGB 11.8* 11.5*  HCT 35.8 34.9*  PLT 241 238   Recent Labs    08/14/17 0601 08/15/17 0538  NA 135 138  K 3.2* 3.7  CL 107 109  CO2 21* 23  GLUCOSE 102* 104*  BUN 10 8  CREATININE 0.79 0.91  CALCIUM 8.0* 8.1*   No results for input(s): LABPROT, INR in the last 72 hours.  Imaging: No results found.  Assessment/Plan: 76 year old female with acute diverticulitis with small abscess.  -Patient may be discharged to home today from the surgical standpoint. - She should get 10 more days of oral ciprofloxacin and Flagyl. - She can take probiotics as well to help with her diarrhea. - Follow-up with [place] over the next 2 weeks to make sure she is doing well.   Melvyn Neth, [place]

## 2017-08-15 NOTE — Care Management Important Message (Signed)
Important Message  Patient Details  Name: Julie Mcmillan MRN: [id number] Date of Birth: 1941-01-10   Medicare Important Message Given:  Yes    Beverly Sessions, RN 08/15/2017, 11:36 AM

## 2017-08-15 NOTE — Progress Notes (Signed)
[facility]mill[facility] NAME: Julie Mcmillan    MR#:  [id number]  DATE OF BIRTH:  [birth year]  SUBJECTIVE: shedoing better than yesterday, tolerating food.  However has tachycardia with heart rate up to 109 bpm and she also feels palpitations.  No abdominal pain, still has diarrhea, tolerating the diet, able to ambulate.  REVIEW OF SYSTEMS:   Review of Systems  Constitutional: Negative for chills, fever and weight loss.  HENT: Negative for ear discharge, ear pain and nosebleeds.   Eyes: Negative for blurred vision, pain and discharge.  Respiratory: Negative for sputum production, shortness of breath, wheezing and stridor.   Cardiovascular: Negative for chest pain, palpitations, orthopnea and PND.  Gastrointestinal: Positive for abdominal pain and diarrhea. Negative for nausea and vomiting.  Genitourinary: Negative for frequency and urgency.  Musculoskeletal: Negative for back pain and joint pain.  Neurological: Negative for sensory change, speech change and focal weakness.  Psychiatric/Behavioral: Negative for depression and hallucinations. The patient is not nervous/anxious.    Tolerating Diet: Soft diet Tolerating PT: not needd  DRUG ALLERGIES:   Allergies  Allergen Reactions  . Amlodipine Swelling  . Nexium [Esomeprazole Magnesium] Diarrhea  . Aspartame Other (See Comments)    Patient reports "self diagnosed intolerance" to artificial sweeteners.    VITALS:  Blood pressure (!) 111/56, pulse (!) 109, temperature 99.2 F (37.3 C), temperature source Oral, resp. rate 18, height _0  (1.626 m), weight 70.1 kg (154 lb 8 oz), SpO2 96 %.  PHYSICAL EXAMINATION:   Physical Exam  GENERAL:  76 y.o.-year-old patient lying in the bed in no acute distress.  EYES: Pupils equal, round, reactive to light and accommodation. No scleral icterus. Extraocular muscles intact.  HEENT: Head atraumatic, normocephalic. Oropharynx and nasopharynx  clear.  NECK:  Supple, no jugular venous distention. No thyroid enlargement, no tenderness.  LUNGS: Normal breath sounds bilaterally, no wheezing, rales, rhonchi. No use of accessory muscles of respiration.  CARDIOVASCULAR: S1, S2 tachycardic no murmurs, rubs, or gallops.  ABDOMEN: Soft, tender in RLQ, but no rebound, rigidity, nondistended. Bowel sounds present. No organomegaly or mass.  EXTREMITIES: No cyanosis, clubbing or edema b/l.    NEUROLOGIC: Cranial nerves II through XII are intact. No focal Motor or sensory deficits b/l.   PSYCHIATRIC:  patient is alert and oriented x 3.  SKIN: No obvious rash, lesion, or ulcer.   LABORATORY PANEL:  CBC Recent Labs  Lab 08/14/17 0601  WBC 9.5  HGB 11.5*  HCT 34.9*  PLT 238    Chemistries  Recent Labs  Lab 08/11/17 1942  08/15/17 0538  NA 138   < > 138  K 4.2   < > 3.7  CL 110   < > 109  CO2 20*   < > 23  GLUCOSE 111*   < > 104*  BUN 20   < > 8  CREATININE 0.99   < > 0.91  CALCIUM 9.1   < > 8.1*  AST 21  --   --   ALT 13*  --   --   ALKPHOS 71  --   --   BILITOT 0.7  --   --    < > = values in this interval not displayed.   Cardiac Enzymes No results for input(s): TROPONINI in the last 168 hours. RADIOLOGY:  No results found. ASSESSMENT AND PLAN:   Shamela Haydon  is a 76 y.o. female with a known history of atrial fibrillation,  diverticulitis, GERD, hypertension, pulmonary fibrosis, pulmonary hypertension, type 2 diabetes mellitus presented to the emergency room with abdominal pain and diarrhea for the last 2 days. Diarrhea is watery stool. The abdominal pain is aching in nature 7 out of 10 on a scale of 1-10. Patient was worked up with CT abdomen which showed diverticulitis and abscess  1.  Acute diverticulitis sigmoid with possible focal contained perforation versus abscess -Followed by surgery and no plans for acute surgical intervention given the high risk pulmonary status from her pulmonary hypertension. -H abdominal  pain improved but still has diarrhea.  Tolerating the diet, surgery recommends continuing Cipro, Flagyl for 10 days.  Likely discharge home tomorrow with oral antibiotics.  DC IV fluids today.. 2.  Chronic interstitial pulmonary fibrosis with pulmonary hypertension -Continue prednisone, Tyvaso and adcirca (home meds) -Oxygen, nebulizer as needed. Pt. Follows at [facility].   3.  Leukocytosis - due to # 1 and now resolved.   4.  History of chronic A. Fib -heart rate slightly high at 109 bpm, give morning dose of sotalol, continue Eliquis, likely discharge home tomorrow. 5.  Hypothyroidism - continue Synthroid #6 essential hypertension: Controlled 7.  Hypothyroidism: Continue levothyroxine 50 mcg p.o. Daily.  Case discussed with Care Management/Social Worker. Management plans discussed with the patient, family and they are in agreement.  CODE STATUS: Full  DVT Prophylaxis: Oral anticoagulation with Eliquis  TOTAL TIME TAKING CARE OF THIS PATIENT: 25 minutes.   POSSIBLE D/C IN few DAYS, DEPENDING ON CLINICAL CONDITION.  Note: This dictation was prepared with Dragon dictation along with smaller phrase technology. Any transcriptional errors that result from this process are unintentional.  Epifanio Lesches M.D on 08/15/2017 at 10:38 AM  Between 7am to 6pm - Pager - [phone].   After 6pm go to [email or web address] - password [email or web address] [email or web address]  Office  [phone]  CC: Primary care physician; Glendon Axe, MD

## 2017-08-16 MED ORDER — ENSURE ENLIVE PO LIQD: 237.0000 mL | Freq: Two times a day (BID) | ORAL | 12 refills | Status: DC

## 2017-08-16 MED ORDER — CIPROFLOXACIN HCL 500 MG PO TABS: 500.0000 mg | ORAL_TABLET | Freq: Two times a day (BID) | ORAL | 0 refills | Status: DC

## 2017-08-16 MED ORDER — METRONIDAZOLE 500 MG PO TABS: 500.0000 mg | ORAL_TABLET | Freq: Three times a day (TID) | ORAL | 0 refills | Status: AC

## 2017-08-16 NOTE — Discharge Summary (Signed)
Julie Mcmillan, is a 76 y.o. female  DOB 08/01/1941  MRN [id number].  Admission date:  08/11/2017  Admitting Physician  Saundra Shelling, MD  Discharge Date:  08/16/2017   Primary MD  Glendon Axe, MD  Recommendations for primary care physician for things to follow:   Follow-up with PCP in 1 week Follow-up with Dr. Hampton Abbot (Surgeon)in 2 weeks.   Admission Diagnosis  Nausea [R11.0] Lower abdominal pain [R10.30] Colonic diverticular abscess [K57.20] Acute diverticulitis [K57.92] Diarrhea, unspecified type [R19.7]   Discharge Diagnosis  Nausea [R11.0] Lower abdominal pain [R10.30] Colonic diverticular abscess [K57.20] Acute diverticulitis [K57.92] Diarrhea, unspecified type [R19.7]    Active Problems:   Acute diverticulitis   Colonic diverticular abscess      Past Medical History:  Diagnosis Date  . A-fib ([place])   . Chronic diarrhea   . Diverticulitis   . GERD (gastroesophageal reflux disease)    usually takes protonix  . Hip fracture ([place]) 11/2015   hairline fracture on right.  no surgery, just physical therapy  . Hypertension   . IBS (irritable bowel syndrome)   . IBS (irritable bowel syndrome)   . Interstitial lung disease ([place])    does not remember when  . Pulmonary fibrosis ([place])   . Pulmonary hypertension ([place])   . Seasonal affective disorder ([place])   . Shortness of breath dyspnea    with exertion  . Type 2 diabetes mellitus ([place]) 11/04/2014    Past Surgical History:  Procedure Laterality Date  . ABDOMINAL HYSTERECTOMY     partial  . APPENDECTOMY    . CATARACT EXTRACTION W/PHACO Right 03/16/2015   Procedure: CATARACT EXTRACTION PHACO AND INTRAOCULAR LENS PLACEMENT (IOC);  Surgeon: Leandrew Koyanagi, MD;  Location: [facility];  Service: Ophthalmology;  Laterality: Right;  . CATARACT  EXTRACTION W/PHACO Left 06/13/2016   Procedure: CATARACT EXTRACTION PHACO AND INTRAOCULAR LENS PLACEMENT (IOC);  Surgeon: Leandrew Koyanagi, MD;  Location: [facility];  Service: Ophthalmology;  Laterality: Left;  pre-diabetic - diet controlled  . COLONOSCOPY    . COLONOSCOPY WITH PROPOFOL N/A 05/25/2016   Procedure: COLONOSCOPY WITH PROPOFOL;  Surgeon: Manya Silvas, MD;  Location[facility] END[facility]COPY;  Service: Endoscopy;  Laterality: N/A;  . ESOPHAGOGASTRODUODENOSCOPY (EGD) WITH PROPOFOL N/A 05/25/2016   Procedure: ESOPHAGOGASTRODUODENOSCOPY (EGD) WITH PROPOFOL;  Surgeon: Manya Silvas, MD;  Location: [facility] ENDOSCOPY;  Service: Endoscopy;  Laterality: N/A;  . NISSEN FUNDOPLICATION  3220   came apart in 2016 and ibs and reflux returned  . OPEN REDUCTION INTERNAL FIXATION (ORIF) DISTAL RADIAL FRACTURE Right 01/13/2016   Procedure: OPEN REDUCTION INTERNAL FIXATION (ORIF) DISTAL RADIAL FRACTURE;  Surgeon: Hessie Knows, MD;  Location: [facility] ORS;  Service: Orthopedics;  Laterality: Right;  . TONSILLECTOMY     and adenoidectomy       History of present illness and  Hospital Course:     Kindly see H&P for history of present illness and admission details, please review complete Labs, Consult reports and Test reports for all details in brief  HPI  from the history and physical done on the day of admission 76 year old female patient with history of atrial fibrillation, diverticulitis, GERD, essential hypertension, pulmonary fibrosis, pulmonary hypertension, diabetes mellitus type 2 came because of abdominal pain, diarrhea for 2 days, patient had abdominal pain 7 out of 10 severity and CAT scan of abdomen showed diverticulitis and abscess: Surgery consult obtained,    Hospital Course  #1. acute abdominal pain, diarrhea secondary to sigmoid colon diverticulosis, small pocket  of abscess around sigmoid colon area.  Patient received IV Cipro, Flagyl, seen by surgery, initially she was n.p.o.,  diarrhea is negative for C. difficile.  Started on liquid diet, patient abdominal pain improved, she tolerated soft food, seen by surgery, patient did not require any abscess drainage.  Symptoms of abdominal pain, diarrhea improved with symptomatic treatment and antibiotics.  Dr. Hampton Abbot from surgery recommended 10 days of Cipro, Flagyl, he will be seeing the patient in 2 weeks in his clinic.  Patient can continue probiotics .  #2 atrial fibrillation, controlled rate, patient is on sotalol, metoprolol, Eliquis, patient is on sotalol, metoprolol as per cardiology recommendation. 3 evidence of pulmonary hypertension, interstitial lung disease: Patient is on pulmonary vasodilators, follows up at [facility], patient is on Adcirca,Tyvaso, chronic prednisone #4 anxiety, depression; continue Prozac 5.  GERD: Continue PPIs  6.  Hypothyroidism: Continue levothyroxine Impaired glucose tolerance Osteoporosis 7 essential hypertension: Controlled, continue losartan, Lasix.  Discharge Condition: Stable   Follow UP  Follow-up Information    Glendon Axe, MD Follow up in 1 week(s).   Specialty:  Internal Medicine Contact information: [place] [address] [phone]        Olean Ree, MD. Schedule an appointment as soon as possible for a visit in 2 day(s).   Specialty:  Surgery Contact information: [place] [address] [phone]             Discharge Instructions  and  Discharge Medications      Allergies as of 08/16/2017      Reactions   Amlodipine Swelling   Nexium [esomeprazole Magnesium] Diarrhea   Aspartame Other (See Comments)   Patient reports "self diagnosed intolerance" to artificial sweeteners.      Medication List    TAKE these medications   ADCIRCA 20 MG tablet Generic drug:  tadalafil (PAH) Take 2 tablets daily by mouth.   CALCIUM 600+D 600-200 MG-UNIT Tabs Generic drug:  Calcium  Carbonate-Vitamin D Take 1 tablet by mouth daily.   cholestyramine 4 GM/DOSE powder Commonly known as:  QUESTRAN Take 4 g by mouth every morning.   ciprofloxacin 500 MG tablet Commonly known as:  CIPRO Take 1 tablet (500 mg total) 2 (two) times daily by mouth.   ELIQUIS 5 MG Tabs tablet Generic drug:  apixaban Take 5 mg by mouth 2 (two) times daily.   feeding supplement (ENSURE ENLIVE) Liqd Take 237 mLs 2 (two) times daily between meals by mouth.   FLUoxetine 20 MG capsule Commonly known as:  PROZAC Take 20 mg by mouth daily.   furosemide 20 MG tablet Commonly known as:  LASIX Take 1 tablet daily by mouth.   hydrALAZINE 25 MG tablet Commonly known as:  APRESOLINE Take 25 mg by mouth 3 (three) times daily.   levothyroxine 50 MCG tablet Commonly known as:  SYNTHROID, LEVOTHROID Take 50 mcg by mouth daily before breakfast.   losartan 100 MG tablet Commonly known as:  COZAAR Take 100 mg by mouth daily.   metoprolol succinate 50 MG 24 hr tablet Commonly known as:  TOPROL-XL Take 50 mg by mouth daily.   metroNIDAZOLE 500 MG tablet Commonly known as:  FLAGYL Take 1 tablet (500 mg total) 3 (three) times daily for 7 days by mouth.   pantoprazole 40 MG tablet Commonly known as:  PROTONIX Take 1 tablet (40 mg total) by mouth daily.   predniSONE 10 MG tablet Commonly known as:  DELTASONE Take  10 mg by mouth daily.   ranitidine 300 MG tablet Commonly known as:  ZANTAC Take 300 mg by mouth at bedtime.   sotalol 80 MG tablet Commonly known as:  BETAPACE Take 1 tablet (80 mg total) by mouth every 12 (twelve) hours.   TYVASO 0.6 MG/ML Soln Generic drug:  Treprostinil Inhale 36 mcg 4 (four) times daily into the lungs. (6 breaths total)         Diet and Activity recommendation: See Discharge Instructions above   Consults obtained -surgery   Major procedures and Radiology Reports - PLEASE review detailed and final reports for all details, in brief -       Ct Abdomen Pelvis W Contrast  Result Date: 08/12/2017 CLINICAL DATA:  76 year old female with abdominal pain. Concern for diverticulitis. EXAM: CT ABDOMEN AND PELVIS WITH CONTRAST TECHNIQUE: Multidetector CT imaging of the abdomen and pelvis was performed using the standard protocol following bolus administration of intravenous contrast. CONTRAST:  152m ISOVUE-300 IOPAMIDOL (ISOVUE-300) INJECTION 61% COMPARISON:  Abdominal MRI dated 09/14/2016 and CT dated 08/31/2016 and 11/25/2015. FINDINGS: Lower chest: There is fibrosis and subpleural honeycombing reflecting interstitial lung disease. No focal consolidation in the visualized lung bases. There is coronary vascular calcification. There is dilatation of the main pulmonary trunk indicative of underlying pulmonary hypertension. No intra-abdominal free air or free fluid. Hepatobiliary: The liver is unremarkable. There is postsurgical changes of cholecystectomy. There is mild dilatation of the common bile duct measuring up to 12 mm, likely post cholecystectomy changes. Pancreas: There is a 16 mm cystic lesion at the head of the pancreas as seen on the prior CT and MRI. Spleen: Normal in size without focal abnormality. Adrenals/Urinary Tract: The adrenal glands are unremarkable. Mild bilateral renal cortical scarring primarily involving the upper poles, and left more prominent than right. There is no hydronephrosis on either side. There is uniform excretion and enhancement of both kidneys. The visualized ureters and urinary bladder appear unremarkable. Stomach/Bowel: There is extensive sigmoid diverticulosis with muscular hypertrophy. Thickened appearance of the wall of the sigmoid colon, likely chronic changes, however superimposed acute diverticulitis is not excluded. There is a 2.0 x 2.5 x 2.6 cm pocket/collection containing air and fluid anterior to the sigmoid colon and superior to the bladder as seen on the prior CT. This most likely represents a  focally contained diverticular perforation or abscess. There is loss of fat plane between this collection and bladder wall. Although no air is noted within the urinary bladder, an early rectovesical fistula is not excluded. There is a moderate size hiatal hernia. There is abutment of multiple loops of small bowel in the left upper abdomen (series 2, image 52 and coronal series 5, image 27) compatible with adhesions. No bowel obstruction. The appendix is not visualized with certainty. No inflammatory changes identified in the right lower quadrant. Vascular/Lymphatic: Mild aortoiliac atherosclerotic disease. The abdominal aorta and IVC are otherwise unremarkable. No portal venous gas. There is no adenopathy. Reproductive: Hysterectomy. Other: None Musculoskeletal: Osteopenia. There is T8 compression fracture as seen on the prior CT. There is near complete loss of vertebral body height centrally. Mild buckling of the posterior cortex without retropulsion. No acute fracture. Avascular necrosis of the right femoral head. IMPRESSION: 1. Extensive sigmoid diverticulosis with chronic changes and muscular hypertrophy. An acute on chronic diverticulitis is not excluded. Correlation with clinical exam recommended. 2. A small pocket/collection containing air and fluid anterior to the sigmoid and superior to the bladder likely represents a diverticular abscess or focally  contained perforation. There is loss of fat plane between this collection and the bladder wall. No air is seen within the bladder. This however can predispose to a rectovesical fistula. 3. A 16 mm cystic lesion in the head of the pancreas as seen on the prior CT and MRI. 4. Fibrotic changes of the lung bases with findings of interstitial lung disease. Electronically Signed   By: Anner Crete M.D.   On: 08/12/2017 02:13    Micro Results     Recent Results (from the past 240 hour(s))  Gastrointestinal Panel by PCR , Stool     Status: None   Collection  Time: 08/12/17  2:04 AM  Result Value Ref Range Status   Campylobacter species NOT DETECTED NOT DETECTED Final   Plesimonas shigelloides NOT DETECTED NOT DETECTED Final   Salmonella species NOT DETECTED NOT DETECTED Final   Yersinia enterocolitica NOT DETECTED NOT DETECTED Final   Vibrio species NOT DETECTED NOT DETECTED Final   Vibrio cholerae NOT DETECTED NOT DETECTED Final   Enteroaggregative E coli (EAEC) NOT DETECTED NOT DETECTED Final   Enteropathogenic E coli (EPEC) NOT DETECTED NOT DETECTED Final   Enterotoxigenic E coli (ETEC) NOT DETECTED NOT DETECTED Final   Shiga like toxin producing E coli (STEC) NOT DETECTED NOT DETECTED Final   Shigella/Enteroinvasive E coli (EIEC) NOT DETECTED NOT DETECTED Final   Cryptosporidium NOT DETECTED NOT DETECTED Final   Cyclospora cayetanensis NOT DETECTED NOT DETECTED Final   Entamoeba histolytica NOT DETECTED NOT DETECTED Final   Giardia lamblia NOT DETECTED NOT DETECTED Final   Adenovirus F40/41 NOT DETECTED NOT DETECTED Final   Astrovirus NOT DETECTED NOT DETECTED Final   Norovirus GI/GII NOT DETECTED NOT DETECTED Final   Rotavirus A NOT DETECTED NOT DETECTED Final   Sapovirus (I, II, IV, and V) NOT DETECTED NOT DETECTED Final  C difficile quick scan w PCR reflex     Status: None   Collection Time: 08/12/17  2:04 AM  Result Value Ref Range Status   C Diff antigen NEGATIVE NEGATIVE Final   C Diff toxin NEGATIVE NEGATIVE Final   C Diff interpretation No C. difficile detected.  Final       Today   Subjective:   Julie Mcmillan today has no headache,no chest abdominal pain,no new weakness tingling or numbness, feels much better wants to go home today.   Objective:   Blood pressure (!) 119/49, pulse 61, temperature 98.4 F (36.9 C), temperature source Oral, resp. rate 19, height _0  (1.626 m), weight 70.1 kg (154 lb 8 oz), SpO2 99 %.   Intake/Output Summary (Last 24 hours) at 08/16/2017 0857 Last data filed at 08/16/2017  0543 Gross per 24 hour  Intake 866.25 ml  Output 1100 ml  Net -233.75 ml    Exam Awake Alert, Oriented x 3, No new F.N deficits, Normal affect .AT,PERRAL Supple Neck,No JVD, No cervical lymphadenopathy appriciated.  Symmetrical Chest wall movement, Good air movement bilaterally, CTAB RRR,No Gallops,Rubs or new Murmurs, No Parasternal Heave +ve B.Sounds, Abd Soft, Non tender, No organomegaly appriciated, No rebound -guarding or rigidity. No Cyanosis, Clubbing or edema, No new Rash or bruise  Data Review   CBC w Diff:  Lab Results  Component Value Date   WBC 9.5 08/14/2017   HGB 11.5 (L) 08/14/2017   HCT 34.9 (L) 08/14/2017   PLT 238 08/14/2017   LYMPHOPCT 16 08/13/2017   MONOPCT 16 08/13/2017   EOSPCT 1 08/13/2017   BASOPCT 1 08/13/2017  CMP:  Lab Results  Component Value Date   NA 138 08/15/2017   K 3.7 08/15/2017   CL 109 08/15/2017   CO2 23 08/15/2017   BUN 8 08/15/2017   CREATININE 0.91 08/15/2017   PROT 6.6 08/11/2017   ALBUMIN 3.9 08/11/2017   BILITOT 0.7 08/11/2017   ALKPHOS 71 08/11/2017   AST 21 08/11/2017   ALT 13 (L) 08/11/2017  .   Total Time in preparing paper work, data evaluation and todays exam - 35 minutes  Epifanio Lesches M.D on 08/16/2017 at 8:57 AM    Note: This dictation was prepared with Dragon dictation along with smaller phrase technology. Any transcriptional errors that result from this process are unintentional.

## 2017-08-16 NOTE — Progress Notes (Signed)
Discharge teaching completed, patient asked if she had any questions and she denied that she had questions. Patient verbalized that all belongings were collected and her son will put them in the bag when he arrives.

## 2017-08-21 ENCOUNTER — Inpatient Hospital Stay: Payer: Medicare HMO | Admitting: Surgery

## 2018-02-09 ENCOUNTER — Emergency Department: Payer: Medicare HMO

## 2018-02-09 ENCOUNTER — Other Ambulatory Visit: Payer: Self-pay

## 2018-02-09 ENCOUNTER — Encounter: Payer: Self-pay | Admitting: Emergency Medicine

## 2018-02-09 ENCOUNTER — Inpatient Hospital Stay
Admission: EM | Admit: 2018-02-09 | Discharge: 2018-02-12 | DRG: 699 | Disposition: A | Discharge status: Home / Self Care | Payer: Medicare HMO | Attending: Internal Medicine | Admitting: Internal Medicine

## 2018-02-09 DIAGNOSIS — R42 Dizziness and giddiness: Secondary | ICD-10-CM | POA: Diagnosis present

## 2018-02-09 DIAGNOSIS — B9620 Unspecified Escherichia coli [E. coli] as the cause of diseases classified elsewhere: Secondary | ICD-10-CM | POA: Diagnosis present

## 2018-02-09 DIAGNOSIS — G4733 Obstructive sleep apnea (adult) (pediatric): Secondary | ICD-10-CM | POA: Diagnosis present

## 2018-02-09 DIAGNOSIS — I10 Essential (primary) hypertension: Secondary | ICD-10-CM | POA: Diagnosis present

## 2018-02-09 DIAGNOSIS — K219 Gastro-esophageal reflux disease without esophagitis: Secondary | ICD-10-CM | POA: Diagnosis present

## 2018-02-09 DIAGNOSIS — I272 Pulmonary hypertension, unspecified: Secondary | ICD-10-CM | POA: Diagnosis present

## 2018-02-09 DIAGNOSIS — Z9842 Cataract extraction status, left eye: Secondary | ICD-10-CM

## 2018-02-09 DIAGNOSIS — Z9841 Cataract extraction status, right eye: Secondary | ICD-10-CM | POA: Diagnosis not present

## 2018-02-09 DIAGNOSIS — I480 Paroxysmal atrial fibrillation: Secondary | ICD-10-CM | POA: Diagnosis present

## 2018-02-09 DIAGNOSIS — Z833 Family history of diabetes mellitus: Secondary | ICD-10-CM | POA: Diagnosis not present

## 2018-02-09 DIAGNOSIS — Z79899 Other long term (current) drug therapy: Secondary | ICD-10-CM | POA: Diagnosis not present

## 2018-02-09 DIAGNOSIS — E119 Type 2 diabetes mellitus without complications: Secondary | ICD-10-CM | POA: Diagnosis present

## 2018-02-09 DIAGNOSIS — Z9981 Dependence on supplemental oxygen: Secondary | ICD-10-CM

## 2018-02-09 DIAGNOSIS — E039 Hypothyroidism, unspecified: Secondary | ICD-10-CM | POA: Diagnosis present

## 2018-02-09 DIAGNOSIS — N3001 Acute cystitis with hematuria: Secondary | ICD-10-CM | POA: Diagnosis not present

## 2018-02-09 DIAGNOSIS — Z7952 Long term (current) use of systemic steroids: Secondary | ICD-10-CM | POA: Diagnosis not present

## 2018-02-09 DIAGNOSIS — K449 Diaphragmatic hernia without obstruction or gangrene: Secondary | ICD-10-CM | POA: Diagnosis present

## 2018-02-09 DIAGNOSIS — Z701 Counseling related to patient's sexual behavior and orientation: Secondary | ICD-10-CM | POA: Diagnosis not present

## 2018-02-09 DIAGNOSIS — Z961 Presence of intraocular lens: Secondary | ICD-10-CM | POA: Diagnosis present

## 2018-02-09 DIAGNOSIS — N390 Urinary tract infection, site not specified: Secondary | ICD-10-CM | POA: Diagnosis not present

## 2018-02-09 DIAGNOSIS — I482 Chronic atrial fibrillation: Secondary | ICD-10-CM | POA: Diagnosis present

## 2018-02-09 DIAGNOSIS — K589 Irritable bowel syndrome without diarrhea: Secondary | ICD-10-CM | POA: Diagnosis present

## 2018-02-09 DIAGNOSIS — J8410 Pulmonary fibrosis, unspecified: Secondary | ICD-10-CM | POA: Diagnosis present

## 2018-02-09 DIAGNOSIS — N321 Vesicointestinal fistula: Secondary | ICD-10-CM | POA: Diagnosis present

## 2018-02-09 DIAGNOSIS — Z79890 Hormone replacement therapy (postmenopausal): Secondary | ICD-10-CM

## 2018-02-09 DIAGNOSIS — K5730 Diverticulosis of large intestine without perforation or abscess without bleeding: Secondary | ICD-10-CM | POA: Diagnosis present

## 2018-02-09 DIAGNOSIS — F39 Unspecified mood [affective] disorder: Secondary | ICD-10-CM | POA: Diagnosis present

## 2018-02-09 DIAGNOSIS — Z888 Allergy status to other drugs, medicaments and biological substances status: Secondary | ICD-10-CM

## 2018-02-09 LAB — CBC WITH DIFFERENTIAL/PLATELET
BASOS ABS: 0 10*3/uL (ref 0–0.1)
BASOS PCT: 0 %
Band Neutrophils: 0 %
Blasts: 0 %
Eosinophils Absolute: 0.2 10*3/uL (ref 0–0.7)
Eosinophils Relative: 1 %
HCT: 37.1 % (ref 35.0–47.0)
HEMOGLOBIN: 12.9 g/dL (ref 12.0–16.0)
Lymphocytes Relative: 8 %
Lymphs Abs: 1.8 10*3/uL (ref 1.0–3.6)
MCH: 28.2 pg (ref 26.0–34.0)
MCHC: 34.8 g/dL (ref 32.0–36.0)
MCV: 80.9 fL (ref 80.0–100.0)
METAMYELOCYTES PCT: 0 %
MONO ABS: 1.3 10*3/uL — AB (ref 0.2–0.9)
MYELOCYTES: 0 %
Monocytes Relative: 6 %
NEUTROS PCT: 85 %
NRBC: 0 /100{WBCs}
Neutro Abs: 19 10*3/uL — ABNORMAL HIGH (ref 1.4–6.5)
Other: 0 %
Platelets: 282 10*3/uL (ref 150–440)
Promyelocytes Relative: 0 %
RBC: 4.58 MIL/uL (ref 3.80–5.20)
RDW: 14.5 % (ref 11.5–14.5)
WBC: 22.3 10*3/uL — AB (ref 3.6–11.0)

## 2018-02-09 LAB — URINALYSIS, COMPLETE (UACMP) WITH MICROSCOPIC
Bilirubin Urine: NEGATIVE
GLUCOSE, UA: NEGATIVE mg/dL
Ketones, ur: NEGATIVE mg/dL
Nitrite: NEGATIVE
PH: 5 (ref 5.0–8.0)
Protein, ur: 100 mg/dL — AB
RBC / HPF: >50 RBC/hpf — ABNORMAL HIGH (ref 0–5)
SPECIFIC GRAVITY, URINE: 1.024 (ref 1.005–1.030)
Squamous Epithelial / LPF: NONE SEEN (ref 0–5)

## 2018-02-09 LAB — LACTIC ACID, PLASMA
Lactic Acid, Venous: 2.1 mmol/L (ref 0.5–1.9)
Lactic Acid, Venous: 1.5 mmol/L (ref 0.5–1.9)

## 2018-02-09 LAB — COMPREHENSIVE METABOLIC PANEL
ALK PHOS: 55 U/L (ref 38–126)
ALT: 10 U/L — AB (ref 14–54)
AST: 22 U/L (ref 15–41)
Albumin: 3.7 g/dL (ref 3.5–5.0)
Anion gap: 7 (ref 5–15)
BUN: 21 mg/dL — ABNORMAL HIGH (ref 6–20)
CALCIUM: 8.6 mg/dL — AB (ref 8.9–10.3)
CO2: 21 mmol/L — ABNORMAL LOW (ref 22–32)
CREATININE: 1.01 mg/dL — AB (ref 0.44–1.00)
Chloride: 105 mmol/L (ref 101–111)
GFR, EST NON AFRICAN AMERICAN: 52 mL/min — AB (ref >60)
Glucose, Bld: 136 mg/dL — ABNORMAL HIGH (ref 65–99)
Potassium: 4.3 mmol/L (ref 3.5–5.1)
Sodium: 133 mmol/L — ABNORMAL LOW (ref 135–145)
Total Bilirubin: 0.7 mg/dL (ref 0.3–1.2)
Total Protein: 6.1 g/dL — ABNORMAL LOW (ref 6.5–8.1)

## 2018-02-09 MED ORDER — SODIUM CHLORIDE 0.9 % IV SOLN
1.0000 g | Freq: Once | INTRAVENOUS | Status: AC
  Administered 2018-02-09: 1 g via INTRAVENOUS
  Filled 2018-02-09: qty 10

## 2018-02-09 MED ORDER — IOPAMIDOL (ISOVUE-370) INJECTION 76%
75.0000 mL | Freq: Once | INTRAVENOUS | Status: AC | PRN
  Administered 2018-02-09: 75 mL via INTRAVENOUS

## 2018-02-09 MED ORDER — SODIUM CHLORIDE 0.9 % IV BOLUS
1000.0000 mL | Freq: Once | INTRAVENOUS | Status: AC
  Administered 2018-02-09: 1000 mL via INTRAVENOUS

## 2018-02-09 MED ORDER — SODIUM CHLORIDE 0.9 % IV BOLUS
1000.0000 mL | Freq: Once | INTRAVENOUS | Status: AC
Start: 2018-02-09 — End: 2018-02-09
  Administered 2018-02-09: 1000 mL via INTRAVENOUS

## 2018-02-09 NOTE — H&P (Signed)
[facility] NAME: Julie Mcmillan    MR#:  [id number]  DATE OF BIRTH:  01-23-1941  DATE OF ADMISSION:  02/09/2018  PRIMARY CARE PHYSICIAN: Glendon Axe, MD   REQUESTING/REFERRING PHYSICIAN:   CHIEF COMPLAINT:   Chief Complaint  Patient presents with  . Fever  . Dysuria    HISTORY OF PRESENT ILLNESS: Julie Mcmillan  is a 77 y.o. female with a known history of A. fib, irritable bowel syndrome, diverticulitis, hypertension, interstitial lung disease on 2 L continuous home oxygen, pulmonary hypertension type 2 diabetes. She presented to emergency room for fever, painful urination and right lower abdominal pain, going on for the past 8 hours.  The right lower abdominal pain is intermittent, crampy in nature, 8 out of 10 in severity.  Patient mentioned that she has noticed that she has been passing gas in her urine for the past 3 to 4 weeks.  She has been also dealing with a vertigo episode, going on for the past week, slowly improving with Epley maneuvers, that she has been doing at home. Blood test done emergency room are notable for creatinine level of 1.01, WBC at 22,000 and lactic acid level at 2.1. UTI is positive for UA. CT of the abdomen and pelvis, reviewed by myself, shows diffuse bladder wall thickening and perivesical inflammation suspicious for infection/cystitis. 2/2.5 cm fluid/gas collection between the sigmoid colon and bladder and unchanged in size from 08/31/2016, probably representing chronic collection/diverticulum/sinus tract. Patient is admitted for further evaluation and treatment.  PAST MEDICAL HISTORY:   Past Medical History:  Diagnosis Date  . A-fib ([place])   . Chronic diarrhea   . Diverticulitis   . GERD (gastroesophageal reflux disease)    usually takes protonix  . Hip fracture ([place]) 11/2015   hairline fracture on right.  no surgery, just physical therapy  . Hypertension   . IBS (irritable bowel  syndrome)   . IBS (irritable bowel syndrome)   . Interstitial lung disease ([place])    does not remember when  . Pulmonary fibrosis ([place])   . Pulmonary hypertension ([place])   . Seasonal affective disorder ([place])   . Shortness of breath dyspnea    with exertion  . Type 2 diabetes mellitus ([place]) 11/04/2014    PAST SURGICAL HISTORY:  Past Surgical History:  Procedure Laterality Date  . ABDOMINAL HYSTERECTOMY     partial  . APPENDECTOMY    . CATARACT EXTRACTION W/PHACO Right 03/16/2015   Procedure: CATARACT EXTRACTION PHACO AND INTRAOCULAR LENS PLACEMENT (IOC);  Surgeon: Leandrew Koyanagi, MD;  Location: [facility];  Service: Ophthalmology;  Laterality: Right;  . CATARACT EXTRACTION W/PHACO Left 06/13/2016   Procedure: CATARACT EXTRACTION PHACO AND INTRAOCULAR LENS PLACEMENT (IOC);  Surgeon: Leandrew Koyanagi, MD;  Location: [facility];  Service: Ophthalmology;  Laterality: Left;  pre-diabetic - diet controlled  . COLONOSCOPY    . COLONOSCOPY WITH PROPOFOL N/A 05/25/2016   Procedure: COLONOSCOPY WITH PROPOFOL;  Surgeon: Manya Silvas, MD;  Location: [facility] ENDOSCOPY;  Service: Endoscopy;  Laterality: N/A;  . ESOPHAGOGASTRODUODENOSCOPY (EGD) WITH PROPOFOL N/A 05/25/2016   Procedure: ESOPHAGOGASTRODUODENOSCOPY (EGD) WITH PROPOFOL;  Surgeon: Manya Silvas, MD;  Location[facility] ENDOSCOPY;  Service: Endoscopy;  Laterality: N/A;  . NISSEN FUNDOPLICATION  [id number]   came apart in 2016 and ibs and reflux returned  . OPEN REDUCTION INTERNAL FIXATION (ORIF) DISTAL RADIAL FRACTURE Right 01/13/2016   Procedure: OPEN REDUCTION INTERNAL FIXATION (ORIF) DISTAL RADIAL FRACTURE;  Surgeon: Hessie Knows, MD;  Location: [facility] ORS;  Service: Orthopedics;  Laterality: Right;  . TONSILLECTOMY     and adenoidectomy    SOCIAL HISTORY:  Social History   Tobacco Use  . Smoking status: Never Smoker  . Smokeless tobacco: Never Used  . Tobacco comment: no passive smokers in home  Substance Use Topics   . Alcohol use: No    Alcohol/week: 0.0 oz    FAMILY HISTORY:  Family History  Problem Relation Age of Onset  . Breast cancer Mother   . Diabetes Mellitus II Mother   . Hypothyroidism Mother   . Atrial fibrillation Mother   . Heart attack Father   . Diabetes Maternal Grandmother   . Heart attack Maternal Grandfather     DRUG ALLERGIES:  Allergies  Allergen Reactions  . Amlodipine Swelling  . Nexium [Esomeprazole Magnesium] Diarrhea  . Aspartame Other (See Comments)    Patient reports "self diagnosed intolerance" to artificial sweeteners.    REVIEW OF SYSTEMS:   CONSTITUTIONAL: Positive for fever, fatigue and generalized weakness.  EYES: No blurred or double vision.  EARS, NOSE, AND THROAT: No tinnitus or ear pain.  RESPIRATORY: No cough, shortness of breath, wheezing or hemoptysis.  CARDIOVASCULAR: No chest pain, orthopnea, edema.  GASTROINTESTINAL: No nausea, vomiting, diarrhea.  Positive for right lower quadrant abdominal pain.  GENITOURINARY: Positive for dysuria, no hematuria.  ENDOCRINE: No polyuria, nocturia,  HEMATOLOGY: No bleeding SKIN: No rash or lesion. MUSCULOSKELETAL: No joint pain.   NEUROLOGIC: No focal weakness.  PSYCHIATRY: No anxiety or depression.   MEDICATIONS AT HOME:  Prior to Admission medications   Medication Sig Start Date End Date Taking? Authorizing Provider  apixaban (ELIQUIS) 5 MG TABS tablet Take 5 mg by mouth 2 (two) times daily.   Yes [provider]  Calcium Carbonate-Vitamin D (CALCIUM 600+D) 600-200 MG-UNIT TABS Take 1 tablet by mouth daily.   Yes [provider]  cholestyramine Lucrezia Starch) 4 GM/DOSE powder Take 4 g by mouth every morning.    Yes [provider]  FLUoxetine (PROZAC) 20 MG capsule Take 20 mg by mouth daily.   Yes [provider]  furosemide (LASIX) 20 MG tablet Take 1 tablet daily by mouth. 08/06/17 08/06/18 Yes [provider]  levothyroxine (SYNTHROID, LEVOTHROID) 50 MCG  tablet Take 50 mcg by mouth daily before breakfast.   Yes [provider]  losartan (COZAAR) 100 MG tablet Take 100 mg by mouth daily.    Yes [provider]  pantoprazole (PROTONIX) 40 MG tablet Take 1 tablet (40 mg total) by mouth daily. 03/29/15  Yes Glendon Axe, MD  predniSONE (DELTASONE) 10 MG tablet Take 10 mg by mouth daily. 01/13/17  Yes [provider]  ranitidine (ZANTAC) 300 MG tablet Take 300 mg by mouth at bedtime.    Yes [provider]  sotalol (BETAPACE) 80 MG tablet Take 1 tablet (80 mg total) by mouth every 12 (twelve) hours. 03/29/15  Yes Glendon Axe, MD  tadalafil, PAH, (ADCIRCA) 20 MG tablet Take 2 tablets daily by mouth. 03/25/17  Yes [provider]  Treprostinil (TYVASO) 0.6 MG/ML SOLN Inhale 36 mcg 4 (four) times daily into the lungs. (6 breaths total) 03/04/17 03/04/18 Yes [provider]  ciprofloxacin (CIPRO) 500 MG tablet Take 1 tablet (500 mg total) 2 (two) times daily by mouth. Patient not taking: Reported on 02/09/2018 08/16/17   Epifanio Lesches, MD  feeding supplement, ENSURE ENLIVE, (ENSURE ENLIVE) LIQD Take 237 mLs 2 (two) times daily  between meals by mouth. Patient not taking: Reported on 02/09/2018 08/16/17   Epifanio Lesches, MD      PHYSICAL EXAMINATION:   VITAL SIGNS: Blood pressure 108/75, pulse 87, temperature 99.5 F (37.5 C), temperature source Oral, resp. rate 18, height 5' 4" (1.626 m), weight 65.8 kg (145 lb), SpO2 100 %.  GENERAL:  77 y.o.-year-old patient lying in the bed with no acute distress.  She is on 2 L oxygen per nasal cannula. EYES: Pupils equal, round, reactive to light and accommodation. No scleral icterus. HEENT: Head atraumatic, normocephalic. Oropharynx and nasopharynx clear.  NECK:  Supple, no jugular venous distention. No thyroid enlargement, no tenderness.  LUNGS: Reduced breath sounds bilaterally, no wheezing, rales,rhonchi or crepitation. No use of accessory muscles  of respiration.  CARDIOVASCULAR: S1, S2 normal. No S3/S4.  ABDOMEN: There is tenderness in right lower quadrant with palpation, no guarding/rebound.  Otherwise, the abdomen is soft, nondistended. Bowel sounds present. No organomegaly or mass.  EXTREMITIES: No pedal edema, cyanosis, or clubbing.  NEUROLOGIC: No focal weakness. PSYCHIATRIC: The patient is alert and oriented x 3.  SKIN: No obvious rash, lesion, or ulcer.   LABORATORY PANEL:   CBC Recent Labs  Lab 02/09/18 1828  WBC 22.3*  HGB 12.9  HCT 37.1  PLT 282  MCV 80.9  MCH 28.2  MCHC 34.8  RDW 14.5  LYMPHSABS 1.8  MONOABS 1.3*  EOSABS 0.2  BASOSABS 0.0   ------------------------------------------------------------------------------------------------------------------  Chemistries  Recent Labs  Lab 02/09/18 1828  NA 133*  K 4.3  CL 105  CO2 21*  GLUCOSE 136*  BUN 21*  CREATININE 1.01*  CALCIUM 8.6*  AST 22  ALT 10*  ALKPHOS 55  BILITOT 0.7   ------------------------------------------------------------------------------------------------------------------ estimated creatinine clearance is 42.8 mL/min (A) (by C-G formula based on SCr of 1.01 mg/dL (H)). ------------------------------------------------------------------------------------------------------------------ No results for input(s): TSH, T4TOTAL, T3FREE, THYROIDAB in the last 72 hours.  Invalid input(s): FREET3   Coagulation profile No results for input(s): INR, PROTIME in the last 168 hours. ------------------------------------------------------------------------------------------------------------------- No results for input(s): DDIMER in the last 72 hours. -------------------------------------------------------------------------------------------------------------------  Cardiac Enzymes No results for input(s): CKMB, TROPONINI, MYOGLOBIN in the last 168 hours.  Invalid input(s):  CK ------------------------------------------------------------------------------------------------------------------ Invalid input(s): POCBNP  ---------------------------------------------------------------------------------------------------------------  Urinalysis    Component Value Date/Time   COLORURINE YELLOW (A) 02/09/2018 1832   APPEARANCEUR TURBID (A) 02/09/2018 1832   LABSPEC 1.024 02/09/2018 1832   PHURINE 5.0 02/09/2018 1832   GLUCOSEU NEGATIVE 02/09/2018 1832   HGBUR MODERATE (A) 02/09/2018 1832   BILIRUBINUR NEGATIVE 02/09/2018 1832   KETONESUR NEGATIVE 02/09/2018 1832   PROTEINUR 100 (A) 02/09/2018 1832   NITRITE NEGATIVE 02/09/2018 1832   LEUKOCYTESUR MODERATE (A) 02/09/2018 1832     RADIOLOGY: Ct Abdomen Pelvis W Contrast  Addendum Date: 02/09/2018   ADDENDUM REPORT: 02/09/2018 21:10 ADDENDUM: Patient describes a history of passing gas from the bladder. There is a tiny focus of gas within the bladder and this suggests a patent colovesical fistula. Impression should also read: Distended proximal small bowel loops, most likely ileus. Moderate to large hiatal hernia. Electronically Signed   By: Margarette Canada M.D.   On: 02/09/2018 21:10   Result Date: 02/09/2018 CLINICAL DATA:  77 year old female with acute abdominal and pelvic pain and fever. EXAM: CT ABDOMEN AND PELVIS WITH CONTRAST TECHNIQUE: Multidetector CT imaging of the abdomen and pelvis was performed using the standard protocol following bolus administration of intravenous contrast. CONTRAST:  59m ISOVUE-370 IOPAMIDOL (ISOVUE-370) INJECTION 76% COMPARISON:  08/12/2017 and  prior CTs FINDINGS: Lower chest: No acute abnormality. Chronic interstitial lung disease/fibrosis in the lung bases again noted. Hepatobiliary: The liver is unremarkable. The patient is status post cholecystectomy. CBD appears unchanged. Pancreas: A 1.5 cm cystic lesion at the junction of the pancreatic head/body is unchanged. The remainder of the  pancreas is unremarkable. Spleen: Unremarkable Adrenals/Urinary Tract: Bladder wall thickening and perivesical inflammation is noted. There is a 2.5 x 2.5 x 2 cm fluid/gas collection between the sigmoid colon and bladder is unchanged in size from 08/31/2016. The kidneys and adrenal glands are unremarkable. Stomach/Bowel: Mildly distended proximal small bowel loops are noted without definite transition point. This may represent a mild ileus or partial bowel obstruction. Colonic diverticulosis again noted. A moderate to large hiatal hernia is identified. No definite bowel wall thickening noted. Vascular/Lymphatic: Aortic atherosclerosis. No enlarged abdominal or pelvic lymph nodes. Reproductive: Status post hysterectomy. No adnexal masses. Other: No ascites or pneumoperitoneum. Musculoskeletal: No acute or suspicious bony abnormalities. RIGHT femoral head AVN again noted. IMPRESSION: 1. Diffuse bladder wall thickening and perivesical inflammation suspicious for infection/cystitis. 2. 2.5 cm fluid/gas collection between the sigmoid colon and bladder and unchanged in size from 08/31/2016, probably representing chronic collection/diverticulum/sinus tract. 3. Unchanged pancreatic cystic lesion. 4. Bibasilar pulmonary fibrosis/interstitial lung disease. 5.  Aortic Atherosclerosis (ICD10-I70.0). Electronically Signed: By: Margarette Canada M.D. On: 02/09/2018 20:53    EKG: Orders placed or performed during the hospital encounter of 02/09/18  . EKG 12-Lead  . EKG 12-Lead    IMPRESSION AND PLAN:  1.  Acute recurrent UTI, likely sec to colovesical fistula. Will start Rocephin IV and IV fluids.  2.  Chronic colovesical fistula.  Patient has been evaluated in the past for surgical repair, but she was told she would be high risk for complications, due to her pulmonary fibrosis and pulmonary hypertension.  Luckily, patient has not had too many urinary tract infection episodes in the past 2 years.  She says this is probably  the third episode, since she was diagnosed with a colovesical fistula.  We will consult general surgery for further evaluation and treatment. 3.  Acute renal failure, likely prerenal, secondary to poor p.o. Intake.  We will start gentle IV hydration and encourage patient to increase p.o. fluid intake.  Continue to monitor kidney function closely and avoid nephrotoxic medications.  4.  Vertigo, resolving.  Continue Epley maneuvers. 5.  Pulmonary fibrosis, stable.  Continue oxygen and maintenance medications, including prednisone.  Continue management per pulmonary specialist. 6.  Pulmonary hypertension, stable, continue home medications per pulmonary specialist. 7. Paroxysmal Afib, rate controlled currently in normal sinus rhythm.  Will continue medical management, including anticoagulation with Eliquis.   All the records are reviewed and case discussed with ED provider. Management plans discussed with the patient who is in agreement.  CODE STATUS: Code Status History    Date Active Date Inactive Code Status Order ID Comments User Context   08/12/2017 0500 08/16/2017 1730 Full Code [id number]  Saundra Shelling, MD Inpatient   10/27/2016 0039 10/27/2016 1822 Full Code [id number]  Lance Coon, MD Inpatient   08/31/2016 2218 09/03/2016 1808 Full Code [id number]  Hubbard Robinson, MD ED   11/26/2015 0002 11/29/2015 2100 Full Code [id number]  Hillary Bow, MD ED   07/04/2015 1717 07/10/2015 2011 Full Code [id number]  Marlyce Huge, MD ED   03/25/2015 1947 03/29/2015 2044 Full Code [id number]  Vaughan Basta, MD Inpatient       TOTAL TIME TAKING CARE OF THIS PATIENT: 45 minutes.  Amelia Jo M.D on 02/09/2018 at 10:56 PM  Between 7am to 6pm - Pager - [phone]  After 6pm go to [email or web address] - password [email or web address] [facility]  [facility]  Office  [phone]  CC: Primary care physician; Glendon Axe, MD

## 2018-02-09 NOTE — ED Notes (Signed)
Patient transported to CT

## 2018-02-09 NOTE — ED Provider Notes (Signed)
[facility] Emergency Department Provider Note   ____________________________________________   I have reviewed the triage vital signs and the nursing notes.   HISTORY  Chief Complaint Fever and Dysuria   History limited by: Not Limited   HPI Julie Mcmillan is a 77 y.o. female who presents to the emergency department today because of concerns for fever and abdominal pain.  The patient does state that for the past week she has been feeling dizzy.  She describes symptoms of dizziness that are worse with head movement.  She had been doing Epley maneuvers at home with some improvement of her symptoms.  Today however she felt worse.  She was noted to have a fever today.  Additionally she was started complaining of abdominal pain.  Located in the right lower abdomen.  She has also noticed painful urination.  Furthermore the patient states that she has noticed that she has been passing gas in her urine.  The patient states that this has been going on for roughly 4 weeks.   Per medical record review patient has a history of pulmonary fibrosis, hypertension.  Past Medical History:  Diagnosis Date  . A-fib ([place])   . Chronic diarrhea   . Diverticulitis   . GERD (gastroesophageal reflux disease)    usually takes protonix  . Hip fracture ([place]) 11/2015   hairline fracture on right.  no surgery, just physical therapy  . Hypertension   . IBS (irritable bowel syndrome)   . IBS (irritable bowel syndrome)   . Interstitial lung disease ([place])    does not remember when  . Pulmonary fibrosis ([place])   . Pulmonary hypertension ([place])   . Seasonal affective disorder ([place])   . Shortness of breath dyspnea    with exertion  . Type 2 diabetes mellitus ([place]) 11/04/2014    Patient Active Problem List   Diagnosis Date Noted  . Acute diverticulitis 08/12/2017  . Colonic diverticular abscess   . Dyspnea 10/27/2016  . Diverticulitis of large intestine with abscess without bleeding  08/31/2016  . Interstitial lung disease ([place]) 08/31/2016  . Diverticulitis of large intestine with abscess 08/31/2016  . 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 DOE (dyspnea on exertion) 01/26/2016  . Acute bronchitis 11/29/2015  . Acute respiratory failure with hypoxia ([place]) 11/29/2015  . Fracture of greater trochanter of right femur ([place]) 11/29/2015  . Fall 11/26/2015  . OP (osteoporosis) 07/18/2015  . 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 Adult hypothyroidism 07/18/2015  . BP (high blood pressure) 07/18/2015  . Fatigue 07/18/2015  . Anxiety 07/18/2015  . Diverticulitis large intestine 07/04/2015  . Paroxysmal atrial fibrillation ([place]) 04/22/2015  . Clinical depression 04/22/2015  . Atrial fibrillation with rapid ventricular response ([place]) 03/25/2015  . Atrial fibrillation ([place]) 03/25/2015  . Acquired hypothyroidism 01/11/2015  . Anemia, iron deficiency 01/10/2015  . Benign essential HTN 01/10/2015  . Type 2 diabetes mellitus ([place]) 11/04/2014  . 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 Essential (primary) hypertension 11/04/2014  . Temporary cerebral vascular dysfunction 04/08/2014  . Dry mouth 01/05/2013  . Cannot sleep 01/05/2013  . Obstructive apnea 09/02/2012  . Bursitis, ischial 05/19/2012  . Acid reflux 02/01/2012  . DD (diverticular disease) 08/14/2011    Past Surgical History:  Procedure Laterality Date  . ABDOMINAL HYSTERECTOMY     partial  . APPENDECTOMY    . CATARACT EXTRACTION W/PHACO Right 03/16/2015   Procedure: CATARACT EXTRACTION PHACO AND INTRAOCULAR LENS  PLACEMENT (IOC);  Surgeon: Leandrew Koyanagi, MD;  Location: [place];  Service: Ophthalmology;  Laterality: Right;  . CATARACT EXTRACTION W/PHACO Left 06/13/2016   Procedure: CATARACT EXTRACTION PHACO AND INTRAOCULAR LENS PLACEMENT (IOC);  Surgeon: Leandrew Koyanagi, MD;  Location: [place];   Service: Ophthalmology;  Laterality: Left;  pre-diabetic - diet controlled  . COLONOSCOPY    . COLONOSCOPY WITH PROPOFOL N/A 05/25/2016   Procedure: COLONOSCOPY WITH PROPOFOL;  Surgeon: Manya Silvas, MD;  Location: [facility] ENDOSCOPY;  Service: Endoscopy;  Laterality: N/A;  . ESOPHAGOGASTRODUODENOSCOPY (EGD) WITH PROPOFOL N/A 05/25/2016   Procedure: ESOPHAGOGASTRODUODENOSCOPY (EGD) WITH PROPOFOL;  Surgeon: Manya Silvas, MD;  Location: [facility] ENDOSCOPY;  Service: Endoscopy;  Laterality: N/A;  . NISSEN FUNDOPLICATION  4401   came apart in 2016 and ibs and reflux returned  . OPEN REDUCTION INTERNAL FIXATION (ORIF) DISTAL RADIAL FRACTURE Right 01/13/2016   Procedure: OPEN REDUCTION INTERNAL FIXATION (ORIF) DISTAL RADIAL FRACTURE;  Surgeon: Hessie Knows, MD;  Location: [facility] ORS;  Service: Orthopedics;  Laterality: Right;  . TONSILLECTOMY     and adenoidectomy    Prior to Admission medications   Medication Sig Start Date End Date Taking? Authorizing Provider  apixaban (ELIQUIS) 5 MG TABS tablet Take 5 mg by mouth 2 (two) times daily.    [provider]  Calcium Carbonate-Vitamin D (CALCIUM 600+D) 600-200 MG-UNIT TABS Take 1 tablet by mouth daily.    [provider]  cholestyramine Lucrezia Starch) 4 GM/DOSE powder Take 4 g by mouth every morning.     [provider]  ciprofloxacin (CIPRO) 500 MG tablet Take 1 tablet (500 mg total) 2 (two) times daily by mouth. 08/16/17   Epifanio Lesches, MD  feeding supplement, ENSURE ENLIVE, (ENSURE ENLIVE) LIQD Take 237 mLs 2 (two) times daily between meals by mouth. 08/16/17   Epifanio Lesches, MD  FLUoxetine (PROZAC) 20 MG capsule Take 20 mg by mouth daily.    [provider]  furosemide (LASIX) 20 MG tablet Take 1 tablet daily by mouth. 08/06/17 08/06/18  [provider]  hydrALAZINE (APRESOLINE) 25 MG tablet Take 25 mg by mouth 3 (three) times daily.     [provider]  levothyroxine (SYNTHROID,  LEVOTHROID) 50 MCG tablet Take 50 mcg by mouth daily before breakfast.    [provider]  losartan (COZAAR) 100 MG tablet Take 100 mg by mouth daily.     [provider]  metoprolol succinate (TOPROL-XL) 50 MG 24 hr tablet Take 50 mg by mouth daily.    [provider]  pantoprazole (PROTONIX) 40 MG tablet Take 1 tablet (40 mg total) by mouth daily. 03/29/15   Glendon Axe, MD  predniSONE (DELTASONE) 10 MG tablet Take 10 mg by mouth daily. 01/13/17   [provider]  ranitidine (ZANTAC) 300 MG tablet Take 300 mg by mouth at bedtime.     [provider]  sotalol (BETAPACE) 80 MG tablet Take 1 tablet (80 mg total) by mouth every 12 (twelve) hours. 03/29/15   Glendon Axe, MD  tadalafil, PAH, (ADCIRCA) 20 MG tablet Take 2 tablets daily by mouth. 03/25/17   [provider]  Treprostinil (TYVASO) 0.6 MG/ML SOLN Inhale 36 mcg 4 (four) times daily into the lungs. (6 breaths total) 03/04/17 03/04/18  [provider]    Allergies Amlodipine; Nexium [esomeprazole magnesium]; and Aspartame  Family History  Problem Relation Age of Onset  . Breast cancer Mother   . Diabetes Mellitus II Mother   . Hypothyroidism Mother   .  Atrial fibrillation Mother   . Heart attack Father   . Diabetes Maternal Grandmother   . Heart attack Maternal Grandfather     Social History Social History   Tobacco Use  . Smoking status: Never Smoker  . Smokeless tobacco: Never Used  . Tobacco comment: no passive smokers in home  Substance Use Topics  . Alcohol use: No    Alcohol/week: 0.0 oz  . Drug use: No    Review of Systems Constitutional: No fever/chills Eyes: No visual changes. ENT: No sore throat. Cardiovascular: Denies chest pain. Respiratory: Denies shortness of breath. Gastrointestinal: Positive for lower abdominal pain. Genitourinary: Positive for dysuria.  Musculoskeletal: Negative for back pain. Skin: Negative for rash. Neurological:  Negative for headaches, focal weakness or numbness.  ____________________________________________   PHYSICAL EXAM:  VITAL SIGNS: ED Triage Vitals  Enc Vitals Group     BP 02/09/18 1809 (!) 108/51     Pulse Rate 02/09/18 1809 85     Resp 02/09/18 1809 (!) 24     Temp 02/09/18 1809 99.5 F (37.5 C)     Temp Source 02/09/18 1809 Oral     SpO2 02/09/18 1809 (!) 89 %     Weight 02/09/18 1810 145 lb (65.8 kg)     Height 02/09/18 1810 _0  (1.626 m)     Head Circumference --      Peak Flow --      Pain Score 02/09/18 1809 5   Constitutional: Alert and oriented. Well appearing and in no distress. Eyes: Conjunctivae are normal.  ENT   Head: Normocephalic and atraumatic.   Nose: No congestion/rhinnorhea.   Mouth/Throat: Mucous membranes are moist.   Neck: No stridor. Hematological/Lymphatic/Immunilogical: No cervical lymphadenopathy. Cardiovascular: Normal rate, regular rhythm.  No murmurs, rubs, or gallops.  Respiratory: Normal respiratory effort without tachypnea nor retractions. Breath sounds are clear and equal bilaterally. No wheezes/rales/rhonchi. Gastrointestinal: Soft and tender to palpation in the right lower quadrant. No rebound. No guarding.  Genitourinary: Deferred Musculoskeletal: Normal range of motion in all extremities. No lower extremity edema. Neurologic:  Normal speech and language. No gross focal neurologic deficits are appreciated.  Skin:  Skin is warm, dry and intact. No rash noted. Psychiatric: Mood and affect are normal. Speech and behavior are normal. Patient exhibits appropriate insight and judgment.  ____________________________________________    LABS (pertinent positives/negatives)  CBC wbc 22.3, hgb 12.9, plt 282 CMP na 133, glu 136, cr 1.01 Lctic acid 2.1 UA moderate leukocytes, >40 wbcs, wbc clumps present ____________________________________________   EKG  I, Nance Pear, attending physician, personally viewed and  interpreted this EKG  EKG Time: 1842 Rate: 80 Rhythm: normal sinus rhythm Axis: normal Intervals: qtc 445 QRS: narrow, q waves III ST changes: no st elevation Impression: left atrial enlargement, abnormal ekg   ____________________________________________    RADIOLOGY  CT scan Some gas in the bladder, concern for colovesicular fistula  ____________________________________________   PROCEDURES  Procedures  ____________________________________________   INITIAL IMPRESSION / ASSESSMENT AND PLAN / ED COURSE  Pertinent labs & imaging results that were available during my care of the patient were reviewed by me and considered in my medical decision making (see chart for details).  She presented to the emergency department today because of concerns for fever, dizziness and dysuria.  Concern for urinary tract infection.  Additionally patient described passing gas and a urinary stream.  CT scan was obtained which did not show colovesicular fistula.  Discussed with surgery briefly.  Patient is a poor  surgical candidate.  At this point will treat for urinary tract infection. Discussed results with patient and family. ____________________________________________   FINAL CLINICAL IMPRESSION(S) / ED DIAGNOSES  Final diagnoses:  Lower urinary tract infectious disease  Enterovesical fistula     Note: This dictation was prepared with Dragon dictation. Any transcriptional errors that result from this process are unintentional     Nance Pear, MD 02/10/18 [id number]

## 2018-02-09 NOTE — ED Notes (Signed)
Report given to [facility]

## 2018-02-09 NOTE — ED Triage Notes (Signed)
Patient presents to the ED for burning urination and intermittent pelvic pain and a low grade fever.  Family member states that patient's temp at home was 100.2.  Patient reports dizziness since Tuesday.  Patient reports exercises to help vertigo have been helping her dizziness somewhat.  Patient's family states that patient generally needs antibiotics stronger than amoxicillin to fight her infections but oral antibiotics stronger than that give her a bad case of diarrhea.  Patient reports history of pulmonary fibrosis and pulmonary hypertension.

## 2018-02-09 NOTE — ED Notes (Signed)
Pt back from CT

## 2018-02-09 NOTE — ED Notes (Signed)
Patient also reports vomiting x 2 and diarrhea x 4 today.

## 2018-02-09 NOTE — ED Notes (Signed)
Date and time results received: 02/09/18 1905 (use smartphrase ".now" to insert current time)  Test: lactic Critical Value: 2.1  Name of Provider Notified: Archie Balboa  Orders Received? Or Actions Taken?: Orders Received - See Orders for details

## 2018-02-09 NOTE — ED Notes (Signed)
Patient assisted to the restroom by this EDT.

## 2018-02-09 NOTE — ED Notes (Signed)
Attempted to call report. RN Silva Bandy unable to take report at this time.

## 2018-02-10 DIAGNOSIS — N321 Vesicointestinal fistula: Secondary | ICD-10-CM

## 2018-02-10 LAB — BASIC METABOLIC PANEL
ANION GAP: 5 (ref 5–15)
BUN: 16 mg/dL (ref 6–20)
CO2: 25 mmol/L (ref 22–32)
Calcium: 7.8 mg/dL — ABNORMAL LOW (ref 8.9–10.3)
Chloride: 107 mmol/L (ref 101–111)
Creatinine, Ser: 0.88 mg/dL (ref 0.44–1.00)
GFR calc non Af Amer: >60 mL/min (ref >60)
Glucose, Bld: 94 mg/dL (ref 65–99)
POTASSIUM: 4.3 mmol/L (ref 3.5–5.1)
SODIUM: 137 mmol/L (ref 135–145)

## 2018-02-10 LAB — GLUCOSE, CAPILLARY
GLUCOSE-CAPILLARY: 108 mg/dL — AB (ref 65–99)
GLUCOSE-CAPILLARY: 145 mg/dL — AB (ref 65–99)
Glucose-Capillary: 74 mg/dL (ref 65–99)
Glucose-Capillary: 172 mg/dL — ABNORMAL HIGH (ref 65–99)

## 2018-02-10 LAB — CBC
HEMATOCRIT: 32.9 % — AB (ref 35.0–47.0)
HEMOGLOBIN: 10.9 g/dL — AB (ref 12.0–16.0)
MCH: 27.1 pg (ref 26.0–34.0)
MCHC: 33.1 g/dL (ref 32.0–36.0)
MCV: 81.8 fL (ref 80.0–100.0)
Platelets: 238 10*3/uL (ref 150–440)
RBC: 4.03 MIL/uL (ref 3.80–5.20)
RDW: 14.8 % — ABNORMAL HIGH (ref 11.5–14.5)
WBC: 15.6 10*3/uL — AB (ref 3.6–11.0)

## 2018-02-10 MED ORDER — ACETAMINOPHEN 650 MG RE SUPP: 650.0000 mg | Freq: Four times a day (QID) | RECTAL | Status: DC | PRN

## 2018-02-10 MED ORDER — LEVOTHYROXINE SODIUM 50 MCG PO TABS
50.0000 ug | ORAL_TABLET | Freq: Every day | ORAL | Status: DC
  Administered 2018-02-10 – 2018-02-12 (×3): 50 ug via ORAL
  Filled 2018-02-10 (×3): qty 1

## 2018-02-10 MED ORDER — PREDNISONE 20 MG PO TABS
10.0000 mg | ORAL_TABLET | Freq: Every day | ORAL | Status: DC
  Administered 2018-02-10 – 2018-02-12 (×3): 10 mg via ORAL
  Filled 2018-02-10 (×3): qty 1

## 2018-02-10 MED ORDER — ACETAMINOPHEN 325 MG PO TABS
650.0000 mg | ORAL_TABLET | Freq: Four times a day (QID) | ORAL | Status: DC | PRN
  Administered 2018-02-10 – 2018-02-11 (×3): 650 mg via ORAL
  Filled 2018-02-10 (×3): qty 2

## 2018-02-10 MED ORDER — FAMOTIDINE 20 MG PO TABS
10.0000 mg | ORAL_TABLET | Freq: Every day | ORAL | Status: DC
  Administered 2018-02-10 – 2018-02-12 (×3): 10 mg via ORAL
  Filled 2018-02-10 (×3): qty 1

## 2018-02-10 MED ORDER — SODIUM CHLORIDE 0.9 % IV SOLN
1.0000 g | INTRAVENOUS | Status: DC
Start: 2018-02-10 — End: 2018-02-11
  Administered 2018-02-10: 1 g via INTRAVENOUS
  Filled 2018-02-10: qty 10
  Filled 2018-02-10: qty 1

## 2018-02-10 MED ORDER — CALCIUM CARBONATE-VITAMIN D 500-200 MG-UNIT PO TABS
1.0000 | ORAL_TABLET | Freq: Every day | ORAL | Status: DC
  Administered 2018-02-10 – 2018-02-12 (×3): 1 via ORAL
  Filled 2018-02-10 (×3): qty 1

## 2018-02-10 MED ORDER — BISACODYL 5 MG PO TBEC: 5.0000 mg | DELAYED_RELEASE_TABLET | Freq: Every day | ORAL | Status: DC | PRN

## 2018-02-10 MED ORDER — INSULIN ASPART 100 UNIT/ML ~~LOC~~ SOLN
0.0000 [IU] | Freq: Three times a day (TID) | SUBCUTANEOUS | Status: DC
  Administered 2018-02-10: 2 [IU] via SUBCUTANEOUS
  Administered 2018-02-11: 1 [IU] via SUBCUTANEOUS
  Filled 2018-02-10 (×2): qty 1

## 2018-02-10 MED ORDER — DOCUSATE SODIUM 100 MG PO CAPS
100.0000 mg | ORAL_CAPSULE | Freq: Two times a day (BID) | ORAL | Status: DC
  Administered 2018-02-12: 100 mg via ORAL
  Filled 2018-02-10 (×3): qty 1

## 2018-02-10 MED ORDER — APIXABAN 5 MG PO TABS
5.0000 mg | ORAL_TABLET | Freq: Two times a day (BID) | ORAL | Status: DC
  Administered 2018-02-10 – 2018-02-12 (×6): 5 mg via ORAL
  Filled 2018-02-10 (×6): qty 1

## 2018-02-10 MED ORDER — TRAZODONE HCL 50 MG PO TABS
25.0000 mg | ORAL_TABLET | Freq: Every evening | ORAL | Status: DC | PRN
  Administered 2018-02-11: 25 mg via ORAL
  Filled 2018-02-10: qty 1

## 2018-02-10 MED ORDER — HYDROCODONE-ACETAMINOPHEN 5-325 MG PO TABS: 1.0000 | ORAL_TABLET | ORAL | Status: DC | PRN

## 2018-02-10 MED ORDER — HYDRALAZINE HCL 25 MG PO TABS
25.0000 mg | ORAL_TABLET | Freq: Three times a day (TID) | ORAL | Status: DC
  Administered 2018-02-10 (×2): 25 mg via ORAL
  Filled 2018-02-10 (×3): qty 1

## 2018-02-10 MED ORDER — TADALAFIL (PAH) 20 MG PO TABS
40.0000 mg | ORAL_TABLET | Freq: Every day | ORAL | Status: DC
  Filled 2018-02-10: qty 2

## 2018-02-10 MED ORDER — INSULIN ASPART 100 UNIT/ML ~~LOC~~ SOLN: 0.0000 [IU] | Freq: Every day | SUBCUTANEOUS | Status: DC

## 2018-02-10 MED ORDER — CHOLESTYRAMINE 4 G PO PACK
4.0000 g | PACK | ORAL | Status: DC
  Administered 2018-02-10 – 2018-02-12 (×3): 4 g via ORAL
  Filled 2018-02-10 (×3): qty 1

## 2018-02-10 MED ORDER — FLUOXETINE HCL 20 MG PO CAPS
20.0000 mg | ORAL_CAPSULE | Freq: Every day | ORAL | Status: DC
  Administered 2018-02-10 – 2018-02-12 (×2): 20 mg via ORAL
  Filled 2018-02-10 (×3): qty 1

## 2018-02-10 MED ORDER — ENSURE ENLIVE PO LIQD
237.0000 mL | Freq: Two times a day (BID) | ORAL | Status: DC
  Administered 2018-02-10: 237 mL via ORAL

## 2018-02-10 MED ORDER — FUROSEMIDE 20 MG PO TABS
20.0000 mg | ORAL_TABLET | Freq: Every day | ORAL | Status: DC
  Administered 2018-02-10 – 2018-02-12 (×2): 20 mg via ORAL
  Filled 2018-02-10 (×2): qty 1

## 2018-02-10 MED ORDER — SOTALOL HCL 80 MG PO TABS
80.0000 mg | ORAL_TABLET | Freq: Two times a day (BID) | ORAL | Status: DC
  Administered 2018-02-10 – 2018-02-12 (×5): 80 mg via ORAL
  Filled 2018-02-10 (×6): qty 1

## 2018-02-10 MED ORDER — METOPROLOL SUCCINATE ER 50 MG PO TB24: 50.0000 mg | ORAL_TABLET | Freq: Every day | ORAL | Status: DC

## 2018-02-10 MED ORDER — ONDANSETRON HCL 4 MG PO TABS: 4.0000 mg | ORAL_TABLET | Freq: Four times a day (QID) | ORAL | Status: DC | PRN

## 2018-02-10 MED ORDER — TADALAFIL (PAH) 20 MG PO TABS
40.0000 mg | ORAL_TABLET | Freq: Every day | ORAL | Status: DC
  Administered 2018-02-10 – 2018-02-11 (×2): 40 mg via ORAL
  Filled 2018-02-10 (×3): qty 2

## 2018-02-10 MED ORDER — LOSARTAN POTASSIUM 50 MG PO TABS
100.0000 mg | ORAL_TABLET | Freq: Every day | ORAL | Status: DC
  Administered 2018-02-10 – 2018-02-12 (×3): 100 mg via ORAL
  Filled 2018-02-10 (×3): qty 2

## 2018-02-10 MED ORDER — TREPROSTINIL 0.6 MG/ML IN SOLN
36.0000 ug | Freq: Four times a day (QID) | RESPIRATORY_TRACT | Status: DC
  Administered 2018-02-10 – 2018-02-12 (×9): 36 ug via RESPIRATORY_TRACT
  Filled 2018-02-10 (×4): qty 2.9

## 2018-02-10 MED ORDER — SODIUM CHLORIDE 0.9 % IV SOLN
Freq: Once | INTRAVENOUS | Status: AC
  Administered 2018-02-10: 03:00:00 via INTRAVENOUS

## 2018-02-10 MED ORDER — ONDANSETRON HCL 4 MG/2ML IJ SOLN
4.0000 mg | Freq: Four times a day (QID) | INTRAMUSCULAR | Status: DC | PRN
  Administered 2018-02-10: 4 mg via INTRAVENOUS
  Filled 2018-02-10: qty 2

## 2018-02-10 MED ORDER — PANTOPRAZOLE SODIUM 40 MG PO TBEC
40.0000 mg | DELAYED_RELEASE_TABLET | Freq: Every day | ORAL | Status: DC
  Administered 2018-02-10 – 2018-02-12 (×3): 40 mg via ORAL
  Filled 2018-02-10 (×3): qty 1

## 2018-02-10 NOTE — ED Notes (Signed)
Transport to floor room 215.AS

## 2018-02-10 NOTE — Consult Note (Signed)
Patient ID: Julie Mcmillan, female   DOB: 1941/05/14, 77 y.o.   MRN: [id number]  HPI Julie Mcmillan is a 77 y.o. female asked to see patient in consultation at the request of Dr. Duane Boston.  Patient is a 77 year old female with a known history of A. fib on anticoagulation history of interstitial lung disease as well as pulmonary hypertension oxygen dependent.  She presented to the emergency room complaining of fever dysuria and lower abdominal pain that was present for the last 24 hours.  Patient reports the pain is intermittent moderate intensity and sharp in nature.  She reports that she has had similar episodes in the past. She also reports some hyperpyrexia. CTscanpersonally reviewed there is evidence of sigmoid diverticulosis with the majority of inflammation around the bladder.  There is a small fluid collection between the sigmoid and the bladder that has been unchanged.  There is also diffuse specks of air within the bladder.  No evidence of overt free air.  Moderate hiatal hernia. She was examined previously and was deemed not to be a surgical candidate given her significant pulmonary hypertension and interstitial lung disease.  Her pulmonologist did not clear her for any surgical intervention.  She continues to be having dyspnea on exertion and is anticoagulated. White count is 22,000 with a normal creatinine and a slight elevation of lactic acid now njoramlized.  UA c/w UTI  HPI  Past Medical History:  Diagnosis Date  . A-fib ([place])   . Chronic diarrhea   . Diverticulitis   . GERD (gastroesophageal reflux disease)    usually takes protonix  . Hip fracture ([place]) 11/2015   hairline fracture on right.  no surgery, just physical therapy  . Hypertension   . IBS (irritable bowel syndrome)   . IBS (irritable bowel syndrome)   . Interstitial lung disease ([place])    does not remember when  . Pulmonary fibrosis ([place])   . Pulmonary hypertension ([place])   . Seasonal affective disorder ([place])   .  Shortness of breath dyspnea    with exertion  . Type 2 diabetes mellitus ([place]) 11/04/2014    Past Surgical History:  Procedure Laterality Date  . ABDOMINAL HYSTERECTOMY     partial  . APPENDECTOMY    . CATARACT EXTRACTION W/PHACO Right 03/16/2015   Procedure: CATARACT EXTRACTION PHACO AND INTRAOCULAR LENS PLACEMENT (IOC);  Surgeon: Leandrew Koyanagi, MD;  Location: [place];  Service: Ophthalmology;  Laterality: Right;  . CATARACT EXTRACTION W/PHACO Left 06/13/2016   Procedure: CATARACT EXTRACTION PHACO AND INTRAOCULAR LENS PLACEMENT (IOC);  Surgeon: Leandrew Koyanagi, MD;  Location: [place];  Service: Ophthalmology;  Laterality: Left;  pre-diabetic - diet controlled  . COLONOSCOPY    . COLONOSCOPY WITH PROPOFOL N/A 05/25/2016   Procedure: COLONOSCOPY WITH PROPOFOL;  Surgeon: Manya Silvas, MD;  [facility] ENDOSCOPY;  Service: Endoscopy;  Laterality: N/A;  . ESOPHAGOGASTRODUODENOSCOPY (EGD) WITH PROPOFOL N/A 05/25/2016   Procedure: ESOPHAGOGASTRODUODENOSCOPY (EGD) WITH PROPOFOL;  Surgeon: Manya Silvas, MD;  Location: [facility] ENDOSCOPY;  Service: Endoscopy;  Laterality: N/A;  . NISSEN FUNDOPLICATION  2703   came apart in 2016 and ibs and reflux returned  . OPEN REDUCTION INTERNAL FIXATION (ORIF) DISTAL RADIAL FRACTURE Right 01/13/2016   Procedure: OPEN REDUCTION INTERNAL FIXATION (ORIF) DISTAL RADIAL FRACTURE;  Surgeon: Hessie Knows, MD;  Location: [facility] ORS;  Service: Orthopedics;  Laterality: Right;  . TONSILLECTOMY     and adenoidectomy    Family History  Problem Relation Age of Onset  .  Breast cancer Mother   . Diabetes Mellitus II Mother   . Hypothyroidism Mother   . Atrial fibrillation Mother   . Heart attack Father   . Diabetes Maternal Grandmother   . Heart attack Maternal Grandfather     Social History Social History   Tobacco Use  . Smoking status: Never Smoker  . Smokeless tobacco: Never Used  . Tobacco comment: no passive smokers in  home  Substance Use Topics  . Alcohol use: No    Alcohol/week: 0.0 oz  . Drug use: No    Allergies  Allergen Reactions  . Amlodipine Swelling  . Nexium [Esomeprazole Magnesium] Diarrhea  . Aspartame Other (See Comments)    Patient reports "self diagnosed intolerance" to artificial sweeteners.    Current Facility-Administered Medications  Medication Dose Route Frequency Provider Last Rate Last Dose  . acetaminophen (TYLENOL) tablet 650 mg  650 mg Oral Q6H PRN Amelia Jo, MD   650 mg at 02/10/18 8413   Or  . acetaminophen (TYLENOL) suppository 650 mg  650 mg Rectal Q6H PRN Amelia Jo, MD      . apixaban Arne [place]) tablet 5 mg  5 mg Oral BID Amelia Jo, MD   5 mg at 02/10/18 1103  . bisacodyl (DULCOLAX) EC tablet 5 mg  5 mg Oral Daily PRN Amelia Jo, MD      . calcium-vitamin D (OSCAL WITH D) 500-200 MG-UNIT per tablet 1 tablet  1 tablet Oral Daily Amelia Jo, MD   1 tablet at 02/10/18 1104  . cefTRIAXone (ROCEPHIN) 1 g in sodium chloride 0.9 % 100 mL IVPB  1 g Intravenous Q24H Amelia Jo, MD      . cholestyramine Lucrezia Starch) packet 4 g  4 g Oral Junie Panning, MD   4 g at 02/10/18 0740  . docusate sodium (COLACE) capsule 100 mg  100 mg Oral BID Amelia Jo, MD      . famotidine (PEPCID) tablet 10 mg  10 mg Oral Daily Amelia Jo, MD   10 mg at 02/10/18 1104  . feeding supplement (ENSURE ENLIVE) (ENSURE ENLIVE) liquid 237 mL  237 mL Oral BID BM Amelia Jo, MD   237 mL at 02/10/18 1112  . FLUoxetine (PROZAC) capsule 20 mg  20 mg Oral Daily Amelia Jo, MD   20 mg at 02/10/18 1112  . furosemide (LASIX) tablet 20 mg  20 mg Oral Daily Amelia Jo, MD   20 mg at 02/10/18 1103  . hydrALAZINE (APRESOLINE) tablet 25 mg  25 mg Oral TID Amelia Jo, MD   25 mg at 02/10/18 1103  . HYDROcodone-acetaminophen (NORCO/VICODIN) 5-325 MG per tablet 1-2 tablet  1-2 tablet Oral Q4H PRN Amelia Jo, MD      . insulin aspart (novoLOG) injection 0-5 Units  0-5 Units  Subcutaneous QHS Amelia Jo, MD      . insulin aspart (novoLOG) injection 0-9 Units  0-9 Units Subcutaneous TID WC Amelia Jo, MD      . levothyroxine (SYNTHROID, LEVOTHROID) tablet 50 mcg  50 mcg Oral QAC breakfast Amelia Jo, MD   50 mcg at 02/10/18 [phone]  . losartan (COZAAR) tablet 100 mg  100 mg Oral Daily Amelia Jo, MD   100 mg at 02/10/18 1103  . ondansetron (ZOFRAN) tablet 4 mg  4 mg Oral Q6H PRN Amelia Jo, MD       Or  . ondansetron [facility]) injection 4 mg  4 mg Intravenous Q6H PRN Amelia Jo, MD   4 mg at 02/10/18  1120  . pantoprazole (PROTONIX) EC tablet 40 mg  40 mg Oral Daily Amelia Jo, MD   40 mg at 02/10/18 1102  . predniSONE (DELTASONE) tablet 10 mg  10 mg Oral Daily Amelia Jo, MD   10 mg at 02/10/18 1102  . sotalol (BETAPACE) tablet 80 mg  80 mg Oral Q12H Amelia Jo, MD   80 mg at 02/10/18 1107  . tadalafil (PAH) (ADCIRCA) tablet 40 mg  40 mg Oral QHS Amelia Jo, MD      . traZODone (DESYREL) tablet 25 mg  25 mg Oral QHS PRN Amelia Jo, MD      . Treprostinil (TYVASO) inhalation solution 36 mcg  36 mcg Inhalation QID Amelia Jo, MD   36 mcg at 02/10/18 1030     Review of Systems Full ROS  was asked and was negative except for the information on the HPI  Physical Exam Blood pressure 118/61, pulse 92, temperature 98.9 F (37.2 C), temperature source Oral, resp. rate 16, height 5' 4" (1.626 m), weight 67.7 kg (149 lb 3.2 oz), SpO2 97 %. CONSTITUTIONAL: elderly female , non toxic. EYES: Pupils are equal, round, and reactive to light, Sclera are non-icteric. EARS, NOSE, MOUTH AND THROAT: The oropharynx is clear. The oral mucosa is pink and moist. Hearing is intact to voice. LYMPH NODES:  Lymph nodes in the neck are normal. RESPIRATORY:  Lungs are clear but distant BS. There is normal respiratory effort, with equal breath sounds bilaterally, and without pathologic use of accessory muscles. CARDIOVASCULAR: Heart is regular without murmurs,  gallops, or rubs. GI: The abdomen is  soft, Mild TTP RLQ and suprapubic area, no peritonitis or rebound. There is no hepatosplenomegaly. There are normal bowel sounds in all quadrants. GU: Rectal deferred.   MUSCULOSKELETAL: Normal muscle strength and tone. No cyanosis or edema.   SKIN: Turgor is good and there are no pathologic skin lesions or ulcers. NEUROLOGIC: Motor and sensation is grossly normal. Cranial nerves are grossly intact. PSYCH:  Oriented to person, place and time. Affect is normal.  Data Reviewed  I have personally reviewed the patient's imaging, laboratory findings and medical records.    Assessment/Plan 77 year old female with significant pulmonary hypertension and interstitial lung disease as well as A. fib now anticoagulated.  She presents with chronic colovesical fistula.  Given her pulmonary status her pulmonologist has her not a surgical candidate.  With that being said there is no much of any surgical intervention at this time.  Discussed with the patient in detail about her disease process and she is in agreement with her pulmonologist.  She rather treat this with antibiotics and  Wishes to avoid any surgical intervention at this time.  Recommend continuation of p.o. antibiotics, advance diet slowly as tolerated. We will be available any surgical needs arise.    Caroleen Hamman, MD FACS General Surgeon 02/10/2018, 12:35 PM

## 2018-02-10 NOTE — Progress Notes (Addendum)
Patient ID: Julie Mcmillan, female   DOB: 1941-05-18, 77 y.o.   MRN: [id number]  [facility] PROGRESS NOTE  Julie Mcmillan VXB:[id number] DOB: 1941-09-12 DOA: 02/09/2018 PCP: Glendon Axe, MD  HPI/Subjective: Patient not feeling well.  Still having some lower abdominal discomfort.  Still having some burning on urination.  Objective: Vitals:   02/10/18 0201 02/10/18 1458  BP: 118/61 (!) 113/54  Pulse: 92 88  Resp: 16 14  Temp: 98.9 F (37.2 C) 98.3 F (36.8 C)  SpO2: 97% 96%    Filed Weights   02/09/18 1810 02/10/18 0201  Weight: 65.8 kg (145 lb) 67.7 kg (149 lb 3.2 oz)    ROS: Review of Systems  Constitutional: Negative for chills and fever.  Eyes: Negative for blurred vision.  Respiratory: Negative for cough and shortness of breath.   Cardiovascular: Negative for chest pain.  Gastrointestinal: Positive for abdominal pain and nausea. Negative for constipation, diarrhea and vomiting.  Genitourinary: Positive for dysuria and hematuria.  Musculoskeletal: Negative for joint pain.  Neurological: Negative for dizziness and headaches.   Exam: Physical Exam  Constitutional: She is oriented to person, place, and time.  HENT:  Nose: No mucosal edema.  Mouth/Throat: No oropharyngeal exudate or posterior oropharyngeal edema.  Eyes: Pupils are equal, round, and reactive to light. Conjunctivae, EOM and lids are normal.  Neck: No JVD present. Carotid bruit is not present. No edema present. No thyroid mass and no thyromegaly present.  Cardiovascular: S1 normal and S2 normal. Exam reveals no gallop.  No murmur heard. Pulses:      Dorsalis pedis pulses are 2+ on the right side, and 2+ on the left side.  Respiratory: No respiratory distress. She has no wheezes. She has no rhonchi. She has no rales.  GI: Soft. Bowel sounds are normal. There is tenderness in the suprapubic area.  Musculoskeletal:       Right ankle: She exhibits no swelling.       Left ankle: She exhibits no  swelling.  Lymphadenopathy:    She has no cervical adenopathy.  Neurological: She is alert and oriented to person, place, and time. No cranial nerve deficit.  Skin: Skin is warm. No rash noted. Nails show no clubbing.  Psychiatric: She has a normal mood and affect.      Data Reviewed: Basic Metabolic Panel: Recent Labs  Lab 02/09/18 1828 02/10/18 0449  NA 133* 137  K 4.3 4.3  CL 105 107  CO2 21* 25  GLUCOSE 136* 94  BUN 21* 16  CREATININE 1.01* 0.88  CALCIUM 8.6* 7.8*   Liver Function Tests: Recent Labs  Lab 02/09/18 1828  AST 22  ALT 10*  ALKPHOS 55  BILITOT 0.7  PROT 6.1*  ALBUMIN 3.7   CBC: Recent Labs  Lab 02/09/18 1828 02/10/18 0449  WBC 22.3* 15.6*  NEUTROABS 19.0*  --   HGB 12.9 10.9*  HCT 37.1 32.9*  MCV 80.9 81.8  PLT 282 238    CBG: Recent Labs  Lab 02/10/18 0745 02/10/18 1137  GLUCAP 74 108*     Studies: Ct Abdomen Pelvis W Contrast  Addendum Date: 02/09/2018   ADDENDUM REPORT: 02/09/2018 21:10 ADDENDUM: Patient describes a history of passing gas from the bladder. There is a tiny focus of gas within the bladder and this suggests a patent colovesical fistula. Impression should also read: Distended proximal small bowel loops, most likely ileus. Moderate to large hiatal hernia. Electronically Signed   By: Margarette Canada M.D.   On:  02/09/2018 21:10   Result Date: 02/09/2018 CLINICAL DATA:  77 year old female with acute abdominal and pelvic pain and fever. EXAM: CT ABDOMEN AND PELVIS WITH CONTRAST TECHNIQUE: Multidetector CT imaging of the abdomen and pelvis was performed using the standard protocol following bolus administration of intravenous contrast. CONTRAST:  18m ISOVUE-370 IOPAMIDOL (ISOVUE-370) INJECTION 76% COMPARISON:  08/12/2017 and prior CTs FINDINGS: Lower chest: No acute abnormality. Chronic interstitial lung disease/fibrosis in the lung bases again noted. Hepatobiliary: The liver is unremarkable. The patient is status post  cholecystectomy. CBD appears unchanged. Pancreas: A 1.5 cm cystic lesion at the junction of the pancreatic head/body is unchanged. The remainder of the pancreas is unremarkable. Spleen: Unremarkable Adrenals/Urinary Tract: Bladder wall thickening and perivesical inflammation is noted. There is a 2.5 x 2.5 x 2 cm fluid/gas collection between the sigmoid colon and bladder is unchanged in size from 08/31/2016. The kidneys and adrenal glands are unremarkable. Stomach/Bowel: Mildly distended proximal small bowel loops are noted without definite transition point. This may represent a mild ileus or partial bowel obstruction. Colonic diverticulosis again noted. A moderate to large hiatal hernia is identified. No definite bowel wall thickening noted. Vascular/Lymphatic: Aortic atherosclerosis. No enlarged abdominal or pelvic lymph nodes. Reproductive: Status post hysterectomy. No adnexal masses. Other: No ascites or pneumoperitoneum. Musculoskeletal: No acute or suspicious bony abnormalities. RIGHT femoral head AVN again noted. IMPRESSION: 1. Diffuse bladder wall thickening and perivesical inflammation suspicious for infection/cystitis. 2. 2.5 cm fluid/gas collection between the sigmoid colon and bladder and unchanged in size from 08/31/2016, probably representing chronic collection/diverticulum/sinus tract. 3. Unchanged pancreatic cystic lesion. 4. Bibasilar pulmonary fibrosis/interstitial lung disease. 5.  Aortic Atherosclerosis (ICD10-I70.0). Electronically Signed: By: JMargarette CanadaM.D. On: 02/09/2018 20:53    Scheduled Meds: . apixaban  5 mg Oral BID  . calcium-vitamin D  1 tablet Oral Daily  . cholestyramine  4 g Oral BH-q7a  . docusate sodium  100 mg Oral BID  . famotidine  10 mg Oral Daily  . feeding supplement (ENSURE ENLIVE)  237 mL Oral BID BM  . FLUoxetine  20 mg Oral Daily  . furosemide  20 mg Oral Daily  . hydrALAZINE  25 mg Oral TID  . insulin aspart  0-5 Units Subcutaneous QHS  . insulin aspart   0-9 Units Subcutaneous TID WC  . levothyroxine  50 mcg Oral QAC breakfast  . losartan  100 mg Oral Daily  . pantoprazole  40 mg Oral Daily  . predniSONE  10 mg Oral Daily  . sotalol  80 mg Oral Q12H  . tadalafil (PAH)  40 mg Oral QHS  . Treprostinil  36 mcg Inhalation QID   Continuous Infusions: . cefTRIAXone (ROCEPHIN)  IV      Assessment/Plan:  1. Acute cystitis with hematuria,  Leukocytosis.  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  Await urine culture.  Currently on Rocephin.  Patient not interested in surgery at this time.  The radiologist had stated that fistula could be there from the last CAT scan.    2. 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 Chronic atrial fibrillation on sotalol and Eliquis  4. hypothyroidism unspecified on levothyroxine 5.  essential hypertension on Cozaar 6. Depression on Prozac 7. Pulmonary hypertension continue current medications  Code Status:     Code Status Orders  (From admission, onward)        Start     Ordered   02/10/18 0146  Full code  Continuous     02/10/18  0145    Code Status History    Date Active Date Inactive Code Status Order ID Comments User Context   08/12/2017 0500 08/16/2017 1730 Full Code [id number]  Saundra Shelling, MD Inpatient   10/27/2016 0039 10/27/2016 1822 Full Code [id number]  Lance Coon, MD Inpatient   08/31/2016 2218 09/03/2016 1808 Full Code [id number]  Hubbard Robinson, MD ED   11/26/2015 0002 11/29/2015 2100 Full Code [id number]  Hillary Bow, MD ED   07/04/2015 1717 07/10/2015 2011 Full Code [id number]  Marlyce Huge, MD ED   03/25/2015 1947 03/29/2015 2044 Full Code [id number]  Vaughan Basta, MD Inpatient    Advance Directive Documentation     Most Recent Value  Type of Advance Directive  Healthcare Power of Attorney, Living will  Pre-existing out of facility DNR order (yellow form or pink MOST form)  -  "MOST" Form in Place?  -     Disposition Plan:  Will need urine culture results prior to disposition  Antibiotics:  Rocephin  Time spent: 28 minutes  [place]

## 2018-02-11 LAB — GLUCOSE, CAPILLARY
GLUCOSE-CAPILLARY: 99 mg/dL (ref 65–99)
GLUCOSE-CAPILLARY: 141 mg/dL — AB (ref 65–99)
Glucose-Capillary: 81 mg/dL (ref 65–99)
Glucose-Capillary: 132 mg/dL — ABNORMAL HIGH (ref 65–99)

## 2018-02-11 LAB — CBC
HCT: 33.1 % — ABNORMAL LOW (ref 35.0–47.0)
Hemoglobin: 11.1 g/dL — ABNORMAL LOW (ref 12.0–16.0)
MCH: 27.5 pg (ref 26.0–34.0)
MCHC: 33.6 g/dL (ref 32.0–36.0)
MCV: 81.8 fL (ref 80.0–100.0)
PLATELETS: 227 10*3/uL (ref 150–440)
RBC: 4.04 MIL/uL (ref 3.80–5.20)
RDW: 14.7 % — AB (ref 11.5–14.5)
WBC: 9.5 10*3/uL (ref 3.6–11.0)

## 2018-02-11 MED ORDER — SODIUM CHLORIDE 0.9 % IV SOLN
1.0000 g | Freq: Two times a day (BID) | INTRAVENOUS | Status: DC
  Administered 2018-02-11 (×2): 1 g via INTRAVENOUS
  Filled 2018-02-11 (×4): qty 1

## 2018-02-11 MED ORDER — HYDRALAZINE HCL 10 MG PO TABS
10.0000 mg | ORAL_TABLET | Freq: Three times a day (TID) | ORAL | Status: DC
  Administered 2018-02-11 (×2): 10 mg via ORAL
  Filled 2018-02-11 (×3): qty 1

## 2018-02-11 NOTE — Progress Notes (Signed)
Patient ID: Julie Mcmillan, female   DOB: [birth year], 77 y.o.   MRN: [id number]  [facility] PROGRESS NOTE  Julie Mcmillan PPJ:[id number] DOB: 1941-03-19 DOA: 02/09/2018 PCP: Glendon Axe, MD  HPI/Subjective: Patient feels little bit better than coming in but still does not feel great.  Still with some abdominal pain and some nausea.  Urine is looking better color.  Objective: Vitals:   02/11/18 0744 02/11/18 1257  BP:  (!) 133/59  Pulse:  65  Resp:    Temp:  97.6 F (36.4 C)  SpO2: 98% 97%    Filed Weights   02/09/18 1810 02/10/18 0201 02/11/18 0421  Weight: 65.8 kg (145 lb) 67.7 kg (149 lb 3.2 oz) 67.3 kg (148 lb 5.9 oz)    ROS: Review of Systems  Constitutional: Negative for chills and fever.  Eyes: Negative for blurred vision.  Respiratory: Negative for cough and shortness of breath.   Cardiovascular: Negative for chest pain.  Gastrointestinal: Positive for abdominal pain and nausea. Negative for constipation, diarrhea and vomiting.  Genitourinary: Positive for dysuria. Negative for hematuria.  Musculoskeletal: Negative for joint pain.  Neurological: Negative for dizziness and headaches.   Exam: Physical Exam  Constitutional: She is oriented to person, place, and time.  HENT:  Nose: No mucosal edema.  Mouth/Throat: No oropharyngeal exudate or posterior oropharyngeal edema.  Eyes: Pupils are equal, round, and reactive to light. Conjunctivae, EOM and lids are normal.  Neck: No JVD present. Carotid bruit is not present. No edema present. No thyroid mass and no thyromegaly present.  Cardiovascular: S1 normal and S2 normal. Exam reveals no gallop.  Murmur heard.  Systolic murmur is present with a grade of 2/6. Pulses:      Dorsalis pedis pulses are 2+ on the right side, and 2+ on the left side.  Respiratory: No respiratory distress. She has no wheezes. She has rhonchi in the right middle field, the right lower field, the left middle field and the left lower  field. She has no rales.  GI: Soft. Bowel sounds are normal. There is tenderness in the suprapubic area.  Musculoskeletal:       Right ankle: She exhibits no swelling.       Left ankle: She exhibits no swelling.  Lymphadenopathy:    She has no cervical adenopathy.  Neurological: She is alert and oriented to person, place, and time. No cranial nerve deficit.  Skin: Skin is warm. No rash noted. Nails show no clubbing.  Psychiatric: She has a normal mood and affect.      Data Reviewed: Basic Metabolic Panel: Recent Labs  Lab 02/09/18 1828 02/10/18 0449  NA 133* 137  K 4.3 4.3  CL 105 107  CO2 21* 25  GLUCOSE 136* 94  BUN 21* 16  CREATININE 1.01* 0.88  CALCIUM 8.6* 7.8*   Liver Function Tests: Recent Labs  Lab 02/09/18 1828  AST 22  ALT 10*  ALKPHOS 55  BILITOT 0.7  PROT 6.1*  ALBUMIN 3.7   CBC: Recent Labs  Lab 02/09/18 1828 02/10/18 0449 02/11/18 0432  WBC 22.3* 15.6* 9.5  NEUTROABS 19.0*  --   --   HGB 12.9 10.9* 11.1*  HCT 37.1 32.9* 33.1*  MCV 80.9 81.8 81.8  PLT 282 238 227    CBG: Recent Labs  Lab 02/10/18 1137 02/10/18 1705 02/10/18 2059 02/11/18 0733 02/11/18 1146  GLUCAP 108* 172* 145* 81 99     Studies: Ct Abdomen Pelvis W Contrast  Addendum Date: 02/09/2018  ADDENDUM REPORT: 02/09/2018 21:10 ADDENDUM: Patient describes a history of passing gas from the bladder. There is a tiny focus of gas within the bladder and this suggests a patent colovesical fistula. Impression should also read: Distended proximal small bowel loops, most likely ileus. Moderate to large hiatal hernia. Electronically Signed   By: Margarette Canada M.D.   On: 02/09/2018 21:10   Result Date: 02/09/2018 CLINICAL DATA:  77 year old female with acute abdominal and pelvic pain and fever. EXAM: CT ABDOMEN AND PELVIS WITH CONTRAST TECHNIQUE: Multidetector CT imaging of the abdomen and pelvis was performed using the standard protocol following bolus administration of intravenous  contrast. CONTRAST:  65m ISOVUE-370 IOPAMIDOL (ISOVUE-370) INJECTION 76% COMPARISON:  08/12/2017 and prior CTs FINDINGS: Lower chest: No acute abnormality. Chronic interstitial lung disease/fibrosis in the lung bases again noted. Hepatobiliary: The liver is unremarkable. The patient is status post cholecystectomy. CBD appears unchanged. Pancreas: A 1.5 cm cystic lesion at the junction of the pancreatic head/body is unchanged. The remainder of the pancreas is unremarkable. Spleen: Unremarkable Adrenals/Urinary Tract: Bladder wall thickening and perivesical inflammation is noted. There is a 2.5 x 2.5 x 2 cm fluid/gas collection between the sigmoid colon and bladder is unchanged in size from 08/31/2016. The kidneys and adrenal glands are unremarkable. Stomach/Bowel: Mildly distended proximal small bowel loops are noted without definite transition point. This Julie represent a mild ileus or partial bowel obstruction. Colonic diverticulosis again noted. A moderate to large hiatal hernia is identified. No definite bowel wall thickening noted. Vascular/Lymphatic: Aortic atherosclerosis. No enlarged abdominal or pelvic lymph nodes. Reproductive: Status post hysterectomy. No adnexal masses. Other: No ascites or pneumoperitoneum. Musculoskeletal: No acute or suspicious bony abnormalities. RIGHT femoral head AVN again noted. IMPRESSION: 1. Diffuse bladder wall thickening and perivesical inflammation suspicious for infection/cystitis. 2. 2.5 cm fluid/gas collection between the sigmoid colon and bladder and unchanged in size from 08/31/2016, probably representing chronic collection/diverticulum/sinus tract. 3. Unchanged pancreatic cystic lesion. 4. Bibasilar pulmonary fibrosis/interstitial lung disease. 5.  Aortic Atherosclerosis (ICD10-I70.0). Electronically Signed: By: JMargarette CanadaM.D. On: 02/09/2018 20:53    Scheduled Meds: . apixaban  5 mg Oral BID  . calcium-vitamin D  1 tablet Oral Daily  . cholestyramine  4 g Oral  BH-q7a  . docusate sodium  100 mg Oral BID  . famotidine  10 mg Oral Daily  . feeding supplement (ENSURE ENLIVE)  237 mL Oral BID BM  . FLUoxetine  20 mg Oral Daily  . furosemide  20 mg Oral Daily  . hydrALAZINE  10 mg Oral TID  . insulin aspart  0-5 Units Subcutaneous QHS  . insulin aspart  0-9 Units Subcutaneous TID WC  . levothyroxine  50 mcg Oral QAC breakfast  . losartan  100 mg Oral Daily  . pantoprazole  40 mg Oral Daily  . predniSONE  10 mg Oral Daily  . sotalol  80 mg Oral Q12H  . tadalafil (PAH)  40 mg Oral QHS  . Treprostinil  36 mcg Inhalation QID   Continuous Infusions: . meropenem (MERREM) IV      Assessment/Plan:  1. Acute cystitis with hematuria,  Leukocytosis.  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  Urine culture now growing E. Coli,  with hospital policy antibiotics  switched to meropenem.  Patient not interested in surgery at this time for fistula.  For this patient I would like to have urine culture sensitivities back prior to disposition. 2. 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 Chronic atrial  fibrillation on sotalol and Eliquis  4. hypothyroidism unspecified on levothyroxine 5.  essential hypertension on Cozaar 6. Depression on Prozac 7. Pulmonary hypertension continue current medications  Code Status:     Code Status Orders  (From admission, onward)        Start     Ordered   02/10/18 0146  Full code  Continuous     02/10/18 0145    Code Status History    Date Active Date Inactive Code Status Order ID Comments User Context   08/12/2017 0500 08/16/2017 1730 Full Code [id number]  Saundra Shelling, MD Inpatient   10/27/2016 0039 10/27/2016 1822 Full Code [id number]  Lance Coon, MD Inpatient   08/31/2016 2218 09/03/2016 1808 Full Code [id number]  Hubbard Robinson, MD ED   11/26/2015 0002 11/29/2015 2100 Full Code [id number]  Hillary Bow, MD ED   07/04/2015 1717 07/10/2015 2011 Full Code [id number]  Marlyce Huge, MD ED   03/25/2015 1947 03/29/2015 2044 Full Code [id number]  Vaughan Basta, MD Inpatient    Advance Directive Documentation     Most Recent Value  Type of Advance Directive  Healthcare Power of Attorney, Living will  Pre-existing out of facility DNR order (yellow form or pink MOST form)  -  "MOST" Form in Place?  -     Disposition Plan: Will need urine culture results prior to disposition  Antibiotics:  Meropenem  Time spent: 26 minutes  [place]

## 2018-02-12 LAB — URINE CULTURE: Culture: >=100000 — AB

## 2018-02-12 LAB — GLUCOSE, CAPILLARY
GLUCOSE-CAPILLARY: 92 mg/dL (ref 65–99)
Glucose-Capillary: 80 mg/dL (ref 65–99)

## 2018-02-12 MED ORDER — ACETAMINOPHEN 325 MG PO TABS: 650.0000 mg | ORAL_TABLET | Freq: Four times a day (QID) | ORAL | Status: DC | PRN

## 2018-02-12 MED ORDER — CEPHALEXIN 250 MG PO CAPS: ORAL_CAPSULE | ORAL | 0 refills | Status: DC

## 2018-02-12 MED ORDER — HYDROCODONE-ACETAMINOPHEN 5-325 MG PO TABS: 1.0000 | ORAL_TABLET | Freq: Four times a day (QID) | ORAL | 0 refills | Status: DC | PRN

## 2018-02-12 MED ORDER — CEPHALEXIN 500 MG PO CAPS: 500.0000 mg | ORAL_CAPSULE | Freq: Three times a day (TID) | ORAL | Status: DC

## 2018-02-12 MED ORDER — CEFTRIAXONE SODIUM 1 G IJ SOLR
1.0000 g | Freq: Once | INTRAMUSCULAR | Status: AC
  Administered 2018-02-12: 1 g via INTRAVENOUS
  Filled 2018-02-12: qty 1

## 2018-02-12 NOTE — Care Management Important Message (Signed)
Copy of signed IM left in patient's room.

## 2018-02-12 NOTE — Discharge Instructions (Signed)
Urinary Tract Infection, Adult A urinary tract infection (UTI) is an infection of any part of the urinary tract. The urinary tract includes the:  Kidneys.  Ureters.  Bladder.  Urethra.  These organs make, store, and get rid of pee (urine) in the body. Follow these instructions at home:  Take over-the-counter and prescription medicines only as told by your doctor.  If you were prescribed an antibiotic medicine, take it as told by your doctor. Do not stop taking the antibiotic even if you start to feel better.  Avoid the following drinks: ? Alcohol. ? Caffeine. ? Tea. ? Carbonated drinks.  Drink enough fluid to keep your pee clear or pale yellow.  Keep all follow-up visits as told by your doctor. This is important.  Make sure to: ? Empty your bladder often and completely. Do not to hold pee for long periods of time. ? Empty your bladder before and after sex. ? Wipe from front to back after a bowel movement if you are female. Use each tissue one time when you wipe. Contact a doctor if:  You have back pain.  You have a fever.  You feel sick to your stomach (nauseous).  You throw up (vomit).  Your symptoms do not get better after 3 days.  Your symptoms go away and then come back. Get help right away if:  You have very bad back pain.  You have very bad lower belly (abdominal) pain.  You are throwing up and cannot keep down any medicines or water. This information is not intended to replace advice given to you by your health care provider. Make sure you discuss any questions you have with your health care provider. Document Released: 03/05/2008 Document Revised: 02/23/2016 Document Reviewed: 08/08/2015 Elsevier Interactive Patient Education  [employer].

## 2018-02-12 NOTE — Discharge Summary (Signed)
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 A-fib ([place])   . Chronic diarrhea   . Diverticulitis   . GERD (gastroesophageal reflux disease)    usually takes protonix  . Hip fracture ([place]) 78/2017   hairline fracture on right.  no surgery, just physical therapy  . Hypertension   . IBS (irritable bowel syndrome)   . IBS (irritable bowel syndrome)   . Interstitial lung disease ([place])    does not remember when  . Pulmonary fibrosis ([place])   . Pulmonary hypertension ([place])   . Seasonal affective disorder ([place])   . Shortness of breath dyspnea    with exertion  . Type 2 diabetes mellitus ([place]) 11/04/2014    HOSPITAL COURSE:   1.  Acute cystitis with hematuria, leukocytosis.  The patient has a rectovesicular fistula.  Urine culture growing E. coli.  Give a dose of IV Rocephin prior to discharge today and sent home on p.o. Keflex 500 mg p.o. 3 times daily.  Since the patient does have a fistula I will send to home on phylactic antibiotics for UTI Keflex 250 mg daily after the completion of course of the UTI Keflex.  Patient does not want to do surgery for the fistula unless its urgent to do so.  Patient was seen by general surgery here in the hospital and they recommended a tertiary care center if she wanted this done.  I gave her the number of urology for outpatient. 2.  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  Chronic atrial fibrillation on sotalol and Eliquis 4.  Hypothyroidism  unspecified on levothyroxine 5.  Essential hypertension on Cozaar 6.  Depression on Prozac 7.  Pulmonary hypertension continue her usual medications  DISCHARGE CONDITIONS:   Satisfactory  CONSULTS OBTAINED:   General surgery  DRUG ALLERGIES:   Allergies  Allergen Reactions  . Amlodipine Swelling  . Nexium [Esomeprazole Magnesium] Diarrhea  . Aspartame Other (See Comments)    Patient reports "self diagnosed intolerance" to artificial sweeteners.    DISCHARGE MEDICATIONS:   Allergies as of 02/12/2018      Reactions   Amlodipine Swelling   Nexium [esomeprazole Magnesium] Diarrhea   Aspartame Other (See Comments)   Patient reports "self diagnosed intolerance" to artificial sweeteners.      Medication List    STOP taking these medications   ciprofloxacin 500 MG tablet Commonly known as:  CIPRO     TAKE these medications   acetaminophen 325 MG tablet Commonly known as:  TYLENOL Take 2 tablets (650 mg total) by mouth every 6 (six) hours as needed for mild pain (or Fever >/= 101).   ADCIRCA 20 MG tablet Generic drug:  tadalafil (PAH) Take 2 tablets daily by mouth.   CALCIUM 600+D 600-200 MG-UNIT Tabs Generic drug:  Calcium Carbonate-Vitamin D Take 1 tablet by mouth daily.   cephALEXin 250 MG capsule Commonly known as:  KEFLEX Two tablets three times a day for five more days then one tablet daily afterwards Start  taking on:  02/13/2018   cholestyramine 4 GM/DOSE powder Commonly known as:  QUESTRAN Take 4 g by mouth every morning.   ELIQUIS 5 MG Tabs tablet Generic drug:  apixaban Take 5 mg by mouth 2 (two) times daily.   feeding supplement (ENSURE ENLIVE) Liqd Take 237 mLs 2 (two) times daily between meals by mouth.   FLUoxetine 20 MG capsule Commonly known as:  PROZAC Take 20 mg by mouth daily.   furosemide 20 MG tablet Commonly known as:  LASIX Take 1 tablet daily by mouth.   HYDROcodone-acetaminophen 5-325 MG tablet Commonly known as:   NORCO/VICODIN Take 1 tablet by mouth every 6 (six) hours as needed for moderate pain.   levothyroxine 50 MCG tablet Commonly known as:  SYNTHROID, LEVOTHROID Take 50 mcg by mouth daily before breakfast.   losartan 100 MG tablet Commonly known as:  COZAAR Take 100 mg by mouth daily.   pantoprazole 40 MG tablet Commonly known as:  PROTONIX Take 1 tablet (40 mg total) by mouth daily.   predniSONE 10 MG tablet Commonly known as:  DELTASONE Take 10 mg by mouth daily.   ranitidine 300 MG tablet Commonly known as:  ZANTAC Take 300 mg by mouth at bedtime.   sotalol 80 MG tablet Commonly known as:  BETAPACE Take 1 tablet (80 mg total) by mouth every 12 (twelve) hours.   TYVASO 0.6 MG/ML Soln Generic drug:  Treprostinil Inhale 36 mcg 4 (four) times daily into the lungs. (6 breaths total)        DISCHARGE INSTRUCTIONS:   Follow-up PMD 6 days  If you experience worsening of your admission symptoms, develop shortness of breath, life threatening emergency, suicidal or homicidal thoughts you must seek medical attention immediately by calling 911 or calling your MD immediately  if symptoms less severe.  You Must read complete instructions/literature along with all the possible adverse reactions/side effects for all the Medicines you take and that have been prescribed to you. Take any new Medicines after you have completely understood and accept all the possible adverse reactions/side effects.   Please note  You were cared for by a hospitalist during your hospital stay. If you have any questions about your discharge medications or the care you received while you were in the hospital after you are discharged, you can call the unit and asked to speak with the hospitalist on call if the hospitalist that took care of you is not available. Once you are discharged, your primary care physician will handle any further medical issues. Please note that NO REFILLS for any discharge medications will  be authorized once you are discharged, as it is imperative that you return to your primary care physician (or establish a relationship with a primary care physician if you do not have one) for your aftercare needs so that they can reassess your need for medications and monitor your lab values.    Today   CHIEF COMPLAINT:   Chief Complaint  Patient presents with  . Fever  . Dysuria    HISTORY OF PRESENT ILLNESS:  Julie Mcmillan  is a 77 y.o. female presented with fever and burning on urination   VITAL SIGNS:  Blood pressure (!) 120/46, pulse 60, temperature 98.4 F (36.9 C), temperature source Oral, resp. rate 16, height _0  (1.626 m), weight 67.3 kg (148 lb 5.9 oz), SpO2 99 %.    PHYSICAL EXAMINATION:  GENERAL:  77 y.o.-year-old patient lying in the bed with no acute distress.  EYES:  Pupils equal, round, reactive to light and accommodation. No scleral icterus. Extraocular muscles intact.  HEENT: Head atraumatic, normocephalic. Oropharynx and nasopharynx clear.  NECK:  Supple, no jugular venous distention. No thyroid enlargement, no tenderness.  LUNGS: Normal breath sounds bilaterally,  positive rhonchi bilaterally. No use of accessory muscles of respiration.  CARDIOVASCULAR: S1, S2 normal. No murmurs, rubs, or gallops.  ABDOMEN: Soft, non-tender, non-distended. Bowel sounds present. No organomegaly or mass.  EXTREMITIES: No pedal edema, cyanosis, or clubbing.  NEUROLOGIC: Cranial nerves II through XII are intact. Muscle strength 5/5 in all extremities. Sensation intact. Gait not checked.  PSYCHIATRIC: The patient is alert and oriented x 3.  SKIN: No obvious rash, lesion, or ulcer.   DATA REVIEW:   CBC Recent Labs  Lab 02/11/18 0432  WBC 9.5  HGB 11.1*  HCT 33.1*  PLT 227    Chemistries  Recent Labs  Lab 02/09/18 1828 02/10/18 0449  NA 133* 137  K 4.3 4.3  CL 105 107  CO2 21* 25  GLUCOSE 136* 94  BUN 21* 16  CREATININE 1.01* 0.88  CALCIUM 8.6* 7.8*  AST  22  --   ALT 10*  --   ALKPHOS 55  --   BILITOT 0.7  --      Microbiology Results  Results for orders placed or performed during the hospital encounter of 02/09/18  Urine Culture     Status: Abnormal   Collection Time: 02/09/18  6:59 PM  Result Value Ref Range Status   Specimen Description   Final    URINE, RANDOM Performed at [facility], [address], [place], [place] [address]    Special Requests   Final    NONE Performed at [facility], [address] [place],  [address]    Culture >=100,000 COLONIES/mL ESCHERICHIA COLI (A)  Final   Report Status 02/12/2018 FINAL  Final   Organism ID, Bacteria ESCHERICHIA COLI (A)  Final      Susceptibility   Escherichia coli - MIC*    AMPICILLIN >=32 RESISTANT Resistant     CEFAZOLIN <=4 SENSITIVE Sensitive     CEFTRIAXONE <=1 SENSITIVE Sensitive     CIPROFLOXACIN >=4 RESISTANT Resistant     GENTAMICIN <=1 SENSITIVE Sensitive     IMIPENEM <=0.25 SENSITIVE Sensitive     NITROFURANTOIN <=16 SENSITIVE Sensitive     TRIMETH/SULFA <=20 SENSITIVE Sensitive     AMPICILLIN/SULBACTAM >=32 RESISTANT Resistant     PIP/TAZO <=4 SENSITIVE Sensitive     Extended ESBL NEGATIVE Sensitive     * >=100,000 COLONIES/mL ESCHERICHIA COLI     Management plans discussed with the patient, and she is in agreement.  CODE STATUS:     Code Status Orders  (From admission, onward)        Start     Ordered   02/10/18 0146  Full code  Continuous     02/10/18 0145    Code Status History    Date Active Date Inactive Code Status Order ID Comments User Context   08/12/2017 0500 08/16/2017 1730 Full Code [id number]  Saundra Shelling, MD Inpatient   10/27/2016 0039 10/27/2016 1822 Full Code [id number]  Lance Coon, MD Inpatient   08/31/2016 2218 09/03/2016 1808 Full Code [id number]  Hubbard Robinson, MD ED   11/26/2015 0002 11/29/2015 2100 Full Code [id number]  Hillary Bow, MD ED   07/04/2015 1717 07/10/2015 2011 Full Code  [id number]  Marlyce Huge, MD ED   03/25/2015 1947 03/29/2015 2044 Full Code  [id number]  Vaughan Basta, MD Inpatient    Advance Directive Documentation     Most Recent Value  Type of Advance Directive  Healthcare Power of Attorney, Living will  Pre-existing out of facility DNR order (yellow form or pink MOST form)  -  "MOST" Form in Place?  -      TOTAL TIME TAKING CARE OF THIS PATIENT: 35 minutes.    Loletha Grayer M.D on 02/12/2018 at 2:49 PM  Between 7am to 6pm - Pager - [phone]  After 6pm go to [email or web address] - password [email or web address] [facility]  [phone]  CC: Primary care physician; Glendon Axe, MD

## 2018-04-15 ENCOUNTER — Ambulatory Visit: Payer: Medicare HMO | Admitting: Urology

## 2018-05-20 ENCOUNTER — Other Ambulatory Visit: Payer: Self-pay | Admitting: Internal Medicine

## 2018-05-20 DIAGNOSIS — Z1231 Encounter for screening mammogram for malignant neoplasm of breast: Secondary | ICD-10-CM

## 2018-05-29 ENCOUNTER — Ambulatory Visit
Admission: RE | Admit: 2018-05-29 | Discharge: 2018-05-29 | Disposition: A | Discharge status: Home / Self Care | Payer: Medicare HMO | Source: Ambulatory Visit | Attending: Internal Medicine | Admitting: Internal Medicine

## 2018-05-29 DIAGNOSIS — Z1231 Encounter for screening mammogram for malignant neoplasm of breast: Secondary | ICD-10-CM | POA: Diagnosis not present

## 2018-05-29 HISTORY — DX: Malignant (primary) neoplasm, unspecified: C80.1

## 2018-06-05 ENCOUNTER — Other Ambulatory Visit: Payer: Self-pay | Admitting: *Deleted

## 2018-06-05 ENCOUNTER — Inpatient Hospital Stay
Admission: RE | Admit: 2018-06-05 | Discharge: 2018-06-05 | Disposition: A | Discharge status: Home / Self Care | Payer: Self-pay | Source: Ambulatory Visit | Attending: *Deleted | Admitting: *Deleted

## 2018-06-05 DIAGNOSIS — Z9289 Personal history of other medical treatment: Secondary | ICD-10-CM

## 2018-06-09 ENCOUNTER — Other Ambulatory Visit: Payer: Self-pay

## 2018-06-09 ENCOUNTER — Encounter: Payer: Medicare HMO | Attending: Internal Medicine

## 2018-06-09 VITALS — Ht 64.5 in | Wt 151.7 lb

## 2018-06-09 DIAGNOSIS — I272 Pulmonary hypertension, unspecified: Secondary | ICD-10-CM | POA: Diagnosis present

## 2018-06-09 DIAGNOSIS — J449 Chronic obstructive pulmonary disease, unspecified: Secondary | ICD-10-CM | POA: Diagnosis present

## 2018-06-09 NOTE — Progress Notes (Signed)
Pulmonary Individual Treatment Plan  Patient Details  Name: Julie Mcmillan MRN: [id number] Date of Birth: 15-May-1941 Referring Provider:     Pulmonary Rehab from 06/09/2018 in [facility] Cardiac and Pulmonary Rehab  Referring Provider  Rudean Hitt MD      Initial Encounter Date:    Pulmonary Rehab from 06/09/2018 in [facility] Cardiac and Pulmonary Rehab  Date  06/09/18      Visit Diagnosis: Pulmonary hypertension ([place])  Patient's Home Medications on Admission:  Current Outpatient Medications:  .  acetaminophen (TYLENOL) 325 MG tablet, Take 2 tablets (650 mg total) by mouth every 6 (six) hours as needed for mild pain (or Fever >/= 101)., Disp: , Rfl:  .  apixaban (ELIQUIS) 5 MG TABS tablet, Take 5 mg by mouth 2 (two) times daily., Disp: , Rfl:  .  Calcium Carbonate-Vitamin D (CALCIUM 600+D) 600-200 MG-UNIT TABS, Take 1 tablet by mouth daily., Disp: , Rfl:  .  cephALEXin (KEFLEX) 250 MG capsule, Two tablets three times a day for five more days then one tablet daily afterwards, Disp: 55 capsule, Rfl: 0 .  cholestyramine (QUESTRAN) 4 GM/DOSE powder, Take 4 g by mouth every morning. , Disp: , Rfl:  .  feeding supplement, ENSURE ENLIVE, (ENSURE ENLIVE) LIQD, Take 237 mLs 2 (two) times daily between meals by mouth. (Patient not taking: Reported on 02/09/2018), Disp: 237 mL, Rfl: 12 .  FLUoxetine (PROZAC) 20 MG capsule, Take 20 mg by mouth daily., Disp: , Rfl:  .  furosemide (LASIX) 20 MG tablet, Take 1 tablet daily by mouth., Disp: , Rfl:  .  HYDROcodone-acetaminophen (NORCO/VICODIN) 5-325 MG tablet, Take 1 tablet by mouth every 6 (six) hours as needed for moderate pain., Disp: 10 tablet, Rfl: 0 .  levothyroxine (SYNTHROID, LEVOTHROID) 50 MCG tablet, Take 50 mcg by mouth daily before breakfast., Disp: , Rfl:  .  losartan (COZAAR) 100 MG tablet, Take 100 mg by mouth daily. , Disp: , Rfl:  .  pantoprazole (PROTONIX) 40 MG tablet, Take 1 tablet (40 mg total) by mouth daily., Disp: 90 tablet, Rfl: 1 .   predniSONE (DELTASONE) 10 MG tablet, Take 10 mg by mouth daily., Disp: , Rfl:  .  ranitidine (ZANTAC) 300 MG tablet, Take 300 mg by mouth at bedtime. , Disp: , Rfl:  .  sotalol (BETAPACE) 80 MG tablet, Take 1 tablet (80 mg total) by mouth every 12 (twelve) hours., Disp: 60 tablet, Rfl: 0 .  tadalafil, PAH, (ADCIRCA) 20 MG tablet, Take 2 tablets daily by mouth., Disp: , Rfl:   Past Medical History: Past Medical History:  Diagnosis Date  . A-fib ([place])   . Cancer ([place])    skin  . Chronic diarrhea   . Diverticulitis   . GERD (gastroesophageal reflux disease)    usually takes protonix  . Hip fracture ([place]) 11/2015   hairline fracture on right.  no surgery, just physical therapy  . Hypertension   . IBS (irritable bowel syndrome)   . IBS (irritable bowel syndrome)   . Interstitial lung disease ([place])    does not remember when  . Pulmonary fibrosis ([place])   . Pulmonary hypertension ([place])   . Seasonal affective disorder ([place])   . Shortness of breath dyspnea    with exertion  . Type 2 diabetes mellitus ([place]) 11/04/2014    Tobacco Use: Social History   Tobacco Use  Smoking Status Never Smoker  Smokeless Tobacco Never Used  Tobacco Comment   no passive smokers in home  Labs: Recent Review Flowsheet Data    Labs for ITP Cardiac and Pulmonary Rehab Latest Ref Rng & Units 10/27/2016   Hemoglobin A1c 4.8 - 5.6 % 5.3       Pulmonary Assessment Scores: Pulmonary Assessment Scores    Row Name 06/09/18 1430         ADL UCSD   ADL Phase  Entry     SOB Score total  66     Rest  0     Walk  1     Stairs  4     Bath  4     Dress  3     Shop  3       CAT Score   CAT Score  24       mMRC Score   mMRC Score  2        Pulmonary Function Assessment: Pulmonary Function Assessment - 06/09/18 1447      Breath   Bilateral Breath Sounds  Clear;Decreased    Shortness of Breath  Yes;Fear of Shortness of Breath;Limiting activity       Exercise Target Goals: Exercise Program  Goal: Individual exercise prescription set using results from initial 6 min walk test and THRR while considering  patient's activity barriers and safety.   Exercise Prescription Goal: Initial exercise prescription builds to 30-45 minutes a day of aerobic activity, 2-3 days per week.  Home exercise guidelines will be given to patient during program as part of exercise prescription that the participant will acknowledge.  Activity Barriers & Risk Stratification: Activity Barriers & Cardiac Risk Stratification - 06/09/18 1541      Activity Barriers & Cardiac Risk Stratification   Activity Barriers  Joint Problems;Other (comment);History of Falls;Balance Concerns;Shortness of Breath;Deconditioning;Muscular Weakness;Assistive Device    Comments  R shoulder dislocated from fall Nov 2018, has to carry oxygen on left side, uses walker for longer distances       6 Minute Walk: 6 Minute Walk    Row Name 06/09/18 1538         6 Minute Walk   Phase  Initial     Distance  770 feet     Walk Time  6 minutes     # of Rest Breaks  0     MPH  1.46     METS  1.5     RPE  13     Perceived Dyspnea   3     VO2 Peak  5.24     Symptoms  Yes (comment)     Comments  tired, using rolling walker for support     Resting HR  58 bpm     Resting BP  146/84     Resting Oxygen Saturation   95 %     Exercise Oxygen Saturation  during 6 min walk  83 %     Max Ex. HR  81 bpm     Max Ex. BP  146/86     2 Minute Post BP  142/80       Interval HR   1 Minute HR  73     2 Minute HR  79     3 Minute HR  79     4 Minute HR  81     5 Minute HR  80     6 Minute HR  80     2 Minute Post HR  63     Interval Heart Rate?  Yes  Interval Oxygen   Interval Oxygen?  Yes     Baseline Oxygen Saturation %  95 %     1 Minute Oxygen Saturation %  89 %     1 Minute Liters of Oxygen  3 L pulsed     2 Minute Oxygen Saturation %  86 %     2 Minute Liters of Oxygen  3 L     3 Minute Oxygen Saturation %  85 %     3  Minute Liters of Oxygen  3 L     4 Minute Oxygen Saturation %  84 %     4 Minute Liters of Oxygen  3 L     5 Minute Oxygen Saturation %  84 %     5 Minute Liters of Oxygen  3 L     6 Minute Oxygen Saturation %  83 %     6 Minute Liters of Oxygen  3 L     2 Minute Post Oxygen Saturation %  94 %     2 Minute Post Liters of Oxygen  3 L       Oxygen Initial Assessment: Oxygen Initial Assessment - 06/09/18 1445      Home Oxygen   Home Oxygen Device  Home Concentrator;Portable Concentrator;E-Tanks    Sleep Oxygen Prescription  Continuous    Liters per minute  2    Home Exercise Oxygen Prescription  Continuous    Liters per minute  3    Home at Rest Exercise Oxygen Prescription  Continuous    Liters per minute  2    Compliance with Home Oxygen Use  Yes      Initial 6 min Walk   Oxygen Used  Pulsed;Portable Concentrator    Liters per minute  3      Program Oxygen Prescription   Program Oxygen Prescription  Continuous    Liters per minute  3      Intervention   Short Term Goals  To learn and exhibit compliance with exercise, home and travel O2 prescription;To learn and understand importance of monitoring SPO2 with pulse oximeter and demonstrate accurate use of the pulse oximeter.;To learn and understand importance of maintaining oxygen saturations>88%;To learn and demonstrate proper pursed lip breathing techniques or other breathing techniques.;To learn and demonstrate proper use of respiratory medications    Long  Term Goals  Exhibits compliance with exercise, home and travel O2 prescription;Verbalizes importance of monitoring SPO2 with pulse oximeter and return demonstration;Maintenance of O2 saturations>88%;Exhibits proper breathing techniques, such as pursed lip breathing or other method taught during program session;Compliance with respiratory medication;Demonstrates proper use of MDI's       Oxygen Re-Evaluation:   Oxygen Discharge (Final Oxygen Re-Evaluation):   Initial  Exercise Prescription: Initial Exercise Prescription - 06/09/18 1500      Date of Initial Exercise RX and Referring Provider   Date  06/09/18    Referring Provider  Rudean Hitt MD      Oxygen   Oxygen  Continuous      Treadmill   MPH  1.3    Grade  0    Minutes  15    METs  2      NuStep   Level  2    SPM  80    Minutes  15    METs  2      Recumbant Elliptical   Level  1    RPM  50    Minutes  15  METs  2      Prescription Details   Frequency (times per week)  3    Duration  Progress to 45 minutes of aerobic exercise without signs/symptoms of physical distress      Intensity   THRR 40-80% of Max Heartrate  92-125    Ratings of Perceived Exertion  11-13    Perceived Dyspnea  0-4      Progression   Progression  Continue to progress workloads to maintain intensity without signs/symptoms of physical distress.      Resistance Training   Training Prescription  Yes    Weight  3 lbs    Reps  10-15       Perform Capillary Blood Glucose checks as needed.  Exercise Prescription Changes: Exercise Prescription Changes    Row Name 06/09/18 1500             Response to Exercise   Blood Pressure (Admit)  146/84       Blood Pressure (Exercise)  146/86       Blood Pressure (Exit)  142/80       Heart Rate (Admit)  58 bpm       Heart Rate (Exercise)  81 bpm       Heart Rate (Exit)  66 bpm       Oxygen Saturation (Admit)  95 %       Oxygen Saturation (Exercise)  83 %       Oxygen Saturation (Exit)  94 %       Rating of Perceived Exertion (Exercise)  13       Perceived Dyspnea (Exercise)  3       Symptoms  tired, used rolling walker       Comments  walk test results          Exercise Comments:   Exercise Goals and Review: Exercise Goals    Row Name 06/09/18 1546             Exercise Goals   Increase Physical Activity  Yes       Intervention  Provide advice, education, support and counseling about physical activity/exercise needs.;Develop an  individualized exercise prescription for aerobic and resistive training based on initial evaluation findings, risk stratification, comorbidities and participant's personal goals.       Expected Outcomes  Short Term: Attend rehab on a regular basis to increase amount of physical activity.;Long Term: Add in home exercise to make exercise part of routine and to increase amount of physical activity.;Long Term: Exercising regularly at least 3-5 days a week.       Increase Strength and Stamina  Yes       Intervention  Provide advice, education, support and counseling about physical activity/exercise needs.;Develop an individualized exercise prescription for aerobic and resistive training based on initial evaluation findings, risk stratification, comorbidities and participant's personal goals.       Expected Outcomes  Short Term: Increase workloads from initial exercise prescription for resistance, speed, and METs.;Short Term: Perform resistance training exercises routinely during rehab and add in resistance training at home;Long Term: Improve cardiorespiratory fitness, muscular endurance and strength as measured by increased METs and functional capacity (6MWT)       Able to understand and use rate of perceived exertion (RPE) scale  Yes       Intervention  Provide education and explanation on how to use RPE scale       Expected Outcomes  Short Term: Able to use RPE daily in  rehab to express subjective intensity level;Long Term:  Able to use RPE to guide intensity level when exercising independently       Able to understand and use Dyspnea scale  Yes       Intervention  Provide education and explanation on how to use Dyspnea scale       Expected Outcomes  Short Term: Able to use Dyspnea scale daily in rehab to express subjective sense of shortness of breath during exertion;Long Term: Able to use Dyspnea scale to guide intensity level when exercising independently       Knowledge and understanding of Target Heart  Rate Range (THRR)  Yes       Intervention  Provide education and explanation of THRR including how the numbers were predicted and where they are located for reference       Expected Outcomes  Short Term: Able to state/look up THRR;Short Term: Able to use daily as guideline for intensity in rehab;Long Term: Able to use THRR to govern intensity when exercising independently       Able to check pulse independently  Yes       Intervention  Provide education and demonstration on how to check pulse in carotid and radial arteries.;Review the importance of being able to check your own pulse for safety during independent exercise       Expected Outcomes  Short Term: Able to explain why pulse checking is important during independent exercise;Long Term: Able to check pulse independently and accurately       Understanding of Exercise Prescription  Yes       Intervention  Provide education, explanation, and written materials on patient's individual exercise prescription       Expected Outcomes  Short Term: Able to explain program exercise prescription;Long Term: Able to explain home exercise prescription to exercise independently          Exercise Goals Re-Evaluation :   Discharge Exercise Prescription (Final Exercise Prescription Changes): Exercise Prescription Changes - 06/09/18 1500      Response to Exercise   Blood Pressure (Admit)  146/84    Blood Pressure (Exercise)  146/86    Blood Pressure (Exit)  142/80    Heart Rate (Admit)  58 bpm    Heart Rate (Exercise)  81 bpm    Heart Rate (Exit)  66 bpm    Oxygen Saturation (Admit)  95 %    Oxygen Saturation (Exercise)  83 %    Oxygen Saturation (Exit)  94 %    Rating of Perceived Exertion (Exercise)  13    Perceived Dyspnea (Exercise)  3    Symptoms  tired, used rolling walker    Comments  walk test results       Nutrition:  Target Goals: Understanding of nutrition guidelines, daily intake of sodium <1579m, cholesterol <2085m calories 30%  from fat and 7% or less from saturated fats, daily to have 5 or more servings of fruits and vegetables.  Biometrics: Pre Biometrics - 06/09/18 1546      Pre Biometrics   Height  5' 4.5" (1.638 m)    Weight  151 lb 11.2 oz (68.8 kg)    Waist Circumference  34.5 inches    Hip Circumference  41 inches    Waist to Hip Ratio  0.84 %    BMI (Calculated)  25.65    Single Leg Stand  0 seconds        Nutrition Therapy Plan and Nutrition Goals: Nutrition Therapy & Goals -  06/09/18 1453      Personal Nutrition Goals   Nutrition Goal  Patient would like to maintain her weight.    Personal Goal #2  She wants to learn how to eat healthier.      Intervention Plan   Intervention  Prescribe, educate and counsel regarding individualized specific dietary modifications aiming towards targeted core components such as weight, hypertension, lipid management, diabetes, heart failure and other comorbidities.;Nutrition handout(s) given to patient.    Expected Outcomes  Short Term Goal: Understand basic principles of dietary content, such as calories, fat, sodium, cholesterol and nutrients.;Long Term Goal: Adherence to prescribed nutrition plan.       Nutrition Assessments: Nutrition Assessments - 06/09/18 1433      MEDFICTS Scores   Pre Score  54       Nutrition Goals Re-Evaluation:   Nutrition Goals Discharge (Final Nutrition Goals Re-Evaluation):   Psychosocial: Target Goals: Acknowledge presence or absence of significant depression and/or stress, maximize coping skills, provide positive support system. Participant is able to verbalize types and ability to use techniques and skills needed for reducing stress and depression.   Initial Review & Psychosocial Screening: Initial Psych Review & Screening - 06/09/18 1448      Initial Review   Current issues with  Current Sleep Concerns;Current Stress Concerns;Current Anxiety/Panic    Source of Stress Concerns  Chronic Illness    Comments  Her  fibrosis is stressing her out and she is trying to work on her anxiety. She is worried about the "END" and has been sad. She is taking diltiazem to help her with her heart .      Family Dynamics   Good Support System?  Yes    Comments  She can look to her two sons for support. She talks to her sons about every other day.       Barriers   Psychosocial barriers to participate in program  The patient should benefit from training in stress management and relaxation.      Screening Interventions   Interventions  Encouraged to exercise;To provide support and resources with identified psychosocial needs;Program counselor consult;Provide feedback about the scores to participant    Expected Outcomes  Short Term goal: Utilizing psychosocial counselor, staff and physician to assist with identification of specific Stressors or current issues interfering with healing process. Setting desired goal for each stressor or current issue identified.;Long Term Goal: Stressors or current issues are controlled or eliminated.;Short Term goal: Identification and review with participant of any Quality of Life or Depression concerns found by scoring the questionnaire.;Long Term goal: The participant improves quality of Life and PHQ9 Scores as seen by post scores and/or verbalization of changes       Quality of Life Scores:  Scores of 19 and below usually indicate a poorer quality of life in these areas.  A difference of  2-3 points is a clinically meaningful difference.  A difference of 2-3 points in the total score of the Quality of Life Index has been associated with significant improvement in overall quality of life, self-image, physical symptoms, and general health in studies assessing change in quality of life.  PHQ-9: Recent Review Flowsheet Data    Depression screen [facility] 2/9 06/09/2018   Decreased Interest 1   Down, Depressed, Hopeless 0   PHQ - 2 Score 1   Altered sleeping 2   Tired, decreased energy 2   Change  in appetite 2   Feeling bad or failure about yourself  0  Trouble concentrating 0   Moving slowly or fidgety/restless 0   Suicidal thoughts 0   PHQ-9 Score 7   Difficult doing work/chores Somewhat difficult     Interpretation of Total Score  Total Score Depression Severity:  1-4 = Minimal depression, 5-9 = Mild depression, 10-14 = Moderate depression, 15-19 = Moderately severe depression, 20-27 = Severe depression   Psychosocial Evaluation and Intervention:   Psychosocial Re-Evaluation:   Psychosocial Discharge (Final Psychosocial Re-Evaluation):   Education: Education Goals: Education classes will be provided on a weekly basis, covering required topics. Participant will state understanding/return demonstration of topics presented.  Learning Barriers/Preferences: Learning Barriers/Preferences - 06/09/18 1455      Learning Barriers/Preferences   Learning Barriers  None    Learning Preferences  None       Education Topics:  Initial Evaluation Education: - Verbal, written and demonstration of respiratory meds, oximetry and breathing techniques. Instruction on use of nebulizers and MDIs and importance of monitoring MDI activations.   Pulmonary Rehab from 06/09/2018 in [facility] Cardiac and Pulmonary Rehab  Date  06/09/18  Educator  [facility]  Instruction Review Code  1- Verbalizes Understanding      General Nutrition Guidelines/Fats and Fiber: -Group instruction provided by verbal, written material, models and posters to present the general guidelines for heart healthy nutrition. Gives an explanation and review of dietary fats and fiber.   Controlling Sodium/Reading Food Labels: -Group verbal and written material supporting the discussion of sodium use in heart healthy nutrition. Review and explanation with models, verbal and written materials for utilization of the food label.   Exercise Physiology & General Exercise Guidelines: - Group verbal and written instruction with models  to review the exercise physiology of the cardiovascular system and associated critical values. Provides general exercise guidelines with specific guidelines to those with heart or lung disease.    Aerobic Exercise & Resistance Training: - Gives group verbal and written instruction on the various components of exercise. Focuses on aerobic and resistive training programs and the benefits of this training and how to safely progress through these programs.   Flexibility, Balance, Mind/Body Relaxation: Provides group verbal/written instruction on the benefits of flexibility and balance training, including mind/body exercise modes such as yoga, pilates and tai chi.  Demonstration and skill practice provided.   Stress and Anxiety: - Provides group verbal and written instruction about the health risks of elevated stress and causes of high stress.  Discuss the correlation between heart/lung disease and anxiety and treatment options. Review healthy ways to manage with stress and anxiety.   Depression: - Provides group verbal and written instruction on the correlation between heart/lung disease and depressed mood, treatment options, and the stigmas associated with seeking treatment.   Exercise & Equipment Safety: - Individual verbal instruction and demonstration of equipment use and safety with use of the equipment.   Pulmonary Rehab from 06/09/2018 in [facility] Cardiac and Pulmonary Rehab  Date  06/09/18  Educator  [facility]  Instruction Review Code  1- Verbalizes Understanding      Infection Prevention: - Provides verbal and written material to individual with discussion of infection control including proper hand washing and proper equipment cleaning during exercise session.   Pulmonary Rehab from 06/09/2018 in [facility] Cardiac and Pulmonary Rehab  Date  06/09/18  Educator  [facility]  Instruction Review Code  1- Verbalizes Understanding      Falls Prevention: - Provides verbal and written material to individual  with discussion of falls prevention and safety.  Pulmonary Rehab from 06/09/2018 in [facility] Cardiac and Pulmonary Rehab  Date  06/09/18  Educator  [facility]  Instruction Review Code  1- Verbalizes Understanding      Diabetes: - Individual verbal and written instruction to review signs/symptoms of diabetes, desired ranges of glucose level fasting, after meals and with exercise. Advice that pre and post exercise glucose checks will be done for 3 sessions at entry of program.   Chronic Lung Diseases: - Group verbal and written instruction to review updates, respiratory medications, advancements in procedures and treatments. Discuss use of supplemental oxygen including available portable oxygen systems, continuous and intermittent flow rates, concentrators, personal use and safety guidelines. Review proper use of inhaler and spacers. Provide informative websites for self-education.    Energy Conservation: - Provide group verbal and written instruction for methods to conserve energy, plan and organize activities. Instruct on pacing techniques, use of adaptive equipment and posture/positioning to relieve shortness of breath.   Triggers and Exacerbations: - Group verbal and written instruction to review types of environmental triggers and ways to prevent exacerbations. Discuss weather changes, air quality and the benefits of nasal washing. Review warning signs and symptoms to help prevent infections. Discuss techniques for effective airway clearance, coughing, and vibrations.   AED/CPR: - Group verbal and written instruction with the use of models to demonstrate the basic use of the AED with the basic ABC's of resuscitation.   Anatomy and Physiology of the Lungs: - Group verbal and written instruction with the use of models to provide basic lung anatomy and physiology related to function, structure and complications of lung disease.   Anatomy & Physiology of the Heart: - Group verbal and written  instruction and models provide basic cardiac anatomy and physiology, with the coronary electrical and arterial systems. Review of Valvular disease and Heart Failure   Cardiac Medications: - Group verbal and written instruction to review commonly prescribed medications for heart disease. Reviews the medication, class of the drug, and side effects.   Know Your Numbers and Risk Factors: -Group verbal and written instruction about important numbers in your health.  Discussion of what are risk factors and how they play a role in the disease process.  Review of Cholesterol, Blood Pressure, Diabetes, and BMI and the role they play in your overall health.   Sleep Hygiene: -Provides group verbal and written instruction about how sleep can affect your health.  Define sleep hygiene, discuss sleep cycles and impact of sleep habits. Review good sleep hygiene tips.    Other: -Provides group and verbal instruction on various topics (see comments)    Knowledge Questionnaire Score: Knowledge Questionnaire Score - 06/09/18 1432      Knowledge Questionnaire Score   Pre Score  16/18   Reviewed with patient       Core Components/Risk Factors/Patient Goals at Admission: Personal Goals and Risk Factors at Admission - 06/09/18 1456      Core Components/Risk Factors/Patient Goals on Admission    Weight Management  Yes;Weight Loss    Intervention  Weight Management: Develop a combined nutrition and exercise program designed to reach desired caloric intake, while maintaining appropriate intake of nutrient and fiber, sodium and fats, and appropriate energy expenditure required for the weight goal.;Weight Management: Provide education and appropriate resources to help participant work on and attain dietary goals.;Weight Management/Obesity: Establish reasonable short term and long term weight goals.    Admit Weight  151 lb 11.2 oz (68.8 kg)    Goal Weight: Short Term  146 lb (66.2 kg)    Goal Weight: Long  Term  141 lb (64 kg)    Expected Outcomes  Short Term: Continue to assess and modify interventions until short term weight is achieved;Long Term: Adherence to nutrition and physical activity/exercise program aimed toward attainment of established weight goal;Understanding recommendations for meals to include 15-35% energy as protein, 25-35% energy from fat, 35-60% energy from carbohydrates, less than 224m of dietary cholesterol, 20-35 gm of total fiber daily;Understanding of distribution of calorie intake throughout the day with the consumption of 4-5 meals/snacks;Weight Loss: Understanding of general recommendations for a balanced deficit meal plan, which promotes 1-2 lb weight loss per week and includes a negative energy balance of [phone] kcal/d    Improve shortness of breath with ADL's  Yes    Intervention  Provide education, individualized exercise plan and daily activity instruction to help decrease symptoms of SOB with activities of daily living.    Expected Outcomes  Short Term: Improve cardiorespiratory fitness to achieve a reduction of symptoms when performing ADLs;Long Term: Be able to perform more ADLs without symptoms or delay the onset of symptoms    Diabetes  Yes   states she does not but has been diagnosed before   Intervention  Provide education about signs/symptoms and action to take for hypo/hyperglycemia.;Provide education about proper nutrition, including hydration, and aerobic/resistive exercise prescription along with prescribed medications to achieve blood glucose in normal ranges: Fasting glucose 65-99 mg/dL    Expected Outcomes  Long Term: Attainment of HbA1C < 7%.;Short Term: Participant verbalizes understanding of the signs/symptoms and immediate care of hyper/hypoglycemia, proper foot care and importance of medication, aerobic/resistive exercise and nutrition plan for blood glucose control.    Hypertension  Yes    Intervention  Monitor prescription use compliance.;Provide  education on lifestyle modifcations including regular physical activity/exercise, weight management, moderate sodium restriction and increased consumption of fresh fruit, vegetables, and low fat dairy, alcohol moderation, and smoking cessation.    Expected Outcomes  Short Term: Continued assessment and intervention until BP is < 140/959mHG in hypertensive participants. < 130/8021mG in hypertensive participants with diabetes, heart failure or chronic kidney disease.;Long Term: Maintenance of blood pressure at goal levels.       Core Components/Risk Factors/Patient Goals Review:    Core Components/Risk Factors/Patient Goals at Discharge (Final Review):    ITP Comments: ITP Comments    Row Name 06/09/18 1413           ITP Comments  Medical Evaluation completed. Chart sent for review and changes to Dr. [place]          Comments: Initial ITP

## 2018-06-09 NOTE — Patient Instructions (Signed)
Patient Instructions  Patient Details  Name: Julie Mcmillan MRN: [id number] Date of Birth: 05-06-1941 Referring Provider:  Leighton Ruff, MD  Below are your personal goals for exercise, nutrition, and risk factors. Our goal is to help you stay on track towards obtaining and maintaining these goals. We will be discussing your progress on these goals with you throughout the program.  Initial Exercise Prescription: Initial Exercise Prescription - 06/09/18 1500      Date of Initial Exercise RX and Referring Provider   Date  06/09/18    Referring Provider  Rudean Hitt MD      Oxygen   Oxygen  Continuous      Treadmill   MPH  1.3    Grade  0    Minutes  15    METs  2      NuStep   Level  2    SPM  80    Minutes  15    METs  2      Recumbant Elliptical   Level  1    RPM  50    Minutes  15    METs  2      Prescription Details   Frequency (times per week)  3    Duration  Progress to 45 minutes of aerobic exercise without signs/symptoms of physical distress      Intensity   THRR 40-80% of Max Heartrate  92-125    Ratings of Perceived Exertion  11-13    Perceived Dyspnea  0-4      Progression   Progression  Continue to progress workloads to maintain intensity without signs/symptoms of physical distress.      Resistance Training   Training Prescription  Yes    Weight  3 lbs    Reps  10-15       Exercise Goals: Frequency: Be able to perform aerobic exercise two to three times per week in program working toward 2-5 days per week of home exercise.  Intensity: Work with a perceived exertion of 11 (fairly light) - 15 (hard) while following your exercise prescription.  We will make changes to your prescription with you as you progress through the program.   Duration: Be able to do 30 to 45 minutes of continuous aerobic exercise in addition to a 5 minute warm-up and a 5 minute cool-down routine.   Nutrition Goals: Your personal nutrition goals will be established  when you do your nutrition analysis with the dietician.  The following are general nutrition guidelines to follow: Cholesterol < 272m/day Sodium < 15080mday Fiber: Women over 50 yrs - 21 grams per day  Personal Goals: Personal Goals and Risk Factors at Admission - 06/09/18 1456      Core Components/Risk Factors/Patient Goals on Admission    Weight Management  Yes;Weight Loss    Intervention  Weight Management: Develop a combined nutrition and exercise program designed to reach desired caloric intake, while maintaining appropriate intake of nutrient and fiber, sodium and fats, and appropriate energy expenditure required for the weight goal.;Weight Management: Provide education and appropriate resources to help participant work on and attain dietary goals.;Weight Management/Obesity: Establish reasonable short term and long term weight goals.    Admit Weight  151 lb 11.2 oz (68.8 kg)    Goal Weight: Short Term  146 lb (66.2 kg)    Goal Weight: Long Term  141 lb (64 kg)    Expected Outcomes  Short Term: Continue to assess and modify interventions until  short term weight is achieved;Long Term: Adherence to nutrition and physical activity/exercise program aimed toward attainment of established weight goal;Understanding recommendations for meals to include 15-35% energy as protein, 25-35% energy from fat, 35-60% energy from carbohydrates, less than 222m of dietary cholesterol, 20-35 gm of total fiber daily;Understanding of distribution of calorie intake throughout the day with the consumption of 4-5 meals/snacks;Weight Loss: Understanding of general recommendations for a balanced deficit meal plan, which promotes 1-2 lb weight loss per week and includes a negative energy balance of [phone] kcal/d    Improve shortness of breath with ADL's  Yes    Intervention  Provide education, individualized exercise plan and daily activity instruction to help decrease symptoms of SOB with activities of daily living.     Expected Outcomes  Short Term: Improve cardiorespiratory fitness to achieve a reduction of symptoms when performing ADLs;Long Term: Be able to perform more ADLs without symptoms or delay the onset of symptoms    Diabetes  Yes   states she does not but has been diagnosed before   Intervention  Provide education about signs/symptoms and action to take for hypo/hyperglycemia.;Provide education about proper nutrition, including hydration, and aerobic/resistive exercise prescription along with prescribed medications to achieve blood glucose in normal ranges: Fasting glucose 65-99 mg/dL    Expected Outcomes  Long Term: Attainment of HbA1C < 7%.;Short Term: Participant verbalizes understanding of the signs/symptoms and immediate care of hyper/hypoglycemia, proper foot care and importance of medication, aerobic/resistive exercise and nutrition plan for blood glucose control.    Hypertension  Yes    Intervention  Monitor prescription use compliance.;Provide education on lifestyle modifcations including regular physical activity/exercise, weight management, moderate sodium restriction and increased consumption of fresh fruit, vegetables, and low fat dairy, alcohol moderation, and smoking cessation.    Expected Outcomes  Short Term: Continued assessment and intervention until BP is < 140/987mHG in hypertensive participants. < 130/8057mG in hypertensive participants with diabetes, heart failure or chronic kidney disease.;Long Term: Maintenance of blood pressure at goal levels.       Tobacco Use Initial Evaluation: Social History   Tobacco Use  Smoking Status Never Smoker  Smokeless Tobacco Never Used  Tobacco Comment   no passive smokers in home    Exercise Goals and Review: Exercise Goals    Row Name 06/09/18 1546             Exercise Goals   Increase Physical Activity  Yes       Intervention  Provide advice, education, support and counseling about physical activity/exercise needs.;Develop  an individualized exercise prescription for aerobic and resistive training based on initial evaluation findings, risk stratification, comorbidities and participant's personal goals.       Expected Outcomes  Short Term: Attend rehab on a regular basis to increase amount of physical activity.;Long Term: Add in home exercise to make exercise part of routine and to increase amount of physical activity.;Long Term: Exercising regularly at least 3-5 days a week.       Increase Strength and Stamina  Yes       Intervention  Provide advice, education, support and counseling about physical activity/exercise needs.;Develop an individualized exercise prescription for aerobic and resistive training based on initial evaluation findings, risk stratification, comorbidities and participant's personal goals.       Expected Outcomes  Short Term: Increase workloads from initial exercise prescription for resistance, speed, and METs.;Short Term: Perform resistance training exercises routinely during rehab and add in resistance training at home;Long Term: Improve  cardiorespiratory fitness, muscular endurance and strength as measured by increased METs and functional capacity (6MWT)       Able to understand and use rate of perceived exertion (RPE) scale  Yes       Intervention  Provide education and explanation on how to use RPE scale       Expected Outcomes  Short Term: Able to use RPE daily in rehab to express subjective intensity level;Long Term:  Able to use RPE to guide intensity level when exercising independently       Able to understand and use Dyspnea scale  Yes       Intervention  Provide education and explanation on how to use Dyspnea scale       Expected Outcomes  Short Term: Able to use Dyspnea scale daily in rehab to express subjective sense of shortness of breath during exertion;Long Term: Able to use Dyspnea scale to guide intensity level when exercising independently       Knowledge and understanding of Target  Heart Rate Range (THRR)  Yes       Intervention  Provide education and explanation of THRR including how the numbers were predicted and where they are located for reference       Expected Outcomes  Short Term: Able to state/look up THRR;Short Term: Able to use daily as guideline for intensity in rehab;Long Term: Able to use THRR to govern intensity when exercising independently       Able to check pulse independently  Yes       Intervention  Provide education and demonstration on how to check pulse in carotid and radial arteries.;Review the importance of being able to check your own pulse for safety during independent exercise       Expected Outcomes  Short Term: Able to explain why pulse checking is important during independent exercise;Long Term: Able to check pulse independently and accurately       Understanding of Exercise Prescription  Yes       Intervention  Provide education, explanation, and written materials on patient's individual exercise prescription       Expected Outcomes  Short Term: Able to explain program exercise prescription;Long Term: Able to explain home exercise prescription to exercise independently          Copy of goals given to participant.

## 2018-06-09 NOTE — Progress Notes (Signed)
Daily Session Note  Patient Details  Name: Julie Mcmillan MRN: [id number] Date of Birth: [birth year] Referring Provider:     Pulmonary Rehab from 06/09/2018 in [facility] Cardiac and Pulmonary Rehab  Referring Provider  Rudean Hitt MD      Encounter Date: 06/09/2018  Check In: Session Check In - 06/09/18 1412      Check-In   Supervising physician immediately available to respond to emergencies  [facility] immediately available ER MD    Physician(s)  Dr. Joni Fears and [facility]    Location  [facility]-Cardiac & Pulmonary Rehab    Staff Present  Justin Mend RCP,RRT,BSRT;Jessica Luan Pulling, [place], RCEP, CCRP, Exercise Physiologist    Medication changes reported      No    Fall or balance concerns reported     No    Warm-up and Cool-down  Not performed (comment)   medical evaluation   Resistance Training Performed  No    VAD Patient?  No      Pain Assessment   Currently in Pain?  No/denies        Exercise Prescription Changes - 06/09/18 1500      Response to Exercise   Blood Pressure (Admit)  146/84    Blood Pressure (Exercise)  146/86    Blood Pressure (Exit)  142/80    Heart Rate (Admit)  58 bpm    Heart Rate (Exercise)  81 bpm    Heart Rate (Exit)  66 bpm    Oxygen Saturation (Admit)  95 %    Oxygen Saturation (Exercise)  83 %    Oxygen Saturation (Exit)  94 %    Rating of Perceived Exertion (Exercise)  13    Perceived Dyspnea (Exercise)  3    Symptoms  tired, used rolling walker    Comments  walk test results       Social History   Tobacco Use  Smoking Status Never Smoker  Smokeless Tobacco Never Used  Tobacco Comment   no passive smokers in home    Goals Met:  Exercise tolerated well Personal goals reviewed No report of cardiac concerns or symptoms Strength training completed today  Goals Unmet:  Not Applicable  Comments:  6 Minute Walk    Row Name 06/09/18 1538         6 Minute Walk   Phase  Initial     Distance  770 feet     Walk Time  6 minutes     #  of Rest Breaks  0     MPH  1.46     METS  1.5     RPE  13     Perceived Dyspnea   3     VO2 Peak  5.24     Symptoms  Yes (comment)     Comments  tired, using rolling walker for support     Resting HR  58 bpm     Resting BP  146/84     Resting Oxygen Saturation   95 %     Exercise Oxygen Saturation  during 6 min walk  83 %     Max Ex. HR  81 bpm     Max Ex. BP  146/86     2 Minute Post BP  142/80       Interval HR   1 Minute HR  73     2 Minute HR  79     3 Minute HR  79     4 Minute HR  81     5 Minute HR  80     6 Minute HR  80     2 Minute Post HR  63     Interval Heart Rate?  Yes       Interval Oxygen   Interval Oxygen?  Yes     Baseline Oxygen Saturation %  95 %     1 Minute Oxygen Saturation %  89 %     1 Minute Liters of Oxygen  3 L pulsed     2 Minute Oxygen Saturation %  86 %     2 Minute Liters of Oxygen  3 L     3 Minute Oxygen Saturation %  85 %     3 Minute Liters of Oxygen  3 L     4 Minute Oxygen Saturation %  84 %     4 Minute Liters of Oxygen  3 L     5 Minute Oxygen Saturation %  84 %     5 Minute Liters of Oxygen  3 L     6 Minute Oxygen Saturation %  83 %     6 Minute Liters of Oxygen  3 L     2 Minute Post Oxygen Saturation %  94 %     2 Minute Post Liters of Oxygen  3 L      Time of Service 1415-1530   Dr. Emily Filbert is Medical Director for [place] and [facility].

## 2018-06-13 ENCOUNTER — Encounter: Payer: Medicare HMO | Admitting: *Deleted

## 2018-06-13 DIAGNOSIS — I272 Pulmonary hypertension, unspecified: Secondary | ICD-10-CM

## 2018-06-13 NOTE — Progress Notes (Signed)
Daily Session Note  Patient Details  Name: Julie Mcmillan MRN: [id number] Date of Birth: 07/02/1941 Referring Provider:     Pulmonary Rehab from 06/09/2018 in [facility] Cardiac and Pulmonary Rehab  Referring Provider  Rudean Hitt MD      Encounter Date: 06/13/2018  Check In: Session Check In - 06/13/18 1132      Check-In   Supervising physician immediately available to respond to emergencies  [facility] immediately available ER MD    Physician(s)  Drs. Paduchowski and Psychologist, forensic & Pulmonary Rehab    Staff Present  Renita Papa, RN BSN;Teal Bontrager Luan Pulling, MA, RCEP, CCRP, Exercise Physiologist;Joseph Tessie Fass RCP,RRT,BSRT    Medication changes reported      No    Fall or balance concerns reported     No    Warm-up and Cool-down  Performed as group-led instruction    Resistance Training Performed  Yes    VAD Patient?  No    PAD/SET Patient?  No      Pain Assessment   Currently in Pain?  No/denies          Social History   Tobacco Use  Smoking Status Never Smoker  Smokeless Tobacco Never Used  Tobacco Comment   no passive smokers in home    Goals Met:  Proper associated with RPD/PD & O2 Sat Exercise tolerated well Personal goals reviewed Queuing for purse lip breathing No report of cardiac concerns or symptoms Strength training completed today  Goals Unmet:  Not Applicable  Comments: First full day of exercise!  Patient was oriented to gym and equipment including functions, settings, policies, and procedures.  Patient's individual exercise prescription and treatment plan were reviewed.  All starting workloads were established based on the results of the 6 minute walk test done at initial orientation visit.  The plan for exercise progression was also introduced and progression will be customized based on patient's performance and goals.    Dr. Emily Filbert is Medical Director for [place] and [facility].

## 2018-06-16 DIAGNOSIS — I272 Pulmonary hypertension, unspecified: Secondary | ICD-10-CM

## 2018-06-16 LAB — GLUCOSE, CAPILLARY: GLUCOSE-CAPILLARY: 142 mg/dL — AB (ref 70–99)

## 2018-06-16 NOTE — Progress Notes (Signed)
Daily Session Note  Patient Details  Name: Julie Mcmillan MRN: [id number] Date of Birth: 08/24/1941 Referring Provider:     Pulmonary Rehab from 06/09/2018 in [facility] Cardiac and Pulmonary Rehab  Referring Provider  Parikh, Kishan MD      Encounter Date: 06/16/2018  Check In:      Social History   Tobacco Use  Smoking Status Never Smoker  Smokeless Tobacco Never Used  Tobacco Comment   no passive smokers in home    Goals Met:  Proper associated with RPD/PD & O2 Sat Independence with exercise equipment Exercise tolerated well Strength training completed today  Goals Unmet:  Not Applicable  Comments: Pt able to follow exercise prescription today without complaint.  Will continue to monitor for progression.    Dr. Mark Miller is Medical Director for [facility] and [facility]. 

## 2018-06-23 DIAGNOSIS — I272 Pulmonary hypertension, unspecified: Secondary | ICD-10-CM

## 2018-06-23 NOTE — Progress Notes (Signed)
Pulmonary Individual Treatment Plan  Patient Details  Name: Julie Mcmillan MRN: [id number] Date of Birth: 15-May-1941 Referring Provider:     Pulmonary Rehab from 06/09/2018 in [facility] Cardiac and Pulmonary Rehab  Referring Provider  Rudean Hitt MD      Initial Encounter Date:    Pulmonary Rehab from 06/09/2018 in [facility] Cardiac and Pulmonary Rehab  Date  06/09/18      Visit Diagnosis: Pulmonary hypertension ([place])  Patient's Home Medications on Admission:  Current Outpatient Medications:  .  acetaminophen (TYLENOL) 325 MG tablet, Take 2 tablets (650 mg total) by mouth every 6 (six) hours as needed for mild pain (or Fever >/= 101)., Disp: , Rfl:  .  apixaban (ELIQUIS) 5 MG TABS tablet, Take 5 mg by mouth 2 (two) times daily., Disp: , Rfl:  .  Calcium Carbonate-Vitamin D (CALCIUM 600+D) 600-200 MG-UNIT TABS, Take 1 tablet by mouth daily., Disp: , Rfl:  .  cephALEXin (KEFLEX) 250 MG capsule, Two tablets three times a day for five more days then one tablet daily afterwards, Disp: 55 capsule, Rfl: 0 .  cholestyramine (QUESTRAN) 4 GM/DOSE powder, Take 4 g by mouth every morning. , Disp: , Rfl:  .  feeding supplement, ENSURE ENLIVE, (ENSURE ENLIVE) LIQD, Take 237 mLs 2 (two) times daily between meals by mouth. (Patient not taking: Reported on 02/09/2018), Disp: 237 mL, Rfl: 12 .  FLUoxetine (PROZAC) 20 MG capsule, Take 20 mg by mouth daily., Disp: , Rfl:  .  furosemide (LASIX) 20 MG tablet, Take 1 tablet daily by mouth., Disp: , Rfl:  .  HYDROcodone-acetaminophen (NORCO/VICODIN) 5-325 MG tablet, Take 1 tablet by mouth every 6 (six) hours as needed for moderate pain., Disp: 10 tablet, Rfl: 0 .  levothyroxine (SYNTHROID, LEVOTHROID) 50 MCG tablet, Take 50 mcg by mouth daily before breakfast., Disp: , Rfl:  .  losartan (COZAAR) 100 MG tablet, Take 100 mg by mouth daily. , Disp: , Rfl:  .  pantoprazole (PROTONIX) 40 MG tablet, Take 1 tablet (40 mg total) by mouth daily., Disp: 90 tablet, Rfl: 1 .   predniSONE (DELTASONE) 10 MG tablet, Take 10 mg by mouth daily., Disp: , Rfl:  .  ranitidine (ZANTAC) 300 MG tablet, Take 300 mg by mouth at bedtime. , Disp: , Rfl:  .  sotalol (BETAPACE) 80 MG tablet, Take 1 tablet (80 mg total) by mouth every 12 (twelve) hours., Disp: 60 tablet, Rfl: 0 .  tadalafil, PAH, (ADCIRCA) 20 MG tablet, Take 2 tablets daily by mouth., Disp: , Rfl:   Past Medical History: Past Medical History:  Diagnosis Date  . A-fib ([place])   . Cancer ([place])    skin  . Chronic diarrhea   . Diverticulitis   . GERD (gastroesophageal reflux disease)    usually takes protonix  . Hip fracture ([place]) 11/2015   hairline fracture on right.  no surgery, just physical therapy  . Hypertension   . IBS (irritable bowel syndrome)   . IBS (irritable bowel syndrome)   . Interstitial lung disease ([place])    does not remember when  . Pulmonary fibrosis ([place])   . Pulmonary hypertension ([place])   . Seasonal affective disorder ([place])   . Shortness of breath dyspnea    with exertion  . Type 2 diabetes mellitus ([place]) 11/04/2014    Tobacco Use: Social History   Tobacco Use  Smoking Status Never Smoker  Smokeless Tobacco Never Used  Tobacco Comment   no passive smokers in home  Labs: Recent Review Flowsheet Data    Labs for ITP Cardiac and Pulmonary Rehab Latest Ref Rng & Units 10/27/2016   Hemoglobin A1c 4.8 - 5.6 % 5.3       Pulmonary Assessment Scores: Pulmonary Assessment Scores    Row Name 06/09/18 1430         ADL UCSD   ADL Phase  Entry     SOB Score total  66     Rest  0     Walk  1     Stairs  4     Bath  4     Dress  3     Shop  3       CAT Score   CAT Score  24       mMRC Score   mMRC Score  2        Pulmonary Function Assessment: Pulmonary Function Assessment - 06/09/18 1447      Breath   Bilateral Breath Sounds  Clear;Decreased    Shortness of Breath  Yes;Fear of Shortness of Breath;Limiting activity       Exercise Target Goals: Exercise Program  Goal: Individual exercise prescription set using results from initial 6 min walk test and THRR while considering  patient's activity barriers and safety.   Exercise Prescription Goal: Initial exercise prescription builds to 30-45 minutes a day of aerobic activity, 2-3 days per week.  Home exercise guidelines will be given to patient during program as part of exercise prescription that the participant will acknowledge.  Activity Barriers & Risk Stratification: Activity Barriers & Cardiac Risk Stratification - 06/09/18 1541      Activity Barriers & Cardiac Risk Stratification   Activity Barriers  Joint Problems;Other (comment);History of Falls;Balance Concerns;Shortness of Breath;Deconditioning;Muscular Weakness;Assistive Device    Comments  R shoulder dislocated from fall Nov 2018, has to carry oxygen on left side, uses walker for longer distances       6 Minute Walk: 6 Minute Walk    Row Name 06/09/18 1538         6 Minute Walk   Phase  Initial     Distance  770 feet     Walk Time  6 minutes     # of Rest Breaks  0     MPH  1.46     METS  1.5     RPE  13     Perceived Dyspnea   3     VO2 Peak  5.24     Symptoms  Yes (comment)     Comments  tired, using rolling walker for support     Resting HR  58 bpm     Resting BP  146/84     Resting Oxygen Saturation   95 %     Exercise Oxygen Saturation  during 6 min walk  83 %     Max Ex. HR  81 bpm     Max Ex. BP  146/86     2 Minute Post BP  142/80       Interval HR   1 Minute HR  73     2 Minute HR  79     3 Minute HR  79     4 Minute HR  81     5 Minute HR  80     6 Minute HR  80     2 Minute Post HR  63     Interval Heart Rate?  Yes  Interval Oxygen   Interval Oxygen?  Yes     Baseline Oxygen Saturation %  95 %     1 Minute Oxygen Saturation %  89 %     1 Minute Liters of Oxygen  3 L pulsed     2 Minute Oxygen Saturation %  86 %     2 Minute Liters of Oxygen  3 L     3 Minute Oxygen Saturation %  85 %     3  Minute Liters of Oxygen  3 L     4 Minute Oxygen Saturation %  84 %     4 Minute Liters of Oxygen  3 L     5 Minute Oxygen Saturation %  84 %     5 Minute Liters of Oxygen  3 L     6 Minute Oxygen Saturation %  83 %     6 Minute Liters of Oxygen  3 L     2 Minute Post Oxygen Saturation %  94 %     2 Minute Post Liters of Oxygen  3 L       Oxygen Initial Assessment: Oxygen Initial Assessment - 06/09/18 1445      Home Oxygen   Home Oxygen Device  Home Concentrator;Portable Concentrator;E-Tanks    Sleep Oxygen Prescription  Continuous    Liters per minute  2    Home Exercise Oxygen Prescription  Continuous    Liters per minute  3    Home at Rest Exercise Oxygen Prescription  Continuous    Liters per minute  2    Compliance with Home Oxygen Use  Yes      Initial 6 min Walk   Oxygen Used  Pulsed;Portable Concentrator    Liters per minute  3      Program Oxygen Prescription   Program Oxygen Prescription  Continuous    Liters per minute  3      Intervention   Short Term Goals  To learn and exhibit compliance with exercise, home and travel O2 prescription;To learn and understand importance of monitoring SPO2 with pulse oximeter and demonstrate accurate use of the pulse oximeter.;To learn and understand importance of maintaining oxygen saturations>88%;To learn and demonstrate proper pursed lip breathing techniques or other breathing techniques.;To learn and demonstrate proper use of respiratory medications    Long  Term Goals  Exhibits compliance with exercise, home and travel O2 prescription;Verbalizes importance of monitoring SPO2 with pulse oximeter and return demonstration;Maintenance of O2 saturations>88%;Exhibits proper breathing techniques, such as pursed lip breathing or other method taught during program session;Compliance with respiratory medication;Demonstrates proper use of MDI's       Oxygen Re-Evaluation: Oxygen Re-Evaluation    Row Name 06/13/18 1134              Goals/Expected Outcomes   Short Term Goals  To learn and understand importance of monitoring SPO2 with pulse oximeter and demonstrate accurate use of the pulse oximeter.;To learn and understand importance of maintaining oxygen saturations>88%;To learn and demonstrate proper pursed lip breathing techniques or other breathing techniques.       Long  Term Goals  Verbalizes importance of monitoring SPO2 with pulse oximeter and return demonstration;Maintenance of O2 saturations>88%;Exhibits proper breathing techniques, such as pursed lip breathing or other method taught during program session       Comments  Reviewed PLB technique with pt.  Talked about how it work and it's important to maintaining his exercise saturations.  Goals/Expected Outcomes  Short: Become more profiecient at using PLB.   Long: Become independent at using PLB.          Oxygen Discharge (Final Oxygen Re-Evaluation): Oxygen Re-Evaluation - 06/13/18 1134      Goals/Expected Outcomes   Short Term Goals  To learn and understand importance of monitoring SPO2 with pulse oximeter and demonstrate accurate use of the pulse oximeter.;To learn and understand importance of maintaining oxygen saturations>88%;To learn and demonstrate proper pursed lip breathing techniques or other breathing techniques.    Long  Term Goals  Verbalizes importance of monitoring SPO2 with pulse oximeter and return demonstration;Maintenance of O2 saturations>88%;Exhibits proper breathing techniques, such as pursed lip breathing or other method taught during program session    Comments  Reviewed PLB technique with pt.  Talked about how it work and it's important to maintaining his exercise saturations.      Goals/Expected Outcomes  Short: Become more profiecient at using PLB.   Long: Become independent at using PLB.       Initial Exercise Prescription: Initial Exercise Prescription - 06/09/18 1500      Date of Initial Exercise RX and Referring Provider    Date  06/09/18    Referring Provider  Rudean Hitt MD      Oxygen   Oxygen  Continuous      Treadmill   MPH  1.3    Grade  0    Minutes  15    METs  2      NuStep   Level  2    SPM  80    Minutes  15    METs  2      Recumbant Elliptical   Level  1    RPM  50    Minutes  15    METs  2      Prescription Details   Frequency (times per week)  3    Duration  Progress to 45 minutes of aerobic exercise without signs/symptoms of physical distress      Intensity   THRR 40-80% of Max Heartrate  92-125    Ratings of Perceived Exertion  11-13    Perceived Dyspnea  0-4      Progression   Progression  Continue to progress workloads to maintain intensity without signs/symptoms of physical distress.      Resistance Training   Training Prescription  Yes    Weight  3 lbs    Reps  10-15       Perform Capillary Blood Glucose checks as needed.  Exercise Prescription Changes: Exercise Prescription Changes    Row Name 06/09/18 1500             Response to Exercise   Blood Pressure (Admit)  146/84       Blood Pressure (Exercise)  146/86       Blood Pressure (Exit)  142/80       Heart Rate (Admit)  58 bpm       Heart Rate (Exercise)  81 bpm       Heart Rate (Exit)  66 bpm       Oxygen Saturation (Admit)  95 %       Oxygen Saturation (Exercise)  83 %       Oxygen Saturation (Exit)  94 %       Rating of Perceived Exertion (Exercise)  13       Perceived Dyspnea (Exercise)  3       Symptoms  tired, used rolling walker       Comments  walk test results          Exercise Comments: Exercise Comments    Row Name 06/13/18 1134           Exercise Comments  First full day of exercise!  Patient was oriented to gym and equipment including functions, settings, policies, and procedures.  Patient's individual exercise prescription and treatment plan were reviewed.  All starting workloads were established based on the results of the 6 minute walk test done at initial  orientation visit.  The plan for exercise progression was also introduced and progression will be customized based on patient's performance and goals.          Exercise Goals and Review: Exercise Goals    Row Name 06/09/18 1546             Exercise Goals   Increase Physical Activity  Yes       Intervention  Provide advice, education, support and counseling about physical activity/exercise needs.;Develop an individualized exercise prescription for aerobic and resistive training based on initial evaluation findings, risk stratification, comorbidities and participant's personal goals.       Expected Outcomes  Short Term: Attend rehab on a regular basis to increase amount of physical activity.;Long Term: Add in home exercise to make exercise part of routine and to increase amount of physical activity.;Long Term: Exercising regularly at least 3-5 days a week.       Increase Strength and Stamina  Yes       Intervention  Provide advice, education, support and counseling about physical activity/exercise needs.;Develop an individualized exercise prescription for aerobic and resistive training based on initial evaluation findings, risk stratification, comorbidities and participant's personal goals.       Expected Outcomes  Short Term: Increase workloads from initial exercise prescription for resistance, speed, and METs.;Short Term: Perform resistance training exercises routinely during rehab and add in resistance training at home;Long Term: Improve cardiorespiratory fitness, muscular endurance and strength as measured by increased METs and functional capacity (6MWT)       Able to understand and use rate of perceived exertion (RPE) scale  Yes       Intervention  Provide education and explanation on how to use RPE scale       Expected Outcomes  Short Term: Able to use RPE daily in rehab to express subjective intensity level;Long Term:  Able to use RPE to guide intensity level when exercising independently        Able to understand and use Dyspnea scale  Yes       Intervention  Provide education and explanation on how to use Dyspnea scale       Expected Outcomes  Short Term: Able to use Dyspnea scale daily in rehab to express subjective sense of shortness of breath during exertion;Long Term: Able to use Dyspnea scale to guide intensity level when exercising independently       Knowledge and understanding of Target Heart Rate Range (THRR)  Yes       Intervention  Provide education and explanation of THRR including how the numbers were predicted and where they are located for reference       Expected Outcomes  Short Term: Able to state/look up THRR;Short Term: Able to use daily as guideline for intensity in rehab;Long Term: Able to use THRR to govern intensity when exercising independently       Able to check pulse independently  Yes  Intervention  Provide education and demonstration on how to check pulse in carotid and radial arteries.;Review the importance of being able to check your own pulse for safety during independent exercise       Expected Outcomes  Short Term: Able to explain why pulse checking is important during independent exercise;Long Term: Able to check pulse independently and accurately       Understanding of Exercise Prescription  Yes       Intervention  Provide education, explanation, and written materials on patient's individual exercise prescription       Expected Outcomes  Short Term: Able to explain program exercise prescription;Long Term: Able to explain home exercise prescription to exercise independently          Exercise Goals Re-Evaluation : Exercise Goals Re-Evaluation    Row Name 06/13/18 1134             Exercise Goal Re-Evaluation   Exercise Goals Review  Knowledge and understanding of Target Heart Rate Range (THRR);Increase Strength and Stamina;Increase Physical Activity;Able to understand and use rate of perceived exertion (RPE) scale;Able to understand and  use Dyspnea scale       Comments  Reviewed RPE scale, THR and program prescription with pt today.  Pt voiced understanding and was given a copy of goals to take home.        Expected Outcomes  Short: Use RPE daily to regulate intensity. Long: Follow program prescription in THR.          Discharge Exercise Prescription (Final Exercise Prescription Changes): Exercise Prescription Changes - 06/09/18 1500      Response to Exercise   Blood Pressure (Admit)  146/84    Blood Pressure (Exercise)  146/86    Blood Pressure (Exit)  142/80    Heart Rate (Admit)  58 bpm    Heart Rate (Exercise)  81 bpm    Heart Rate (Exit)  66 bpm    Oxygen Saturation (Admit)  95 %    Oxygen Saturation (Exercise)  83 %    Oxygen Saturation (Exit)  94 %    Rating of Perceived Exertion (Exercise)  13    Perceived Dyspnea (Exercise)  3    Symptoms  tired, used rolling walker    Comments  walk test results       Nutrition:  Target Goals: Understanding of nutrition guidelines, daily intake of sodium <1549m, cholesterol <2071m calories 30% from fat and 7% or less from saturated fats, daily to have 5 or more servings of fruits and vegetables.  Biometrics: Pre Biometrics - 06/09/18 1546      Pre Biometrics   Height  5' 4.5" (1.638 m)    Weight  151 lb 11.2 oz (68.8 kg)    Waist Circumference  34.5 inches    Hip Circumference  41 inches    Waist to Hip Ratio  0.84 %    BMI (Calculated)  25.65    Single Leg Stand  0 seconds        Nutrition Therapy Plan and Nutrition Goals: Nutrition Therapy & Goals - 06/09/18 1453      Personal Nutrition Goals   Nutrition Goal  Patient would like to maintain her weight.    Personal Goal #2  She wants to learn how to eat healthier.      Intervention Plan   Intervention  Prescribe, educate and counsel regarding individualized specific dietary modifications aiming towards targeted core components such as weight, hypertension, lipid management, diabetes, heart failure and  other comorbidities.;Nutrition handout(s) given to patient.    Expected Outcomes  Short Term Goal: Understand basic principles of dietary content, such as calories, fat, sodium, cholesterol and nutrients.;Long Term Goal: Adherence to prescribed nutrition plan.       Nutrition Assessments: Nutrition Assessments - 06/09/18 1433      MEDFICTS Scores   Pre Score  54       Nutrition Goals Re-Evaluation:   Nutrition Goals Discharge (Final Nutrition Goals Re-Evaluation):   Psychosocial: Target Goals: Acknowledge presence or absence of significant depression and/or stress, maximize coping skills, provide positive support system. Participant is able to verbalize types and ability to use techniques and skills needed for reducing stress and depression.   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 She is worried about the "END" and has been sad. She is taking diltiazem to help her with her heart .      Family Dynamics   Good Support System?  Yes    Comments  She can look to her two sons for support. She talks to her sons about every other day.       Barriers   Psychosocial barriers to participate in program  The patient should benefit from training in stress management and relaxation.      Screening Interventions   Interventions  Encouraged to exercise;To provide support and resources with identified psychosocial needs;Program counselor consult;Provide feedback about the scores to participant    Expected Outcomes  Short Term goal: Utilizing psychosocial counselor, staff and physician to assist with identification of specific Stressors or current issues interfering with healing process. Setting desired  goal for each stressor or current issue identified.;Long Term Goal: Stressors or current issues are controlled or eliminated.;Short Term goal: Identification and review with participant of any Quality of Life or Depression concerns found by scoring the questionnaire.;Long Term goal: The participant improves quality of Life and PHQ9 Scores as seen by post scores and/or verbalization of changes       Quality of Life Scores:  Scores of 19 and below usually indicate a poorer quality of life in these areas.  A difference of  2-3 points is a clinically meaningful difference.  A difference of 2-3 points in the total score of the Quality of Life Index has been associated with significant improvement in overall quality of life, self-image, physical symptoms, and general health in studies assessing change in quality of life.  PHQ-9: Recent Review Flowsheet Data    Depression screen [facility] 2/9 06/09/2018   Decreased Interest 1   Down, Depressed, Hopeless 0   PHQ - 2 Score 1   Altered sleeping 2   Tired, decreased energy 2   Change in appetite 2   Feeling bad or failure about yourself  0   Trouble concentrating 0   Moving slowly or fidgety/restless 0   Suicidal thoughts 0   PHQ-9 Score 7   Difficult doing work/chores Somewhat difficult     Interpretation of Total Score  Total Score Depression Severity:  1-4 = Minimal depression, 5-9 = Mild depression, 10-14 = Moderate depression, 15-19 = Moderately severe depression, 20-27 = Severe depression   Psychosocial Evaluation and Intervention:   Psychosocial  Re-Evaluation:   Psychosocial Discharge (Final Psychosocial Re-Evaluation):   Education: Education Goals: Education classes will be provided on a weekly basis, covering required topics. Participant will state understanding/return demonstration of topics presented.  Learning Barriers/Preferences: Learning Barriers/Preferences - 06/09/18 1455      Learning Barriers/Preferences   Learning  Barriers  None    Learning Preferences  None       Education Topics:  Initial Evaluation Education: - Verbal, written and demonstration of respiratory meds, oximetry and breathing techniques. Instruction on use of nebulizers and MDIs and importance of monitoring MDI activations.   Pulmonary Rehab from 06/13/2018 in [facility] Cardiac and Pulmonary Rehab  Date  06/09/18  Educator  [facility]  Instruction Review Code  1- Verbalizes Understanding      General Nutrition Guidelines/Fats and Fiber: -Group instruction provided by verbal, written material, models and posters to present the general guidelines for heart healthy nutrition. Gives an explanation and review of dietary fats and fiber.   Controlling Sodium/Reading Food Labels: -Group verbal and written material supporting the discussion of sodium use in heart healthy nutrition. Review and explanation with models, verbal and written materials for utilization of the food label.   Exercise Physiology & General Exercise Guidelines: - Group verbal and written instruction with models to review the exercise physiology of the cardiovascular system and associated critical values. Provides general exercise guidelines with specific guidelines to those with heart or lung disease.    Aerobic Exercise & Resistance Training: - Gives group verbal and written instruction on the various components of exercise. Focuses on aerobic and resistive training programs and the benefits of this training and how to safely progress through these programs.   Flexibility, Balance, Mind/Body Relaxation: Provides group verbal/written instruction on the benefits of flexibility and balance training, including mind/body exercise modes such as yoga, pilates and tai chi.  Demonstration and skill practice provided.   Stress and Anxiety: - Provides group verbal and written instruction about the health risks of elevated stress and causes of high stress.  Discuss the correlation  between heart/lung disease and anxiety and treatment options. Review healthy ways to manage with stress and anxiety.   Depression: - Provides group verbal and written instruction on the correlation between heart/lung disease and depressed mood, treatment options, and the stigmas associated with seeking treatment.   Exercise & Equipment Safety: - Individual verbal instruction and demonstration of equipment use and safety with use of the equipment.   Pulmonary Rehab from 06/13/2018 in [facility] Cardiac and Pulmonary Rehab  Date  06/09/18  Educator  [facility]  Instruction Review Code  1- Verbalizes Understanding      Infection Prevention: - Provides verbal and written material to individual with discussion of infection control including proper hand washing and proper equipment cleaning during exercise session.   Pulmonary Rehab from 06/13/2018 in [facility] Cardiac and Pulmonary Rehab  Date  06/09/18  Educator  [facility]  Instruction Review Code  1- Verbalizes Understanding      Falls Prevention: - Provides verbal and written material to individual with discussion of falls prevention and safety.   Pulmonary Rehab from 06/13/2018 in [facility] Cardiac and Pulmonary Rehab  Date  06/09/18  Educator  [facility]  Instruction Review Code  1- Verbalizes Understanding      Diabetes: - Individual verbal and written instruction to review signs/symptoms of diabetes, desired ranges of glucose level fasting, after meals and with exercise. Advice that pre and post exercise glucose checks will be done for 3 sessions at entry of  program.   Chronic Lung Diseases: - Group verbal and written instruction to review updates, respiratory medications, advancements in procedures and treatments. Discuss use of supplemental oxygen including available portable oxygen systems, continuous and intermittent flow rates, concentrators, personal use and safety guidelines. Review proper use of inhaler and spacers. Provide informative websites for  self-education.    Pulmonary Rehab from 06/13/2018 in [facility] Cardiac and Pulmonary Rehab  Date  06/13/18  Educator  [facility]  Instruction Review Code  1- Verbalizes Understanding      Energy Conservation: - Provide group verbal and written instruction for methods to conserve energy, plan and organize activities. Instruct on pacing techniques, use of adaptive equipment and posture/positioning to relieve shortness of breath.   Triggers and Exacerbations: - Group verbal and written instruction to review types of environmental triggers and ways to prevent exacerbations. Discuss weather changes, air quality and the benefits of nasal washing. Review warning signs and symptoms to help prevent infections. Discuss techniques for effective airway clearance, coughing, and vibrations.   AED/CPR: - Group verbal and written instruction with the use of models to demonstrate the basic use of the AED with the basic ABC's of resuscitation.   Anatomy and Physiology of the Lungs: - Group verbal and written instruction with the use of models to provide basic lung anatomy and physiology related to function, structure and complications of lung disease.   Anatomy & Physiology of the Heart: - Group verbal and written instruction and models provide basic cardiac anatomy and physiology, with the coronary electrical and arterial systems. Review of Valvular disease and Heart Failure   Cardiac Medications: - Group verbal and written instruction to review commonly prescribed medications for heart disease. Reviews the medication, class of the drug, and side effects.   Know Your Numbers and Risk Factors: -Group verbal and written instruction about important numbers in your health.  Discussion of what are risk factors and how they play a role in the disease process.  Review of Cholesterol, Blood Pressure, Diabetes, and BMI and the role they play in your overall health.   Sleep Hygiene: -Provides group verbal and written  instruction about how sleep can affect your health.  Define sleep hygiene, discuss sleep cycles and impact of sleep habits. Review good sleep hygiene tips.    Other: -Provides group and verbal instruction on various topics (see comments)    Knowledge Questionnaire Score: Knowledge Questionnaire Score - 06/09/18 1432      Knowledge Questionnaire Score   Pre Score  16/18   Reviewed with patient       Core Components/Risk Factors/Patient Goals at Admission: Personal Goals and Risk Factors at Admission - 06/09/18 1456      Core Components/Risk Factors/Patient Goals on Admission    Weight Management  Yes;Weight Loss    Intervention  Weight Management: Develop a combined nutrition and exercise program designed to reach desired caloric intake, while maintaining appropriate intake of nutrient and fiber, sodium and fats, and appropriate energy expenditure required for the weight goal.;Weight Management: Provide education and appropriate resources to help participant work on and attain dietary goals.;Weight Management/Obesity: Establish reasonable short term and long term weight goals.    Admit Weight  151 lb 11.2 oz (68.8 kg)    Goal Weight: Short Term  146 lb (66.2 kg)    Goal Weight: Long Term  141 lb (64 kg)    Expected Outcomes  Short Term: Continue to assess and modify interventions until short term weight is achieved;Long Term: Adherence to  nutrition and physical activity/exercise program aimed toward attainment of established weight goal;Understanding recommendations for meals to include 15-35% energy as protein, 25-35% energy from fat, 35-60% energy from carbohydrates, less than 280m of dietary cholesterol, 20-35 gm of total fiber daily;Understanding of distribution of calorie intake throughout the day with the consumption of 4-5 meals/snacks;Weight Loss: Understanding of general recommendations for a balanced deficit meal plan, which promotes 1-2 lb weight loss per week and includes a  negative energy balance of [phone] kcal/d    Improve shortness of breath with ADL's  Yes    Intervention  Provide education, individualized exercise plan and daily activity instruction to help decrease symptoms of SOB with activities of daily living.    Expected Outcomes  Short Term: Improve cardiorespiratory fitness to achieve a reduction of symptoms when performing ADLs;Long Term: Be able to perform more ADLs without symptoms or delay the onset of symptoms    Diabetes  Yes   states she does not but has been diagnosed before   Intervention  Provide education about signs/symptoms and action to take for hypo/hyperglycemia.;Provide education about proper nutrition, including hydration, and aerobic/resistive exercise prescription along with prescribed medications to achieve blood glucose in normal ranges: Fasting glucose 65-99 mg/dL    Expected Outcomes  Long Term: Attainment of HbA1C < 7%.;Short Term: Participant verbalizes understanding of the signs/symptoms and immediate care of hyper/hypoglycemia, proper foot care and importance of medication, aerobic/resistive exercise and nutrition plan for blood glucose control.    Hypertension  Yes    Intervention  Monitor prescription use compliance.;Provide education on lifestyle modifcations including regular physical activity/exercise, weight management, moderate sodium restriction and increased consumption of fresh fruit, vegetables, and low fat dairy, alcohol moderation, and smoking cessation.    Expected Outcomes  Short Term: Continued assessment and intervention until BP is < 140/91mHG in hypertensive participants. < 130/8078mG in hypertensive participants with diabetes, heart failure or chronic kidney disease.;Long Term: Maintenance of blood pressure at goal levels.       Core Components/Risk Factors/Patient Goals Review:    Core Components/Risk Factors/Patient Goals at Discharge (Final Review):    ITP Comments: ITP Comments    Row Name  06/09/18 1413 06/23/18 0843         ITP Comments  Medical Evaluation completed. Chart sent for review and changes to Dr. [place] [place] [facility] encounter 05/22/18  30 day review completed. ITP sent to Dr. [place]ily [facility]ctor [facility] [place] [facility] unless changes are made by physician         Comments: 30 day review

## 2018-06-25 ENCOUNTER — Encounter: Payer: Medicare HMO | Admitting: *Deleted

## 2018-06-25 DIAGNOSIS — I272 Pulmonary hypertension, unspecified: Secondary | ICD-10-CM

## 2018-06-25 LAB — GLUCOSE, CAPILLARY
GLUCOSE-CAPILLARY: 138 mg/dL — AB (ref 70–99)
GLUCOSE-CAPILLARY: 104 mg/dL — AB (ref 70–99)

## 2018-06-25 NOTE — Progress Notes (Signed)
Daily Session Note  Patient Details  Name: Julie Mcmillan MRN: [id number] Date of Birth: 06-Nov-1940 Referring Provider:     Pulmonary Rehab from 06/09/2018 in [facility] Cardiac and Pulmonary Rehab  Referring Provider  Rudean Hitt MD      Encounter Date: 06/25/2018  Check In: Session Check In - 06/25/18 1128      Check-In   Supervising physician immediately available to respond to emergencies  [facility] immediately available ER MD    Physician(s)  Dr. Jimmye Norman and Darl Householder    Location  [facility]-Cardiac & Pulmonary Rehab    Staff Present  Renita Papa, RN BSN;Jessica Luan Pulling, MA, RCEP, CCRP, Exercise Physiologist;Joseph Tessie Fass RCP,RRT,BSRT    Medication changes reported      No    Fall or balance concerns reported     No    Warm-up and Cool-down  Performed as group-led instruction    Resistance Training Performed  Yes    VAD Patient?  No    PAD/SET Patient?  No      Pain Assessment   Currently in Pain?  No/denies          Social History   Tobacco Use  Smoking Status Never Smoker  Smokeless Tobacco Never Used  Tobacco Comment   no passive smokers in home    Goals Met:  Proper associated with RPD/PD & O2 Sat Independence with exercise equipment Using PLB without cueing & demonstrates good technique Exercise tolerated well No report of cardiac concerns or symptoms Strength training completed today  Goals Unmet:  Not Applicable  Comments: Pt able to follow exercise prescription today without complaint.  Will continue to monitor for progression. Reviewed home exercise with pt today.  Pt plans to walk at home for exercise.  Reviewed THR, pulse, RPE, sign and symptoms, NTG use, and when to call 911 or MD.  Also discussed weather considerations and indoor options.  Pt voiced understanding.    Dr. Emily Filbert is Medical Director for [facility] and [facility].

## 2018-07-02 ENCOUNTER — Encounter: Payer: Medicare HMO | Attending: Internal Medicine

## 2018-07-02 DIAGNOSIS — I272 Pulmonary hypertension, unspecified: Secondary | ICD-10-CM | POA: Insufficient documentation

## 2018-07-02 DIAGNOSIS — J449 Chronic obstructive pulmonary disease, unspecified: Secondary | ICD-10-CM | POA: Insufficient documentation

## 2018-07-07 ENCOUNTER — Telehealth: Payer: Self-pay | Admitting: *Deleted

## 2018-07-07 ENCOUNTER — Encounter: Payer: Self-pay | Admitting: *Deleted

## 2018-07-07 DIAGNOSIS — I272 Pulmonary hypertension, unspecified: Secondary | ICD-10-CM

## 2018-07-07 NOTE — Telephone Encounter (Signed)
Called to check on pt. She has been out since 9/25.  Left message

## 2018-07-15 ENCOUNTER — Telehealth: Payer: Self-pay

## 2018-07-15 NOTE — Telephone Encounter (Signed)
Called patient since she has not been in class since 06/25/18. Left message for patient to call back.

## 2018-07-21 DIAGNOSIS — I272 Pulmonary hypertension, unspecified: Secondary | ICD-10-CM

## 2018-07-21 NOTE — Progress Notes (Signed)
Pulmonary Individual Treatment Plan  Patient Details  Name: Julie Mcmillan MRN: [id number] Date of Birth: 15-May-1941 Referring Provider:     Pulmonary Rehab from 06/09/2018 in [facility] Cardiac and Pulmonary Rehab  Referring Provider  Rudean Hitt MD      Initial Encounter Date:    Pulmonary Rehab from 06/09/2018 in [facility] Cardiac and Pulmonary Rehab  Date  06/09/18      Visit Diagnosis: Pulmonary hypertension ([place])  Patient's Home Medications on Admission:  Current Outpatient Medications:  .  acetaminophen (TYLENOL) 325 MG tablet, Take 2 tablets (650 mg total) by mouth every 6 (six) hours as needed for mild pain (or Fever >/= 101)., Disp: , Rfl:  .  apixaban (ELIQUIS) 5 MG TABS tablet, Take 5 mg by mouth 2 (two) times daily., Disp: , Rfl:  .  Calcium Carbonate-Vitamin D (CALCIUM 600+D) 600-200 MG-UNIT TABS, Take 1 tablet by mouth daily., Disp: , Rfl:  .  cephALEXin (KEFLEX) 250 MG capsule, Two tablets three times a day for five more days then one tablet daily afterwards, Disp: 55 capsule, Rfl: 0 .  cholestyramine (QUESTRAN) 4 GM/DOSE powder, Take 4 g by mouth every morning. , Disp: , Rfl:  .  feeding supplement, ENSURE ENLIVE, (ENSURE ENLIVE) LIQD, Take 237 mLs 2 (two) times daily between meals by mouth. (Patient not taking: Reported on 02/09/2018), Disp: 237 mL, Rfl: 12 .  FLUoxetine (PROZAC) 20 MG capsule, Take 20 mg by mouth daily., Disp: , Rfl:  .  furosemide (LASIX) 20 MG tablet, Take 1 tablet daily by mouth., Disp: , Rfl:  .  HYDROcodone-acetaminophen (NORCO/VICODIN) 5-325 MG tablet, Take 1 tablet by mouth every 6 (six) hours as needed for moderate pain., Disp: 10 tablet, Rfl: 0 .  levothyroxine (SYNTHROID, LEVOTHROID) 50 MCG tablet, Take 50 mcg by mouth daily before breakfast., Disp: , Rfl:  .  losartan (COZAAR) 100 MG tablet, Take 100 mg by mouth daily. , Disp: , Rfl:  .  pantoprazole (PROTONIX) 40 MG tablet, Take 1 tablet (40 mg total) by mouth daily., Disp: 90 tablet, Rfl: 1 .   predniSONE (DELTASONE) 10 MG tablet, Take 10 mg by mouth daily., Disp: , Rfl:  .  ranitidine (ZANTAC) 300 MG tablet, Take 300 mg by mouth at bedtime. , Disp: , Rfl:  .  sotalol (BETAPACE) 80 MG tablet, Take 1 tablet (80 mg total) by mouth every 12 (twelve) hours., Disp: 60 tablet, Rfl: 0 .  tadalafil, PAH, (ADCIRCA) 20 MG tablet, Take 2 tablets daily by mouth., Disp: , Rfl:   Past Medical History: Past Medical History:  Diagnosis Date  . A-fib ([place])   . Cancer ([place])    skin  . Chronic diarrhea   . Diverticulitis   . GERD (gastroesophageal reflux disease)    usually takes protonix  . Hip fracture ([place]) 11/2015   hairline fracture on right.  no surgery, just physical therapy  . Hypertension   . IBS (irritable bowel syndrome)   . IBS (irritable bowel syndrome)   . Interstitial lung disease ([place])    does not remember when  . Pulmonary fibrosis ([place])   . Pulmonary hypertension ([place])   . Seasonal affective disorder ([place])   . Shortness of breath dyspnea    with exertion  . Type 2 diabetes mellitus ([place]) 11/04/2014    Tobacco Use: Social History   Tobacco Use  Smoking Status Never Smoker  Smokeless Tobacco Never Used  Tobacco Comment   no passive smokers in home  Labs: Recent Review Flowsheet Data    Labs for ITP Cardiac and Pulmonary Rehab Latest Ref Rng & Units 10/27/2016   Hemoglobin A1c 4.8 - 5.6 % 5.3       Pulmonary Assessment Scores: Pulmonary Assessment Scores    Row Name 06/09/18 1430         ADL UCSD   ADL Phase  Entry     SOB Score total  66     Rest  0     Walk  1     Stairs  4     Bath  4     Dress  3     Shop  3       CAT Score   CAT Score  24       mMRC Score   mMRC Score  2        Pulmonary Function Assessment: Pulmonary Function Assessment - 06/09/18 1447      Breath   Bilateral Breath Sounds  Clear;Decreased    Shortness of Breath  Yes;Fear of Shortness of Breath;Limiting activity       Exercise Target Goals: Exercise Program  Goal: Individual exercise prescription set using results from initial 6 min walk test and THRR while considering  patient's activity barriers and safety.   Exercise Prescription Goal: Initial exercise prescription builds to 30-45 minutes a day of aerobic activity, 2-3 days per week.  Home exercise guidelines will be given to patient during program as part of exercise prescription that the participant will acknowledge.  Activity Barriers & Risk Stratification: Activity Barriers & Cardiac Risk Stratification - 06/09/18 1541      Activity Barriers & Cardiac Risk Stratification   Activity Barriers  Joint Problems;Other (comment);History of Falls;Balance Concerns;Shortness of Breath;Deconditioning;Muscular Weakness;Assistive Device    Comments  R shoulder dislocated from fall Nov 2018, has to carry oxygen on left side, uses walker for longer distances       6 Minute Walk: 6 Minute Walk    Row Name 06/09/18 1538         6 Minute Walk   Phase  Initial     Distance  770 feet     Walk Time  6 minutes     # of Rest Breaks  0     MPH  1.46     METS  1.5     RPE  13     Perceived Dyspnea   3     VO2 Peak  5.24     Symptoms  Yes (comment)     Comments  tired, using rolling walker for support     Resting HR  58 bpm     Resting BP  146/84     Resting Oxygen Saturation   95 %     Exercise Oxygen Saturation  during 6 min walk  83 %     Max Ex. HR  81 bpm     Max Ex. BP  146/86     2 Minute Post BP  142/80       Interval HR   1 Minute HR  73     2 Minute HR  79     3 Minute HR  79     4 Minute HR  81     5 Minute HR  80     6 Minute HR  80     2 Minute Post HR  63     Interval Heart Rate?  Yes  Interval Oxygen   Interval Oxygen?  Yes     Baseline Oxygen Saturation %  95 %     1 Minute Oxygen Saturation %  89 %     1 Minute Liters of Oxygen  3 L pulsed     2 Minute Oxygen Saturation %  86 %     2 Minute Liters of Oxygen  3 L     3 Minute Oxygen Saturation %  85 %     3  Minute Liters of Oxygen  3 L     4 Minute Oxygen Saturation %  84 %     4 Minute Liters of Oxygen  3 L     5 Minute Oxygen Saturation %  84 %     5 Minute Liters of Oxygen  3 L     6 Minute Oxygen Saturation %  83 %     6 Minute Liters of Oxygen  3 L     2 Minute Post Oxygen Saturation %  94 %     2 Minute Post Liters of Oxygen  3 L       Oxygen Initial Assessment: Oxygen Initial Assessment - 06/09/18 1445      Home Oxygen   Home Oxygen Device  Home Concentrator;Portable Concentrator;E-Tanks    Sleep Oxygen Prescription  Continuous    Liters per minute  2    Home Exercise Oxygen Prescription  Continuous    Liters per minute  3    Home at Rest Exercise Oxygen Prescription  Continuous    Liters per minute  2    Compliance with Home Oxygen Use  Yes      Initial 6 min Walk   Oxygen Used  Pulsed;Portable Concentrator    Liters per minute  3      Program Oxygen Prescription   Program Oxygen Prescription  Continuous    Liters per minute  3      Intervention   Short Term Goals  To learn and exhibit compliance with exercise, home and travel O2 prescription;To learn and understand importance of monitoring SPO2 with pulse oximeter and demonstrate accurate use of the pulse oximeter.;To learn and understand importance of maintaining oxygen saturations>88%;To learn and demonstrate proper pursed lip breathing techniques or other breathing techniques.;To learn and demonstrate proper use of respiratory medications    Long  Term Goals  Exhibits compliance with exercise, home and travel O2 prescription;Verbalizes importance of monitoring SPO2 with pulse oximeter and return demonstration;Maintenance of O2 saturations>88%;Exhibits proper breathing techniques, such as pursed lip breathing or other method taught during program session;Compliance with respiratory medication;Demonstrates proper use of MDI's       Oxygen Re-Evaluation: Oxygen Re-Evaluation    Row Name 06/13/18 1134              Goals/Expected Outcomes   Short Term Goals  To learn and understand importance of monitoring SPO2 with pulse oximeter and demonstrate accurate use of the pulse oximeter.;To learn and understand importance of maintaining oxygen saturations>88%;To learn and demonstrate proper pursed lip breathing techniques or other breathing techniques.       Long  Term Goals  Verbalizes importance of monitoring SPO2 with pulse oximeter and return demonstration;Maintenance of O2 saturations>88%;Exhibits proper breathing techniques, such as pursed lip breathing or other method taught during program session       Comments  Reviewed PLB technique with pt.  Talked about how it work and it's important to maintaining his exercise saturations.  Goals/Expected Outcomes  Short: Become more profiecient at using PLB.   Long: Become independent at using PLB.          Oxygen Discharge (Final Oxygen Re-Evaluation): Oxygen Re-Evaluation - 06/13/18 1134      Goals/Expected Outcomes   Short Term Goals  To learn and understand importance of monitoring SPO2 with pulse oximeter and demonstrate accurate use of the pulse oximeter.;To learn and understand importance of maintaining oxygen saturations>88%;To learn and demonstrate proper pursed lip breathing techniques or other breathing techniques.    Long  Term Goals  Verbalizes importance of monitoring SPO2 with pulse oximeter and return demonstration;Maintenance of O2 saturations>88%;Exhibits proper breathing techniques, such as pursed lip breathing or other method taught during program session    Comments  Reviewed PLB technique with pt.  Talked about how it work and it's important to maintaining his exercise saturations.      Goals/Expected Outcomes  Short: Become more profiecient at using PLB.   Long: Become independent at using PLB.       Initial Exercise Prescription: Initial Exercise Prescription - 06/09/18 1500      Date of Initial Exercise RX and Referring Provider    Date  06/09/18    Referring Provider  Rudean Hitt MD      Oxygen   Oxygen  Continuous      Treadmill   MPH  1.3    Grade  0    Minutes  15    METs  2      NuStep   Level  2    SPM  80    Minutes  15    METs  2      Recumbant Elliptical   Level  1    RPM  50    Minutes  15    METs  2      Prescription Details   Frequency (times per week)  3    Duration  Progress to 45 minutes of aerobic exercise without signs/symptoms of physical distress      Intensity   THRR 40-80% of Max Heartrate  92-125    Ratings of Perceived Exertion  11-13    Perceived Dyspnea  0-4      Progression   Progression  Continue to progress workloads to maintain intensity without signs/symptoms of physical distress.      Resistance Training   Training Prescription  Yes    Weight  3 lbs    Reps  10-15       Perform Capillary Blood Glucose checks as needed.  Exercise Prescription Changes: Exercise Prescription Changes    Row Name 06/09/18 1500 06/25/18 1200           Response to Exercise   Blood Pressure (Admit)  146/84  126/64      Blood Pressure (Exercise)  146/86  132/70      Blood Pressure (Exit)  142/80  126/64      Heart Rate (Admit)  58 bpm  56 bpm      Heart Rate (Exercise)  81 bpm  83 bpm      Heart Rate (Exit)  66 bpm  55 bpm      Oxygen Saturation (Admit)  95 %  94 %      Oxygen Saturation (Exercise)  83 %  92 %      Oxygen Saturation (Exit)  94 %  98 %      Rating of Perceived Exertion (Exercise)  13  11  Perceived Dyspnea (Exercise)  3  2      Symptoms  tired, used rolling walker  SOB, fatigue      Comments  walk test results  -      Duration  -  Progress to 45 minutes of aerobic exercise without signs/symptoms of physical distress      Intensity  -  THRR unchanged        Progression   Progression  -  Continue to progress workloads to maintain intensity without signs/symptoms of physical distress.      Average METs  -  1.7        Resistance Training    Training Prescription  -  Yes      Weight  -  3 lbs      Reps  -  10-15        Interval Training   Interval Training  -  No        Oxygen   Oxygen  -  Continuous      Liters  -  3        Treadmill   MPH  -  1.3      Grade  -  0      Minutes  -  15      METs  -  2        NuStep   Level  -  2      Minutes  -  15      METs  -  1.7        Recumbant Elliptical   Level  -  1      Minutes  -  15      METs  -  1.4        Home Exercise Plan   Plans to continue exercise at  -  Home (comment) walking      Frequency  -  Add 1 additional day to program exercise sessions.      Initial Home Exercises Provided  -  06/25/18         Exercise Comments: Exercise Comments    Row Name 06/13/18 1134           Exercise Comments  First full day of exercise!  Patient was oriented to gym and equipment including functions, settings, policies, and procedures.  Patient's individual exercise prescription and treatment plan were reviewed.  All starting workloads were established based on the results of the 6 minute walk test done at initial orientation visit.  The plan for exercise progression was also introduced and progression will be customized based on patient's performance and goals.          Exercise Goals and Review: Exercise Goals    Row Name 06/09/18 1546             Exercise Goals   Increase Physical Activity  Yes       Intervention  Provide advice, education, support and counseling about physical activity/exercise needs.;Develop an individualized exercise prescription for aerobic and resistive training based on initial evaluation findings, risk stratification, comorbidities and participant's personal goals.       Expected Outcomes  Short Term: Attend rehab on a regular basis to increase amount of physical activity.;Long Term: Add in home exercise to make exercise part of routine and to increase amount of physical activity.;Long Term: Exercising regularly at least 3-5 days a week.        Increase Strength and Stamina  Yes  Intervention  Provide advice, education, support and counseling about physical activity/exercise needs.;Develop an individualized exercise prescription for aerobic and resistive training based on initial evaluation findings, risk stratification, comorbidities and participant's personal goals.       Expected Outcomes  Short Term: Increase workloads from initial exercise prescription for resistance, speed, and METs.;Short Term: Perform resistance training exercises routinely during rehab and add in resistance training at home;Long Term: Improve cardiorespiratory fitness, muscular endurance and strength as measured by increased METs and functional capacity (6MWT)       Able to understand and use rate of perceived exertion (RPE) scale  Yes       Intervention  Provide education and explanation on how to use RPE scale       Expected Outcomes  Short Term: Able to use RPE daily in rehab to express subjective intensity level;Long Term:  Able to use RPE to guide intensity level when exercising independently       Able to understand and use Dyspnea scale  Yes       Intervention  Provide education and explanation on how to use Dyspnea scale       Expected Outcomes  Short Term: Able to use Dyspnea scale daily in rehab to express subjective sense of shortness of breath during exertion;Long Term: Able to use Dyspnea scale to guide intensity level when exercising independently       Knowledge and understanding of Target Heart Rate Range (THRR)  Yes       Intervention  Provide education and explanation of THRR including how the numbers were predicted and where they are located for reference       Expected Outcomes  Short Term: Able to state/look up THRR;Short Term: Able to use daily as guideline for intensity in rehab;Long Term: Able to use THRR to govern intensity when exercising independently       Able to check pulse independently  Yes       Intervention  Provide  education and demonstration on how to check pulse in carotid and radial arteries.;Review the importance of being able to check your own pulse for safety during independent exercise       Expected Outcomes  Short Term: Able to explain why pulse checking is important during independent exercise;Long Term: Able to check pulse independently and accurately       Understanding of Exercise Prescription  Yes       Intervention  Provide education, explanation, and written materials on patient's individual exercise prescription       Expected Outcomes  Short Term: Able to explain program exercise prescription;Long Term: Able to explain home exercise prescription to exercise independently          Exercise Goals Re-Evaluation : Exercise Goals Re-Evaluation    Row Name 06/13/18 1134 06/25/18 1232 07/07/18 1640         Exercise Goal Re-Evaluation   Exercise Goals Review  Knowledge and understanding of Target Heart Rate Range (THRR);Increase Strength and Stamina;Increase Physical Activity;Able to understand and use rate of perceived exertion (RPE) scale;Able to understand and use Dyspnea scale  Increase Physical Activity;Increase Strength and Stamina;Able to understand and use Dyspnea scale;Able to check pulse independently;Understanding of Exercise Prescription;Knowledge and understanding of Target Heart Rate Range (THRR);Able to understand and use rate of perceived exertion (RPE) scale  -     Comments  Reviewed RPE scale, THR and program prescription with pt today.  Pt voiced understanding and was given a copy of goals to take home.  Reviewed home exercise with pt today.  Pt plans to walk at home for exercise.  Reviewed THR, pulse, RPE, sign and symptoms, NTG use, and when to call 911 or MD.  Also discussed weather considerations and indoor options.  Pt voiced understanding.  out since last review     Expected Outcomes  Short: Use RPE daily to regulate intensity. Long: Follow program prescription in THR.   Short: patient will add one day of exercise outside of LW. Long: patient will become independent with exercise   -        Discharge Exercise Prescription (Final Exercise Prescription Changes): Exercise Prescription Changes - 06/25/18 1200      Response to Exercise   Blood Pressure (Admit)  126/64    Blood Pressure (Exercise)  132/70    Blood Pressure (Exit)  126/64    Heart Rate (Admit)  56 bpm    Heart Rate (Exercise)  83 bpm    Heart Rate (Exit)  55 bpm    Oxygen Saturation (Admit)  94 %    Oxygen Saturation (Exercise)  92 %    Oxygen Saturation (Exit)  98 %    Rating of Perceived Exertion (Exercise)  11    Perceived Dyspnea (Exercise)  2    Symptoms  SOB, fatigue    Duration  Progress to 45 minutes of aerobic exercise without signs/symptoms of physical distress    Intensity  THRR unchanged      Progression   Progression  Continue to progress workloads to maintain intensity without signs/symptoms of physical distress.    Average METs  1.7      Resistance Training   Training Prescription  Yes    Weight  3 lbs    Reps  10-15      Interval Training   Interval Training  No      Oxygen   Oxygen  Continuous    Liters  3      Treadmill   MPH  1.3    Grade  0    Minutes  15    METs  2      NuStep   Level  2    Minutes  15    METs  1.7      Recumbant Elliptical   Level  1    Minutes  15    METs  1.4      Home Exercise Plan   Plans to continue exercise at  Home (comment)   walking   Frequency  Add 1 additional day to program exercise sessions.    Initial Home Exercises Provided  06/25/18       Nutrition:  Target Goals: Understanding of nutrition guidelines, daily intake of sodium <15101m, cholesterol <2037m calories 30% from fat and 7% or less from saturated fats, daily to have 5 or more servings of fruits and vegetables.  Biometrics: Pre Biometrics - 06/09/18 1546      Pre Biometrics   Height  5' 4.5" (1.638 m)    Weight  151 lb 11.2 oz (68.8 kg)     Waist Circumference  34.5 inches    Hip Circumference  41 inches    Waist to Hip Ratio  0.84 %    BMI (Calculated)  25.65    Single Leg Stand  0 seconds        Nutrition Therapy Plan and Nutrition Goals: Nutrition Therapy & Goals - 06/25/18 1532      Nutrition Therapy   Diet  DASH  Protein (specify units)  9oz    Fiber  25 grams    Whole Grain Foods  3 servings   does not always choose whole grains   Saturated Fats  12 max. grams    Fruits and Vegetables  5 servings/day   8 ideal; eats more vegetables than fruits   Sodium  1500 grams      Personal Nutrition Goals   Nutrition Goal  Work to reduce the amount of added sugars in your diet, for example: decrease the number of days per week you eat ice cream and/or swap at least 1/2 the sugar in your sweet tea for stevia zero calorie sweetener. This will help reduce empty calories as well, which increases your chances of maintaining your CBW as desired    Personal Goal #2  To reduce your daily sodium intake, try buying frozen vegetables rather than canned and also look for low salt snack foods like your Triscuits & cheese    Comments  She enjoys cooking but energy levels vary d/t health status, so she has learned to cook enough food for 2-3 days at a time. H/o 12# wt loss within the past year d/t being placed in a rehab program which she has since regained and would like to maintain. 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 She prepares well-balanced meals at home, which are typically smaller and more frequent. Likes oatmeal or english muffin breakfast sandwich with egg and [place] sausage. She is trying to work on reducing salt and sugar in her diet in order to help maintain her weight and improve overall health      Intervention Plan   Intervention  Prescribe, educate and counsel regarding individualized specific dietary modifications aiming towards targeted core components such as weight,  hypertension, lipid management, diabetes, heart failure and other comorbidities.;Nutrition handout(s) given to patient.   Lower sodium swaps sheet; Low sodium diet guidelines handout   Expected Outcomes  Short Term Goal: A plan has been developed with personal nutrition goals set during dietitian appointment.;Long Term Goal: Adherence to prescribed nutrition plan.       Nutrition Assessments: Nutrition Assessments - 06/09/18 1433      MEDFICTS Scores   Pre Score  54       Nutrition Goals Re-Evaluation: Nutrition Goals Re-Evaluation    [place] Name 06/25/18 1548             Goals   Nutrition Goal  Work to reduce the amount of added sugars in your diet, for example: decrease the number of days per week you eat ice cream and/or swap at least 1/2 the sugar in your sweet tea for stevia zero calorie sweetener. This will help reduce empty calories as well, which increases your chances of maintaining your CBW as desired       Comment  She had gotten into the habit of eating ice cream daily while trying to re-gain body weight and now finds it difficult to stop. Her appetite has greatly improved       Expected Outcome  She will eat foods with added sugar such as ice cream on occasion, but not necessarily daily. She will maintain CBW +/- 5# as desired         Personal Goal #2 Re-Evaluation   Personal Goal #2  To reduce your daily sodium intake, trying buying frozen vegetables rather than canned but also look for low salt snack foods like your Triscuits and cheese  Nutrition Goals Discharge (Final Nutrition Goals Re-Evaluation): Nutrition Goals Re-Evaluation - 06/25/18 1548      Goals   Nutrition Goal  Work to reduce the amount of added sugars in your diet, for example: decrease the number of days per week you eat ice cream and/or swap at least 1/2 the sugar in your sweet tea for stevia zero calorie sweetener. This will help reduce empty calories as well, which increases your chances of  maintaining your CBW as desired    Comment  She had gotten into the habit of eating ice cream daily while trying to re-gain body weight and now finds it difficult to stop. Her appetite has greatly improved    Expected Outcome  She will eat foods with added sugar such as ice cream on occasion, but not necessarily daily. She will maintain CBW +/- 5# as desired      Personal Goal #2 Re-Evaluation   Personal Goal #2  To reduce your daily sodium intake, trying buying frozen vegetables rather than canned but also look for low salt snack foods like your Triscuits and cheese       Psychosocial: Target Goals: Acknowledge presence or absence of significant depression and/or stress, maximize coping skills, provide positive support system. Participant is able to verbalize types and ability to use techniques and skills needed for reducing stress and depression.   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 She is worried about the "END" and has been sad. She is taking diltiazem to help her with her heart .      Family Dynamics   Good Support System?  Yes    Comments  She can look to her two sons for support. She talks to her sons about every other day.       Barriers   Psychosocial barriers to participate in program  The patient should benefit from training in stress management and relaxation.      Screening Interventions   Interventions  Encouraged to exercise;To provide support and resources with identified psychosocial needs;Program counselor consult;Provide feedback about the scores to participant    Expected Outcomes  Short Term goal: Utilizing psychosocial counselor, staff and physician to assist with  identification of specific Stressors or current issues interfering with healing process. Setting desired goal for each stressor or current issue identified.;Long Term Goal: Stressors or current issues are controlled or eliminated.;Short Term goal: Identification and review with participant of any Quality of Life or Depression concerns found by scoring the questionnaire.;Long Term goal: The participant improves quality of Life and PHQ9 Scores as seen by post scores and/or verbalization of changes       Quality of Life Scores:  Scores of 19 and below usually indicate a poorer quality of life in these areas.  A difference of  2-3 points is a clinically meaningful difference.  A difference of 2-3 points in the total score of the Quality of Life Index has been associated with significant improvement in overall quality of life, self-image, physical symptoms, and general health in studies assessing change in quality of life.  PHQ-9: Recent Review Flowsheet Data    Depression screen [facility] 2/9 06/09/2018   Decreased Interest 1  Down, Depressed, Hopeless 0   PHQ - 2 Score 1   Altered sleeping 2   Tired, decreased energy 2   Change in appetite 2   Feeling bad or failure about yourself  0   Trouble concentrating 0   Moving slowly or fidgety/restless 0   Suicidal thoughts 0   PHQ-9 Score 7   Difficult doing work/chores Somewhat difficult     Interpretation of Total Score  Total Score Depression Severity:  1-4 = Minimal depression, 5-9 = Mild depression, 10-14 = Moderate depression, 15-19 = Moderately severe depression, 20-27 = Severe depression   Psychosocial Evaluation and Intervention:   Psychosocial Re-Evaluation:   Psychosocial Discharge (Final Psychosocial Re-Evaluation):   Education: Education Goals: Education classes will be provided on a weekly basis, covering required topics. Participant will state understanding/return demonstration of topics presented.  Learning  Barriers/Preferences: Learning Barriers/Preferences - 06/09/18 1455      Learning Barriers/Preferences   Learning Barriers  None    Learning Preferences  None       Education Topics:  Initial Evaluation Education: - Verbal, written and demonstration of respiratory meds, oximetry and breathing techniques. Instruction on use of nebulizers and MDIs and importance of monitoring MDI activations.   Pulmonary Rehab from 06/25/2018 in [facility] Cardiac and Pulmonary Rehab  Date  06/09/18  Educator  [facility]  Instruction Review Code  1- Verbalizes Understanding      General Nutrition Guidelines/Fats and Fiber: -Group instruction provided by verbal, written material, models and posters to present the general guidelines for heart healthy nutrition. Gives an explanation and review of dietary fats and fiber.   Controlling Sodium/Reading Food Labels: -Group verbal and written material supporting the discussion of sodium use in heart healthy nutrition. Review and explanation with models, verbal and written materials for utilization of the food label.   Exercise Physiology & General Exercise Guidelines: - Group verbal and written instruction with models to review the exercise physiology of the cardiovascular system and associated critical values. Provides general exercise guidelines with specific guidelines to those with heart or lung disease.    Pulmonary Rehab from 06/25/2018 in [facility] Cardiac and Pulmonary Rehab  Date  06/25/18  Educator  [facility]  Instruction Review Code  1- Verbalizes Understanding      Aerobic Exercise & Resistance Training: - Gives group verbal and written instruction on the various components of exercise. Focuses on aerobic and resistive training programs and the benefits of this training and how to safely progress through these programs.   Flexibility, Balance, Mind/Body Relaxation: Provides group verbal/written instruction on the benefits of flexibility and balance training, including  mind/body exercise modes such as yoga, pilates and tai chi.  Demonstration and skill practice provided.   Stress and Anxiety: - Provides group verbal and written instruction about the health risks of elevated stress and causes of high stress.  Discuss the correlation between heart/lung disease and anxiety and treatment options. Review healthy ways to manage with stress and anxiety.   Depression: - Provides group verbal and written instruction on the correlation between heart/lung disease and depressed mood, treatment options, and the stigmas associated with seeking treatment.   Exercise & Equipment Safety: - Individual verbal instruction and demonstration of equipment use and safety with use of the equipment.   Pulmonary Rehab from 06/25/2018 in [facility] Cardiac and Pulmonary Rehab  Date  06/09/18  Educator  [facility]  Instruction Review Code  1- Verbalizes Understanding      Infection Prevention: - Provides verbal and written  material to individual with discussion of infection control including proper hand washing and proper equipment cleaning during exercise session.   Pulmonary Rehab from 06/25/2018 in [facility] Cardiac and Pulmonary Rehab  Date  06/09/18  Educator  [facility]  Instruction Review Code  1- Verbalizes Understanding      Falls Prevention: - Provides verbal and written material to individual with discussion of falls prevention and safety.   Pulmonary Rehab from 06/25/2018 in [facility] Cardiac and Pulmonary Rehab  Date  06/09/18  Educator  [facility]  Instruction Review Code  1- Verbalizes Understanding      Diabetes: - Individual verbal and written instruction to review signs/symptoms of diabetes, desired ranges of glucose level fasting, after meals and with exercise. Advice that pre and post exercise glucose checks will be done for 3 sessions at entry of program.   Chronic Lung Diseases: - Group verbal and written instruction to review updates, respiratory medications, advancements in procedures  and treatments. Discuss use of supplemental oxygen including available portable oxygen systems, continuous and intermittent flow rates, concentrators, personal use and safety guidelines. Review proper use of inhaler and spacers. Provide informative websites for self-education.    Pulmonary Rehab from 06/25/2018 in [facility] Cardiac and Pulmonary Rehab  Date  06/13/18  Educator  [facility]  Instruction Review Code  1- Verbalizes Understanding      Energy Conservation: - Provide group verbal and written instruction for methods to conserve energy, plan and organize activities. Instruct on pacing techniques, use of adaptive equipment and posture/positioning to relieve shortness of breath.   Triggers and Exacerbations: - Group verbal and written instruction to review types of environmental triggers and ways to prevent exacerbations. Discuss weather changes, air quality and the benefits of nasal washing. Review warning signs and symptoms to help prevent infections. Discuss techniques for effective airway clearance, coughing, and vibrations.   AED/CPR: - Group verbal and written instruction with the use of models to demonstrate the basic use of the AED with the basic ABC's of resuscitation.   Anatomy and Physiology of the Lungs: - Group verbal and written instruction with the use of models to provide basic lung anatomy and physiology related to function, structure and complications of lung disease.   Anatomy & Physiology of the Heart: - Group verbal and written instruction and models provide basic cardiac anatomy and physiology, with the coronary electrical and arterial systems. Review of Valvular disease and Heart Failure   Cardiac Medications: - Group verbal and written instruction to review commonly prescribed medications for heart disease. Reviews the medication, class of the drug, and side effects.   Know Your Numbers and Risk Factors: -Group verbal and written instruction about important numbers in  your health.  Discussion of what are risk factors and how they play a role in the disease process.  Review of Cholesterol, Blood Pressure, Diabetes, and BMI and the role they play in your overall health.   Sleep Hygiene: -Provides group verbal and written instruction about how sleep can affect your health.  Define sleep hygiene, discuss sleep cycles and impact of sleep habits. Review good sleep hygiene tips.    Other: -Provides group and verbal instruction on various topics (see comments)    Knowledge Questionnaire Score: Knowledge Questionnaire Score - 06/09/18 1432      Knowledge Questionnaire Score   Pre Score  16/18   Reviewed with patient       Core Components/Risk Factors/Patient Goals at Admission: Personal Goals and Risk Factors at Admission - 06/09/18 1456  Core Components/Risk Factors/Patient Goals on Admission    Weight Management  Yes;Weight Loss    Intervention  Weight Management: Develop a combined nutrition and exercise program designed to reach desired caloric intake, while maintaining appropriate intake of nutrient and fiber, sodium and fats, and appropriate energy expenditure required for the weight goal.;Weight Management: Provide education and appropriate resources to help participant work on and attain dietary goals.;Weight Management/Obesity: Establish reasonable short term and long term weight goals.    Admit Weight  151 lb 11.2 oz (68.8 kg)    Goal Weight: Short Term  146 lb (66.2 kg)    Goal Weight: Long Term  141 lb (64 kg)    Expected Outcomes  Short Term: Continue to assess and modify interventions until short term weight is achieved;Long Term: Adherence to nutrition and physical activity/exercise program aimed toward attainment of established weight goal;Understanding recommendations for meals to include 15-35% energy as protein, 25-35% energy from fat, 35-60% energy from carbohydrates, less than 278m of dietary cholesterol, 20-35 gm of total fiber  daily;Understanding of distribution of calorie intake throughout the day with the consumption of 4-5 meals/snacks;Weight Loss: Understanding of general recommendations for a balanced deficit meal plan, which promotes 1-2 lb weight loss per week and includes a negative energy balance of [phone] kcal/d    Improve shortness of breath with ADL's  Yes    Intervention  Provide education, individualized exercise plan and daily activity instruction to help decrease symptoms of SOB with activities of daily living.    Expected Outcomes  Short Term: Improve cardiorespiratory fitness to achieve a reduction of symptoms when performing ADLs;Long Term: Be able to perform more ADLs without symptoms or delay the onset of symptoms    Diabetes  Yes   states she does not but has been diagnosed before   Intervention  Provide education about signs/symptoms and action to take for hypo/hyperglycemia.;Provide education about proper nutrition, including hydration, and aerobic/resistive exercise prescription along with prescribed medications to achieve blood glucose in normal ranges: Fasting glucose 65-99 mg/dL    Expected Outcomes  Long Term: Attainment of HbA1C < 7%.;Short Term: Participant verbalizes understanding of the signs/symptoms and immediate care of hyper/hypoglycemia, proper foot care and importance of medication, aerobic/resistive exercise and nutrition plan for blood glucose control.    Hypertension  Yes    Intervention  Monitor prescription use compliance.;Provide education on lifestyle modifcations including regular physical activity/exercise, weight management, moderate sodium restriction and increased consumption of fresh fruit, vegetables, and low fat dairy, alcohol moderation, and smoking cessation.    Expected Outcomes  Short Term: Continued assessment and intervention until BP is < 140/96mHG in hypertensive participants. < 130/8054mG in hypertensive participants with diabetes, heart failure or chronic  kidney disease.;Long Term: Maintenance of blood pressure at goal levels.       Core Components/Risk Factors/Patient Goals Review:    Core Components/Risk Factors/Patient Goals at Discharge (Final Review):    ITP Comments: ITP Comments    Row Name 06/09/18 1413 06/23/18 0843 07/07/18 1639 07/21/18 0859     ITP Comments  Medical Evaluation completed. Chart sent for review and changes to Dr. MarEmily Filbertrector of [facility] [facility]agnosis can be found in [facility] encounter 05/22/18  30 day review completed. ITP sent to Dr. [place]Emily Filbertrector [place] unless changes are made by physician  Called to check on pt. She has been out since 9/25.  Left message  30 day review completed. ITP sent to Dr. MarEmily Filbert  Director of [place]. Continue with ITP unless changes are made by physician.       Comments: 30 day review

## 2018-07-22 ENCOUNTER — Encounter: Payer: Self-pay | Admitting: *Deleted

## 2018-07-22 ENCOUNTER — Telehealth: Payer: Self-pay | Admitting: *Deleted

## 2018-07-22 DIAGNOSIS — I272 Pulmonary hypertension, unspecified: Secondary | ICD-10-CM

## 2018-07-22 NOTE — Telephone Encounter (Signed)
Called to check up on pt.  Out since 9/25.  Left message.

## 2018-07-23 ENCOUNTER — Telehealth: Payer: Self-pay

## 2018-07-23 DIAGNOSIS — I272 Pulmonary hypertension, unspecified: Secondary | ICD-10-CM

## 2018-07-23 NOTE — Progress Notes (Signed)
Discharge Progress Report  Patient Details  Name: Julie Mcmillan MRN: [id number] Date of Birth: [birth year] Referring Provider:     Pulmonary Rehab from 06/09/2018 in [facility]  Referring Provider  Rudean Hitt MD       Number of Visits: 5/36  Reason for Discharge:  Early Exit:  Lack of attendance  Smoking History:  Social History   Tobacco Use  Smoking Status Never Smoker  Smokeless Tobacco Never Used  Tobacco Comment   no passive smokers in home    Diagnosis:  Pulmonary hypertension ([place])  ADL [facility]: Pulmonary Assessment Scores    Row Name 06/09/18 1430         ADL [facility]   ADL Phase  Entry     SOB Score total  66     Rest  0     Walk  1     Stairs  4     Bath  4     Dress  3     Shop  3       CAT Score   CAT Score  24       mMRC Score   mMRC Score  2        Initial Exercise Prescription: Initial Exercise Prescription - 06/09/18 1500      Date of Initial Exercise RX and Referring Provider   Date  06/09/18    Referring Provider  Rudean Hitt MD      Oxygen   Oxygen  Continuous      Treadmill   MPH  1.3    Grade  0    Minutes  15    METs  2      NuStep   Level  2    SPM  80    Minutes  15    METs  2      Recumbant Elliptical   Level  1    RPM  50    Minutes  15    METs  2      Prescription Details   Frequency (times per week)  3    Duration  Progress to 45 minutes of aerobic exercise without signs/symptoms of physical distress      Intensity   THRR 40-80% of Max Heartrate  92-125    Ratings of Perceived Exertion  11-13    Perceived Dyspnea  0-4      Progression   Progression  Continue to progress workloads to maintain intensity without signs/symptoms of physical distress.      Resistance Training   Training Prescription  Yes    Weight  3 lbs    Reps  10-15       Discharge Exercise Prescription (Final Exercise Prescription Changes): Exercise Prescription Changes - 06/25/18 1200      Response to  Exercise   Blood Pressure (Admit)  126/64    Blood Pressure (Exercise)  132/70    Blood Pressure (Exit)  126/64    Heart Rate (Admit)  56 bpm    Heart Rate (Exercise)  83 bpm    Heart Rate (Exit)  55 bpm    Oxygen Saturation (Admit)  94 %    Oxygen Saturation (Exercise)  92 %    Oxygen Saturation (Exit)  98 %    Rating of Perceived Exertion (Exercise)  11    Perceived Dyspnea (Exercise)  2    Symptoms  SOB, fatigue    Duration  Progress to 45 minutes of  aerobic exercise without signs/symptoms of physical distress    Intensity  THRR unchanged      Progression   Progression  Continue to progress workloads to maintain intensity without signs/symptoms of physical distress.    Average METs  1.7      Resistance Training   Training Prescription  Yes    Weight  3 lbs    Reps  10-15      Interval Training   Interval Training  No      Oxygen   Oxygen  Continuous    Liters  3      Treadmill   MPH  1.3    Grade  0    Minutes  15    METs  2      NuStep   Level  2    Minutes  15    METs  1.7      Recumbant Elliptical   Level  1    Minutes  15    METs  1.4      Home Exercise Plan   Plans to continue exercise at  Home (comment)   walking   Frequency  Add 1 additional day to program exercise sessions.    Initial Home Exercises Provided  06/25/18       Functional Capacity: 6 Minute Walk    Row Name 06/09/18 1538         6 Minute Walk   Phase  Initial     Distance  770 feet     Walk Time  6 minutes     # of Rest Breaks  0     MPH  1.46     METS  1.5     RPE  13     Perceived Dyspnea   3     VO2 Peak  5.24     Symptoms  Yes (comment)     Comments  tired, using rolling walker for support     Resting HR  58 bpm     Resting BP  146/84     Resting Oxygen Saturation   95 %     Exercise Oxygen Saturation  during 6 min walk  83 %     Max Ex. HR  81 bpm     Max Ex. BP  146/86     2 Minute Post BP  142/80       Interval HR   1 Minute HR  73     2 Minute HR  79      3 Minute HR  79     4 Minute HR  81     5 Minute HR  80     6 Minute HR  80     2 Minute Post HR  63     Interval Heart Rate?  Yes       Interval Oxygen   Interval Oxygen?  Yes     Baseline Oxygen Saturation %  95 %     1 Minute Oxygen Saturation %  89 %     1 Minute Liters of Oxygen  3 L pulsed     2 Minute Oxygen Saturation %  86 %     2 Minute Liters of Oxygen  3 L     3 Minute Oxygen Saturation %  85 %     3 Minute Liters of Oxygen  3 L     4 Minute Oxygen Saturation %  84 %  4 Minute Liters of Oxygen  3 L     5 Minute Oxygen Saturation %  84 %     5 Minute Liters of Oxygen  3 L     6 Minute Oxygen Saturation %  83 %     6 Minute Liters of Oxygen  3 L     2 Minute Post Oxygen Saturation %  94 %     2 Minute Post Liters of Oxygen  3 L        Psychological, QOL, Others - Outcomes: PHQ 2/9: Depression screen PHQ 2/9 06/09/2018  Decreased Interest 1  Down, Depressed, Hopeless 0  PHQ - 2 Score 1  Altered sleeping 2  Tired, decreased energy 2  Change in appetite 2  Feeling bad or failure about yourself  0  Trouble concentrating 0  Moving slowly or fidgety/restless 0  Suicidal thoughts 0  PHQ-9 Score 7  Difficult doing work/chores Somewhat difficult    Quality of Life:   Personal Goals: Goals established at orientation with interventions provided to work toward goal. Personal Goals and Risk Factors at Admission - 06/09/18 1456      Core Components/Risk Factors/Patient Goals on Admission    Weight Management  Yes;Weight Loss    Intervention  Weight Management: Develop a combined nutrition and exercise program designed to reach desired caloric intake, while maintaining appropriate intake of nutrient and fiber, sodium and fats, and appropriate energy expenditure required for the weight goal.;Weight Management: Provide education and appropriate resources to help participant work on and attain dietary goals.;Weight Management/Obesity: Establish reasonable short term  and long term weight goals.    Admit Weight  151 lb 11.2 oz (68.8 kg)    Goal Weight: Short Term  146 lb (66.2 kg)    Goal Weight: Long Term  141 lb (64 kg)    Expected Outcomes  Short Term: Continue to assess and modify interventions until short term weight is achieved;Long Term: Adherence to nutrition and physical activity/exercise program aimed toward attainment of established weight goal;Understanding recommendations for meals to include 15-35% energy as protein, 25-35% energy from fat, 35-60% energy from carbohydrates, less than 246m of dietary cholesterol, 20-35 gm of total fiber daily;Understanding of distribution of calorie intake throughout the day with the consumption of 4-5 meals/snacks;Weight Loss: Understanding of general recommendations for a balanced deficit meal plan, which promotes 1-2 lb weight loss per week and includes a negative energy balance of [phone] kcal/d    Improve shortness of breath with ADL's  Yes    Intervention  Provide education, individualized exercise plan and daily activity instruction to help decrease symptoms of SOB with activities of daily living.    Expected Outcomes  Short Term: Improve cardiorespiratory fitness to achieve a reduction of symptoms when performing ADLs;Long Term: Be able to perform more ADLs without symptoms or delay the onset of symptoms    Diabetes  Yes   states she does not but has been diagnosed before   Intervention  Provide education about signs/symptoms and action to take for hypo/hyperglycemia.;Provide education about proper nutrition, including hydration, and aerobic/resistive exercise prescription along with prescribed medications to achieve blood glucose in normal ranges: Fasting glucose 65-99 mg/dL    Expected Outcomes  Long Term: Attainment of HbA1C < 7%.;Short Term: Participant verbalizes understanding of the signs/symptoms and immediate care of hyper/hypoglycemia, proper foot care and importance of medication, aerobic/resistive  exercise and nutrition plan for blood glucose control.    Hypertension  Yes    Intervention  Monitor prescription use compliance.;Provide education on lifestyle modifcations including regular physical activity/exercise, weight management, moderate sodium restriction and increased consumption of fresh fruit, vegetables, and low fat dairy, alcohol moderation, and smoking cessation.    Expected Outcomes  Short Term: Continued assessment and intervention until BP is < 140/31m HG in hypertensive participants. < 130/869mHG in hypertensive participants with diabetes, heart failure or chronic kidney disease.;Long Term: Maintenance of blood pressure at goal levels.        Personal Goals Discharge:   Exercise Goals and Review: Exercise Goals    Row Name 06/09/18 1546             Exercise Goals   Increase Physical Activity  Yes       Intervention  Provide advice, education, support and counseling about physical activity/exercise needs.;Develop an individualized exercise prescription for aerobic and resistive training based on initial evaluation findings, risk stratification, comorbidities and participant's personal goals.       Expected Outcomes  Short Term: Attend rehab on a regular basis to increase amount of physical activity.;Long Term: Add in home exercise to make exercise part of routine and to increase amount of physical activity.;Long Term: Exercising regularly at least 3-5 days a week.       Increase Strength and Stamina  Yes       Intervention  Provide advice, education, support and counseling about physical activity/exercise needs.;Develop an individualized exercise prescription for aerobic and resistive training based on initial evaluation findings, risk stratification, comorbidities and participant's personal goals.       Expected Outcomes  Short Term: Increase workloads from initial exercise prescription for resistance, speed, and METs.;Short Term: Perform resistance training exercises  routinely during rehab and add in resistance training at home;Long Term: Improve cardiorespiratory fitness, muscular endurance and strength as measured by increased METs and functional capacity (6MWT)       Able to understand and use rate of perceived exertion (RPE) scale  Yes       Intervention  Provide education and explanation on how to use RPE scale       Expected Outcomes  Short Term: Able to use RPE daily in rehab to express subjective intensity level;Long Term:  Able to use RPE to guide intensity level when exercising independently       Able to understand and use Dyspnea scale  Yes       Intervention  Provide education and explanation on how to use Dyspnea scale       Expected Outcomes  Short Term: Able to use Dyspnea scale daily in rehab to express subjective sense of shortness of breath during exertion;Long Term: Able to use Dyspnea scale to guide intensity level when exercising independently       Knowledge and understanding of Target Heart Rate Range (THRR)  Yes       Intervention  Provide education and explanation of THRR including how the numbers were predicted and where they are located for reference       Expected Outcomes  Short Term: Able to state/look up THRR;Short Term: Able to use daily as guideline for intensity in rehab;Long Term: Able to use THRR to govern intensity when exercising independently       Able to check pulse independently  Yes       Intervention  Provide education and demonstration on how to check pulse in carotid and radial arteries.;Review the importance of being able to check your own pulse for safety during independent exercise  Expected Outcomes  Short Term: Able to explain why pulse checking is important during independent exercise;Long Term: Able to check pulse independently and accurately       Understanding of Exercise Prescription  Yes       Intervention  Provide education, explanation, and written materials on patient's individual exercise  prescription       Expected Outcomes  Short Term: Able to explain program exercise prescription;Long Term: Able to explain home exercise prescription to exercise independently          Nutrition & Weight - Outcomes: Pre Biometrics - 06/09/18 1546      Pre Biometrics   Height  5' 4.5" (1.638 m)    Weight  151 lb 11.2 oz (68.8 kg)    Waist Circumference  34.5 inches    Hip Circumference  41 inches    Waist to Hip Ratio  0.84 %    BMI (Calculated)  25.65    Single Leg Stand  0 seconds        Nutrition: Nutrition Therapy & Goals - 06/25/18 1532      Nutrition Therapy   Diet  DASH    Protein (specify units)  9oz    Fiber  25 grams    Whole Grain Foods  3 servings   does not always choose whole grains   Saturated Fats  12 max. grams    Fruits and Vegetables  5 servings/day   8 ideal; eats more vegetables than fruits   Sodium  1500 grams      Personal Nutrition Goals   Nutrition Goal  Work to reduce the amount of added sugars in your diet, for example: decrease the number of days per week you eat ice cream and/or swap at least 1/2 the sugar in your sweet tea for stevia zero calorie sweetener. This will help reduce empty calories as well, which increases your chances of maintaining your CBW as desired    Personal Goal #2  To reduce your daily sodium intake, try buying frozen vegetables rather than canned and also look for low salt snack foods like your Triscuits & cheese    Comments  She enjoys cooking but energy levels vary d/t health status, so she has learned to cook enough food for 2-3 days at a time. H/o 12# wt loss within the past year d/t being placed in a rehab program which she has since regained and would like to maintain. 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 She prepares well-balanced meals at home, which are typically smaller and more frequent. Likes oatmeal or english muffin breakfast sandwich with egg and [place] sausage. She  is trying to work on reducing salt and sugar in her diet in order to help maintain her weight and improve overall health      Intervention Plan   Intervention  Prescribe, educate and counsel regarding individualized specific dietary modifications aiming towards targeted core components such as weight, hypertension, lipid management, diabetes, heart failure and other comorbidities.;Nutrition handout(s) given to patient.   Lower sodium swaps sheet; Low sodium diet guidelines handout   Expected Outcomes  Short Term Goal: A plan has been developed with personal nutrition goals set during dietitian appointment.;Long Term Goal: Adherence to prescribed nutrition plan.       Nutrition Discharge: Nutrition Assessments - 06/09/18 1433      MEDFICTS Scores   Pre Score  54       Education Questionnaire Score: Knowledge  Questionnaire Score - 06/09/18 1432      Knowledge Questionnaire Score   Pre Score  16/18   Reviewed with patient      Goals reviewed with patient; copy given to patient.

## 2018-07-23 NOTE — Telephone Encounter (Signed)
Called to check up on patient. She has been out since 9/25.  Left message that we are discharging patient.

## 2018-07-23 NOTE — Progress Notes (Signed)
Pulmonary Individual Treatment Plan  Patient Details  Name: Julie Mcmillan MRN: [id number] Date of Birth: March 01, 1941 Referring Provider:     Pulmonary Rehab from 06/09/2018 in [facility]  Referring Provider  Rudean Hitt MD      Initial Encounter Date:    Pulmonary Rehab from 06/09/2018 in [facility] Cardiac and Pulmonary Rehab  Date  06/09/18      Visit Diagnosis: Pulmonary hypertension ([place])  Patient's Home Medications on Admission:  Current Outpatient Medications:  .  acetaminophen (TYLENOL) 325 MG tablet, Take 2 tablets (650 mg total) by mouth every 6 (six) hours as needed for mild pain (or Fever >/= 101)., Disp: , Rfl:  .  apixaban (ELIQUIS) 5 MG TABS tablet, Take 5 mg by mouth 2 (two) times daily., Disp: , Rfl:  .  Calcium Carbonate-Vitamin D (CALCIUM 600+D) 600-200 MG-UNIT TABS, Take 1 tablet by mouth daily., Disp: , Rfl:  .  cephALEXin (KEFLEX) 250 MG capsule, Two tablets three times a day for five more days then one tablet daily afterwards, Disp: 55 capsule, Rfl: 0 .  cholestyramine (QUESTRAN) 4 GM/DOSE powder, Take 4 g by mouth every morning. , Disp: , Rfl:  .  feeding supplement, ENSURE ENLIVE, (ENSURE ENLIVE) LIQD, Take 237 mLs 2 (two) times daily between meals by mouth. (Patient not taking: Reported on 02/09/2018), Disp: 237 mL, Rfl: 12 .  FLUoxetine (PROZAC) 20 MG capsule, Take 20 mg by mouth daily., Disp: , Rfl:  .  furosemide (LASIX) 20 MG tablet, Take 1 tablet daily by mouth., Disp: , Rfl:  .  HYDROcodone-acetaminophen (NORCO/VICODIN) 5-325 MG tablet, Take 1 tablet by mouth every 6 (six) hours as needed for moderate pain., Disp: 10 tablet, Rfl: 0 .  levothyroxine (SYNTHROID, LEVOTHROID) 50 MCG tablet, Take 50 mcg by mouth daily before breakfast., Disp: , Rfl:  .  losartan (COZAAR) 100 MG tablet, Take 100 mg by mouth daily. , Disp: , Rfl:  .  pantoprazole (PROTONIX) 40 MG tablet, Take 1 tablet (40 mg total) by mouth daily., Disp: 90 tablet, Rfl: 1 .   predniSONE (DELTASONE) 10 MG tablet, Take 10 mg by mouth daily., Disp: , Rfl:  .  ranitidine (ZANTAC) 300 MG tablet, Take 300 mg by mouth at bedtime. , Disp: , Rfl:  .  sotalol (BETAPACE) 80 MG tablet, Take 1 tablet (80 mg total) by mouth every 12 (twelve) hours., Disp: 60 tablet, Rfl: 0 .  tadalafil, PAH, (ADCIRCA) 20 MG tablet, Take 2 tablets daily by mouth., Disp: , Rfl:   Past Medical History: Past Medical History:  Diagnosis Date  . A-fib ([place])   . Cancer ([place])    skin  . Chronic diarrhea   . Diverticulitis   . GERD (gastroesophageal reflux disease)    usually takes protonix  . Hip fracture ([place]) 11/2015   hairline fracture on right.  no surgery, just physical therapy  . Hypertension   . IBS (irritable bowel syndrome)   . IBS (irritable bowel syndrome)   . Interstitial lung disease ([place])    does not remember when  . Pulmonary fibrosis ([place])   . Pulmonary hypertension ([place])   . Seasonal affective disorder ([place])   . Shortness of breath dyspnea    with exertion  . Type 2 diabetes mellitus ([place]) 11/04/2014    Tobacco Use: Social History   Tobacco Use  Smoking Status Never Smoker  Smokeless Tobacco Never Used  Tobacco Comment   no passive smokers in home  Labs: Recent Review Flowsheet Data    Labs for ITP Cardiac and Pulmonary Rehab Latest Ref Rng & Units 10/27/2016   Hemoglobin A1c 4.8 - 5.6 % 5.3       Pulmonary Assessment Scores: Pulmonary Assessment Scores    Row Name 06/09/18 1430         ADL UCSD   ADL Phase  Entry     SOB Score total  66     Rest  0     Walk  1     Stairs  4     Bath  4     Dress  3     Shop  3       CAT Score   CAT Score  24       mMRC Score   mMRC Score  2        Pulmonary Function Assessment: Pulmonary Function Assessment - 06/09/18 1447      Breath   Bilateral Breath Sounds  Clear;Decreased    Shortness of Breath  Yes;Fear of Shortness of Breath;Limiting activity       Exercise Target Goals: Exercise Program  Goal: Individual exercise prescription set using results from initial 6 min walk test and THRR while considering  patient's activity barriers and safety.   Exercise Prescription Goal: Initial exercise prescription builds to 30-45 minutes a day of aerobic activity, 2-3 days per week.  Home exercise guidelines will be given to patient during program as part of exercise prescription that the participant will acknowledge.  Activity Barriers & Risk Stratification: Activity Barriers & Cardiac Risk Stratification - 06/09/18 1541      Activity Barriers & Cardiac Risk Stratification   Activity Barriers  Joint Problems;Other (comment);History of Falls;Balance Concerns;Shortness of Breath;Deconditioning;Muscular Weakness;Assistive Device    Comments  R shoulder dislocated from fall Nov 2018, has to carry oxygen on left side, uses walker for longer distances       6 Minute Walk: 6 Minute Walk    Row Name 06/09/18 1538         6 Minute Walk   Phase  Initial     Distance  770 feet     Walk Time  6 minutes     # of Rest Breaks  0     MPH  1.46     METS  1.5     RPE  13     Perceived Dyspnea   3     VO2 Peak  5.24     Symptoms  Yes (comment)     Comments  tired, using rolling walker for support     Resting HR  58 bpm     Resting BP  146/84     Resting Oxygen Saturation   95 %     Exercise Oxygen Saturation  during 6 min walk  83 %     Max Ex. HR  81 bpm     Max Ex. BP  146/86     2 Minute Post BP  142/80       Interval HR   1 Minute HR  73     2 Minute HR  79     3 Minute HR  79     4 Minute HR  81     5 Minute HR  80     6 Minute HR  80     2 Minute Post HR  63     Interval Heart Rate?  Yes  Interval Oxygen   Interval Oxygen?  Yes     Baseline Oxygen Saturation %  95 %     1 Minute Oxygen Saturation %  89 %     1 Minute Liters of Oxygen  3 L pulsed     2 Minute Oxygen Saturation %  86 %     2 Minute Liters of Oxygen  3 L     3 Minute Oxygen Saturation %  85 %     3  Minute Liters of Oxygen  3 L     4 Minute Oxygen Saturation %  84 %     4 Minute Liters of Oxygen  3 L     5 Minute Oxygen Saturation %  84 %     5 Minute Liters of Oxygen  3 L     6 Minute Oxygen Saturation %  83 %     6 Minute Liters of Oxygen  3 L     2 Minute Post Oxygen Saturation %  94 %     2 Minute Post Liters of Oxygen  3 L       Oxygen Initial Assessment: Oxygen Initial Assessment - 06/09/18 1445      Home Oxygen   Home Oxygen Device  Home Concentrator;Portable Concentrator;E-Tanks    Sleep Oxygen Prescription  Continuous    Liters per minute  2    Home Exercise Oxygen Prescription  Continuous    Liters per minute  3    Home at Rest Exercise Oxygen Prescription  Continuous    Liters per minute  2    Compliance with Home Oxygen Use  Yes      Initial 6 min Walk   Oxygen Used  Pulsed;Portable Concentrator    Liters per minute  3      Program Oxygen Prescription   Program Oxygen Prescription  Continuous    Liters per minute  3      Intervention   Short Term Goals  To learn and exhibit compliance with exercise, home and travel O2 prescription;To learn and understand importance of monitoring SPO2 with pulse oximeter and demonstrate accurate use of the pulse oximeter.;To learn and understand importance of maintaining oxygen saturations>88%;To learn and demonstrate proper pursed lip breathing techniques or other breathing techniques.;To learn and demonstrate proper use of respiratory medications    Long  Term Goals  Exhibits compliance with exercise, home and travel O2 prescription;Verbalizes importance of monitoring SPO2 with pulse oximeter and return demonstration;Maintenance of O2 saturations>88%;Exhibits proper breathing techniques, such as pursed lip breathing or other method taught during program session;Compliance with respiratory medication;Demonstrates proper use of MDI's       Oxygen Re-Evaluation: Oxygen Re-Evaluation    Row Name 06/13/18 1134              Goals/Expected Outcomes   Short Term Goals  To learn and understand importance of monitoring SPO2 with pulse oximeter and demonstrate accurate use of the pulse oximeter.;To learn and understand importance of maintaining oxygen saturations>88%;To learn and demonstrate proper pursed lip breathing techniques or other breathing techniques.       Long  Term Goals  Verbalizes importance of monitoring SPO2 with pulse oximeter and return demonstration;Maintenance of O2 saturations>88%;Exhibits proper breathing techniques, such as pursed lip breathing or other method taught during program session       Comments  Reviewed PLB technique with pt.  Talked about how it work and it's important to maintaining his exercise saturations.  Goals/Expected Outcomes  Short: Become more profiecient at using PLB.   Long: Become independent at using PLB.          Oxygen Discharge (Final Oxygen Re-Evaluation): Oxygen Re-Evaluation - 06/13/18 1134      Goals/Expected Outcomes   Short Term Goals  To learn and understand importance of monitoring SPO2 with pulse oximeter and demonstrate accurate use of the pulse oximeter.;To learn and understand importance of maintaining oxygen saturations>88%;To learn and demonstrate proper pursed lip breathing techniques or other breathing techniques.    Long  Term Goals  Verbalizes importance of monitoring SPO2 with pulse oximeter and return demonstration;Maintenance of O2 saturations>88%;Exhibits proper breathing techniques, such as pursed lip breathing or other method taught during program session    Comments  Reviewed PLB technique with pt.  Talked about how it work and it's important to maintaining his exercise saturations.      Goals/Expected Outcomes  Short: Become more profiecient at using PLB.   Long: Become independent at using PLB.       Initial Exercise Prescription: Initial Exercise Prescription - 06/09/18 1500      Date of Initial Exercise RX and Referring Provider    Date  06/09/18    Referring Provider  Rudean Hitt MD      Oxygen   Oxygen  Continuous      Treadmill   MPH  1.3    Grade  0    Minutes  15    METs  2      NuStep   Level  2    SPM  80    Minutes  15    METs  2      Recumbant Elliptical   Level  1    RPM  50    Minutes  15    METs  2      Prescription Details   Frequency (times per week)  3    Duration  Progress to 45 minutes of aerobic exercise without signs/symptoms of physical distress      Intensity   THRR 40-80% of Max Heartrate  92-125    Ratings of Perceived Exertion  11-13    Perceived Dyspnea  0-4      Progression   Progression  Continue to progress workloads to maintain intensity without signs/symptoms of physical distress.      Resistance Training   Training Prescription  Yes    Weight  3 lbs    Reps  10-15       Perform Capillary Blood Glucose checks as needed.  Exercise Prescription Changes: Exercise Prescription Changes    Row Name 06/09/18 1500 06/25/18 1200           Response to Exercise   Blood Pressure (Admit)  146/84  126/64      Blood Pressure (Exercise)  146/86  132/70      Blood Pressure (Exit)  142/80  126/64      Heart Rate (Admit)  58 bpm  56 bpm      Heart Rate (Exercise)  81 bpm  83 bpm      Heart Rate (Exit)  66 bpm  55 bpm      Oxygen Saturation (Admit)  95 %  94 %      Oxygen Saturation (Exercise)  83 %  92 %      Oxygen Saturation (Exit)  94 %  98 %      Rating of Perceived Exertion (Exercise)  13  11  Perceived Dyspnea (Exercise)  3  2      Symptoms  tired, used rolling walker  SOB, fatigue      Comments  walk test results  -      Duration  -  Progress to 45 minutes of aerobic exercise without signs/symptoms of physical distress      Intensity  -  THRR unchanged        Progression   Progression  -  Continue to progress workloads to maintain intensity without signs/symptoms of physical distress.      Average METs  -  1.7        Resistance Training    Training Prescription  -  Yes      Weight  -  3 lbs      Reps  -  10-15        Interval Training   Interval Training  -  No        Oxygen   Oxygen  -  Continuous      Liters  -  3        Treadmill   MPH  -  1.3      Grade  -  0      Minutes  -  15      METs  -  2        NuStep   Level  -  2      Minutes  -  15      METs  -  1.7        Recumbant Elliptical   Level  -  1      Minutes  -  15      METs  -  1.4        Home Exercise Plan   Plans to continue exercise at  -  Home (comment) walking      Frequency  -  Add 1 additional day to program exercise sessions.      Initial Home Exercises Provided  -  06/25/18         Exercise Comments: Exercise Comments    Row Name 06/13/18 1134           Exercise Comments  First full day of exercise!  Patient was oriented to gym and equipment including functions, settings, policies, and procedures.  Patient's individual exercise prescription and treatment plan were reviewed.  All starting workloads were established based on the results of the 6 minute walk test done at initial orientation visit.  The plan for exercise progression was also introduced and progression will be customized based on patient's performance and goals.          Exercise Goals and Review: Exercise Goals    Row Name 06/09/18 1546             Exercise Goals   Increase Physical Activity  Yes       Intervention  Provide advice, education, support and counseling about physical activity/exercise needs.;Develop an individualized exercise prescription for aerobic and resistive training based on initial evaluation findings, risk stratification, comorbidities and participant's personal goals.       Expected Outcomes  Short Term: Attend rehab on a regular basis to increase amount of physical activity.;Long Term: Add in home exercise to make exercise part of routine and to increase amount of physical activity.;Long Term: Exercising regularly at least 3-5 days a week.        Increase Strength and Stamina  Yes  Intervention  Provide advice, education, support and counseling about physical activity/exercise needs.;Develop an individualized exercise prescription for aerobic and resistive training based on initial evaluation findings, risk stratification, comorbidities and participant's personal goals.       Expected Outcomes  Short Term: Increase workloads from initial exercise prescription for resistance, speed, and METs.;Short Term: Perform resistance training exercises routinely during rehab and add in resistance training at home;Long Term: Improve cardiorespiratory fitness, muscular endurance and strength as measured by increased METs and functional capacity (6MWT)       Able to understand and use rate of perceived exertion (RPE) scale  Yes       Intervention  Provide education and explanation on how to use RPE scale       Expected Outcomes  Short Term: Able to use RPE daily in rehab to express subjective intensity level;Long Term:  Able to use RPE to guide intensity level when exercising independently       Able to understand and use Dyspnea scale  Yes       Intervention  Provide education and explanation on how to use Dyspnea scale       Expected Outcomes  Short Term: Able to use Dyspnea scale daily in rehab to express subjective sense of shortness of breath during exertion;Long Term: Able to use Dyspnea scale to guide intensity level when exercising independently       Knowledge and understanding of Target Heart Rate Range (THRR)  Yes       Intervention  Provide education and explanation of THRR including how the numbers were predicted and where they are located for reference       Expected Outcomes  Short Term: Able to state/look up THRR;Short Term: Able to use daily as guideline for intensity in rehab;Long Term: Able to use THRR to govern intensity when exercising independently       Able to check pulse independently  Yes       Intervention  Provide  education and demonstration on how to check pulse in carotid and radial arteries.;Review the importance of being able to check your own pulse for safety during independent exercise       Expected Outcomes  Short Term: Able to explain why pulse checking is important during independent exercise;Long Term: Able to check pulse independently and accurately       Understanding of Exercise Prescription  Yes       Intervention  Provide education, explanation, and written materials on patient's individual exercise prescription       Expected Outcomes  Short Term: Able to explain program exercise prescription;Long Term: Able to explain home exercise prescription to exercise independently          Exercise Goals Re-Evaluation : Exercise Goals Re-Evaluation    Row Name 06/13/18 1134 06/25/18 1232 07/07/18 1640 07/22/18 1538       Exercise Goal Re-Evaluation   Exercise Goals Review  Knowledge and understanding of Target Heart Rate Range (THRR);Increase Strength and Stamina;Increase Physical Activity;Able to understand and use rate of perceived exertion (RPE) scale;Able to understand and use Dyspnea scale  Increase Physical Activity;Increase Strength and Stamina;Able to understand and use Dyspnea scale;Able to check pulse independently;Understanding of Exercise Prescription;Knowledge and understanding of Target Heart Rate Range (THRR);Able to understand and use rate of perceived exertion (RPE) scale  -  -    Comments  Reviewed RPE scale, THR and program prescription with pt today.  Pt voiced understanding and was given a copy of goals to take  home.   Reviewed home exercise with pt today.  Pt plans to walk at home for exercise.  Reviewed THR, pulse, RPE, sign and symptoms, NTG use, and when to call 911 or MD.  Also discussed weather considerations and indoor options.  Pt voiced understanding.  out since last review  out since last review    Expected Outcomes  Short: Use RPE daily to regulate intensity. Long: Follow  program prescription in THR.  Short: patient will add one day of exercise outside of LW. Long: patient will become independent with exercise   -  -       Discharge Exercise Prescription (Final Exercise Prescription Changes): Exercise Prescription Changes - 06/25/18 1200      Response to Exercise   Blood Pressure (Admit)  126/64    Blood Pressure (Exercise)  132/70    Blood Pressure (Exit)  126/64    Heart Rate (Admit)  56 bpm    Heart Rate (Exercise)  83 bpm    Heart Rate (Exit)  55 bpm    Oxygen Saturation (Admit)  94 %    Oxygen Saturation (Exercise)  92 %    Oxygen Saturation (Exit)  98 %    Rating of Perceived Exertion (Exercise)  11    Perceived Dyspnea (Exercise)  2    Symptoms  SOB, fatigue    Duration  Progress to 45 minutes of aerobic exercise without signs/symptoms of physical distress    Intensity  THRR unchanged      Progression   Progression  Continue to progress workloads to maintain intensity without signs/symptoms of physical distress.    Average METs  1.7      Resistance Training   Training Prescription  Yes    Weight  3 lbs    Reps  10-15      Interval Training   Interval Training  No      Oxygen   Oxygen  Continuous    Liters  3      Treadmill   MPH  1.3    Grade  0    Minutes  15    METs  2      NuStep   Level  2    Minutes  15    METs  1.7      Recumbant Elliptical   Level  1    Minutes  15    METs  1.4      Home Exercise Plan   Plans to continue exercise at  Home (comment)   walking   Frequency  Add 1 additional day to program exercise sessions.    Initial Home Exercises Provided  06/25/18       Nutrition:  Target Goals: Understanding of nutrition guidelines, daily intake of sodium <15104m, cholesterol <2040m calories 30% from fat and 7% or less from saturated fats, daily to have 5 or more servings of fruits and vegetables.  Biometrics: Pre Biometrics - 06/09/18 1546      Pre Biometrics   Height  5' 4.5" (1.638 m)    Weight   151 lb 11.2 oz (68.8 kg)    Waist Circumference  34.5 inches    Hip Circumference  41 inches    Waist to Hip Ratio  0.84 %    BMI (Calculated)  25.65    Single Leg Stand  0 seconds        Nutrition Therapy Plan and Nutrition Goals: Nutrition Therapy & Goals - 06/25/18 1532  Nutrition Therapy   Diet  DASH    Protein (specify units)  9oz    Fiber  25 grams    Whole Grain Foods  3 servings   does not always choose whole grains   Saturated Fats  12 max. grams    Fruits and Vegetables  5 servings/day   8 ideal; eats more vegetables than fruits   Sodium  1500 grams      Personal Nutrition Goals   Nutrition Goal  Work to reduce the amount of added sugars in your diet, for example: decrease the number of days per week you eat ice cream and/or swap at least 1/2 the sugar in your sweet tea for stevia zero calorie sweetener. This will help reduce empty calories as well, which increases your chances of maintaining your CBW as desired    Personal Goal #2  To reduce your daily sodium intake, try buying frozen vegetables rather than canned and also look for low salt snack foods like your Triscuits & cheese    Comments  She enjoys cooking but energy levels vary d/t health status, so she has learned to cook enough food for 2-3 days at a time. H/o 12# wt loss within the past year d/t being placed in a rehab program which she has since regained and would like to maintain. 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 She prepares well-balanced meals at home, which are typically smaller and more frequent. Likes oatmeal or english muffin breakfast sandwich with egg and [place] sausage. She is trying to work on reducing salt and sugar in her diet in order to help maintain her weight and improve overall health      Intervention Plan   Intervention  Prescribe, educate and counsel regarding individualized specific dietary modifications aiming towards targeted core  components such as weight, hypertension, lipid management, diabetes, heart failure and other comorbidities.;Nutrition handout(s) given to patient.   Lower sodium swaps sheet; Low sodium diet guidelines handout   Expected Outcomes  Short Term Goal: A plan has been developed with personal nutrition goals set during dietitian appointment.;Long Term Goal: Adherence to prescribed nutrition plan.       Nutrition Assessments: Nutrition Assessments - 06/09/18 1433      MEDFICTS Scores   Pre Score  54       Nutrition Goals Re-Evaluation: Nutrition Goals Re-Evaluation    [place] Name 06/25/18 1548             Goals   Nutrition Goal  Work to reduce the amount of added sugars in your diet, for example: decrease the number of days per week you eat ice cream and/or swap at least 1/2 the sugar in your sweet tea for stevia zero calorie sweetener. This will help reduce empty calories as well, which increases your chances of maintaining your CBW as desired       Comment  She had gotten into the habit of eating ice cream daily while trying to re-gain body weight and now finds it difficult to stop. Her appetite has greatly improved       Expected Outcome  She will eat foods with added sugar such as ice cream on occasion, but not necessarily daily. She will maintain CBW +/- 5# as desired         Personal Goal #2 Re-Evaluation   Personal Goal #2  To reduce your daily sodium intake, trying buying frozen vegetables rather than canned but also look  for low salt snack foods like your Triscuits and cheese          Nutrition Goals Discharge (Final Nutrition Goals Re-Evaluation): Nutrition Goals Re-Evaluation - 06/25/18 1548      Goals   Nutrition Goal  Work to reduce the amount of added sugars in your diet, for example: decrease the number of days per week you eat ice cream and/or swap at least 1/2 the sugar in your sweet tea for stevia zero calorie sweetener. This will help reduce empty calories as well, which  increases your chances of maintaining your CBW as desired    Comment  She had gotten into the habit of eating ice cream daily while trying to re-gain body weight and now finds it difficult to stop. Her appetite has greatly improved    Expected Outcome  She will eat foods with added sugar such as ice cream on occasion, but not necessarily daily. She will maintain CBW +/- 5# as desired      Personal Goal #2 Re-Evaluation   Personal Goal #2  To reduce your daily sodium intake, trying buying frozen vegetables rather than canned but also look for low salt snack foods like your Triscuits and cheese       Psychosocial: Target Goals: Acknowledge presence or absence of significant depression and/or stress, maximize coping skills, provide positive support system. Participant is able to verbalize types and ability to use techniques and skills needed for reducing stress and depression.   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 She is worried about the "END" and has been sad. She is taking diltiazem to help her with her heart .      Family Dynamics   Good Support System?  Yes    Comments  She can look to her two sons for support. She talks to her sons about every other day.       Barriers   Psychosocial barriers to participate in program  The patient should benefit from training in stress management and relaxation.      Screening Interventions   Interventions  Encouraged to exercise;To provide support and resources with identified psychosocial needs;Program counselor consult;Provide feedback about the scores to participant    Expected Outcomes  Short Term goal: Utilizing psychosocial counselor, staff and  physician to assist with identification of specific Stressors or current issues interfering with healing process. Setting desired goal for each stressor or current issue identified.;Long Term Goal: Stressors or current issues are controlled or eliminated.;Short Term goal: Identification and review with participant of any Quality of Life or Depression concerns found by scoring the questionnaire.;Long Term goal: The participant improves quality of Life and PHQ9 Scores as seen by post scores and/or verbalization of changes       Quality of Life Scores:  Scores of 19 and below usually indicate a poorer quality of life in these areas.  A difference of  2-3 points is a clinically meaningful difference.  A difference of 2-3 points in the total score of the Quality of Life Index has been associated with significant improvement in overall quality of life, self-image, physical symptoms, and general health in studies assessing change in quality of life.  PHQ-9: Recent Review Flowsheet Data    Depression screen [facility] 2/9 06/09/2018   Decreased Interest 1   Down, Depressed, Hopeless 0   PHQ - 2 Score 1   Altered sleeping 2   Tired, decreased energy 2   Change in appetite 2   Feeling bad or failure about yourself  0   Trouble concentrating 0   Moving slowly or fidgety/restless 0   Suicidal thoughts 0   PHQ-9 Score 7   Difficult doing work/chores Somewhat difficult     Interpretation of Total Score  Total Score Depression Severity:  1-4 = Minimal depression, 5-9 = Mild depression, 10-14 = Moderate depression, 15-19 = Moderately severe depression, 20-27 = Severe depression   Psychosocial Evaluation and Intervention:   Psychosocial Re-Evaluation:   Psychosocial Discharge (Final Psychosocial Re-Evaluation):   Education: Education Goals: Education classes will be provided on a weekly basis, covering required topics. Participant will state understanding/return demonstration of topics  presented.  Learning Barriers/Preferences: Learning Barriers/Preferences - 06/09/18 1455      Learning Barriers/Preferences   Learning Barriers  None    Learning Preferences  None       Education Topics:  Initial Evaluation Education: - Verbal, written and demonstration of respiratory meds, oximetry and breathing techniques. Instruction on use of nebulizers and MDIs and importance of monitoring MDI activations.   Pulmonary Rehab from 06/25/2018 in [facility] Cardiac and Pulmonary Rehab  Date  06/09/18  Educator  [facility]  Instruction Review Code  1- Verbalizes Understanding      General Nutrition Guidelines/Fats and Fiber: -Group instruction provided by verbal, written material, models and posters to present the general guidelines for heart healthy nutrition. Gives an explanation and review of dietary fats and fiber.   Controlling Sodium/Reading Food Labels: -Group verbal and written material supporting the discussion of sodium use in heart healthy nutrition. Review and explanation with models, verbal and written materials for utilization of the food label.   Exercise Physiology & General Exercise Guidelines: - Group verbal and written instruction with models to review the exercise physiology of the cardiovascular system and associated critical values. Provides general exercise guidelines with specific guidelines to those with heart or lung disease.    Pulmonary Rehab from 06/25/2018 in [facility] Cardiac and Pulmonary Rehab  Date  06/25/18  Educator  [facility]  Instruction Review Code  1- Verbalizes Understanding      Aerobic Exercise & Resistance Training: - Gives group verbal and written instruction on the various components of exercise. Focuses on aerobic and resistive training programs and the benefits of this training and how to safely progress through these programs.   Flexibility, Balance, Mind/Body Relaxation: Provides group verbal/written instruction on the benefits of flexibility and  balance training, including mind/body exercise modes such as yoga, pilates and tai chi.  Demonstration and skill practice provided.   Stress and Anxiety: - Provides group verbal and written instruction about the health risks of elevated stress and causes of high stress.  Discuss the correlation between heart/lung disease and anxiety and treatment options. Review healthy ways to manage with stress and anxiety.   Depression: - Provides group verbal and written instruction on the correlation between heart/lung disease and depressed mood, treatment options, and the stigmas associated with seeking treatment.   Exercise & Equipment Safety: - Individual verbal instruction and demonstration of equipment use and safety with use of the equipment.   Pulmonary Rehab from 06/25/2018 in [facility] Cardiac and Pulmonary Rehab  Date  06/09/18  Educator  [facility]  Instruction Review Code  1- Verbalizes Understanding      Infection Prevention: - Provides verbal and written material to individual with discussion of infection control including proper hand washing and proper equipment cleaning during exercise session.   Pulmonary Rehab from 06/25/2018 in [facility] Cardiac and Pulmonary Rehab  Date  06/09/18  Educator  [facility]  Instruction Review Code  1- Verbalizes Understanding      Falls Prevention: - Provides verbal and written material to individual with discussion of falls prevention and safety.   Pulmonary Rehab from 06/25/2018 in [facility] Cardiac and Pulmonary Rehab  Date  06/09/18  Educator  [facility]  Instruction Review Code  1- Verbalizes Understanding      Diabetes: - Individual verbal and written instruction to review signs/symptoms of diabetes, desired ranges of glucose level fasting, after meals and with exercise. Advice that pre and post exercise glucose checks will be done for 3 sessions at entry of program.   Chronic Lung Diseases: - Group verbal and written instruction to review updates, respiratory medications,  advancements in procedures and treatments. Discuss use of supplemental oxygen including available portable oxygen systems, continuous and intermittent flow rates, concentrators, personal use and safety guidelines. Review proper use of inhaler and spacers. Provide informative websites for self-education.    Pulmonary Rehab from 06/25/2018 in [facility] Cardiac and Pulmonary Rehab  Date  06/13/18  Educator  [facility]  Instruction Review Code  1- Verbalizes Understanding      Energy Conservation: - Provide group verbal and written instruction for methods to conserve energy, plan and organize activities. Instruct on pacing techniques, use of adaptive equipment and posture/positioning to relieve shortness of breath.   Triggers and Exacerbations: - Group verbal and written instruction to review types of environmental triggers and ways to prevent exacerbations. Discuss weather changes, air quality and the benefits of nasal washing. Review warning signs and symptoms to help prevent infections. Discuss techniques for effective airway clearance, coughing, and vibrations.   AED/CPR: - Group verbal and written instruction with the use of models to demonstrate the basic use of the AED with the basic ABC's of resuscitation.   Anatomy and Physiology of the Lungs: - Group verbal and written instruction with the use of models to provide basic lung anatomy and physiology related to function, structure and complications of lung disease.   Anatomy & Physiology of the Heart: - Group verbal and written instruction and models provide basic cardiac anatomy and physiology, with the coronary electrical and arterial systems. Review of Valvular disease and Heart Failure   Cardiac Medications: - Group verbal and written instruction to review commonly prescribed medications for heart disease. Reviews the medication, class of the drug, and side effects.   Know Your Numbers and Risk Factors: -Group verbal and written instruction  about important numbers in your health.  Discussion of what are risk factors and how they play a role in the disease process.  Review of Cholesterol, Blood Pressure, Diabetes, and BMI and the role they play in your overall health.   Sleep Hygiene: -Provides group verbal and written instruction about how sleep can affect your health.  Define sleep hygiene, discuss sleep cycles and impact of sleep habits. Review good sleep hygiene tips.    Other: -Provides group and verbal instruction on various topics (see comments)    Knowledge Questionnaire Score: Knowledge Questionnaire Score - 06/09/18 1432      Knowledge Questionnaire Score   Pre Score  16/18   Reviewed with patient  Core Components/Risk Factors/Patient Goals at Admission: Personal Goals and Risk Factors at Admission - 06/09/18 1456      Core Components/Risk Factors/Patient Goals on Admission    Weight Management  Yes;Weight Loss    Intervention  Weight Management: Develop a combined nutrition and exercise program designed to reach desired caloric intake, while maintaining appropriate intake of nutrient and fiber, sodium and fats, and appropriate energy expenditure required for the weight goal.;Weight Management: Provide education and appropriate resources to help participant work on and attain dietary goals.;Weight Management/Obesity: Establish reasonable short term and long term weight goals.    Admit Weight  151 lb 11.2 oz (68.8 kg)    Goal Weight: Short Term  146 lb (66.2 kg)    Goal Weight: Long Term  141 lb (64 kg)    Expected Outcomes  Short Term: Continue to assess and modify interventions until short term weight is achieved;Long Term: Adherence to nutrition and physical activity/exercise program aimed toward attainment of established weight goal;Understanding recommendations for meals to include 15-35% energy as protein, 25-35% energy from fat, 35-60% energy from carbohydrates, less than 23m of dietary cholesterol,  20-35 gm of total fiber daily;Understanding of distribution of calorie intake throughout the day with the consumption of 4-5 meals/snacks;Weight Loss: Understanding of general recommendations for a balanced deficit meal plan, which promotes 1-2 lb weight loss per week and includes a negative energy balance of [phone] kcal/d    Improve shortness of breath with ADL's  Yes    Intervention  Provide education, individualized exercise plan and daily activity instruction to help decrease symptoms of SOB with activities of daily living.    Expected Outcomes  Short Term: Improve cardiorespiratory fitness to achieve a reduction of symptoms when performing ADLs;Long Term: Be able to perform more ADLs without symptoms or delay the onset of symptoms    Diabetes  Yes   states she does not but has been diagnosed before   Intervention  Provide education about signs/symptoms and action to take for hypo/hyperglycemia.;Provide education about proper nutrition, including hydration, and aerobic/resistive exercise prescription along with prescribed medications to achieve blood glucose in normal ranges: Fasting glucose 65-99 mg/dL    Expected Outcomes  Long Term: Attainment of HbA1C < 7%.;Short Term: Participant verbalizes understanding of the signs/symptoms and immediate care of hyper/hypoglycemia, proper foot care and importance of medication, aerobic/resistive exercise and nutrition plan for blood glucose control.    Hypertension  Yes    Intervention  Monitor prescription use compliance.;Provide education on lifestyle modifcations including regular physical activity/exercise, weight management, moderate sodium restriction and increased consumption of fresh fruit, vegetables, and low fat dairy, alcohol moderation, and smoking cessation.    Expected Outcomes  Short Term: Continued assessment and intervention until BP is < 140/944mHG in hypertensive participants. < 130/8062mG in hypertensive participants with diabetes, heart  failure or chronic kidney disease.;Long Term: Maintenance of blood pressure at goal levels.       Core Components/Risk Factors/Patient Goals Review:    Core Components/Risk Factors/Patient Goals at Discharge (Final Review):    ITP Comments: ITP Comments    Row Name 06/09/18 1413 06/23/18 0843 07/07/18 1639 07/21/18 0859 07/22/18 1538   ITP Comments  Medical Evaluation completed. Chart sent for review and changes to [place]. [place] [facility] encounter 05/22/18  30 day review completed. ITP sent to Dr. [place] unless changes are made by physician  Called to check on  pt. She has been out since 9/25.  Left message  30 day review completed. ITP sent to Dr. Emily Filbert Director of [place]. Continue with ITP unless changes are made by physician.  Called to check up on pt.  Out since 9/25.  Left message.   [place] Name 07/23/18 1402 07/23/18 1403         ITP Comments  Called to check up on patient. She has been out since 9/25.  Left message that we are discharging patient. Also sent patient a discharge letter.  Medical Evaluation completed. Chart sent for review and changes to Dr. Emily Filbert Director of [place]. Diagnosis can be found in [facility] encounter         Comments: Discharge ITP

## 2018-10-22 ENCOUNTER — Telehealth: Payer: Self-pay | Admitting: Pulmonary Disease

## 2018-10-23 NOTE — Telephone Encounter (Signed)
Routed to [place] office

## 2018-10-23 NOTE — Telephone Encounter (Signed)
Returned called to patient and made aware per Dr.Gonzalez she does not specialize in Pulm Fibrosis/Htn but she will tackle it. Also, made patient aware that MR in the [place] location does specialize in these diseases. Gave patient contact information, provider name. Also advised to get CDs of recent imaging on a disk. As well as PFT and office notes. Fax # provided.

## 2018-10-23 NOTE — Telephone Encounter (Signed)
Duplicate message already sent to provider.

## 2018-11-04 ENCOUNTER — Ambulatory Visit (INDEPENDENT_AMBULATORY_CARE_PROVIDER_SITE_OTHER): Payer: Medicare HMO | Admitting: Urology

## 2018-11-04 ENCOUNTER — Encounter: Payer: Self-pay | Admitting: Urology

## 2018-11-04 VITALS — BP 108/56 | HR 58 | Ht 64.0 in | Wt 158.2 lb

## 2018-11-04 DIAGNOSIS — N321 Vesicointestinal fistula: Secondary | ICD-10-CM | POA: Diagnosis not present

## 2018-11-04 LAB — URINALYSIS, COMPLETE
Bilirubin, UA: NEGATIVE
GLUCOSE, UA: NEGATIVE
Ketones, UA: NEGATIVE
NITRITE UA: NEGATIVE
Protein, UA: NEGATIVE
UUROB: 0.2 mg/dL (ref 0.2–1.0)
pH, UA: 5 (ref 5.0–7.5)

## 2018-11-04 LAB — MICROSCOPIC EXAMINATION
Bacteria, UA: NONE SEEN
Epithelial Cells (non renal): NONE SEEN /hpf (ref 0–10)
RBC MICROSCOPIC, UA: NONE SEEN /HPF (ref 0–2)

## 2018-11-06 ENCOUNTER — Encounter: Payer: Self-pay | Admitting: Urology

## 2018-11-06 NOTE — Progress Notes (Signed)
11/04/2018 8:33 AM   Julie Mcmillan [birth year] [id number]  Referring provider: Glendon Axe, MD [facility] [place], [place] [address]  Chief Complaint  Patient presents with  . 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  She has a history of recurrent UTIs since age 48.  She had a CT of the abdomen pelvis performed in May 2019 which showed findings consistent with a colovesical fistula and bladder wall thickening.  That was felt the CT findings from May were similar to CT findings in December 2017.  She has a history of a diverticular abscess in December 2017 treated with antibiotics.  She declined surgery secondary to chronic interstitial lung disease.  She has had 4 prior episodes of diverticulitis requiring hospitalization.    She was actually seen by [facility] on 03/24/2018.  She was started on prophylactic antibiotics and referred to colorectal surgery.  She was felt to be at high risk for colon resection/possible colostomy secondary to her underlying pulmonary disease.  Long-term antibiotic suppression was recommended.  She had urology follow-up on 07/09/2018.  She is currently on Keflex suppression and is asymptomatic.  Urinalysis today was completely clear.   PMH: Past Medical History:  Diagnosis Date  . A-fib ([place])   . Cancer ([place])    skin  . Chronic diarrhea   . Diverticulitis   . GERD (gastroesophageal reflux disease)    usually takes protonix  . Hip fracture ([place]) 11/2015   hairline fracture on right.  no surgery, just physical therapy  . Hypertension   . IBS (irritable bowel syndrome)   . IBS (irritable bowel syndrome)   . Interstitial lung disease ([place])    does not remember when  . Pulmonary fibrosis ([place])   . Pulmonary hypertension ([place])   . Seasonal affective disorder ([place])   . Shortness of breath dyspnea    with exertion  . Type 2 diabetes mellitus ([place]) 11/04/2014     Surgical History: Past Surgical History:  Procedure Laterality Date  . ABDOMINAL HYSTERECTOMY     partial  . APPENDECTOMY    . BREAST BIOPSY Left    core bx- neg  . CATARACT EXTRACTION W/PHACO Right 03/16/2015   Procedure: CATARACT EXTRACTION PHACO AND INTRAOCULAR LENS PLACEMENT (IOC);  Surgeon: Leandrew Koyanagi, MD;  Location: [place];  Service: Ophthalmology;  Laterality: Right;  . CATARACT EXTRACTION W/PHACO Left 06/13/2016   Procedure: CATARACT EXTRACTION PHACO AND INTRAOCULAR LENS PLACEMENT (IOC);  Surgeon: Leandrew Koyanagi, MD;  Location: [place];  Service: Ophthalmology;  Laterality: Left;  pre-diabetic - diet controlled  . COLONOSCOPY    . COLONOSCOPY WITH PROPOFOL N/A 05/25/2016   Procedure: COLONOSCOPY WITH PROPOFOL;  Surgeon: Manya Silvas, MD;  Location: [facility] ENDOSCOPY;  Service: Endoscopy;  Laterality: N/A;  . ESOPHAGOGASTRODUODENOSCOPY (EGD) WITH PROPOFOL N/A 05/25/2016   Procedure: ESOPHAGOGASTRODUODENOSCOPY (EGD) WITH PROPOFOL;  Surgeon: Manya Silvas, MD;  Location: [facility] ENDOSCOPY;  Service: Endoscopy;  Laterality: N/A;  . NISSEN FUNDOPLICATION  [id number]   came apart in 2016 and ibs and reflux returned  . OPEN REDUCTION INTERNAL FIXATION (ORIF) DISTAL RADIAL FRACTURE Right 01/13/2016   Procedure: OPEN REDUCTION INTERNAL FIXATION (ORIF) DISTAL RADIAL FRACTURE;  Surgeon: Hessie Knows, MD;  Location: [facility] ORS;  Service: Orthopedics;  Laterality: Right;  . TONSILLECTOMY     and adenoidectomy    Home Medications:  Allergies as of 11/04/2018      Reactions  Amlodipine Swelling   Levofloxacin    Other reaction(s): Other (See Comments) No flouroquinolones while patient on sotalol    Nexium [esomeprazole Magnesium] Diarrhea   Aspartame Other (See Comments)   Patient reports "self diagnosed intolerance" to artificial sweeteners.      Medication List       Accurate as of November 04, 2018 11:59 PM. Always use your most recent med list.         acetaminophen 325 MG tablet Commonly known as:  TYLENOL Take 2 tablets (650 mg total) by mouth every 6 (six) hours as needed for mild pain (or Fever >/= 101).   ADCIRCA 20 MG tablet Generic drug:  tadalafil (PAH) Take 2 tablets daily by mouth.   busPIRone 7.5 MG tablet Commonly known as:  BUSPAR Take 7.5 mg by mouth 2 (two) times daily.   CALCIUM 600+D 600-200 MG-UNIT Tabs Generic drug:  Calcium Carbonate-Vitamin D Take 1 tablet by mouth daily.   cholestyramine 4 g packet Commonly known as:  QUESTRAN TAKE 1 PACKET BY MOUTH EVERY MORNING BEFORE BREAKFAST   diltiazem 180 MG 24 hr capsule Commonly known as:  CARDIZEM CD Take 180 mg by mouth daily.   ELIQUIS 5 MG Tabs tablet Generic drug:  apixaban Take 5 mg by mouth 2 (two) times daily.   feeding supplement (ENSURE ENLIVE) Liqd Take 237 mLs 2 (two) times daily between meals by mouth.   FLUoxetine 40 MG capsule Commonly known as:  PROZAC Take 40 mg by mouth daily.   fluticasone 50 MCG/ACT nasal spray Commonly known as:  FLONASE USE 2 SPRAY(S) IN EACH NOSTRIL ONCE DAILY   furosemide 20 MG tablet Commonly known as:  LASIX Take 1 tablet daily by mouth.   hydrocortisone 2.5 % ointment APPLY OINTMENT EXTERNALLY TO AFFECTED FACIAL AREAS TWICE DAILY AS NEEDED   ibandronate 150 MG tablet Commonly known as:  BONIVA TAKE 1 TABLET BY MOUTH EVERY 30 DAYS. TAKE WITH A FULL GLASS OF WATER. DO NOT LIE DOWN FOR THE NEXT 60 MINUTES   levothyroxine 50 MCG tablet Commonly known as:  SYNTHROID, LEVOTHROID Take 50 mcg by mouth daily before breakfast.   losartan 100 MG tablet Commonly known as:  COZAAR Take 100 mg by mouth daily.   meclizine 25 MG tablet Commonly known as:  ANTIVERT TAKE 1 TABLET BY MOUTH EVERY 8 HOURS AS NEEDED FOR VERTIGO   ondansetron 4 MG tablet Commonly known as:  ZOFRAN TAKE 1 TABLET BY MOUTH EVERY 8 HOURS AS NEEDED FOR NAUSEA   pantoprazole 40 MG tablet Commonly known as:  PROTONIX Take 1  tablet (40 mg total) by mouth daily.   ranitidine 300 MG tablet Commonly known as:  ZANTAC Take 300 mg by mouth at bedtime.   sotalol 80 MG tablet Commonly known as:  BETAPACE Take 1 tablet (80 mg total) by mouth every 12 (twelve) hours.   triamcinolone cream 0.1 % Commonly known as:  KENALOG APPLY TO RASH ON CHEST TWICE DAILY UNTIL CLEAR   TYVASO REFILL 0.6 MG/ML Soln Generic drug:  Treprostinil Inhale into the lungs.   zaleplon 5 MG capsule Commonly known as:  SONATA TAKE 1 CAPSULE BY MOUTH NIGHTLY AS NEEDED       Allergies:  Allergies  Allergen Reactions  . Amlodipine Swelling  . Levofloxacin     Other reaction(s): Other (See Comments) No flouroquinolones while patient on sotalol   . Nexium [Esomeprazole Magnesium] Diarrhea  . Aspartame Other (See Comments)    Patient reports "self  diagnosed intolerance" to artificial sweeteners.    Family History: Family History  Problem Relation Age of Onset  . Breast cancer Mother 49  . Diabetes Mellitus II Mother   . Hypothyroidism Mother   . Atrial fibrillation Mother   . Heart attack Father   . Diabetes Maternal Grandmother   . Heart attack Maternal Grandfather     Social History:  reports that she has never smoked. She has never used smokeless tobacco. She reports that she does not drink alcohol or use drugs.  ROS: UROLOGY Frequent Urination?: No Hard to postpone urination?: Yes Burning/pain with urination?: No Get up at night to urinate?: Yes Leakage of urine?: No Urine stream starts and stops?: No Trouble starting stream?: No Do you have to strain to urinate?: No Blood in urine?: No Urinary tract infection?: Yes Sexually transmitted disease?: No Injury to kidneys or bladder?: No Painful intercourse?: No Weak stream?: No Currently pregnant?: No Vaginal bleeding?: No Last menstrual period?: Postmenopausal  Gastrointestinal Nausea?: Yes Vomiting?: No Indigestion/heartburn?: Yes Diarrhea?:  Yes Constipation?: No  Constitutional Fever: No Night sweats?: No Weight loss?: No Fatigue?: Yes  Skin Skin rash/lesions?: No Itching?: No  Eyes Blurred vision?: No Double vision?: No  Ears/Nose/Throat Sore throat?: No Sinus problems?: Yes  Hematologic/Lymphatic Swollen glands?: No Easy bruising?: Yes  Cardiovascular Leg swelling?: No Chest pain?: No  Respiratory Cough?: Yes Shortness of breath?: Yes  Endocrine Excessive thirst?: No  Musculoskeletal Back pain?: No Joint pain?: No  Neurological Headaches?: No Dizziness?: Yes  Psychologic Depression?: Yes Anxiety?: Yes  Physical Exam: BP (!) 108/56 (BP Location: Left Arm, Patient Position: Sitting, Cuff Size: Normal)   Pulse (!) 58   Ht 5' 4" (1.626 m)   Wt 158 lb 3.2 oz (71.8 kg)   BMI 27.15 kg/m   Constitutional:  Alert and oriented, No acute distress. HEENT: [place] AT, moist mucus membranes.  Trachea midline, no masses. Cardiovascular: No clubbing, cyanosis, or edema. Respiratory: Normal respiratory effort, no increased work of breathing. GI: Abdomen is soft, nontender, nondistended, no abdominal masses GU: No CVA tenderness Lymph: No cervical or inguinal lymphadenopathy. Skin: No rashes, bruises or suspicious lesions. Neurologic: Grossly intact, no focal deficits, moving all 4 extremities. Psychiatric: Normal mood and affect.  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  She has been evaluated by colorectal surgery and is not felt to be a surgical candidate secondary to her comorbidities.  Urinalysis today is clear.  Will continue antibiotic prophylaxis.  She will follow-up as needed.  With her history of significant diverticular disease/abscess I do not feel cystoscopy would provide any additional information at this  time.   Abbie Sons, [place] [address] [place], [place] [address] [phone]

## 2018-11-07 ENCOUNTER — Other Ambulatory Visit: Payer: Self-pay

## 2018-11-07 ENCOUNTER — Inpatient Hospital Stay
Admission: EM | Admit: 2018-11-07 | Discharge: 2018-11-08 | DRG: 536 | Disposition: A | Discharge status: Other Acute Inpatient Hospital | Payer: Medicare HMO | Attending: Internal Medicine | Admitting: Internal Medicine

## 2018-11-07 ENCOUNTER — Encounter: Payer: Self-pay | Admitting: Emergency Medicine

## 2018-11-07 ENCOUNTER — Emergency Department: Payer: Medicare HMO

## 2018-11-07 DIAGNOSIS — W19XXXA Unspecified fall, initial encounter: Secondary | ICD-10-CM

## 2018-11-07 DIAGNOSIS — W1811XA Fall from or off toilet without subsequent striking against object, initial encounter: Secondary | ICD-10-CM | POA: Diagnosis present

## 2018-11-07 DIAGNOSIS — S72002A Fracture of unspecified part of neck of left femur, initial encounter for closed fracture: Secondary | ICD-10-CM | POA: Diagnosis present

## 2018-11-07 DIAGNOSIS — Z9089 Acquired absence of other organs: Secondary | ICD-10-CM

## 2018-11-07 DIAGNOSIS — S72142A Displaced intertrochanteric fracture of left femur, initial encounter for closed fracture: Secondary | ICD-10-CM | POA: Diagnosis not present

## 2018-11-07 DIAGNOSIS — Z66 Do not resuscitate: Secondary | ICD-10-CM | POA: Diagnosis present

## 2018-11-07 DIAGNOSIS — Z803 Family history of malignant neoplasm of breast: Secondary | ICD-10-CM | POA: Diagnosis not present

## 2018-11-07 DIAGNOSIS — J8410 Pulmonary fibrosis, unspecified: Secondary | ICD-10-CM | POA: Diagnosis present

## 2018-11-07 DIAGNOSIS — I152 Hypertension secondary to endocrine disorders: Secondary | ICD-10-CM | POA: Diagnosis present

## 2018-11-07 DIAGNOSIS — K219 Gastro-esophageal reflux disease without esophagitis: Secondary | ICD-10-CM | POA: Diagnosis present

## 2018-11-07 DIAGNOSIS — I272 Pulmonary hypertension, unspecified: Secondary | ICD-10-CM | POA: Diagnosis present

## 2018-11-07 DIAGNOSIS — Y92009 Unspecified place in unspecified non-institutional (private) residence as the place of occurrence of the external cause: Secondary | ICD-10-CM | POA: Diagnosis not present

## 2018-11-07 DIAGNOSIS — F419 Anxiety disorder, unspecified: Secondary | ICD-10-CM | POA: Diagnosis present

## 2018-11-07 DIAGNOSIS — Z8249 Family history of ischemic heart disease and other diseases of the circulatory system: Secondary | ICD-10-CM

## 2018-11-07 DIAGNOSIS — N3000 Acute cystitis without hematuria: Secondary | ICD-10-CM | POA: Diagnosis present

## 2018-11-07 DIAGNOSIS — Z9842 Cataract extraction status, left eye: Secondary | ICD-10-CM

## 2018-11-07 DIAGNOSIS — Z90710 Acquired absence of both cervix and uterus: Secondary | ICD-10-CM | POA: Diagnosis not present

## 2018-11-07 DIAGNOSIS — E039 Hypothyroidism, unspecified: Secondary | ICD-10-CM | POA: Diagnosis present

## 2018-11-07 DIAGNOSIS — E119 Type 2 diabetes mellitus without complications: Secondary | ICD-10-CM | POA: Diagnosis present

## 2018-11-07 DIAGNOSIS — Z833 Family history of diabetes mellitus: Secondary | ICD-10-CM

## 2018-11-07 DIAGNOSIS — Z9981 Dependence on supplemental oxygen: Secondary | ICD-10-CM

## 2018-11-07 DIAGNOSIS — I10 Essential (primary) hypertension: Secondary | ICD-10-CM | POA: Diagnosis present

## 2018-11-07 DIAGNOSIS — J849 Interstitial pulmonary disease, unspecified: Secondary | ICD-10-CM | POA: Diagnosis present

## 2018-11-07 DIAGNOSIS — Z961 Presence of intraocular lens: Secondary | ICD-10-CM | POA: Diagnosis present

## 2018-11-07 DIAGNOSIS — I480 Paroxysmal atrial fibrillation: Secondary | ICD-10-CM | POA: Diagnosis present

## 2018-11-07 LAB — URINALYSIS, COMPLETE (UACMP) WITH MICROSCOPIC
Bilirubin Urine: NEGATIVE
Glucose, UA: NEGATIVE mg/dL
Ketones, ur: NEGATIVE mg/dL
Nitrite: NEGATIVE
Protein, ur: NEGATIVE mg/dL
SQUAMOUS EPITHELIAL / LPF: NONE SEEN (ref 0–5)
Specific Gravity, Urine: 1.008 (ref 1.005–1.030)
pH: 5 (ref 5.0–8.0)

## 2018-11-07 LAB — BASIC METABOLIC PANEL
Anion gap: 10 (ref 5–15)
BUN: 15 mg/dL (ref 8–23)
CALCIUM: 8.9 mg/dL (ref 8.9–10.3)
CO2: 24 mmol/L (ref 22–32)
Chloride: 101 mmol/L (ref 98–111)
Creatinine, Ser: 0.94 mg/dL (ref 0.44–1.00)
GFR calc Af Amer: >60 mL/min (ref >60)
GFR calc non Af Amer: 58 mL/min — ABNORMAL LOW (ref >60)
Glucose, Bld: 238 mg/dL — ABNORMAL HIGH (ref 70–99)
Potassium: 4.2 mmol/L (ref 3.5–5.1)
Sodium: 135 mmol/L (ref 135–145)

## 2018-11-07 LAB — CBC
HCT: 44.6 % (ref 36.0–46.0)
Hemoglobin: 14 g/dL (ref 12.0–15.0)
MCH: 26.2 pg (ref 26.0–34.0)
MCHC: 31.4 g/dL (ref 30.0–36.0)
MCV: 83.5 fL (ref 80.0–100.0)
Platelets: 314 10*3/uL (ref 150–400)
RBC: 5.34 MIL/uL — ABNORMAL HIGH (ref 3.87–5.11)
RDW: 13.6 % (ref 11.5–15.5)
WBC: 17.3 10*3/uL — ABNORMAL HIGH (ref 4.0–10.5)
nRBC: 0 % (ref 0.0–0.2)

## 2018-11-07 MED ORDER — HYDROMORPHONE HCL 1 MG/ML IJ SOLN
1.0000 mg | Freq: Once | INTRAMUSCULAR | Status: AC
  Administered 2018-11-07: 1 mg via INTRAVENOUS
  Filled 2018-11-07: qty 1

## 2018-11-07 MED ORDER — SODIUM CHLORIDE 0.9 % IV BOLUS
1000.0000 mL | Freq: Once | INTRAVENOUS | Status: AC
  Administered 2018-11-07: 1000 mL via INTRAVENOUS

## 2018-11-07 MED ORDER — ONDANSETRON HCL 4 MG PO TABS: 4.0000 mg | ORAL_TABLET | Freq: Four times a day (QID) | ORAL | Status: DC | PRN

## 2018-11-07 MED ORDER — FENTANYL CITRATE (PF) 100 MCG/2ML IJ SOLN
50.0000 ug | Freq: Once | INTRAMUSCULAR | Status: AC
  Administered 2018-11-07: 50 ug via INTRAVENOUS
  Filled 2018-11-07: qty 2

## 2018-11-07 MED ORDER — OXYCODONE HCL 5 MG PO TABS
5.0000 mg | ORAL_TABLET | ORAL | Status: DC | PRN
  Administered 2018-11-08 (×4): 5 mg via ORAL
  Filled 2018-11-07 (×4): qty 1

## 2018-11-07 MED ORDER — HYDROMORPHONE HCL 1 MG/ML IJ SOLN
0.5000 mg | INTRAMUSCULAR | Status: DC | PRN
  Administered 2018-11-07 – 2018-11-08 (×4): 0.5 mg via INTRAVENOUS
  Filled 2018-11-07 (×4): qty 1

## 2018-11-07 MED ORDER — ONDANSETRON HCL 4 MG/2ML IJ SOLN
4.0000 mg | Freq: Four times a day (QID) | INTRAMUSCULAR | Status: DC | PRN
  Administered 2018-11-08: 4 mg via INTRAVENOUS
  Filled 2018-11-07: qty 2

## 2018-11-07 MED ORDER — ACETAMINOPHEN 325 MG PO TABS: 650.0000 mg | ORAL_TABLET | Freq: Four times a day (QID) | ORAL | Status: DC | PRN

## 2018-11-07 MED ORDER — ACETAMINOPHEN 650 MG RE SUPP: 650.0000 mg | Freq: Four times a day (QID) | RECTAL | Status: DC | PRN

## 2018-11-07 MED ORDER — SODIUM CHLORIDE 0.9 % IV SOLN
1.0000 g | Freq: Once | INTRAVENOUS | Status: AC
  Administered 2018-11-07: 1 g via INTRAVENOUS
  Filled 2018-11-07: qty 10

## 2018-11-07 MED ORDER — ENOXAPARIN SODIUM 40 MG/0.4ML ~~LOC~~ SOLN: 40.0000 mg | SUBCUTANEOUS | Status: DC

## 2018-11-07 NOTE — ED Triage Notes (Signed)
Pt presents to ED via [facility] c/o fall with L hip pain. Pt states she was trying to sit on toilet and missed, landing on L side. Pt arrives with L knee flexed, states she is unable to straighten leg d/t pain.

## 2018-11-07 NOTE — ED Notes (Signed)
ED TO INPATIENT HANDOFF REPORT  Name/Age/Gender Julie Mcmillan 78 y.o. female  Code Status Code Status History    Date Active Date Inactive Code Status Order ID Comments User Context   02/10/2018 0146 02/12/2018 1908 Full Code [id number]  Amelia Jo, MD Inpatient   08/12/2017 0500 08/16/2017 1730 Full Code [id number]  Saundra Shelling, MD Inpatient   10/27/2016 0039 10/27/2016 1822 Full Code [id number]  Lance Coon, MD Inpatient   08/31/2016 2218 09/03/2016 1808 Full Code [id number]  Hubbard Robinson, MD ED   11/26/2015 0002 11/29/2015 2100 Full Code [id number]  Hillary Bow, MD ED   07/04/2015 1717 07/10/2015 2011 Full Code [id number]  Marlyce Huge, MD ED   03/25/2015 1947 03/29/2015 2044 Full Code [id number]  Vaughan Basta, MD Inpatient    Advance Directive Documentation     Most Recent Value  Type of Advance Directive  Healthcare Power of Attorney, Living will  Pre-existing out of facility DNR order (yellow form or pink MOST form)  -  "MOST" Form in Place?  -      Home/SNF/Other Home  Chief Complaint Fall  Level of Care/Admitting Diagnosis ED Disposition    ED Disposition Condition [place]: [place] [100120]  Level of Care: Med-Surg [16]  Diagnosis: Closed left hip fracture [facility]) [175102]  Admitting Physician: Lance Coon [[id number]]  Attending Physician: Lance Coon [phone]  Estimated length of stay: past midnight tomorrow  Certification:: I certify this patient will need inpatient services for at least 2 midnights  PT Class (Do Not Modify): Inpatient [101]  PT Acc Code (Do Not Modify): Private [1]       Medical History Past Medical History:  Diagnosis Date  . A-fib ([place])   . Cancer ([place])    skin  . Chronic diarrhea   . Diverticulitis   . GERD (gastroesophageal reflux disease)    usually takes protonix  . Hip fracture ([place]) 11/2015   hairline fracture on right.  no surgery, just physical therapy   . Hypertension   . IBS (irritable bowel syndrome)   . IBS (irritable bowel syndrome)   . Interstitial lung disease ([place])    does not remember when  . Pulmonary fibrosis ([place])   . Pulmonary hypertension ([place])   . Seasonal affective disorder ([place])   . Shortness of breath dyspnea    with exertion  . Type 2 diabetes mellitus ([place]) 11/04/2014   pt states this has resolved with diet/exercise 11/07/18    Allergies Allergies  Allergen Reactions  . Amlodipine Swelling  . Levofloxacin Other (See Comments)    Other reaction(s): Other (See Comments) No flouroquinolones while patient on sotalol   . Nexium [Esomeprazole Magnesium] Diarrhea  . Aspartame Other (See Comments)    Patient reports "self diagnosed intolerance" to artificial sweeteners.    IV Location/Drains/Wounds Patient Lines/Drains/Airways Status   Active Line/Drains/Airways    Name:   Placement date:   Placement time:   Site:   Days:   Peripheral IV 11/07/18 Right Wrist   11/07/18    2000    Wrist   less than 1          Labs/Imaging Results for orders placed or performed during the hospital encounter of 11/07/18 (from the past 48 hour(s))  Urinalysis, Complete w Microscopic     Status: Abnormal   Collection Time: 11/07/18  8:11 PM  Result Value Ref Range   Color, Urine STRAW (A) YELLOW   APPearance HAZY (A) CLEAR  Specific Gravity, Urine 1.008 1.005 - 1.030   pH 5.0 5.0 - 8.0   Glucose, UA NEGATIVE NEGATIVE mg/dL   Hgb urine dipstick SMALL (A) NEGATIVE   Bilirubin Urine NEGATIVE NEGATIVE   Ketones, ur NEGATIVE NEGATIVE mg/dL   Protein, ur NEGATIVE NEGATIVE mg/dL   Nitrite NEGATIVE NEGATIVE   Leukocytes, UA TRACE (A) NEGATIVE   RBC / HPF 0-5 0 - 5 RBC/hpf   WBC, UA 0-5 0 - 5 WBC/hpf   Bacteria, UA FEW (A) NONE SEEN   Squamous Epithelial / LPF NONE SEEN 0 - 5   Mucus PRESENT     Comment: Performed at [facility], [address] [place], [place] [address]  CBC     Status: Abnormal   Collection Time:  11/07/18  8:11 PM  Result Value Ref Range   WBC 17.3 (H) 4.0 - 10.5 K/uL   RBC 5.34 (H) 3.87 - 5.11 MIL/uL   Hemoglobin 14.0 12.0 - 15.0 g/dL   HCT 44.6 36.0 - 46.0 %   MCV 83.5 80.0 - 100.0 fL   MCH 26.2 26.0 - 34.0 pg   MCHC 31.4 30.0 - 36.0 g/dL   RDW 13.6 11.5 - 15.5 %   Platelets 314 150 - 400 K/uL   nRBC 0.0 0.0 - 0.2 %    Comment: Performed at [facility], [address] [place], [place] [address]  Basic metabolic panel     Status: Abnormal   Collection Time: 11/07/18  8:11 PM  Result Value Ref Range   Sodium 135 135 - 145 mmol/L   Potassium 4.2 3.5 - 5.1 mmol/L   Chloride 101 98 - 111 mmol/L   CO2 24 22 - 32 mmol/L   Glucose, Bld 238 (H) 70 - 99 mg/dL   BUN 15 8 - 23 mg/dL   Creatinine, Ser 0.94 0.44 - 1.00 mg/dL   Calcium 8.9 8.9 - 10.3 mg/dL   GFR calc non Af Amer 58 (L) >60 mL/min   GFR calc Af Amer >60 >60 mL/min   Anion gap 10 5 - 15    Comment: Performed at [facility], [address] [place], [place] [address]   Dg Hip Unilat With Pelvis 2-3 Views Left  Result Date: 11/07/2018 CLINICAL DATA:  Fall, left hip pain EXAM: DG HIP (WITH OR WITHOUT PELVIS) 2-3V LEFT COMPARISON:  None. FINDINGS: There is a left femoral neck/intertrochanteric fracture with mild varus angulation. No subluxation or dislocation. Mild joint space narrowing and spurring in the hip joints bilaterally, symmetric. SI joints symmetric and unremarkable. IMPRESSION: Left femoral neck/intertrochanteric fracture with varus angulation. Electronically Signed   By: Rolm Baptise M.D.   On: 11/07/2018 20:49    Pending Labs Unresulted Labs (From admission, onward)    Start     Ordered   Signed and Held  CBC  (enoxaparin (LOVENOX)    CrCl >/= 30 ml/min)  Once,   R    Comments:  Baseline for enoxaparin therapy IF NOT ALREADY DRAWN.  Notify MD if PLT < 100 K.    Signed and Held   Signed and Held  Creatinine, serum  (enoxaparin (LOVENOX)    CrCl >/= 30 ml/min)  Once,   R    Comments:   Baseline for enoxaparin therapy IF NOT ALREADY DRAWN.    Signed and Held   Signed and Held  Creatinine, serum  (enoxaparin (LOVENOX)    CrCl >/= 30 ml/min)  Weekly,   R    Comments:  while on enoxaparin therapy    Signed and Held   Signed and Held  Basic metabolic panel  Tomorrow morning,   R     Signed and Held   Signed and Held  CBC  Tomorrow morning,   R     Signed and Held          Vitals/Pain Today's Vitals   11/07/18 2014 11/07/18 2143 11/07/18 2200 11/07/18 2300  BP: (!) 152/59  (!) 173/88 (!) 164/70  Pulse: 60  60 (!) 58  Resp: _0 Temp:      SpO2: 98%  97% 99%  Weight:      Height:      PainSc: 5  4  Asleep 8     Isolation Precautions No active isolations  Medications Medications  fentaNYL (SUBLIMAZE) injection 50 mcg (50 mcg Intravenous Given 11/07/18 2005)  sodium chloride 0.9 % bolus 1,000 mL (0 mLs Intravenous Stopped 11/07/18 2106)  cefTRIAXone (ROCEPHIN) 1 g in sodium chloride 0.9 % 100 mL IVPB (0 g Intravenous Stopped 11/07/18 2144)  HYDROmorphone (DILAUDID) injection 1 mg (1 mg Intravenous Given 11/07/18 2113)    Mobility walks

## 2018-11-07 NOTE — ED Notes (Signed)
admitting Provider at bedside.

## 2018-11-07 NOTE — ED Provider Notes (Addendum)
[facility] Emergency Department Provider Note  ____________________________________________  Time seen: Approximately 7:46 PM  I have reviewed the triage vital signs and the nursing notes.   HISTORY  Chief Complaint Fall    HPI Julie Mcmillan is a 78 y.o. female with a history of pulmonary hypertension, pulmonary fibrosis requiring 2 to 3 L nasal cannula, atrial fibrillation, colovaginal fistula, presenting with left hip pain after a fall.  The patient reports she bent down to sit on the toilet, and missed the toilet, falling onto her left hip.  She did not strike her head or lose consciousness.  She did not have any associated chest pain, shortness of breath, lightheadedness or syncope.  At this time, she has pain isolated to the left hip.  She denies any neck or back pain, headache, nausea or vomiting.  Past Medical History:  Diagnosis Date  . A-fib ([place])   . Cancer ([place])    skin  . Chronic diarrhea   . Diverticulitis   . GERD (gastroesophageal reflux disease)    usually takes protonix  . Hip fracture ([place]) 11/2015   hairline fracture on right.  no surgery, just physical therapy  . Hypertension   . IBS (irritable bowel syndrome)   . IBS (irritable bowel syndrome)   . Interstitial lung disease ([place])    does not remember when  . Pulmonary fibrosis ([place])   . Pulmonary hypertension ([place])   . Seasonal affective disorder ([place])   . Shortness of breath dyspnea    with exertion  . Type 2 diabetes mellitus ([place]) 11/04/2014   pt states this has resolved with diet/exercise 11/07/18    Patient Active Problem List   Diagnosis Date Noted  . Enterovesical fistula   . Acute UTI 02/09/2018  . Acute diverticulitis 08/12/2017  . Colonic diverticular abscess   . Dyspnea 10/27/2016  . Diverticulitis of large intestine with abscess without bleeding 08/31/2016  . Interstitial lung disease ([place]) 08/31/2016  . Diverticulitis of large intestine with abscess 08/31/2016   . Depression, major, single episode, complete remission ([place]) 02/23/2016  . DOE (dyspnea on exertion) 01/26/2016  . Acute bronchitis 11/29/2015  . Acute respiratory failure with hypoxia ([place]) 11/29/2015  . Fracture of greater trochanter of right femur ([place]) 11/29/2015  . Fall 11/26/2015  . OP (osteoporosis) 07/18/2015  . 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 Adult hypothyroidism 07/18/2015  . BP (high blood pressure) 07/18/2015  . Fatigue 07/18/2015  . Anxiety 07/18/2015  . Diverticulitis large intestine 07/04/2015  . Paroxysmal atrial fibrillation ([place]) 04/22/2015  . Clinical depression 04/22/2015  . Atrial fibrillation with rapid ventricular response ([place]) 03/25/2015  . Atrial fibrillation ([place]) 03/25/2015  . Acquired hypothyroidism 01/11/2015  . Anemia, iron deficiency 01/10/2015  . Benign essential HTN 01/10/2015  . Type 2 diabetes mellitus ([place]) 11/04/2014  . 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 Essential (primary) hypertension 11/04/2014  . Temporary cerebral vascular dysfunction 04/08/2014  . Dry mouth 01/05/2013  . Cannot sleep 01/05/2013  . Obstructive apnea 09/02/2012  . Bursitis, ischial 05/19/2012  . Acid reflux 02/01/2012  . DD (diverticular disease) 08/14/2011    Past Surgical History:  Procedure Laterality Date  . ABDOMINAL HYSTERECTOMY     partial  . APPENDECTOMY    . BREAST BIOPSY Left    core bx- neg  . CATARACT EXTRACTION W/PHACO Right 03/16/2015   Procedure: CATARACT EXTRACTION PHACO AND INTRAOCULAR LENS PLACEMENT (IOC);  Surgeon: Leandrew Koyanagi, MD;  Location:  [place];  Service: Ophthalmology;  Laterality: Right;  . CATARACT EXTRACTION W/PHACO Left 06/13/2016   Procedure: CATARACT EXTRACTION PHACO AND INTRAOCULAR LENS PLACEMENT (IOC);  Surgeon: Leandrew Koyanagi, MD;  Location: [place];  Service: Ophthalmology;  Laterality: Left;  pre-diabetic - diet controlled  .  COLONOSCOPY    . COLONOSCOPY WITH PROPOFOL N/A 05/25/2016   Procedure: COLONOSCOPY WITH PROPOFOL;  Surgeon: Manya Silvas, MD;  Location: [facility] ENDOSCOPY;  Service: Endoscopy;  Laterality: N/A;  . ESOPHAGOGASTRODUODENOSCOPY (EGD) WITH PROPOFOL N/A 05/25/2016   Procedure: ESOPHAGOGASTRODUODENOSCOPY (EGD) WITH PROPOFOL;  Surgeon: Manya Silvas, MD;  Location: [facility] ENDOSCOPY;  Service: Endoscopy;  Laterality: N/A;  . NISSEN FUNDOPLICATION  1610   came apart in 2016 and ibs and reflux returned  . OPEN REDUCTION INTERNAL FIXATION (ORIF) DISTAL RADIAL FRACTURE Right 01/13/2016   Procedure: OPEN REDUCTION INTERNAL FIXATION (ORIF) DISTAL RADIAL FRACTURE;  Surgeon: Hessie Knows, MD;  Location: [facility] ORS;  Service: Orthopedics;  Laterality: Right;  . TONSILLECTOMY     and adenoidectomy    Current Outpatient Rx  . Order #: [id number] Class: No Print  . Order #: [id number] Class: Historical Med  . Order #: [id number] Class: Historical Med  . Order #: [id number] Class: Historical Med  . Order #: [id number] Class: Historical Med  . Order #: [id number] Class: Historical Med  . Order #: [id number] Class: Normal  . Order #: [id number] Class: Historical Med  . Order #: [id number] Class: Historical Med  . Order #: [id number] Class: Historical Med  . Order #: [id number] Class: Historical Med  . Order #: [id number] Class: Historical Med  . Order #: [id number] Class: Historical Med  . Order #: [id number] Class: Historical Med  . Order #: [id number] Class: Historical Med  . Order #: [id number] Class: Historical Med  . Order #: [id number] Class: Normal  . Order #: [id number] Class: Historical Med  . Order #: [id number] Class: Normal  . Order #: [id number] Class: Historical Med  . Order #: [id number] Class: Historical Med  . Order #: [id number] Class: Historical Med  . Order #: [id number] Class: Historical Med    Allergies Amlodipine; Levofloxacin; Nexium [esomeprazole magnesium]; and Aspartame  Family History  Problem Relation Age of Onset   . Breast cancer Mother 3  . Diabetes Mellitus II Mother   . Hypothyroidism Mother   . Atrial fibrillation Mother   . Heart attack Father   . Diabetes Maternal Grandmother   . Heart attack Maternal Grandfather     Social History Social History   Tobacco Use  . Smoking status: Never Smoker  . Smokeless tobacco: Never Used  . Tobacco comment: no passive smokers in home  Substance Use Topics  . Alcohol use: No    Alcohol/week: 0.0 standard drinks  . Drug use: No    Review of Systems Constitutional: No fever/chills. Eyes: No visual changes. ENT: No sore throat. No congestion or rhinorrhea. Cardiovascular: Denies chest pain. Denies palpitations. Respiratory: Denies shortness of breath.  No cough. Gastrointestinal: No abdominal pain.  No nausea, no vomiting.  No diarrhea.  No constipation. Genitourinary: Negative for dysuria. Musculoskeletal: Negative for back pain. Skin: Negative for rash. Neurological: Negative for headaches. No focal numbness, tingling or weakness.     ____________________________________________   PHYSICAL EXAM:  VITAL SIGNS: ED Triage Vitals  Enc Vitals Group     BP 11/07/18 1856 (!) 170/80     Pulse Rate 11/07/18 1856 (!) 59     Resp 11/07/18 1856 18     Temp 11/07/18 1856 98.1 F (36.7 C)     Temp src --  SpO2 11/07/18 1856 100 %     Weight 11/07/18 1852 156 lb (70.8 kg)     Height 11/07/18 1852 _0  (1.626 m)     Head Circumference --      Peak Flow --      Pain Score 11/07/18 1852 8     Pain Loc --      Pain Edu? --      Excl. in [place]? --     Constitutional: Alert and oriented. Answers questions appropriately.  Chronically ill-appearing.  GCS is 15. Eyes: Conjunctivae are normal.  EOMI. No scleral icterus.  No raccoon eyes. Head: Atraumatic.  No battle sign. Nose: No congestion/rhinnorhea.  No swelling over the nose or septal hematoma. Mouth/Throat: Mucous membranes are mildly dry.  No dental injury or malocclusion. Neck: No  stridor.  Supple.  No midline C-spine tenderness to palpation, step-offs or deformities. Cardiovascular: Normal rate, regular rhythm. No murmurs, rubs or gallops.  The patient is satting 100% on 3 L nasal cannula. Respiratory: Normal respiratory effort.  No accessory muscle use or retractions. Lungs CTAB.  No wheezes, rales or ronchi. Gastrointestinal: Overweight.  Soft, nontender and nondistended.  No guarding or rebound.  No peritoneal signs. Musculoskeletal: [place] is stable.  The patient has full range of motion of the right hip, right knee and bilateral ankles without pain.  The patient has external rotation and shortening of the left leg with tenderness to palpation over the greater trochanter.  I am unable to perform range of motion of her knee due to pain.  She has normal DP and PT pulses on the left with normal sensation to light touch in the left lower extremity.  No LE edema. No ttp in the calves or palpable cords.  Negative Homan's sign. Neurologic:  A&Ox3.  Speech is clear.  Face and smile are symmetric.  EOMI.  Moves all extremities well. Skin:  Skin is warm, dry and intact. No rash noted. Psychiatric: Mood and affect are normal. Speech and behavior are normal.  Normal judgement.  ____________________________________________   LABS (all labs ordered are listed, but only abnormal results are displayed)  Labs Reviewed  URINALYSIS, COMPLETE (UACMP) WITH MICROSCOPIC - Abnormal; Notable for the following components:      Result Value   Color, Urine STRAW (*)    APPearance HAZY (*)    Hgb urine dipstick SMALL (*)    Leukocytes, UA TRACE (*)    Bacteria, UA FEW (*)    All other components within normal limits  CBC - Abnormal; Notable for the following components:   WBC 17.3 (*)    RBC 5.34 (*)    All other components within normal limits  BASIC METABOLIC PANEL - Abnormal; Notable for the following components:   Glucose, Bld 238 (*)    GFR calc non Af Amer 58 (*)    All other  components within normal limits   ____________________________________________  EKG  ED ECG REPORT I, Anne-Caroline Mariea Clonts, the attending physician, personally viewed and interpreted this ECG.   Date: 11/07/2018  EKG Time: 2128  Rate: 62  Rhythm: normal sinus rhythm  Axis: normal  Intervals:none  ST&T Change: No STEMI  ____________________________________________  RADIOLOGY  Dg Hip Unilat With Pelvis 2-3 Views Left  Result Date: 11/07/2018 CLINICAL DATA:  Fall, left hip pain EXAM: DG HIP (WITH OR WITHOUT PELVIS) 2-3V LEFT COMPARISON:  None. FINDINGS: There is a left femoral neck/intertrochanteric fracture with mild varus angulation. No subluxation or dislocation. Mild joint space  narrowing and spurring in the hip joints bilaterally, symmetric. SI joints symmetric and unremarkable. IMPRESSION: Left femoral neck/intertrochanteric fracture with varus angulation. Electronically Signed   By: Rolm Baptise M.D.   On: 11/07/2018 20:49    ____________________________________________   PROCEDURES  Procedure(s) performed: None  Procedures  Critical Care performed: No ____________________________________________   INITIAL IMPRESSION / ASSESSMENT AND PLAN / ED COURSE  Pertinent labs & imaging results that were available during my care of the patient were reviewed by me and considered in my medical decision making (see chart for details).  78 y.o. female status post fall, mechanical, without loss of consciousness presenting with left hip pain and examination concerning for left hip fracture.  Overall, I do not see any evidence of a syncopal episode.  I am concerned that she has a hip fracture and the patient states that all of her pulmonary physicians are at [facility] and that if she has a fracture she will request transfer there.  Plan reevaluation for final disposition.  Symptomatic treatment has been ordered.  ----------------------------------------- 9:10 PM on  11/07/2018 -----------------------------------------  Patient reports that she initially felt better but her pain has come back after getting her x-ray.  Unfortunately, she does have a left femoral neck and intercurrent trochanteric fracture.  She is not willing to undergo surgical evaluation at [facility], she is closely followed both for her pulmonary hypertension and pulmonary fibrosis at [facility].  I plan to call [facility] for transfer, but I do know that [facility] is full and has a lengthy wait list.  If that is still the case, we will plan to put her on the wait list and admit her here for continued medication management and pain control.  ----------------------------------------- 9:38 PM on 11/07/2018 -----------------------------------------  The [facility] transfer center has called me back and is trying to figure out whether the patient will be a pulmonary service patient.  In the meantime, have spoken to Dr. Adolph Pollack, the [place] orthopedic trauma fellow on-call, and described the fracture to him so the orthopedic team is aware.  ----------------------------------------- 9:53 PM on 11/07/2018 -----------------------------------------  I have spoken to Dr. Davonna Belling the pulmonologist on-call who is accepted the patient to the wait list at [facility].  At this time, I will plan to admit her to the hospitalist for ongoing pain control until she was able to be transferred to [facility]. ____________________________________________  FINAL CLINICAL IMPRESSION(S) / ED DIAGNOSES  Final diagnoses:  Acute cystitis without hematuria  Left displaced femoral neck fracture ([place])  Closed displaced intertrochanteric fracture of left femur, initial encounter ([place])  Fall, initial encounter         NEW MEDICATIONS STARTED DURING THIS VISIT:  New Prescriptions   No medications on file      Eula Listen, MD 11/07/18 2139    Eula Listen, MD 11/07/18 2153

## 2018-11-07 NOTE — H&P (Signed)
[facility] [place] NAME: Julie Mcmillan    MR#:  [id number]  DATE OF BIRTH:  [birth year]  DATE OF ADMISSION:  11/07/2018  PRIMARY CARE PHYSICIAN: Glendon Axe, MD   REQUESTING/REFERRING PHYSICIAN: Mariea Clonts, MD  CHIEF COMPLAINT:   Chief Complaint  Patient presents with  . Fall    HISTORY OF PRESENT ILLNESS:  Julie Mcmillan  is a 78 y.o. female who presents with chief complaint as above.  Patient presents after mechanical fall at home and subsequent left hip pain.  On evaluation here in the ED she is found to have left hip fracture.  She has significant pulmonary disease, and sees a pulmonologist at [facility].  ED physician contacted [facility] for possible transfer as she has been told in the past that she would need coordination with pulmonary experts prior to any surgery.  [facility] orthopedics and pulmonary team are aware of her and willing to take the patient, but there are currently no beds available.  Hospitalist were called for admission here for pain control until a bed can become available at [facility] and the patient can be transferred.  PAST MEDICAL HISTORY:   Past Medical History:  Diagnosis Date  . A-fib ([place])   . Cancer ([place])    skin  . Chronic diarrhea   . Diverticulitis   . GERD (gastroesophageal reflux disease)    usually takes protonix  . Hip fracture ([place]) 11/2015   hairline fracture on right.  no surgery, just physical therapy  . Hypertension   . IBS (irritable bowel syndrome)   . IBS (irritable bowel syndrome)   . Interstitial lung disease ([place])    does not remember when  . Pulmonary fibrosis ([place])   . Pulmonary hypertension ([place])   . Seasonal affective disorder ([place])   . Shortness of breath dyspnea    with exertion  . Type 2 diabetes mellitus ([place]) 11/04/2014   pt states this has resolved with diet/exercise 11/07/18     PAST SURGICAL HISTORY:   Past Surgical History:  Procedure Laterality Date  . ABDOMINAL HYSTERECTOMY      partial  . APPENDECTOMY    . BREAST BIOPSY Left    core bx- neg  . CATARACT EXTRACTION W/PHACO Right 03/16/2015   Procedure: CATARACT EXTRACTION PHACO AND INTRAOCULAR LENS PLACEMENT (IOC);  Surgeon: Leandrew Koyanagi, MD;  Location: [place];  Service: Ophthalmology;  Laterality: Right;  . CATARACT EXTRACTION W/PHACO Left 06/13/2016   Procedure: CATARACT EXTRACTION PHACO AND INTRAOCULAR LENS PLACEMENT (IOC);  Surgeon: Leandrew Koyanagi, MD;  Location: [place];  Service: Ophthalmology;  Laterality: Left;  pre-diabetic - diet controlled  . COLONOSCOPY    . COLONOSCOPY WITH PROPOFOL N/A 05/25/2016   Procedure: COLONOSCOPY WITH PROPOFOL;  Surgeon: Manya Silvas, MD;  Location: [facility] ENDOSCOPY;  Service: Endoscopy;  Laterality: N/A;  . ESOPHAGOGASTRODUODENOSCOPY (EGD) WITH PROPOFOL N/A 05/25/2016   Procedure: ESOPHAGOGASTRODUODENOSCOPY (EGD) WITH PROPOFOL;  Surgeon: Manya Silvas, MD;  Location: [facility] ENDOSCOPY;  Service: Endoscopy;  Laterality: N/A;  . NISSEN FUNDOPLICATION  [id number]   came apart in 2016 and ibs and reflux returned  . OPEN REDUCTION INTERNAL FIXATION (ORIF) DISTAL RADIAL FRACTURE Right 01/13/2016   Procedure: OPEN REDUCTION INTERNAL FIXATION (ORIF) DISTAL RADIAL FRACTURE;  Surgeon: Hessie Knows, MD;  Location: [facility] ORS;  Service: Orthopedics;  Laterality: Right;  . TONSILLECTOMY     and adenoidectomy     SOCIAL HISTORY:   Social History   Tobacco Use  .  Smoking status: Never Smoker  . Smokeless tobacco: Never Used  . Tobacco comment: no passive smokers in home  Substance Use Topics  . Alcohol use: No    Alcohol/week: 0.0 standard drinks     FAMILY HISTORY:   Family History  Problem Relation Age of Onset  . Breast cancer Mother 35  . Diabetes Mellitus II Mother   . Hypothyroidism Mother   . Atrial fibrillation Mother   . Heart attack Father   . Diabetes Maternal Grandmother   . Heart attack Maternal Grandfather      DRUG  ALLERGIES:   Allergies  Allergen Reactions  . Amlodipine Swelling  . Levofloxacin     Other reaction(s): Other (See Comments) No flouroquinolones while patient on sotalol   . Nexium [Esomeprazole Magnesium] Diarrhea  . Aspartame Other (See Comments)    Patient reports "self diagnosed intolerance" to artificial sweeteners.    MEDICATIONS AT HOME:   Prior to Admission medications   Medication Sig Start Date End Date Taking? Authorizing Provider  acetaminophen (TYLENOL) 325 MG tablet Take 2 tablets (650 mg total) by mouth every 6 (six) hours as needed for mild pain (or Fever >/= 101). 02/12/18   Loletha Grayer, MD  apixaban (ELIQUIS) 5 MG TABS tablet Take 5 mg by mouth 2 (two) times daily.    [provider]  busPIRone (BUSPAR) 7.5 MG tablet Take 7.5 mg by mouth 2 (two) times daily. 10/22/18   [provider]  Calcium Carbonate-Vitamin D (CALCIUM 600+D) 600-200 MG-UNIT TABS Take 1 tablet by mouth daily.    [provider]  cholestyramine (QUESTRAN) 4 g packet Take 4 g by mouth daily before breakfast.  10/05/18   [provider]  diltiazem (CARDIZEM CD) 180 MG 24 hr capsule Take 180 mg by mouth daily. 10/04/18   [provider]  feeding supplement, ENSURE ENLIVE, (ENSURE ENLIVE) LIQD Take 237 mLs 2 (two) times daily between meals by mouth. 08/16/17   Epifanio Lesches, MD  FLUoxetine (PROZAC) 40 MG capsule Take 40 mg by mouth daily. 10/22/18   [provider]  fluticasone (FLONASE) 50 MCG/ACT nasal spray Place 2 sprays into both nostrils daily.  04/02/18   [provider]  furosemide (LASIX) 20 MG tablet Take 1 tablet daily by mouth. 08/06/17 08/06/18  [provider]  hydrocortisone 2.5 % ointment Apply 1 application topically 2 (two) times daily as needed (itching).  10/21/18   [provider]  ibandronate (BONIVA) 150 MG tablet Take 150 mg by mouth every 30 (thirty) days.  05/21/18   [provider]   levothyroxine (SYNTHROID, LEVOTHROID) 50 MCG tablet Take 50 mcg by mouth daily before breakfast.    [provider]  losartan (COZAAR) 100 MG tablet Take 100 mg by mouth daily.     [provider]  meclizine (ANTIVERT) 25 MG tablet Take 25 mg by mouth 3 (three) times daily as needed (vertigo).  03/27/18   [provider]  ondansetron (ZOFRAN) 4 MG tablet TAKE 1 TABLET BY MOUTH EVERY 8 HOURS AS NEEDED FOR NAUSEA 04/02/18   [provider]  pantoprazole (PROTONIX) 40 MG tablet Take 1 tablet (40 mg total) by mouth daily. 03/29/15   Glendon Axe, MD  ranitidine (ZANTAC) 300 MG tablet Take 300 mg by mouth at bedtime.     [provider]  sotalol (BETAPACE) 80 MG tablet Take 1 tablet (80 mg total) by mouth every 12 (twelve) hours. 03/29/15   Glendon Axe, MD  tadalafil, [place], (  ADCIRCA) 20 MG tablet Take 2 tablets daily by mouth. 03/25/17   [provider]  Treprostinil (TYVASO REFILL) 0.6 MG/ML SOLN Inhale into the lungs. 06/13/18   [provider]  triamcinolone cream (KENALOG) 0.1 % Apply 1 application topically 2 (two) times daily.  10/21/18   [provider]  zaleplon (SONATA) 5 MG capsule Take 5 mg by mouth at bedtime as needed for sleep.  10/22/18   [provider]    REVIEW OF SYSTEMS:  Review of Systems  Constitutional: Negative for chills, fever, malaise/fatigue and weight loss.  HENT: Negative for ear pain, hearing loss and tinnitus.   Eyes: Negative for blurred vision, double vision, pain and redness.  Respiratory: Negative for cough, hemoptysis and shortness of breath.   Cardiovascular: Negative for chest pain, palpitations, orthopnea and leg swelling.  Gastrointestinal: Negative for abdominal pain, constipation, diarrhea, nausea and vomiting.  Genitourinary: Negative for dysuria, frequency and hematuria.  Musculoskeletal: Positive for falls and joint pain (Left hip). Negative for back pain and neck pain.  Skin:        No acne, rash, or lesions  Neurological: Negative for dizziness, tremors, focal weakness and weakness.  Endo/Heme/Allergies: Negative for polydipsia. Does not bruise/bleed easily.  Psychiatric/Behavioral: Negative for depression. The patient is not nervous/anxious and does not have insomnia.      VITAL SIGNS:   Vitals:   11/07/18 1852 11/07/18 1856 11/07/18 2014  BP:  (!) 170/80 (!) 152/59  Pulse:  (!) 59 60  Resp:  18 18  Temp:  98.1 F (36.7 C)   SpO2:  100% 98%  Weight: 70.8 kg    Height: _0  (1.626 m)     Wt Readings from Last 3 Encounters:  11/07/18 70.8 kg  11/04/18 71.8 kg  06/09/18 68.8 kg    PHYSICAL EXAMINATION:  Physical Exam  Vitals reviewed. Constitutional: She is oriented to person, place, and time. She appears well-developed and well-nourished. No distress.  HENT:  Head: Normocephalic and atraumatic.  Mouth/Throat: Oropharynx is clear and moist.  Eyes: Pupils are equal, round, and reactive to light. Conjunctivae and EOM are normal. No scleral icterus.  Neck: Normal range of motion. Neck supple. No JVD present. No thyromegaly present.  Cardiovascular: Normal rate, regular rhythm and intact distal pulses. Exam reveals no gallop and no friction rub.  No murmur heard. Respiratory: Effort normal and breath sounds normal. No respiratory distress. She has no wheezes. She has no rales.  GI: Soft. Bowel sounds are normal. She exhibits no distension. There is no abdominal tenderness.  Musculoskeletal: Normal range of motion.        General: Tenderness (Left hip) present. No edema.     Comments: No arthritis, no gout  Lymphadenopathy:    She has no cervical adenopathy.  Neurological: She is alert and oriented to person, place, and time. No cranial nerve deficit.  No dysarthria, no aphasia  Skin: Skin is warm and dry. No rash noted. No erythema.  Psychiatric: She has a normal mood and affect. Her behavior is normal. Judgment and thought content normal.     LABORATORY PANEL:   CBC Recent Labs  Lab 11/07/18 2011  WBC 17.3*  HGB 14.0  HCT 44.6  PLT 314   ------------------------------------------------------------------------------------------------------------------  Chemistries  Recent Labs  Lab 11/07/18 2011  NA 135  K 4.2  CL 101  CO2 24  GLUCOSE 238*  BUN 15  CREATININE 0.94  CALCIUM 8.9   ------------------------------------------------------------------------------------------------------------------  Cardiac Enzymes No results for  input(s): TROPONINI in the last 168 hours. ------------------------------------------------------------------------------------------------------------------  RADIOLOGY:  Dg Hip Unilat With Pelvis 2-3 Views Left  Result Date: 11/07/2018 CLINICAL DATA:  Fall, left hip pain EXAM: DG HIP (WITH OR WITHOUT PELVIS) 2-3V LEFT COMPARISON:  None. FINDINGS: There is a left femoral neck/intertrochanteric fracture with mild varus angulation. No subluxation or dislocation. Mild joint space narrowing and spurring in the hip joints bilaterally, symmetric. SI joints symmetric and unremarkable. IMPRESSION: Left femoral neck/intertrochanteric fracture with varus angulation. Electronically Signed   By: Rolm Baptise M.D.   On: 11/07/2018 20:49    EKG:   Orders placed or performed during the hospital encounter of 11/07/18  . ED EKG  . ED EKG  . EKG 12-Lead  . EKG 12-Lead    IMPRESSION AND PLAN:  Principal Problem:   Closed left hip fracture (HCC) -PRN analgesia.  Patient will need transferred to [facility] for operative repair of her hip fracture and coordination with her pulmonologist.  See HPI for details.  ED physician spoke with pulmonology and orthopedic surgery at [facility], see her note for this information Active Problems:   Interstitial lung disease ([place]) -continue home meds   Type 2 diabetes mellitus ([place]) -sliding scale insulin coverage   Essential (primary) hypertension -continue home meds   PAF  (paroxysmal atrial fibrillation) ([place]) -continue home rate controlling medications   Anxiety -continue home dose anxiolytic   GERD (gastroesophageal reflux disease) -continue home dose PPI   Acquired hypothyroidism -continue home dose thyroid replacement  Chart review performed and case discussed with ED provider. Labs, imaging and/or ECG reviewed by provider and discussed with patient/family. Management plans discussed with the patient and/or family.  DVT PROPHYLAXIS: SubQ lovenox   GI PROPHYLAXIS:  PPI   ADMISSION STATUS: Inpatient     CODE STATUS: Full Code Status History    Date Active Date Inactive Code Status Order ID Comments User Context   02/10/2018 0146 02/12/2018 1908 Full Code [id number]  Amelia Jo, MD Inpatient   08/12/2017 0500 08/16/2017 1730 Full Code [id number]  Saundra Shelling, MD Inpatient   10/27/2016 0039 10/27/2016 1822 Full Code [id number]  Lance Coon, MD Inpatient   08/31/2016 2218 09/03/2016 1808 Full Code [id number]  Hubbard Robinson, MD ED   11/26/2015 0002 11/29/2015 2100 Full Code [id number]  Hillary Bow, MD ED   07/04/2015 1717 07/10/2015 2011 Full Code [id number]  Marlyce Huge, MD ED   03/25/2015 1947 03/29/2015 2044 Full Code [id number]  Vaughan Basta, MD Inpatient    Advance Directive Documentation     Most Recent Value  Type of Advance Directive  Healthcare Power of Attorney, Living will  Pre-existing out of facility DNR order (yellow form or pink MOST form)  -  "MOST" Form in Place?  -      TOTAL TIME TAKING CARE OF THIS PATIENT: 45 minutes.   Ethlyn Daniels 11/07/2018, 10:57 PM  [facility]  Office  [phone]  CC: Primary care physician; Glendon Axe, MD  Note:  This document was prepared using Dragon voice recognition software and may include unintentional dictation errors.

## 2018-11-07 NOTE — ED Notes (Signed)
DG called for pick up

## 2018-11-07 NOTE — ED Notes (Signed)
Admitted to [facility]/ Waitlisted for [facility]

## 2018-11-07 NOTE — ED Notes (Signed)
Patient is resting comfortably. 

## 2018-11-07 NOTE — ED Notes (Signed)
Report finished att, this RN transporting

## 2018-11-08 LAB — CBC
HCT: 37.4 % (ref 36.0–46.0)
Hemoglobin: 11.6 g/dL — ABNORMAL LOW (ref 12.0–15.0)
MCH: 26.1 pg (ref 26.0–34.0)
MCHC: 31 g/dL (ref 30.0–36.0)
MCV: 84 fL (ref 80.0–100.0)
Platelets: 256 10*3/uL (ref 150–400)
RBC: 4.45 MIL/uL (ref 3.87–5.11)
RDW: 13.5 % (ref 11.5–15.5)
WBC: 17.3 10*3/uL — ABNORMAL HIGH (ref 4.0–10.5)
nRBC: 0 % (ref 0.0–0.2)

## 2018-11-08 LAB — BASIC METABOLIC PANEL
Anion gap: 8 (ref 5–15)
BUN: 14 mg/dL (ref 8–23)
CO2: 26 mmol/L (ref 22–32)
Calcium: 8.5 mg/dL — ABNORMAL LOW (ref 8.9–10.3)
Chloride: 103 mmol/L (ref 98–111)
Creatinine, Ser: 0.8 mg/dL (ref 0.44–1.00)
GFR calc Af Amer: >60 mL/min (ref >60)
GFR calc non Af Amer: >60 mL/min (ref >60)
Glucose, Bld: 130 mg/dL — ABNORMAL HIGH (ref 70–99)
POTASSIUM: 4.2 mmol/L (ref 3.5–5.1)
Sodium: 137 mmol/L (ref 135–145)

## 2018-11-08 LAB — GLUCOSE, CAPILLARY
Glucose-Capillary: 108 mg/dL — ABNORMAL HIGH (ref 70–99)
Glucose-Capillary: 144 mg/dL — ABNORMAL HIGH (ref 70–99)
Glucose-Capillary: 148 mg/dL — ABNORMAL HIGH (ref 70–99)

## 2018-11-08 MED ORDER — ADULT MULTIVITAMIN W/MINERALS CH: 1.0000 | ORAL_TABLET | Freq: Every day | ORAL | Status: DC

## 2018-11-08 MED ORDER — PANTOPRAZOLE SODIUM 40 MG PO TBEC: 40.0000 mg | DELAYED_RELEASE_TABLET | Freq: Every day | ORAL | Status: DC

## 2018-11-08 MED ORDER — PREDNISONE 10 MG PO TABS: 10.0000 mg | ORAL_TABLET | Freq: Every day | ORAL | Status: DC

## 2018-11-08 MED ORDER — VITAMIN C 500 MG PO TABS: 500.0000 mg | ORAL_TABLET | Freq: Every day | ORAL | Status: DC

## 2018-11-08 MED ORDER — TADALAFIL (PAH) 20 MG PO TABS
40.0000 mg | ORAL_TABLET | Freq: Every day | ORAL | Status: DC
  Filled 2018-11-08: qty 2

## 2018-11-08 MED ORDER — DILTIAZEM HCL ER COATED BEADS 180 MG PO CP24
180.0000 mg | ORAL_CAPSULE | Freq: Every day | ORAL | Status: DC
  Filled 2018-11-08: qty 1

## 2018-11-08 MED ORDER — CHOLESTYRAMINE 4 G PO PACK
4.0000 g | PACK | Freq: Every day | ORAL | Status: DC
  Filled 2018-11-08: qty 1

## 2018-11-08 MED ORDER — SOTALOL HCL 80 MG PO TABS
80.0000 mg | ORAL_TABLET | Freq: Two times a day (BID) | ORAL | Status: DC
  Filled 2018-11-08 (×2): qty 1

## 2018-11-08 MED ORDER — FLUOXETINE HCL 20 MG PO CAPS
40.0000 mg | ORAL_CAPSULE | Freq: Every day | ORAL | Status: DC
  Filled 2018-11-08: qty 2

## 2018-11-08 MED ORDER — LEVOTHYROXINE SODIUM 50 MCG PO TABS: 75.0000 ug | ORAL_TABLET | Freq: Every day | ORAL | Status: DC

## 2018-11-08 MED ORDER — BUSPIRONE HCL 15 MG PO TABS
7.5000 mg | ORAL_TABLET | Freq: Two times a day (BID) | ORAL | Status: DC
  Filled 2018-11-08 (×2): qty 1

## 2018-11-08 NOTE — Discharge Summary (Signed)
[place] at [facility], 78 y.o., DOB 1941-08-18, MRN [id number]. Admission date: 11/07/2018 Discharge Date 11/08/2018 Primary MD Glendon Axe, MD Admitting Physician Lance Coon, MD  Admission Diagnosis  Acute cystitis without hematuria [N30.00] Left displaced femoral neck fracture ([place]) [S72.002A] Closed displaced intertrochanteric fracture of left femur, initial encounter [facility]) [S72.142A] Fall, initial encounter [W19.XXXA]  Discharge Diagnosis   Principal Problem:   Closed left hip fracture (HCC)   Type 2 diabetes mellitus (HCC)   GERD (gastroesophageal reflux disease)   Essential (primary) hypertension   PAF (paroxysmal atrial fibrillation) ([place])   Anxiety   Acquired hypothyroidism   Interstitial lung disease ([place]) Chronic respiratory failure        Hospital Course  Julie Mcmillan  is a 78 y.o. female who presents with chief complaint as above.  Patient presents after mechanical fall at home and subsequent left hip pain.  On evaluation here in the ED she is found to have left hip fracture.  Patient receives her medical care for her pulmonary fibrosis and pulmonary hypertension at [facility]  Patient's family and patient requested transfer to [facility].  ED physician contacted the pulmonologist at [facility] who accepted the patient in transfer.  However they were on diversion therefore patient was admitted to the our hospital.  I called [facility] today they state that patient is on a expedited list for transfer.            Consults  None  Significant Tests:  See full reports for all details    Dg Hip Unilat With Pelvis 2-3 Views Left  Result Date: 11/07/2018 CLINICAL DATA:  Fall, left hip pain EXAM: DG HIP (WITH OR WITHOUT PELVIS) 2-3V LEFT COMPARISON:  None. FINDINGS: There is a left femoral neck/intertrochanteric fracture with mild varus angulation. No subluxation or dislocation. Mild joint space narrowing and spurring in  the hip joints bilaterally, symmetric. SI joints symmetric and unremarkable. IMPRESSION: Left femoral neck/intertrochanteric fracture with varus angulation. Electronically Signed   By: Rolm Baptise M.D.   On: 11/07/2018 20:49       Today   Subjective:   Julie Mcmillan patient has some hip pain  Objective:   Blood pressure (!) 163/63, pulse (!) 54, temperature 98.2 F (36.8 C), temperature source Oral, resp. rate 20, height _0  (1.626 m), weight 64.9 kg, SpO2 99 %.  .  Intake/Output Summary (Last 24 hours) at 11/08/2018 1339 Last data filed at 11/08/2018 1035 Gross per 24 hour  Intake 1339.99 ml  Output 100 ml  Net 1239.99 ml    Exam VITAL SIGNS: Blood pressure (!) 163/63, pulse (!) 54, temperature 98.2 F (36.8 C), temperature source Oral, resp. rate 20, height _1  (1.626 m), weight 64.9 kg, SpO2 99 %.  GENERAL:  78 y.o.-year-old patient lying in the bed with no acute distress.  EYES: Pupils equal, round, reactive to light and accommodation. No scleral icterus. Extraocular muscles intact.  HEENT: Head atraumatic, normocephalic. Oropharynx and nasopharynx clear.  NECK:  Supple, no jugular venous distention. No thyroid enlargement, no tenderness.  LUNGS: Normal breath sounds bilaterally, no wheezing, rales,rhonchi or crepitation. No use of accessory muscles of respiration.  CARDIOVASCULAR: S1, S2 normal. No murmurs, rubs, or gallops.  ABDOMEN: Soft, nontender, nondistended. Bowel sounds present. No organomegaly or mass.  EXTREMITIES: No pedal edema, cyanosis, or clubbing.  NEUROLOGIC: Cranial nerves II through XII are intact. Muscle strength 5/5 in all extremities. Sensation intact. Gait not checked.  PSYCHIATRIC: The  patient is alert and oriented x 3.  SKIN: No obvious rash, lesion, or ulcer.   Data Review     CBC w Diff:  Lab Results  Component Value Date   WBC 17.3 (H) 11/08/2018   HGB 11.6 (L) 11/08/2018   HCT 37.4 11/08/2018   PLT 256 11/08/2018   LYMPHOPCT 8  02/09/2018   BANDSPCT 0 02/09/2018   MONOPCT 6 02/09/2018   EOSPCT 1 02/09/2018   BASOPCT 0 02/09/2018   CMP:  Lab Results  Component Value Date   NA 137 11/08/2018   K 4.2 11/08/2018   CL 103 11/08/2018   CO2 26 11/08/2018   BUN 14 11/08/2018   CREATININE 0.80 11/08/2018   PROT 6.1 (L) 02/09/2018   ALBUMIN 3.7 02/09/2018   BILITOT 0.7 02/09/2018   ALKPHOS 55 02/09/2018   AST 22 02/09/2018   ALT 10 (L) 02/09/2018  .  Micro Results Recent Results (from the past 240 hour(s))  Microscopic Examination     Status: None   Collection Time: 11/04/18  3:31 PM  Result Value Ref Range Status   WBC, UA 0-5 0 - 5 /hpf Final   RBC, UA None seen 0 - 2 /hpf Final   Epithelial Cells (non renal) None seen 0 - 10 /hpf Final   Bacteria, UA None seen None seen/Few Final        Code Status Orders  (From admission, onward)         Start     Ordered   11/07/18 2323  Full code  Continuous     11/07/18 2322        Code Status History    Date Active Date Inactive Code Status Order ID Comments User Context   02/10/2018 0146 02/12/2018 1908 Full Code [id number]  Amelia Jo, MD Inpatient   08/12/2017 0500 08/16/2017 1730 Full Code [id number]  Saundra Shelling, MD Inpatient   10/27/2016 0039 10/27/2016 1822 Full Code [id number]  Lance Coon, MD Inpatient   08/31/2016 2218 09/03/2016 1808 Full Code [id number]  Hubbard Robinson, MD ED   11/26/2015 0002 11/29/2015 2100 Full Code [id number]  Hillary Bow, MD ED   07/04/2015 1717 07/10/2015 2011 Full Code [id number]  Marlyce Huge, MD ED   03/25/2015 1947 03/29/2015 2044 Full Code [id number]  Vaughan Basta, MD Inpatient    Advance Directive Documentation     Most Recent Value  Type of Advance Directive  Healthcare Power of Attorney, Living will  Pre-existing out of facility DNR order (yellow form or pink MOST form)  -  "MOST" Form in Place?  -            Discharge Medications   Allergies as of 11/08/2018      Reactions    Amlodipine Swelling   Levofloxacin Other (See Comments)   Other reaction(s): Other (See Comments) No flouroquinolones while patient on sotalol    Nexium [esomeprazole Magnesium] Diarrhea   Aspartame Other (See Comments)   Patient reports "self diagnosed intolerance" to artificial sweeteners.      Medication List    STOP taking these medications   ELIQUIS 5 MG Tabs tablet Generic drug:  apixaban     TAKE these medications   acetaminophen 500 MG tablet Commonly known as:  TYLENOL Take 1,000 mg by mouth at bedtime.   ADCIRCA 20 MG tablet Generic drug:  tadalafil (PAH) Take 40 mg by mouth at bedtime.   busPIRone 7.5 MG tablet Commonly known as:  BUSPAR Take 7.5 mg by mouth  2 (two) times daily.   CALCIUM 600+D 600-200 MG-UNIT Tabs Generic drug:  Calcium Carbonate-Vitamin D Take 1 tablet by mouth daily.   cholestyramine 4 g packet Commonly known as:  QUESTRAN Take 4 g by mouth daily before breakfast.   diltiazem 180 MG 24 hr capsule Commonly known as:  CARDIZEM CD Take 180 mg by mouth daily.   diltiazem 30 MG tablet Commonly known as:  CARDIZEM Take 30 mg by mouth as needed (palpitations for more than 30 minutes).   FLUoxetine 40 MG capsule Commonly known as:  PROZAC Take 40 mg by mouth daily.   furosemide 20 MG tablet Commonly known as:  LASIX Take 20 mg by mouth daily.   ibandronate 150 MG tablet Commonly known as:  BONIVA Take 150 mg by mouth every 30 (thirty) days.   levothyroxine 75 MCG tablet Commonly known as:  SYNTHROID, LEVOTHROID Take 75 mcg by mouth daily before breakfast.   meclizine 25 MG tablet Commonly known as:  ANTIVERT Take 25 mg by mouth 3 (three) times daily as needed (vertigo).   multivitamin with minerals Tabs tablet Take 1 tablet by mouth daily.   ondansetron 4 MG tablet Commonly known as:  ZOFRAN Take 4 mg by mouth every 8 (eight) hours as needed for nausea.   pantoprazole 40 MG tablet Commonly known as:  PROTONIX Take 1  tablet (40 mg total) by mouth daily.   predniSONE 10 MG tablet Commonly known as:  DELTASONE Take 10 mg by mouth daily with breakfast.   sotalol 80 MG tablet Commonly known as:  BETAPACE Take 1 tablet (80 mg total) by mouth every 12 (twelve) hours.   vitamin C 500 MG tablet Commonly known as:  ASCORBIC ACID Take 500 mg by mouth daily.   zaleplon 5 MG capsule Commonly known as:  SONATA Take 5 mg by mouth at bedtime.          Total Time in preparing paper work, data evaluation and todays exam - 33 minutes  Dustin Flock M.D on 11/08/2018 at [place]  [phone]

## 2018-11-08 NOTE — Plan of Care (Signed)
  Problem: Education: Goal: Knowledge of General Education information will improve Description Including pain rating scale, medication(s)/side effects and non-pharmacologic comfort measures Outcome: Progressing   Problem: Health Behavior/Discharge Planning: Goal: Ability to manage health-related needs will improve Outcome: Progressing   Problem: Clinical Measurements: Goal: Ability to maintain clinical measurements within normal limits will improve Outcome: Progressing Goal: Will remain free from infection Outcome: Progressing Goal: Diagnostic test results will improve Outcome: Progressing Goal: Respiratory complications will improve Outcome: Progressing Goal: Cardiovascular complication will be avoided Outcome: Progressing   Problem: Activity: Goal: Risk for activity intolerance will decrease Outcome: Progressing   Problem: Nutrition: Goal: Adequate nutrition will be maintained Outcome: Progressing   Problem: Coping: Goal: Level of anxiety will decrease Outcome: Progressing   Problem: Elimination: Goal: Will not experience complications related to bowel motility Outcome: Progressing Goal: Will not experience complications related to urinary retention Outcome: Progressing   Problem: Pain Managment: Goal: General experience of comfort will improve Outcome: Progressing   Problem: Safety: Goal: Ability to remain free from injury will improve Outcome: Progressing   Problem: Skin Integrity: Goal: Risk for impaired skin integrity will decrease Outcome: Progressing

## 2018-11-08 NOTE — Progress Notes (Signed)
[place] at [facility]                                                                                                                                                                                  Patient Demographics   Julie Mcmillan, is a 78 y.o. female, DOB - [birth year], XEN:[id number]  Admit date - 11/07/2018   Admitting Physician Lance Coon, MD  Outpatient Primary MD for the patient is Glendon Axe, MD   LOS - 1  Subjective: Patient admitted with hip pain continues to have hip pain.  Otherwise denying any complaints    Review of Systems:   CONSTITUTIONAL: No documented fever. No fatigue, weakness. No weight gain, no weight loss.  EYES: No blurry or double vision.  ENT: No tinnitus. No postnasal drip. No redness of the oropharynx.  RESPIRATORY: No cough, no wheeze, no hemoptysis. No dyspnea.  CARDIOVASCULAR: No chest pain. No orthopnea. No palpitations. No syncope.  GASTROINTESTINAL: No nausea, no vomiting or diarrhea. No abdominal pain. No melena or hematochezia.  GENITOURINARY: No dysuria or hematuria.  ENDOCRINE: No polyuria or nocturia. No heat or cold intolerance.  HEMATOLOGY: No anemia. No bruising. No bleeding.  INTEGUMENTARY: No rashes. No lesions.  MUSCULOSKELETAL: Left-sided hip pain  nEUROLOGIC: No numbness, tingling, or ataxia. No seizure-type activity.  PSYCHIATRIC: No anxiety. No insomnia. No ADD.    Vitals:   Vitals:   11/07/18 2300 11/08/18 0006 11/08/18 0009 11/08/18 0742  BP: (!) 164/70 (!) 184/75  (!) 163/63  Pulse: (!) 58 66  (!) 54  Resp: _0 Temp:  98.7 F (37.1 C)  98.2 F (36.8 C)  TempSrc:  Oral  Oral  SpO2: 99% 98%  99%  Weight:  64.9 kg    Height:   _1  (1.626 m)     Wt Readings from Last 3 Encounters:  11/08/18 64.9 kg  11/04/18 71.8 kg  06/09/18 68.8 kg     Intake/Output Summary (Last 24 hours) at 11/08/2018 1335 Last data filed at 11/08/2018 1035 Gross per 24 hour  Intake 1339.99 ml   Output 100 ml  Net 1239.99 ml    Physical Exam:   GENERAL: Pleasant-appearing in no apparent distress.  HEAD, EYES, EARS, NOSE AND THROAT: Atraumatic, normocephalic. Extraocular muscles are intact. Pupils equal and reactive to light. Sclerae anicteric. No conjunctival injection. No oro-pharyngeal erythema.  NECK: Supple. There is no jugular venous distention. No bruits, no lymphadenopathy, no thyromegaly.  HEART: Regular rate and rhythm,. No murmurs, no rubs, no clicks.  LUNGS: Clear to auscultation bilaterally. No rales or rhonchi. No wheezes.  ABDOMEN: Soft, flat, nontender, nondistended. Has good bowel sounds.  No hepatosplenomegaly appreciated.  EXTREMITIES: No evidence of any cyanosis, clubbing, or peripheral edema.  +2 pedal and radial pulses bilaterally.  NEUROLOGIC: The patient is alert, awake, and oriented x3 with no focal motor or sensory deficits appreciated bilaterally.  SKIN: Moist and warm with no rashes appreciated.  Psych: Not anxious, depressed LN: No inguinal LN enlargement    Antibiotics   Anti-infectives (From admission, onward)   Start     Dose/Rate Route Frequency Ordered Stop   11/07/18 2045  cefTRIAXone (ROCEPHIN) 1 g in sodium chloride 0.9 % 100 mL IVPB     1 g 200 mL/hr over 30 Minutes Intravenous  Once 11/07/18 2032 11/07/18 2144      Medications   Scheduled Meds: . busPIRone  7.5 mg Oral BID  . cholestyramine  4 g Oral Daily  . diltiazem  180 mg Oral Daily  . enoxaparin (LOVENOX) injection  40 mg Subcutaneous Q24H  . FLUoxetine  40 mg Oral Daily  . levothyroxine  75 mcg Oral QAC breakfast  . multivitamin with minerals  1 tablet Oral Daily  . pantoprazole  40 mg Oral Daily  . predniSONE  10 mg Oral Q breakfast  . sotalol  80 mg Oral Q12H  . tadalafil (PAH)  40 mg Oral QHS  . vitamin C  500 mg Oral Daily   Continuous Infusions: PRN Meds:.acetaminophen **OR** acetaminophen, HYDROmorphone (DILAUDID) injection, ondansetron **OR** ondansetron  (ZOFRAN) IV, oxyCODONE   Data Review:   Micro Results Recent Results (from the past 240 hour(s))  Microscopic Examination     Status: None   Collection Time: 11/04/18  3:31 PM  Result Value Ref Range Status   WBC, UA 0-5 0 - 5 /hpf Final   RBC, UA None seen 0 - 2 /hpf Final   Epithelial Cells (non renal) None seen 0 - 10 /hpf Final   Bacteria, UA None seen None seen/Few Final    Radiology Reports Dg Hip Unilat With Pelvis 2-3 Views Left  Result Date: 11/07/2018 CLINICAL DATA:  Fall, left hip pain EXAM: DG HIP (WITH OR WITHOUT PELVIS) 2-3V LEFT COMPARISON:  None. FINDINGS: There is a left femoral neck/intertrochanteric fracture with mild varus angulation. No subluxation or dislocation. Mild joint space narrowing and spurring in the hip joints bilaterally, symmetric. SI joints symmetric and unremarkable. IMPRESSION: Left femoral neck/intertrochanteric fracture with varus angulation. Electronically Signed   By: Rolm Baptise M.D.   On: 11/07/2018 20:49     CBC Recent Labs  Lab 11/07/18 2011 11/08/18 0457  WBC 17.3* 17.3*  HGB 14.0 11.6*  HCT 44.6 37.4  PLT 314 256  MCV 83.5 84.0  MCH 26.2 26.1  MCHC 31.4 31.0  RDW 13.6 13.5    Chemistries  Recent Labs  Lab 11/07/18 2011 11/08/18 0457  NA 135 137  K 4.2 4.2  CL 101 103  CO2 24 26  GLUCOSE 238* 130*  BUN 15 14  CREATININE 0.94 0.80  CALCIUM 8.9 8.5*   ------------------------------------------------------------------------------------------------------------------ estimated creatinine clearance is 50 mL/min (by C-G formula based on SCr of 0.8 mg/dL). ------------------------------------------------------------------------------------------------------------------ No results for input(s): HGBA1C in the last 72 hours. ------------------------------------------------------------------------------------------------------------------ No results for input(s): CHOL, HDL, LDLCALC, TRIG, CHOLHDL, LDLDIRECT in the last 72  hours. ------------------------------------------------------------------------------------------------------------------ No results for input(s): TSH, T4TOTAL, T3FREE, THYROIDAB in the last 72 hours.  Invalid input(s): FREET3 ------------------------------------------------------------------------------------------------------------------ No results for input(s): VITAMINB12, FOLATE, FERRITIN, TIBC, IRON, RETICCTPCT in the last 72 hours.  Coagulation profile No results for input(s): INR, PROTIME in the  last 168 hours.  No results for input(s): DDIMER in the last 72 hours.  Cardiac Enzymes No results for input(s): CKMB, TROPONINI, MYOGLOBIN in the last 168 hours.  Invalid input(s): CK ------------------------------------------------------------------------------------------------------------------ Invalid input(s): [place]  Patient 78 year old with history of interstitial lung disease chronic oxygen being admitted with left-sided hip fracture    1.  Closed left hip fracture (HCC) -PRN analgesia.    Awaiting transfer to [facility] 2. Interstitial lung disease ([place]) -continue home meds 3.  Type 2 diabetes mellitus (HCC) -sliding scale insulin coverage 4.  Essential (primary) hypertension -continue home meds 5.   PAF (paroxysmal atrial fibrillation) ([place]) -continue home rate controlling medications, patient took her own Eliquis from home earlier today 6. Anxiety -continue home dose anxiolytic 7.  GERD (gastroesophageal reflux disease) -continue home dose PPI 8.  Acquired hypothyroidism -continue home dose thyroid replacement     Code Status Orders  (From admission, onward)         Start     Ordered   11/07/18 2323  Full code  Continuous     11/07/18 2322        Code Status History    Date Active Date Inactive Code Status Order ID Comments User Context   02/10/2018 0146 02/12/2018 1908 Full Code [id number]  Amelia Jo, MD Inpatient   08/12/2017  0500 08/16/2017 1730 Full Code [id number]  Saundra Shelling, MD Inpatient   10/27/2016 0039 10/27/2016 1822 Full Code [id number]  Lance Coon, MD Inpatient   08/31/2016 2218 09/03/2016 1808 Full Code [id number]  Hubbard Robinson, MD ED   11/26/2015 0002 11/29/2015 2100 Full Code [id number]  Hillary Bow, MD ED   07/04/2015 1717 07/10/2015 2011 Full Code [id number]  Marlyce Huge, MD ED   03/25/2015 1947 03/29/2015 2044 Full Code [id number]  Vaughan Basta, MD Inpatient    Advance Directive Documentation     Most Recent Value  Type of Advance Directive  Healthcare Power of Attorney, Living will  Pre-existing out of facility DNR order (yellow form or pink MOST form)  -  "MOST" Form in Place?  -           Consults none  DVT Prophylaxis SCDs  Lab Results  Component Value Date   PLT 256 11/08/2018     Time Spent in minutes   35 minutes greater than 50% of time spent in care coordination and counseling patient regarding the condition and plan of care.   Dustin Flock M.D on 11/08/2018 at 1:35 PM  Between 7am to 6pm - Pager - [phone]  After 6pm go to [email or web address] - Proofreader  [employer]   Office  [phone]

## 2018-11-08 NOTE — Progress Notes (Signed)
Patient's son brought patient's home meds, patient took them including eliquis. Dr Posey Pronto made aware.

## 2019-05-04 ENCOUNTER — Other Ambulatory Visit: Payer: Self-pay

## 2019-05-04 ENCOUNTER — Emergency Department
Admission: EM | Admit: 2019-05-04 | Discharge: 2019-05-04 | Discharge status: Against Medical Advice | Payer: Medicare HMO | Attending: Emergency Medicine | Admitting: Emergency Medicine

## 2019-05-04 ENCOUNTER — Encounter: Payer: Self-pay | Admitting: *Deleted

## 2019-05-04 DIAGNOSIS — R531 Weakness: Secondary | ICD-10-CM | POA: Diagnosis present

## 2019-05-04 DIAGNOSIS — Z5321 Procedure and treatment not carried out due to patient leaving prior to being seen by health care provider: Secondary | ICD-10-CM | POA: Insufficient documentation

## 2019-05-04 LAB — CBC
HCT: 38.6 % (ref 36.0–46.0)
Hemoglobin: 12.1 g/dL (ref 12.0–15.0)
MCH: 24.6 pg — ABNORMAL LOW (ref 26.0–34.0)
MCHC: 31.3 g/dL (ref 30.0–36.0)
MCV: 78.6 fL — ABNORMAL LOW (ref 80.0–100.0)
Platelets: 309 10*3/uL (ref 150–400)
RBC: 4.91 MIL/uL (ref 3.87–5.11)
RDW: 14.7 % (ref 11.5–15.5)
WBC: 11.8 10*3/uL — ABNORMAL HIGH (ref 4.0–10.5)
nRBC: 0 % (ref 0.0–0.2)

## 2019-05-04 LAB — BASIC METABOLIC PANEL
Anion gap: 9 (ref 5–15)
BUN: 23 mg/dL (ref 8–23)
CO2: 22 mmol/L (ref 22–32)
Calcium: 8.4 mg/dL — ABNORMAL LOW (ref 8.9–10.3)
Chloride: 106 mmol/L (ref 98–111)
Creatinine, Ser: 0.81 mg/dL (ref 0.44–1.00)
GFR calc Af Amer: >60 mL/min (ref >60)
GFR calc non Af Amer: >60 mL/min (ref >60)
Glucose, Bld: 97 mg/dL (ref 70–99)
Potassium: 4 mmol/L (ref 3.5–5.1)
Sodium: 137 mmol/L (ref 135–145)

## 2019-05-04 LAB — URINALYSIS, COMPLETE (UACMP) WITH MICROSCOPIC
Bilirubin Urine: NEGATIVE
Glucose, UA: NEGATIVE mg/dL
Ketones, ur: NEGATIVE mg/dL
Nitrite: NEGATIVE
Protein, ur: NEGATIVE mg/dL
Specific Gravity, Urine: 1.025 (ref 1.005–1.030)
WBC, UA: >50 WBC/hpf — ABNORMAL HIGH (ref 0–5)
pH: 5 (ref 5.0–8.0)

## 2019-05-04 NOTE — ED Triage Notes (Signed)
Pt to ED with recurrent UTIs that she takes PO Keflex for. Pt reporting she has felt weaker for the past three week but is now feeling she is having air bubbles when she urinates and was told by her PCP that could indicate a UTI. Pt has a hx of needing admission for IV antibiotics. NO fevers or abd pain.

## 2019-08-10 ENCOUNTER — Emergency Department: Payer: Medicare HMO

## 2019-08-10 ENCOUNTER — Emergency Department
Admission: EM | Admit: 2019-08-10 | Discharge: 2019-08-11 | Disposition: A | Discharge status: Home / Self Care | Payer: Medicare HMO | Attending: Emergency Medicine | Admitting: Emergency Medicine

## 2019-08-10 ENCOUNTER — Other Ambulatory Visit: Payer: Self-pay

## 2019-08-10 DIAGNOSIS — E119 Type 2 diabetes mellitus without complications: Secondary | ICD-10-CM | POA: Insufficient documentation

## 2019-08-10 DIAGNOSIS — E039 Hypothyroidism, unspecified: Secondary | ICD-10-CM | POA: Insufficient documentation

## 2019-08-10 DIAGNOSIS — J8410 Pulmonary fibrosis, unspecified: Secondary | ICD-10-CM | POA: Diagnosis not present

## 2019-08-10 DIAGNOSIS — Z79899 Other long term (current) drug therapy: Secondary | ICD-10-CM | POA: Insufficient documentation

## 2019-08-10 DIAGNOSIS — R0602 Shortness of breath: Secondary | ICD-10-CM | POA: Diagnosis present

## 2019-08-10 DIAGNOSIS — I4891 Unspecified atrial fibrillation: Secondary | ICD-10-CM | POA: Diagnosis not present

## 2019-08-10 DIAGNOSIS — I10 Essential (primary) hypertension: Secondary | ICD-10-CM | POA: Insufficient documentation

## 2019-08-10 DIAGNOSIS — Z20828 Contact with and (suspected) exposure to other viral communicable diseases: Secondary | ICD-10-CM | POA: Insufficient documentation

## 2019-08-10 LAB — COMPREHENSIVE METABOLIC PANEL
ALT: 10 U/L (ref 0–44)
AST: 18 U/L (ref 15–41)
Albumin: 3.5 g/dL (ref 3.5–5.0)
Alkaline Phosphatase: 61 U/L (ref 38–126)
Anion gap: 13 (ref 5–15)
BUN: 20 mg/dL (ref 8–23)
CO2: 20 mmol/L — ABNORMAL LOW (ref 22–32)
Calcium: 8.4 mg/dL — ABNORMAL LOW (ref 8.9–10.3)
Chloride: 102 mmol/L (ref 98–111)
Creatinine, Ser: 0.91 mg/dL (ref 0.44–1.00)
GFR calc Af Amer: >60 mL/min (ref >60)
GFR calc non Af Amer: >60 mL/min (ref >60)
Glucose, Bld: 153 mg/dL — ABNORMAL HIGH (ref 70–99)
Potassium: 3.4 mmol/L — ABNORMAL LOW (ref 3.5–5.1)
Sodium: 135 mmol/L (ref 135–145)
Total Bilirubin: 0.5 mg/dL (ref 0.3–1.2)
Total Protein: 6.1 g/dL — ABNORMAL LOW (ref 6.5–8.1)

## 2019-08-10 LAB — CBC WITH DIFFERENTIAL/PLATELET
Abs Immature Granulocytes: 0.1 10*3/uL — ABNORMAL HIGH (ref 0.00–0.07)
Basophils Absolute: 0 10*3/uL (ref 0.0–0.1)
Basophils Relative: 0 %
Eosinophils Absolute: 0.3 10*3/uL (ref 0.0–0.5)
Eosinophils Relative: 2 %
HCT: 35.6 % — ABNORMAL LOW (ref 36.0–46.0)
Hemoglobin: 11.1 g/dL — ABNORMAL LOW (ref 12.0–15.0)
Immature Granulocytes: 1 %
Lymphocytes Relative: 15 %
Lymphs Abs: 1.8 10*3/uL (ref 0.7–4.0)
MCH: 24.3 pg — ABNORMAL LOW (ref 26.0–34.0)
MCHC: 31.2 g/dL (ref 30.0–36.0)
MCV: 78.1 fL — ABNORMAL LOW (ref 80.0–100.0)
Monocytes Absolute: 2.3 10*3/uL — ABNORMAL HIGH (ref 0.1–1.0)
Monocytes Relative: 21 %
Neutro Abs: 6.9 10*3/uL (ref 1.7–7.7)
Neutrophils Relative %: 61 %
Platelets: 261 10*3/uL (ref 150–400)
RBC: 4.56 MIL/uL (ref 3.87–5.11)
RDW: 14.9 % (ref 11.5–15.5)
WBC: 11.6 10*3/uL — ABNORMAL HIGH (ref 4.0–10.5)
nRBC: 0 % (ref 0.0–0.2)

## 2019-08-10 NOTE — ED Triage Notes (Signed)
Pt to the er for difficulty breathing. Pt has been sick for a month with no improvement. Pt now has loss of taste. Pt sleeps on 2L of O2 and functions ion 3L but has had to increase her O2. Pt states her sats drop into the 80s with ambulation. Pt is also tired all the time.

## 2019-08-11 ENCOUNTER — Encounter: Payer: Self-pay | Admitting: Radiology

## 2019-08-11 ENCOUNTER — Emergency Department: Payer: Medicare HMO

## 2019-08-11 ENCOUNTER — Other Ambulatory Visit: Payer: Self-pay

## 2019-08-11 LAB — SARS CORONAVIRUS 2 (TAT 6-24 HRS): SARS Coronavirus 2: NEGATIVE

## 2019-08-11 LAB — TROPONIN I (HIGH SENSITIVITY): Troponin I (High Sensitivity): 8 ng/L (ref <18)

## 2019-08-11 MED ORDER — SODIUM CHLORIDE 0.9 % IV BOLUS
1000.0000 mL | Freq: Once | INTRAVENOUS | Status: AC
  Administered 2019-08-11: 1000 mL via INTRAVENOUS

## 2019-08-11 MED ORDER — DILTIAZEM HCL ER COATED BEADS 180 MG PO CP24
180.0000 mg | ORAL_CAPSULE | Freq: Once | ORAL | Status: AC
  Administered 2019-08-11: 180 mg via ORAL
  Filled 2019-08-11: qty 1

## 2019-08-11 MED ORDER — DILTIAZEM HCL 100 MG IV SOLR: 5.0000 mg/h | INTRAVENOUS | Status: DC

## 2019-08-11 MED ORDER — IOHEXOL 350 MG/ML SOLN
75.0000 mL | Freq: Once | INTRAVENOUS | Status: AC | PRN
  Administered 2019-08-11: 75 mL via INTRAVENOUS

## 2019-08-11 MED ORDER — DILTIAZEM HCL 25 MG/5ML IV SOLN
15.0000 mg | Freq: Once | INTRAVENOUS | Status: AC
  Administered 2019-08-11: 15 mg via INTRAVENOUS

## 2019-08-11 MED ORDER — DILTIAZEM HCL 25 MG/5ML IV SOLN
10.0000 mg | Freq: Once | INTRAVENOUS | Status: AC
  Administered 2019-08-11: 10 mg via INTRAVENOUS

## 2019-08-11 MED ORDER — MAGNESIUM SULFATE 2 GM/50ML IV SOLN
2.0000 g | Freq: Once | INTRAVENOUS | Status: AC
  Administered 2019-08-11: 2 g via INTRAVENOUS
  Filled 2019-08-11: qty 50

## 2019-08-11 MED ORDER — SOTALOL HCL 80 MG PO TABS
80.0000 mg | ORAL_TABLET | Freq: Once | ORAL | Status: AC
  Administered 2019-08-11: 80 mg via ORAL
  Filled 2019-08-11: qty 1

## 2019-08-11 NOTE — ED Notes (Signed)
Verbal order to capture EKG competed. Pt states she is on eliquis as she sometimes goes into Afib.

## 2019-08-11 NOTE — ED Notes (Signed)
Called lab to add on trop. States running and almost finished.

## 2019-08-11 NOTE — ED Provider Notes (Signed)
[facility] Emergency Department Provider Note  ____________________________________________  Time seen: Approximately 12:37 AM  I have reviewed the triage vital signs and the nursing notes.   HISTORY  Chief Complaint Respiratory Distress   HPI Julie Mcmillan is a 78 y.o. female history of A. fib on Eliquis, pulmonary hypertension, pulmonary fibrosis, diabetes, hypertension who presents for evaluation of shortness of breath.  Patient reports that she has been feeling sick for a month.  She reports fatigue, decreased energy, progressively worsening shortness of breath.  No changes in her chronic cough, no chest pain, no fever.  She has had body aches and most recently a mild sore throat and loss of taste which started about a week ago.  She saw her primary care doctor 2 weeks ago had a chest x-ray that was negative for pneumonia and a Covid swab that was negative.  She was put on prednisone for 10 days with no significant improvement.  She has had several daily episodes of diarrhea throughout the entire month.  No vomiting.  Patient is on chronic oxygen supplementation of 3 L nasal cannula.  Patient denies any personal or family history of blood clots, recent travel immobilization, leg pain or swelling, hemoptysis, or exogenous hormones.   Past Medical History:  Diagnosis Date   A-fib ([place])    Cancer ([place])    skin   Chronic diarrhea    Diverticulitis    GERD (gastroesophageal reflux disease)    usually takes protonix   Hip fracture ([place]) 11/2015   hairline fracture on right.  no surgery, just physical therapy   Hypertension    IBS (irritable bowel syndrome)    IBS (irritable bowel syndrome)    Interstitial lung disease ([place])    does not remember when   Pulmonary fibrosis ([place])    Pulmonary hypertension (HCC)    Seasonal affective disorder (HCC)    Shortness of breath dyspnea    with exertion   Type 2 diabetes mellitus ([place]) 11/04/2014   pt states this has resolved with diet/exercise 11/07/18    Patient Active Problem List   Diagnosis Date Noted   Closed left hip fracture ([place]) 11/07/2018   Enterovesical fistula    Acute diverticulitis 08/12/2017   Colonic diverticular abscess    Dyspnea 10/27/2016   Interstitial lung disease ([place]) 08/31/2016   Depression, major, single episode, complete remission ([place]) 02/23/2016   DOE (dyspnea on exertion) 01/26/2016   Acute respiratory failure with hypoxia ([place]) 11/29/2015   Fracture of greater trochanter of right femur ([place]) 11/29/2015   Fall 11/26/2015   OP (osteoporosis) 07/18/2015   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   Chemical diabetes 07/18/2015   Adult hypothyroidism 07/18/2015   Fatigue 07/18/2015   Anxiety 07/18/2015   Diverticulitis large intestine 07/04/2015   Atrial fibrillation with rapid ventricular response ([place]) 03/25/2015   PAF (paroxysmal atrial fibrillation) ([place]) 03/25/2015   Acquired hypothyroidism 01/11/2015   Anemia, iron deficiency 01/10/2015   Type 2 diabetes mellitus ([place]) 11/04/2014   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   Essential (primary) hypertension 11/04/2014   Temporary cerebral vascular dysfunction 04/08/2014   Dry mouth 01/05/2013   Cannot sleep 01/05/2013   Obstructive apnea 09/02/2012   Bursitis, ischial 05/19/2012   GERD (gastroesophageal reflux disease) 02/01/2012   DD (diverticular disease) 08/14/2011    Past Surgical History:  Procedure Laterality Date   ABDOMINAL HYSTERECTOMY     partial   APPENDECTOMY     BREAST BIOPSY Left  core bx- neg   CATARACT EXTRACTION W/PHACO Right 03/16/2015   Procedure: CATARACT EXTRACTION PHACO AND INTRAOCULAR LENS PLACEMENT (IOC);  Surgeon: Leandrew Koyanagi, MD;  Location: [place];  Service: Ophthalmology;  Laterality: Right;   CATARACT EXTRACTION W/PHACO Left 06/13/2016   Procedure: CATARACT EXTRACTION PHACO AND INTRAOCULAR  LENS PLACEMENT (IOC);  Surgeon: Leandrew Koyanagi, MD;  Location: [place];  Service: Ophthalmology;  Laterality: Left;  pre-diabetic - diet controlled   COLONOSCOPY     COLONOSCOPY WITH PROPOFOL N/A 05/25/2016   Procedure: COLONOSCOPY WITH PROPOFOL;  Surgeon: Manya Silvas, MD;  Location[facility] ENDOSCOPY;  Service: Endoscopy;  Laterality: N/A;   ESOPHAGOGASTRODUODENOSCOPY (EGD) WITH PROPOFOL N/A 05/25/2016   Procedure: ESOPHAGOGASTRODUODENOSCOPY (EGD) WITH PROPOFOL;  Surgeon: Manya Silvas, MD;  Location[facility] ENDOSCOPY;  Service: Endoscopy;  Laterality: N/A;   NISSEN FUNDOPLICATION  1478   came apart in 2016 and ibs and reflux returned   OPEN REDUCTION INTERNAL FIXATION (ORIF) DISTAL RADIAL FRACTURE Right 01/13/2016   Procedure: OPEN REDUCTION INTERNAL FIXATION (ORIF) DISTAL RADIAL FRACTURE;  Surgeon: Hessie Knows, MD;  Location: [facility] ORS;  Service: Orthopedics;  Laterality: Right;   TONSILLECTOMY     and adenoidectomy    Prior to Admission medications   Medication Sig Start Date End Date Taking? Authorizing Provider  acetaminophen (TYLENOL) 500 MG tablet Take 1,000 mg by mouth at bedtime.    [provider]  busPIRone (BUSPAR) 7.5 MG tablet Take 7.5 mg by mouth 2 (two) times daily. 10/22/18   [provider]  Calcium Carbonate-Vitamin D (CALCIUM 600+D) 600-200 MG-UNIT TABS Take 1 tablet by mouth daily.    [provider]  cholestyramine (QUESTRAN) 4 g packet Take 4 g by mouth daily before breakfast.  10/05/18   [provider]  diltiazem (CARDIZEM CD) 180 MG 24 hr capsule Take 180 mg by mouth daily. 10/04/18   [provider]  diltiazem (CARDIZEM) 30 MG tablet Take 30 mg by mouth as needed (palpitations for more than 30 minutes).    [provider]  FLUoxetine (PROZAC) 40 MG capsule Take 40 mg by mouth daily. 10/22/18   [provider]  furosemide (LASIX) 20 MG tablet Take 20 mg by mouth daily.    [provider]  ibandronate (BONIVA) 150 MG tablet Take 150 mg by mouth every 30 (thirty) days.  05/21/18   [provider]  levothyroxine (SYNTHROID, LEVOTHROID) 75 MCG tablet Take 75 mcg by mouth daily before breakfast.     [provider]  meclizine (ANTIVERT) 25 MG tablet Take 25 mg by mouth 3 (three) times daily as needed (vertigo).  03/27/18   [provider]  Multiple Vitamin (MULTIVITAMIN WITH MINERALS) TABS tablet Take 1 tablet by mouth daily.    [provider]  ondansetron (ZOFRAN) 4 MG tablet Take 4 mg by mouth every 8 (eight) hours as needed for nausea.  04/02/18   [provider]  pantoprazole (PROTONIX) 40 MG tablet Take 1 tablet (40 mg total) by mouth daily. 03/29/15   Glendon Axe, MD  predniSONE (DELTASONE) 10 MG tablet Take 10 mg by mouth daily with breakfast.    [provider]  sotalol (BETAPACE) 80 MG tablet Take 1 tablet (80 mg total) by mouth every 12 (twelve) hours. 03/29/15   Glendon Axe, MD  tadalafil, PAH, (ADCIRCA) 20 MG tablet Take 40 mg by mouth at bedtime.  03/25/17   [provider]  vitamin C (ASCORBIC ACID) 500 MG tablet Take 500  mg by mouth daily.    [provider]  zaleplon (SONATA) 5 MG capsule Take 5 mg by mouth at bedtime.  10/22/18   [provider]    Allergies Amlodipine, Levofloxacin, Nexium [esomeprazole magnesium], and Aspartame  Family History  Problem Relation Age of Onset   Breast cancer Mother 29   Diabetes Mellitus II Mother    Hypothyroidism Mother    Atrial fibrillation Mother    Heart attack Father    Diabetes Maternal Grandmother    Heart attack Maternal Grandfather     Social History Social History   Tobacco Use   Smoking status: Never Smoker   Smokeless tobacco: Never Used   Tobacco comment: no passive smokers in home  Substance Use Topics   Alcohol use: No    Alcohol/week: 0.0 standard drinks   Drug use: No    Review of  Systems  Constitutional: Negative for fever. + fatigue, body aches Eyes: Negative for visual changes. ENT: Negative for sore throat. Neck: No neck pain  Cardiovascular: Negative for chest pain. Respiratory: + shortness of breath. Gastrointestinal: Negative for abdominal pain, vomiting. + diarrhea. Genitourinary: Negative for dysuria. Musculoskeletal: Negative for back pain. Skin: Negative for rash. Neurological: Negative for headaches, weakness or numbness. Psych: No SI or HI  ____________________________________________   PHYSICAL EXAM:  VITAL SIGNS: ED Triage Vitals  Enc Vitals Group     BP 08/10/19 1913 (!) 143/62     Pulse Rate 08/10/19 1913 97     Resp 08/10/19 1913 20     Temp 08/10/19 1913 98.7 F (37.1 C)     Temp Source 08/10/19 1913 Oral     SpO2 08/10/19 1913 98 %     Weight 08/10/19 1914 150 lb (68 kg)     Height 08/10/19 1914 _0  (1.626 m)     Head Circumference --      Peak Flow --      Pain Score 08/10/19 1914 3     Pain Loc --      Pain Edu? --      Excl. in [place]? --     Constitutional: Alert and oriented. Well appearing and in no apparent distress. HEENT:      Head: Normocephalic and atraumatic.         Eyes: Conjunctivae are normal. Sclera is non-icteric.       Mouth/Throat: Mucous membranes are moist.       Neck: Supple with no signs of meningismus. Cardiovascular: Regular rate and rhythm. No murmurs, gallops, or rubs. 2+ symmetrical distal pulses are present in all extremities. No JVD. Respiratory: Normal respiratory effort.  Normal sats on 3 L nasal cannula, faint crackles bilaterally with good air movement.  No wheezing  gastrointestinal: Soft, non tender, and non distended with positive bowel sounds. No rebound or guarding. Musculoskeletal: Nontender with normal range of motion in all extremities. No edema, cyanosis, or erythema of extremities. Neurologic: Normal speech and language. Face is symmetric. Moving all extremities. No gross focal  neurologic deficits are appreciated. Skin: Skin is warm, dry and intact. No rash noted. Psychiatric: Mood and affect are normal. Speech and behavior are normal.  ____________________________________________   LABS (all labs ordered are listed, but only abnormal results are displayed)  Labs Reviewed  CBC WITH DIFFERENTIAL/PLATELET - Abnormal; Notable for the following components:      Result Value   WBC 11.6 (*)    Hemoglobin 11.1 (*)    HCT 35.6 (*)    MCV  78.1 (*)    MCH 24.3 (*)    Monocytes Absolute 2.3 (*)    Abs Immature Granulocytes 0.10 (*)    All other components within normal limits  COMPREHENSIVE METABOLIC PANEL - Abnormal; Notable for the following components:   Potassium 3.4 (*)    CO2 20 (*)    Glucose, Bld 153 (*)    Calcium 8.4 (*)    Total Protein 6.1 (*)    All other components within normal limits  SARS CORONAVIRUS 2 (TAT 6-24 HRS)  TROPONIN I (HIGH SENSITIVITY)   ____________________________________________  EKG  ED ECG REPORT I, Rudene Re, the attending physician, personally viewed and interpreted this ECG.  Normal sinus rhythm, rate of 94, normal intervals, normal axis, no ST elevations or depressions.  No significant changes when compared to prior.  01:54 -A. fib with RVR  06:03 AM -normal sinus rhythm, rate of 64, normal intervals, normal axis, no ST elevations or depressions, T wave inversions in inferior leads.  Unchanged from prior. ____________________________________________  RADIOLOGY  I have personally reviewed the images performed during this visit and I agree with the Radiologist's read.   Interpretation by Radiologist:  Dg Chest 2 View  Result Date: 08/10/2019 CLINICAL DATA:  Pt to the er for difficulty breathing. Pt has been sick for a month with no improvement. Pt now has loss of taste. Pt sleeps on 2L of O2 and functions ion 3L but has had to increase her O2. Pt states her sats drop into the 80s wi.*comment was  truncated*diff breathing, poss covid EXAM: CHEST - 2 VIEW COMPARISON:  Radiograph 02/21/2017 FINDINGS: Stable cardiac silhouette. There is increased interstitial markings LEFT and RIGHT lung. No focal consolidation. No pneumothorax. Large hiatal hernia noted. IMPRESSION: 1. Increased interstitial markings in LEFT and RIGHT lung concerning for progression of interstitial lung disease. Consider follow-up high-resolution CT. 2. No acute findings. 3. Large hiatal hernia. Electronically Signed   By: Suzy Bouchard M.D.   On: 08/10/2019 20:00   Ct Angio Chest Pe W And/or Wo Contrast  Result Date: 08/11/2019 CLINICAL DATA:  Shortness of breath and weakness EXAM: CT ANGIOGRAPHY CHEST WITH CONTRAST TECHNIQUE: Multidetector CT imaging of the chest was performed using the standard protocol during bolus administration of intravenous contrast. Multiplanar CT image reconstructions and MIPs were obtained to evaluate the vascular anatomy. CONTRAST:  1m OMNIPAQUE IOHEXOL 350 MG/ML SOLN COMPARISON:  10/27/2016 FINDINGS: Cardiovascular: Atherosclerotic calcifications are noted within the thoracic aorta. Normal branching pattern is noted. No aneurysmal dilatation or dissection is seen. The pulmonary artery shows a normal branching pattern. No filling defects are identified to suggest pulmonary embolism. Mediastinum/Nodes: Esophagus as visualized is within normal limits with the exception of a sliding-type hiatal hernia. The thoracic inlet is within normal limits. Stable precarinal and bilateral hilar lymph nodes are seen of a chronic nature. Lungs/Pleura: Chronic interstitial changes are again identified slightly increased to that seen on the prior exam. No focal confluent infiltrate is noted to suggest acute on chronic abnormality. No sizable effusion is noted. No nodules are seen. Upper Abdomen: No acute abnormality. Musculoskeletal: Stable T8 compression deformity is noted. Review of the MIP images confirms the above  findings. IMPRESSION: No evidence of pulmonary emboli. Hiatal hernia. Chronic appearing interstitial changes slightly increase in that seen on prior CT. Aortic Atherosclerosis (ICD10-I70.0). Electronically Signed   By: MInez CatalinaM.D.   On: 08/11/2019 01:32     ____________________________________________   PROCEDURES  Procedure(s) performed: None Procedures Critical Care performed:  None ____________________________________________   INITIAL IMPRESSION / ASSESSMENT AND PLAN / ED COURSE   78 y.o. female history of A. fib on Eliquis, pulmonary hypertension, pulmonary fibrosis, diabetes, hypertension who presents for evaluation of 1 month of progressively worsening shortness of breath, fatigue, decrease energy, diarrhea, body aches, sore throat and now with loss of taste.  Patient is in no obvious respiratory distress, she is satting well on her home O2 with faint crackles which is expected on a patient with pulmonary fibrosis.  Chest x-ray concerning for progression of interstitial lung disease.  Radiologist recommended CT.  We will do a CT angiogram to look at the lung parenchyma but also to evaluate for possible PE.  Will repeat Covid swab.  Patient looks slightly dry on exam with diarrhea for the last month.  Will give IV fluids.  _________________________ 1:59 AM on 08/11/2019 -----------------------------------------  CT showing progression of interstitial lung disease but no infiltrates and no PE.  After returning from CT patient went into A. fib with RVR.  She has not taken her evening Cardizem and sotalol.  Rate in the 140s.  She has no chest pain but does feel palpitations.  Will give a dose of IV Cardizem and give her evening dose of long-acting Cardizem and sotalol and reassess for disposition.   Clinical Course as of Aug 10 605  Tue Aug 11, 2019  6440 After 2 rounds of IV cardizem, PO cardizem, IV mag, and PO sotalol patient converted back to NSR. Will dc home with f/u with  her pulmonologist.   [CV]    Clinical Course User Index [CV] Alfred Levins [place], MD       As part of my medical decision making, I reviewed the following data within the [place] notes reviewed and incorporated, Labs reviewed , EKG interpreted , Old EKG reviewed, Old chart reviewed, Radiograph reviewed , Discussed with admitting physician , Notes from prior ED visits and [place] Controlled Substance Database   Patient was evaluated in Emergency Department today for the symptoms described in the history of present illness. Patient was evaluated in the context of the global COVID-19 pandemic, which necessitated consideration that the patient might be at risk for infection with the SARS-CoV-2 virus that causes COVID-19. Institutional protocols and algorithms that pertain to the evaluation of patients at risk for COVID-19 are in a state of rapid change based on information released by regulatory bodies including the CDC and federal and state organizations. These policies and algorithms were followed during the patient's care in the ED.   ____________________________________________   FINAL CLINICAL IMPRESSION(S) / ED DIAGNOSES   Final diagnoses:  Pulmonary fibrosis ([place])  Atrial fibrillation with RVR ([place])      NEW MEDICATIONS STARTED DURING THIS VISIT:  ED Discharge Orders    None       Note:  This document was prepared using Dragon voice recognition software and may include unintentional dictation errors.    Rudene Re, MD 08/11/19 [id number]

## 2019-08-11 NOTE — ED Notes (Signed)
Meds received from pharm. Handed to [employer] who is presently in room with pt.

## 2019-08-11 NOTE — ED Notes (Signed)
2nd grn tube sent to lab in case of 2nd trop.

## 2019-08-11 NOTE — ED Notes (Signed)
This RN noted via monitor that pt's HR inc; notified EDP Veronese in person.

## 2019-08-11 NOTE — ED Notes (Addendum)
Pt discharged , on chronic O2 at 3liter.  Pt attempting to call family for a ride .

## 2019-08-11 NOTE — ED Notes (Signed)
Pt leaving for CT angio.

## 2019-09-04 ENCOUNTER — Other Ambulatory Visit: Payer: Self-pay

## 2019-09-04 ENCOUNTER — Emergency Department
Admission: EM | Admit: 2019-09-04 | Discharge: 2019-09-04 | Disposition: A | Discharge status: Home / Self Care | Payer: Medicare HMO | Attending: Emergency Medicine | Admitting: Emergency Medicine

## 2019-09-04 ENCOUNTER — Emergency Department: Payer: Medicare HMO

## 2019-09-04 DIAGNOSIS — Z85828 Personal history of other malignant neoplasm of skin: Secondary | ICD-10-CM | POA: Diagnosis not present

## 2019-09-04 DIAGNOSIS — E119 Type 2 diabetes mellitus without complications: Secondary | ICD-10-CM | POA: Diagnosis not present

## 2019-09-04 DIAGNOSIS — I10 Essential (primary) hypertension: Secondary | ICD-10-CM | POA: Insufficient documentation

## 2019-09-04 DIAGNOSIS — Z79899 Other long term (current) drug therapy: Secondary | ICD-10-CM | POA: Diagnosis not present

## 2019-09-04 DIAGNOSIS — E039 Hypothyroidism, unspecified: Secondary | ICD-10-CM | POA: Insufficient documentation

## 2019-09-04 DIAGNOSIS — R1032 Left lower quadrant pain: Secondary | ICD-10-CM | POA: Diagnosis present

## 2019-09-04 DIAGNOSIS — K5790 Diverticulosis of intestine, part unspecified, without perforation or abscess without bleeding: Secondary | ICD-10-CM | POA: Insufficient documentation

## 2019-09-04 DIAGNOSIS — K529 Noninfective gastroenteritis and colitis, unspecified: Secondary | ICD-10-CM | POA: Diagnosis not present

## 2019-09-04 LAB — URINALYSIS, COMPLETE (UACMP) WITH MICROSCOPIC
Bacteria, UA: NONE SEEN
Bilirubin Urine: NEGATIVE
Glucose, UA: NEGATIVE mg/dL
Hgb urine dipstick: NEGATIVE
Ketones, ur: NEGATIVE mg/dL
Nitrite: NEGATIVE
Protein, ur: NEGATIVE mg/dL
Specific Gravity, Urine: >1.046 — ABNORMAL HIGH (ref 1.005–1.030)
pH: 6 (ref 5.0–8.0)

## 2019-09-04 LAB — COMPREHENSIVE METABOLIC PANEL
ALT: 10 U/L (ref 0–44)
AST: 17 U/L (ref 15–41)
Albumin: 3.7 g/dL (ref 3.5–5.0)
Alkaline Phosphatase: 47 U/L (ref 38–126)
Anion gap: 11 (ref 5–15)
BUN: 21 mg/dL (ref 8–23)
CO2: 28 mmol/L (ref 22–32)
Calcium: 8.6 mg/dL — ABNORMAL LOW (ref 8.9–10.3)
Chloride: 99 mmol/L (ref 98–111)
Creatinine, Ser: 0.98 mg/dL (ref 0.44–1.00)
GFR calc Af Amer: >60 mL/min (ref >60)
GFR calc non Af Amer: 55 mL/min — ABNORMAL LOW (ref >60)
Glucose, Bld: 125 mg/dL — ABNORMAL HIGH (ref 70–99)
Potassium: 3.8 mmol/L (ref 3.5–5.1)
Sodium: 138 mmol/L (ref 135–145)
Total Bilirubin: 0.5 mg/dL (ref 0.3–1.2)
Total Protein: 6 g/dL — ABNORMAL LOW (ref 6.5–8.1)

## 2019-09-04 LAB — CBC
HCT: 33.3 % — ABNORMAL LOW (ref 36.0–46.0)
Hemoglobin: 10.6 g/dL — ABNORMAL LOW (ref 12.0–15.0)
MCH: 23.9 pg — ABNORMAL LOW (ref 26.0–34.0)
MCHC: 31.8 g/dL (ref 30.0–36.0)
MCV: 75.2 fL — ABNORMAL LOW (ref 80.0–100.0)
Platelets: 264 10*3/uL (ref 150–400)
RBC: 4.43 MIL/uL (ref 3.87–5.11)
RDW: 14.1 % (ref 11.5–15.5)
WBC: 14.8 10*3/uL — ABNORMAL HIGH (ref 4.0–10.5)
nRBC: 0 % (ref 0.0–0.2)

## 2019-09-04 LAB — LIPASE, BLOOD: Lipase: 21 U/L (ref 11–51)

## 2019-09-04 MED ORDER — IOHEXOL 300 MG/ML  SOLN
100.0000 mL | Freq: Once | INTRAMUSCULAR | Status: AC | PRN
  Administered 2019-09-04: 100 mL via INTRAVENOUS

## 2019-09-04 MED ORDER — MORPHINE SULFATE (PF) 4 MG/ML IV SOLN
4.0000 mg | Freq: Once | INTRAVENOUS | Status: AC
  Administered 2019-09-04: 4 mg via INTRAVENOUS
  Filled 2019-09-04: qty 1

## 2019-09-04 MED ORDER — LACTATED RINGERS IV BOLUS
1000.0000 mL | Freq: Once | INTRAVENOUS | Status: AC
  Administered 2019-09-04: 09:00:00 1000 mL via INTRAVENOUS

## 2019-09-04 NOTE — ED Triage Notes (Signed)
Pt came in by EMS with left sided abd pain for 2 days states got worse after Thanksgiving. Pt does have diverticulitis, has had some diarrhea. Denies any vomiting or fever.

## 2019-09-04 NOTE — ED Provider Notes (Signed)
[facility] Emergency Department Provider Note   ____________________________________________   First MD Initiated Contact with Patient 09/04/19 [phone]     (approximate)  I have reviewed the triage vital signs and the nursing notes.   HISTORY  Chief Complaint Abdominal Pain    HPI Julie Mcmillan is a 78 y.o. female with past medical history of pulmonary fibrosis on 2 L nasal cannula, A. fib on Eliquis, diabetes, and IBS who presents to the ED complaining of abdominal pain.  Patient reports she has had 2 days of gradually worsening abdominal pain, primarily affecting her left lower quadrant.  She describes the pain as constant and not exacerbated or alleviated by anything.  It is been associated with some diarrhea, but she denies any blood in her stool.  She has not had any fevers, chills, nausea, vomiting, dysuria, or hematuria.  She also denies any recent respiratory symptoms or chest pain, is not aware of any sick contacts.  She describes symptoms as similar to prior episodes of diverticulitis.        Past Medical History:  Diagnosis Date  . A-fib ([place])   . Cancer ([place])    skin  . Chronic diarrhea   . Diverticulitis   . GERD (gastroesophageal reflux disease)    usually takes protonix  . Hip fracture ([place]) 11/2015   hairline fracture on right.  no surgery, just physical therapy  . Hypertension   . IBS (irritable bowel syndrome)   . IBS (irritable bowel syndrome)   . Interstitial lung disease ([place])    does not remember when  . Pulmonary fibrosis ([place])   . Pulmonary hypertension ([place])   . Seasonal affective disorder ([place])   . Shortness of breath dyspnea    with exertion  . Type 2 diabetes mellitus ([place]) 11/04/2014   pt states this has resolved with diet/exercise 11/07/18    Patient Active Problem List   Diagnosis Date Noted  . Closed left hip fracture ([place]) 11/07/2018  . Enterovesical fistula   . Acute diverticulitis 08/12/2017  . Colonic  diverticular abscess   . Dyspnea 10/27/2016  . Interstitial lung disease ([place]) 08/31/2016  . Depression, major, single episode, complete remission ([place]) 02/23/2016  . DOE (dyspnea on exertion) 01/26/2016  . Acute respiratory failure with hypoxia ([place]) 11/29/2015  . Fracture of greater trochanter of right femur ([place]) 11/29/2015  . Fall 11/26/2015  . OP (osteoporosis) 07/18/2015  . 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 Adult hypothyroidism 07/18/2015  . Fatigue 07/18/2015  . Anxiety 07/18/2015  . Diverticulitis large intestine 07/04/2015  . Atrial fibrillation with rapid ventricular response ([place]) 03/25/2015  . PAF (paroxysmal atrial fibrillation) ([place]) 03/25/2015  . Acquired hypothyroidism 01/11/2015  . Anemia, iron deficiency 01/10/2015  . Type 2 diabetes mellitus ([place]) 11/04/2014  . 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 Essential (primary) hypertension 11/04/2014  . Temporary cerebral vascular dysfunction 04/08/2014  . Dry mouth 01/05/2013  . Cannot sleep 01/05/2013  . Obstructive apnea 09/02/2012  . Bursitis, ischial 05/19/2012  . GERD (gastroesophageal reflux disease) 02/01/2012  . DD (diverticular disease) 08/14/2011    Past Surgical History:  Procedure Laterality Date  . ABDOMINAL HYSTERECTOMY     partial  . APPENDECTOMY    . BREAST BIOPSY Left    core bx- neg  . CATARACT EXTRACTION W/PHACO Right 03/16/2015   Procedure: CATARACT EXTRACTION PHACO AND INTRAOCULAR LENS PLACEMENT (IOC);  Surgeon: Leandrew Koyanagi, MD;  Location: [facility];  Service: Ophthalmology;  Laterality: Right;  . CATARACT EXTRACTION W/PHACO Left 06/13/2016   Procedure: CATARACT EXTRACTION PHACO AND INTRAOCULAR LENS PLACEMENT (IOC);  Surgeon: Leandrew Koyanagi, MD;  Location: [facility];  Service: Ophthalmology;  Laterality: Left;  pre-diabetic - diet controlled  . COLONOSCOPY    . COLONOSCOPY WITH PROPOFOL N/A 05/25/2016    Procedure: COLONOSCOPY WITH PROPOFOL;  Surgeon: Manya Silvas, MD;  Location: [facility] ENDOSCOPY;  Service: Endoscopy;  Laterality: N/A;  . ESOPHAGOGASTRODUODENOSCOPY (EGD) WITH PROPOFOL N/A 05/25/2016   Procedure: ESOPHAGOGASTRODUODENOSCOPY (EGD) WITH PROPOFOL;  Surgeon: Manya Silvas, MD;  Location: [facility] ENDOSCOPY;  Service: Endoscopy;  Laterality: N/A;  . NISSEN FUNDOPLICATION  1610   came apart in 2016 and ibs and reflux returned  . OPEN REDUCTION INTERNAL FIXATION (ORIF) DISTAL RADIAL FRACTURE Right 01/13/2016   Procedure: OPEN REDUCTION INTERNAL FIXATION (ORIF) DISTAL RADIAL FRACTURE;  Surgeon: Hessie Knows, MD;  Location: [facility] ORS;  Service: Orthopedics;  Laterality: Right;  . TONSILLECTOMY     and adenoidectomy    Prior to Admission medications   Medication Sig Start Date End Date Taking? Authorizing Provider  acetaminophen (TYLENOL) 500 MG tablet Take 1,000 mg by mouth at bedtime.    [provider]  busPIRone (BUSPAR) 7.5 MG tablet Take 7.5 mg by mouth 2 (two) times daily. 10/22/18   [provider]  Calcium Carbonate-Vitamin D (CALCIUM 600+D) 600-200 MG-UNIT TABS Take 1 tablet by mouth daily.    [provider]  cholestyramine (QUESTRAN) 4 g packet Take 4 g by mouth daily before breakfast.  10/05/18   [provider]  diltiazem (CARDIZEM CD) 180 MG 24 hr capsule Take 180 mg by mouth daily. 10/04/18   [provider]  diltiazem (CARDIZEM) 30 MG tablet Take 30 mg by mouth as needed (palpitations for more than 30 minutes).    [provider]  FLUoxetine (PROZAC) 40 MG capsule Take 40 mg by mouth daily. 10/22/18   [provider]  furosemide (LASIX) 20 MG tablet Take 20 mg by mouth daily.    [provider]  ibandronate (BONIVA) 150 MG tablet Take 150 mg by mouth every 30 (thirty) days.  05/21/18   [provider]  levothyroxine (SYNTHROID, LEVOTHROID) 75 MCG tablet Take 75 mcg by mouth daily before breakfast.      [provider]  meclizine (ANTIVERT) 25 MG tablet Take 25 mg by mouth 3 (three) times daily as needed (vertigo).  03/27/18   [provider]  Multiple Vitamin (MULTIVITAMIN WITH MINERALS) TABS tablet Take 1 tablet by mouth daily.    [provider]  ondansetron (ZOFRAN) 4 MG tablet Take 4 mg by mouth every 8 (eight) hours as needed for nausea.  04/02/18   [provider]  pantoprazole (PROTONIX) 40 MG tablet Take 1 tablet (40 mg total) by mouth daily. 03/29/15   Glendon Axe, MD  predniSONE (DELTASONE) 10 MG tablet Take 10 mg by mouth daily with breakfast.    [provider]  sotalol (BETAPACE) 80 MG tablet Take 1 tablet (80 mg total) by mouth every 12 (twelve) hours. 03/29/15   Glendon Axe, MD  tadalafil, PAH, (ADCIRCA) 20 MG tablet Take 40 mg by mouth at bedtime.  03/25/17   [provider]  vitamin C (ASCORBIC ACID) 500 MG tablet Take 500 mg by mouth daily.    [provider]  zaleplon (SONATA) 5 MG capsule Take 5 mg by mouth at bedtime.  10/22/18   [provider]  Allergies Amlodipine, Levofloxacin, Nexium [esomeprazole magnesium], and Aspartame  Family History  Problem Relation Age of Onset  . Breast cancer Mother 12  . Diabetes Mellitus II Mother   . Hypothyroidism Mother   . Atrial fibrillation Mother   . Heart attack Father   . Diabetes Maternal Grandmother   . Heart attack Maternal Grandfather     Social History Social History   Tobacco Use  . Smoking status: Never Smoker  . Smokeless tobacco: Never Used  . Tobacco comment: no passive smokers in home  Substance Use Topics  . Alcohol use: No    Alcohol/week: 0.0 standard drinks  . Drug use: No    Review of Systems  Constitutional: No fever/chills Eyes: No visual changes. ENT: No sore throat. Cardiovascular: Denies chest pain. Respiratory: Denies shortness of breath. Gastrointestinal: Positive for abdominal pain.  No nausea, no vomiting.   Positive for diarrhea.  No constipation. Genitourinary: Negative for dysuria. Musculoskeletal: Negative for back pain. Skin: Negative for rash. Neurological: Negative for headaches, focal weakness or numbness.  ____________________________________________   PHYSICAL EXAM:  VITAL SIGNS: ED Triage Vitals  Enc Vitals Group     BP 09/04/19 0607 (!) 127/49     Pulse Rate 09/04/19 0607 60     Resp 09/04/19 0607 20     Temp 09/04/19 0607 98.5 F (36.9 C)     Temp Source 09/04/19 0607 Oral     SpO2 09/04/19 0607 99 %     Weight 09/04/19 0605 150 lb (68 kg)     Height 09/04/19 0605 _0  (1.626 m)     Head Circumference --      Peak Flow --      Pain Score 09/04/19 0604 5     Pain Loc --      Pain Edu? --      Excl. in [place]? --     Constitutional: Alert and oriented. Eyes: Conjunctivae are normal. Head: Atraumatic. Nose: No congestion/rhinnorhea. Mouth/Throat: Mucous membranes are moist. Neck: Normal ROM Cardiovascular: Normal rate, regular rhythm. Grossly normal heart sounds. Respiratory: Normal respiratory effort.  No retractions.  Scattered expiratory wheezing. Gastrointestinal: Soft and tender to palpation greatest in the left lower quadrant with no rebound or guarding. No distention. Genitourinary: deferred Musculoskeletal: No lower extremity tenderness nor edema. Neurologic:  Normal speech and language. No gross focal neurologic deficits are appreciated. Skin:  Skin is warm, dry and intact. No rash noted. Psychiatric: Mood and affect are normal. Speech and behavior are normal.  ____________________________________________   LABS (all labs ordered are listed, but only abnormal results are displayed)  Labs Reviewed  CBC - Abnormal; Notable for the following components:      Result Value   WBC 14.8 (*)    Hemoglobin 10.6 (*)    HCT 33.3 (*)    MCV 75.2 (*)    MCH 23.9 (*)    All other components within normal limits  COMPREHENSIVE METABOLIC PANEL - Abnormal;  Notable for the following components:   Glucose, Bld 125 (*)    Calcium 8.6 (*)    Total Protein 6.0 (*)    GFR calc non Af Amer 55 (*)    All other components within normal limits  URINALYSIS, COMPLETE (UACMP) WITH MICROSCOPIC - Abnormal; Notable for the following components:   Color, Urine YELLOW (*)    APPearance CLEAR (*)    Specific Gravity, Urine >1.046 (*)    Leukocytes,Ua TRACE (*)    All other components within normal limits  URINE CULTURE  LIPASE, BLOOD     PROCEDURES  Procedure(s) performed (including Critical Care):  Procedures   ____________________________________________   INITIAL IMPRESSION / ASSESSMENT AND PLAN / ED COURSE       78 year old female presents to the ED with 2 days of gradually worsening left lower quadrant abdominal pain associated with diarrhea.  She has focal tenderness in her left lower quadrant but a nonperitoneal exam.  Suspect diverticulitis and will obtain CT to assess for this.  Labs are thus far reassuring, UA pending.  Differential would also include UTI, appendicitis, gastritis, gastroenteritis.  Do not suspect cardiac etiology of patient's symptoms.  CT is reassuring, shows diverticulosis without evidence of diverticulitis.  Patient also with what appears to be healing fluid collection, no evidence of acute abscess or fistula.  UA also does not show convincing evidence of infection.  Patient reports symptoms are improved, suspect gastroenteritis given her diarrhea.  Counseled patient to try loperamide and follow-up with her PCP, otherwise return to the ED for new or worsening symptoms.  Patient agrees to plan.      ____________________________________________   FINAL CLINICAL IMPRESSION(S) / ED DIAGNOSES  Final diagnoses:  Gastroenteritis  Diverticulosis     ED Discharge Orders    None       Note:  This document was prepared using Dragon voice recognition software and may include unintentional dictation errors.    Blake Divine, MD 09/04/19 1030

## 2019-09-04 NOTE — ED Notes (Signed)
Called lab to check on urine results, per Elsie Stain urine was received and was insufficient quantity, RN in ER was notified. Will inform MD [place] and obtain new sample.

## 2019-09-04 NOTE — ED Notes (Signed)
2nd urine samples sent to lab

## 2019-09-04 NOTE — ED Notes (Signed)
Patient transported to CT 

## 2019-09-06 LAB — URINE CULTURE: Culture: <10000 — AB

## 2019-12-29 ENCOUNTER — Other Ambulatory Visit: Payer: Self-pay

## 2019-12-29 ENCOUNTER — Emergency Department
Admission: EM | Admit: 2019-12-29 | Discharge: 2019-12-29 | Disposition: A | Discharge status: Home / Self Care | Payer: Medicare HMO | Source: Home / Self Care | Attending: Student in an Organized Health Care Education/Training Program | Admitting: Student in an Organized Health Care Education/Training Program

## 2019-12-29 ENCOUNTER — Emergency Department: Payer: Medicare HMO

## 2019-12-29 DIAGNOSIS — I4891 Unspecified atrial fibrillation: Secondary | ICD-10-CM | POA: Insufficient documentation

## 2019-12-29 DIAGNOSIS — Z85828 Personal history of other malignant neoplasm of skin: Secondary | ICD-10-CM | POA: Insufficient documentation

## 2019-12-29 DIAGNOSIS — E119 Type 2 diabetes mellitus without complications: Secondary | ICD-10-CM | POA: Insufficient documentation

## 2019-12-29 DIAGNOSIS — R197 Diarrhea, unspecified: Secondary | ICD-10-CM | POA: Insufficient documentation

## 2019-12-29 DIAGNOSIS — R112 Nausea with vomiting, unspecified: Secondary | ICD-10-CM | POA: Insufficient documentation

## 2019-12-29 DIAGNOSIS — Z79899 Other long term (current) drug therapy: Secondary | ICD-10-CM | POA: Insufficient documentation

## 2019-12-29 DIAGNOSIS — I10 Essential (primary) hypertension: Secondary | ICD-10-CM | POA: Insufficient documentation

## 2019-12-29 DIAGNOSIS — K529 Noninfective gastroenteritis and colitis, unspecified: Secondary | ICD-10-CM | POA: Diagnosis not present

## 2019-12-29 LAB — COMPREHENSIVE METABOLIC PANEL
ALT: 19 U/L (ref 0–44)
AST: 25 U/L (ref 15–41)
Albumin: 3.5 g/dL (ref 3.5–5.0)
Alkaline Phosphatase: 66 U/L (ref 38–126)
Anion gap: 11 (ref 5–15)
BUN: 20 mg/dL (ref 8–23)
CO2: 23 mmol/L (ref 22–32)
Calcium: 8.9 mg/dL (ref 8.9–10.3)
Chloride: 102 mmol/L (ref 98–111)
Creatinine, Ser: 1.01 mg/dL — ABNORMAL HIGH (ref 0.44–1.00)
GFR calc Af Amer: >60 mL/min (ref >60)
GFR calc non Af Amer: 53 mL/min — ABNORMAL LOW (ref >60)
Glucose, Bld: 246 mg/dL — ABNORMAL HIGH (ref 70–99)
Potassium: 4.4 mmol/L (ref 3.5–5.1)
Sodium: 136 mmol/L (ref 135–145)
Total Bilirubin: 0.6 mg/dL (ref 0.3–1.2)
Total Protein: 6.1 g/dL — ABNORMAL LOW (ref 6.5–8.1)

## 2019-12-29 LAB — TROPONIN I (HIGH SENSITIVITY)
Troponin I (High Sensitivity): 8 ng/L (ref <18)
Troponin I (High Sensitivity): 9 ng/L (ref <18)

## 2019-12-29 LAB — URINALYSIS, COMPLETE (UACMP) WITH MICROSCOPIC
Bilirubin Urine: NEGATIVE
Glucose, UA: NEGATIVE mg/dL
Hgb urine dipstick: NEGATIVE
Ketones, ur: NEGATIVE mg/dL
Nitrite: NEGATIVE
Protein, ur: 100 mg/dL — AB
Specific Gravity, Urine: 1.033 — ABNORMAL HIGH (ref 1.005–1.030)
WBC, UA: >50 WBC/hpf — ABNORMAL HIGH (ref 0–5)
pH: 5 (ref 5.0–8.0)

## 2019-12-29 LAB — CBC
HCT: 35.7 % — ABNORMAL LOW (ref 36.0–46.0)
Hemoglobin: 10.4 g/dL — ABNORMAL LOW (ref 12.0–15.0)
MCH: 21.1 pg — ABNORMAL LOW (ref 26.0–34.0)
MCHC: 29.1 g/dL — ABNORMAL LOW (ref 30.0–36.0)
MCV: 72.4 fL — ABNORMAL LOW (ref 80.0–100.0)
Platelets: 245 10*3/uL (ref 150–400)
RBC: 4.93 MIL/uL (ref 3.87–5.11)
RDW: 17.7 % — ABNORMAL HIGH (ref 11.5–15.5)
WBC: 10 10*3/uL (ref 4.0–10.5)
nRBC: 0 % (ref 0.0–0.2)

## 2019-12-29 LAB — LIPASE, BLOOD: Lipase: 15 U/L (ref 11–51)

## 2019-12-29 MED ORDER — FOSFOMYCIN TROMETHAMINE 3 G PO PACK
3.0000 g | PACK | Freq: Once | ORAL | Status: AC
  Administered 2019-12-29: 3 g via ORAL
  Filled 2019-12-29: qty 3

## 2019-12-29 MED ORDER — SODIUM CHLORIDE 0.9 % IV SOLN
1.0000 g | Freq: Once | INTRAVENOUS | Status: AC
  Administered 2019-12-29: 1 g via INTRAVENOUS
  Filled 2019-12-29: qty 10

## 2019-12-29 MED ORDER — SODIUM CHLORIDE 0.9 % IV BOLUS
500.0000 mL | Freq: Once | INTRAVENOUS | Status: AC
  Administered 2019-12-29: 500 mL via INTRAVENOUS

## 2019-12-29 MED ORDER — IOHEXOL 300 MG/ML  SOLN
100.0000 mL | Freq: Once | INTRAMUSCULAR | Status: AC | PRN
  Administered 2019-12-29: 100 mL via INTRAVENOUS

## 2019-12-29 MED ORDER — ONDANSETRON HCL 4 MG PO TABS: 4.0000 mg | ORAL_TABLET | Freq: Every day | ORAL | 0 refills | Status: AC | PRN

## 2019-12-29 MED ORDER — PROMETHAZINE HCL 25 MG/ML IJ SOLN
12.5000 mg | Freq: Four times a day (QID) | INTRAMUSCULAR | Status: DC | PRN
  Administered 2019-12-29: 12.5 mg via INTRAVENOUS
  Filled 2019-12-29: qty 1

## 2019-12-29 NOTE — ED Provider Notes (Signed)
CM/SW has seen and evaluated and plan to set up home health to start tomorrow. Son is agreeable and comfortable w/ this plan and discharge.    Lilia Pro., MD 12/29/19 [phone]

## 2019-12-29 NOTE — ED Triage Notes (Signed)
BIBA from home c/o N/V/D since last night. 32m Zofran IM given en route. Endorses 4/10 intermittent abd pain. Patient arrives awake alert in NAD on home 4L O2 via [place].

## 2019-12-29 NOTE — Progress Notes (Signed)
CSW spoke with patient's son, Julie Mcmillan, regarding patient discharging home with [facility] and his safety concerns about taking her home. Son discussed the patient needing to apply for Medicaid in order to be eligible for long-term care coverage. CSW provided son with contact information for [facility] [phone]) that assists older adults with care management and can at times assist with Medicaid applications.  Tobi Bastos, LCSW Transitions of Care Clinical Social Worker [facility] Emergency Department Ph: [phone]

## 2019-12-29 NOTE — Discharge Instructions (Addendum)
Thank you for letting us take care of you in the emergency department today.   Please continue to take any regular, prescribed medications.   Please follow up with: Your primary care doctor to review your ER visit and follow up on your symptoms.  Home Health, see information below.  Please return to the ER for any new or worsening symptoms.

## 2019-12-29 NOTE — ED Notes (Signed)
Spoke to SW/CM who will attempt to call patient and family around [place].

## 2019-12-29 NOTE — ED Notes (Signed)
Patient able to transfer from bed to [facility] with standby assist. Urine sample provided. Patient with moderate dyspnea from exertion but recovers well.   Off unit to CT via stretcher.

## 2019-12-29 NOTE — ED Provider Notes (Signed)
[facility] Emergency Department Provider Note    First MD Initiated Contact with Patient 12/29/19 1115     (approximate)  I have reviewed the triage vital signs and the nursing notes.   HISTORY  Chief Complaint Vomiting and Diarrhea    HPI Julie Mcmillan is a 79 y.o. female below listed past medical history presents the ER for epigastric discomfort nausea vomiting and diarrhea.  Does not notice any melena or hematochezia.   Denies any chest pain or shortness of breath.  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  She status post appendectomy and cholecystectomy.  Denies any back pain.  Is not been on any recent antibiotics.   Past Medical History:  Diagnosis Date  . A-fib ([place])   . Cancer ([place])    skin  . Chronic diarrhea   . Diverticulitis   . GERD (gastroesophageal reflux disease)    usually takes protonix  . Hip fracture ([place]) 11/2015   hairline fracture on right.  no surgery, just physical therapy  . Hypertension   . IBS (irritable bowel syndrome)   . IBS (irritable bowel syndrome)   . Interstitial lung disease ([place])    does not remember when  . Pulmonary fibrosis ([place])   . Pulmonary hypertension ([place])   . Seasonal affective disorder ([place])   . Shortness of breath dyspnea    with exertion  . Type 2 diabetes mellitus ([place]) 11/04/2014   pt states this has resolved with diet/exercise 11/07/18   Family History  Problem Relation Age of Onset  . Breast cancer Mother 74  . Diabetes Mellitus II Mother   . Hypothyroidism Mother   . Atrial fibrillation Mother   . Heart attack Father   . Diabetes Maternal Grandmother   . Heart attack Maternal Grandfather    Past Surgical History:  Procedure Laterality Date  . ABDOMINAL HYSTERECTOMY     partial  . APPENDECTOMY    . BREAST BIOPSY Left    core bx- neg  . CATARACT EXTRACTION W/PHACO Right 03/16/2015   Procedure: CATARACT EXTRACTION PHACO AND INTRAOCULAR LENS  PLACEMENT (IOC);  Surgeon: Leandrew Koyanagi, MD;  Location: [place];  Service: Ophthalmology;  Laterality: Right;  . CATARACT EXTRACTION W/PHACO Left 06/13/2016   Procedure: CATARACT EXTRACTION PHACO AND INTRAOCULAR LENS PLACEMENT (IOC);  Surgeon: Leandrew Koyanagi, MD;  Location: [place];  Service: Ophthalmology;  Laterality: Left;  pre-diabetic - diet controlled  . COLONOSCOPY    . COLONOSCOPY WITH PROPOFOL N/A 05/25/2016   Procedure: COLONOSCOPY WITH PROPOFOL;  Surgeon: Manya Silvas, MD;  Location: [facility] ENDOSCOPY;  Service: Endoscopy;  Laterality: N/A;  . ESOPHAGOGASTRODUODENOSCOPY (EGD) WITH PROPOFOL N/A 05/25/2016   Procedure: ESOPHAGOGASTRODUODENOSCOPY (EGD) WITH PROPOFOL;  Surgeon: Manya Silvas, MD;  Location: [facility] ENDOSCOPY;  Service: Endoscopy;  Laterality: N/A;  . NISSEN FUNDOPLICATION  7795   came apart in 2016 and ibs and reflux returned  . OPEN REDUCTION INTERNAL FIXATION (ORIF) DISTAL RADIAL FRACTURE Right 01/13/2016   Procedure: OPEN REDUCTION INTERNAL FIXATION (ORIF) DISTAL RADIAL FRACTURE;  Surgeon: Hessie Knows, MD;  Location: [facility] ORS;  Service: Orthopedics;  Laterality: Right;  . TONSILLECTOMY     and adenoidectomy   Patient Active Problem List   Diagnosis Date Noted  . Closed left hip fracture ([place]) 11/07/2018  . Enterovesical fistula   . Acute diverticulitis 08/12/2017  . Colonic diverticular abscess   . Dyspnea 10/27/2016  . Interstitial lung disease ([place]) 08/31/2016  . 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 DOE (dyspnea on exertion) 01/26/2016  . Acute respiratory failure with hypoxia ([place]) 11/29/2015  . Fracture of greater trochanter of right femur ([place]) 11/29/2015  . Fall 11/26/2015  . OP (osteoporosis) 07/18/2015  . 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 Adult hypothyroidism 07/18/2015  . Fatigue 07/18/2015  . Anxiety 07/18/2015  . Diverticulitis large intestine  07/04/2015  . Atrial fibrillation with rapid ventricular response ([place]) 03/25/2015  . PAF (paroxysmal atrial fibrillation) ([place]) 03/25/2015  . Acquired hypothyroidism 01/11/2015  . Anemia, iron deficiency 01/10/2015  . Type 2 diabetes mellitus ([place]) 11/04/2014  . 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 Essential (primary) hypertension 11/04/2014  . Temporary cerebral vascular dysfunction 04/08/2014  . Dry mouth 01/05/2013  . Cannot sleep 01/05/2013  . Obstructive apnea 09/02/2012  . Bursitis, ischial 05/19/2012  . GERD (gastroesophageal reflux disease) 02/01/2012  . DD (diverticular disease) 08/14/2011      Prior to Admission medications   Medication Sig Start Date End Date Taking? Authorizing Provider  acetaminophen (TYLENOL) 500 MG tablet Take 1,000 mg by mouth at bedtime.    [provider]  busPIRone (BUSPAR) 7.5 MG tablet Take 7.5 mg by mouth 2 (two) times daily. 10/22/18   [provider]  Calcium Carbonate-Vitamin D (CALCIUM 600+D) 600-200 MG-UNIT TABS Take 1 tablet by mouth daily.    [provider]  cholestyramine (QUESTRAN) 4 g packet Take 4 g by mouth daily before breakfast.  10/05/18   [provider]  diltiazem (CARDIZEM CD) 180 MG 24 hr capsule Take 180 mg by mouth daily. 10/04/18   [provider]  diltiazem (CARDIZEM) 30 MG tablet Take 30 mg by mouth as needed (palpitations for more than 30 minutes).    [provider]  FLUoxetine (PROZAC) 40 MG capsule Take 40 mg by mouth daily. 10/22/18   [provider]  furosemide (LASIX) 20 MG tablet Take 20 mg by mouth daily.    [provider]  ibandronate (BONIVA) 150 MG tablet Take 150 mg by mouth every 30 (thirty) days.  05/21/18   [provider]  levothyroxine (SYNTHROID, LEVOTHROID) 75 MCG tablet Take 75 mcg by mouth daily before breakfast.     [provider]  meclizine (ANTIVERT) 25 MG tablet Take 25 mg by mouth 3  (three) times daily as needed (vertigo).  03/27/18   [provider]  Multiple Vitamin (MULTIVITAMIN WITH MINERALS) TABS tablet Take 1 tablet by mouth daily.    [provider]  ondansetron (ZOFRAN) 4 MG tablet Take 4 mg by mouth every 8 (eight) hours as needed for nausea.  04/02/18   [provider]  pantoprazole (PROTONIX) 40 MG tablet Take 1 tablet (40 mg total) by mouth daily. 03/29/15   Glendon Axe, MD  predniSONE (DELTASONE) 10 MG tablet Take 10 mg by mouth daily with breakfast.    [provider]  sotalol (BETAPACE) 80 MG tablet Take 1 tablet (80 mg total) by mouth every 12 (twelve) hours. 03/29/15   Glendon Axe, MD  tadalafil, PAH, (ADCIRCA) 20 MG tablet Take 40 mg by mouth at bedtime.  03/25/17   [provider]  vitamin C (ASCORBIC ACID) 500 MG tablet Take 500 mg by mouth daily.    [provider]  zaleplon (SONATA) 5 MG capsule Take 5 mg by mouth at bedtime.  10/22/18   [provider]    Allergies Amlodipine, Levofloxacin, Nexium [esomeprazole magnesium],  and Aspartame    Social History Social History   Tobacco Use  . Smoking status: Never Smoker  . Smokeless tobacco: Never Used  . Tobacco comment: no passive smokers in home  Substance Use Topics  . Alcohol use: No    Alcohol/week: 0.0 standard drinks  . Drug use: No    Review of Systems Patient denies headaches, rhinorrhea, blurry vision, numbness, shortness of breath, chest pain, edema, cough, abdominal pain, nausea, vomiting, diarrhea, dysuria, fevers, rashes or hallucinations unless otherwise stated above in HPI. ____________________________________________   PHYSICAL EXAM:  VITAL SIGNS: Vitals:   12/29/19 1130 12/29/19 1330  BP: 112/80 (!) 163/65  Pulse: 79 76  Resp: (!) 21 16  Temp:    SpO2: 100% 100%    Constitutional: Alert and oriented.  Eyes: Conjunctivae are normal.  Head: Atraumatic. Nose: No congestion/rhinnorhea. Mouth/Throat:  Mucous membranes are moist.   Neck: No stridor. Painless ROM.  Cardiovascular: Normal rate, regular rhythm. Grossly normal heart sounds.  Good peripheral circulation. Respiratory: Normal respiratory effort.  No retractions. Lungs CTAB. Gastrointestinal: Soft and nontender. No distention. No abdominal bruits. No CVA tenderness. Musculoskeletal: No lower extremity tenderness nor edema.  No joint effusions. Neurologic:  Normal speech and language. No gross focal neurologic deficits are appreciated. No facial droop Skin:  Skin is warm, dry and intact. No rash noted. Psychiatric: Mood and affect are normal. Speech and behavior are normal.  ____________________________________________   LABS (all labs ordered are listed, but only abnormal results are displayed)  Results for orders placed or performed during the hospital encounter of 12/29/19 (from the past 24 hour(s))  Lipase, blood     Status: None   Collection Time: 12/29/19 11:29 AM  Result Value Ref Range   Lipase 15 11 - 51 U/L  Comprehensive metabolic panel     Status: Abnormal   Collection Time: 12/29/19 11:29 AM  Result Value Ref Range   Sodium 136 135 - 145 mmol/L   Potassium 4.4 3.5 - 5.1 mmol/L   Chloride 102 98 - 111 mmol/L   CO2 23 22 - 32 mmol/L   Glucose, Bld 246 (H) 70 - 99 mg/dL   BUN 20 8 - 23 mg/dL   Creatinine, Ser 1.01 (H) 0.44 - 1.00 mg/dL   Calcium 8.9 8.9 - 10.3 mg/dL   Total Protein 6.1 (L) 6.5 - 8.1 g/dL   Albumin 3.5 3.5 - 5.0 g/dL   AST 25 15 - 41 U/L   ALT 19 0 - 44 U/L   Alkaline Phosphatase 66 38 - 126 U/L   Total Bilirubin 0.6 0.3 - 1.2 mg/dL   GFR calc non Af Amer 53 (L) >60 mL/min   GFR calc Af Amer >60 >60 mL/min   Anion gap 11 5 - 15  CBC     Status: Abnormal   Collection Time: 12/29/19 11:29 AM  Result Value Ref Range   WBC 10.0 4.0 - 10.5 K/uL   RBC 4.93 3.87 - 5.11 MIL/uL   Hemoglobin 10.4 (L) 12.0 - 15.0 g/dL   HCT 35.7 (L) 36.0 - 46.0 %   MCV 72.4 (L) 80.0 - 100.0 fL   MCH 21.1 (L)  26.0 - 34.0 pg   MCHC 29.1 (L) 30.0 - 36.0 g/dL   RDW 17.7 (H) 11.5 - 15.5 %   Platelets 245 150 - 400 K/uL   nRBC 0.0 0.0 - 0.2 %  Troponin I (High Sensitivity)     Status: None   Collection Time: 12/29/19 11:29  AM  Result Value Ref Range   Troponin I (High Sensitivity) 8 <18 ng/L  Urinalysis, Complete w Microscopic     Status: Abnormal   Collection Time: 12/29/19 12:25 PM  Result Value Ref Range   Color, Urine AMBER (A) YELLOW   APPearance CLOUDY (A) CLEAR   Specific Gravity, Urine 1.033 (H) 1.005 - 1.030   pH 5.0 5.0 - 8.0   Glucose, UA NEGATIVE NEGATIVE mg/dL   Hgb urine dipstick NEGATIVE NEGATIVE   Bilirubin Urine NEGATIVE NEGATIVE   Ketones, ur NEGATIVE NEGATIVE mg/dL   Protein, ur 100 (A) NEGATIVE mg/dL   Nitrite NEGATIVE NEGATIVE   Leukocytes,Ua MODERATE (A) NEGATIVE   RBC / HPF 11-20 0 - 5 RBC/hpf   WBC, UA >50 (H) 0 - 5 WBC/hpf   Bacteria, UA FEW (A) NONE SEEN   Squamous Epithelial / LPF 0-5 0 - 5   Mucus PRESENT    Hyaline Casts, UA PRESENT    Granular Casts, UA PRESENT    Ca Oxalate Crys, UA PRESENT    ____________________________________________  EKG My review and personal interpretation at Time: 11:46   Indication: n/v  Rate: 80  Rhythm: sinus Axis: normal Other: nonspeicifc t wave abn, no stemi ____________________________________________  RADIOLOGY  I personally reviewed all radiographic images ordered to evaluate for the above acute complaints and reviewed radiology reports and findings.  These findings were personally discussed with the patient.  Please see medical record for radiology report.  ____________________________________________   PROCEDURES  Procedure(s) performed:  Procedures    Critical Care performed: no ____________________________________________   INITIAL IMPRESSION / ASSESSMENT AND PLAN / ED COURSE  Pertinent labs & imaging results that were available during my care of the patient were reviewed by me and considered in my  medical decision making (see chart for details).   DDX: diverticulitis, enteritis, gastritis, sbo, hernia,   KHILEY LIESER is a 79 y.o. who presents to the ED with nausea vomiting epigastric discomfort as well as diarrhea for the last 12 hours.  Denies any chest pain or pressure.  States she does have a history of chronic diarrhea.  Her abdominal exam is benign but given her previous history of multiple surgeries will order CT imaging to evaluate.  The patient will be placed on continuous pulse oximetry and telemetry for monitoring.  Laboratory evaluation will be sent to evaluate for the above complaints.     Clinical Course as of Dec 28 1517  Tue Dec 29, 2019  1334 Blood work is reassuring.  Urinalysis does show many white blood cells as well as bacteria.  Given her symptoms will cover for UTI.  She had similar appearing urine that grew out E. coli that was susceptible to Rocephin previously.  Will trial p.o.   [PR]  1450 Discussion with patient and family will trial fosfomycin she apparently has quite frequent issues with diarrhea when on antibiotics.   [PR]    Clinical Course User Index [PR] Merlyn Lot, MD   Family member brought up additional concerns about need for home health.  She remains hemodynamically stable.  She tolerated antibiotics.  Will prescribe antiemetic.  Will place consult for transition of care.  The patient was evaluated in Emergency Department today for the symptoms described in the history of present illness. He/she was evaluated in the context of the global COVID-19 pandemic, which necessitated consideration that the patient might be at risk for infection with the SARS-CoV-2 virus that causes COVID-19. Institutional protocols and algorithms that  pertain to the evaluation of patients at risk for COVID-19 are in a state of rapid change based on information released by regulatory bodies including the CDC and federal and state organizations. These policies and  algorithms were followed during the patient's care in the ED.  As part of my medical decision making, I reviewed the following data within the [facility] notes reviewed and incorporated, Labs reviewed, notes from prior ED visits and [place] Controlled Substance Database   ____________________________________________   FINAL CLINICAL IMPRESSION(S) / ED DIAGNOSES  Final diagnoses:  Nausea vomiting and diarrhea      NEW MEDICATIONS STARTED DURING THIS VISIT:  New Prescriptions   No medications on file     Note:  This document was prepared using Dragon voice recognition software and may include unintentional dictation errors.    Merlyn Lot, MD 12/29/19 [phone]

## 2019-12-29 NOTE — Progress Notes (Addendum)
CSW received a call from pt's [facility] ED CSW who was off-campus and asked the 2nd shift [facility] ED CSW who is on duty, to please request that the RN CM on-duty assist with [facility] needs as pt is up for D/C.  CSW called pt and spoke to pt's son [name] who was bedside with the pt.  Pt's son stated today his mother called him while he was at work and was asked by the pt to go to her home where he found her and called EMS to bring her to the hospital.  Pt's son states that there is no one to "watch her in the home" while he is at work and that he works 14 hours a day.  Pt receives a SS check and Medicaid.  CSW asked pt's son to confirm and pt's son stated pt does not have, "full Medicaid".  CSW counseled pt's son on SNF's, ALF's, how they work, who is qualified to go there, insurances that do and don't pay for them and how it is paid for otherwise.  CSW counseled pt's son on contacting DSS and how to request assistance with obtaining Medicaid for the pt.  Pt's son stated [facility] will be appreciated and stated he is agreeable to [place], was appreciative and thanked the CSW.  CSW stated he would alert the RN CM on duty of his choice for [facility] services and that [place] should be contacting pt's son Starlee Corralejo on 3/31 at his phone at ph: [phone] to arranged and assessment.  RN CM/RN/EPD updated and RN CM is arranging [facility] services now and pt is ok to D/C per RN CM.  CSW will continue to follow for D/C needs.  Alphonse Guild. Dorin Stooksbury  MSW, LCSW, LCAS, CSI Transitions of Care Clinical Social Worker Care Coordination Department Ph: [phone]

## 2019-12-29 NOTE — ED Notes (Signed)
SW/CM spoke to patient and son. Plan for [facility] set up to begin tomorrow. Son reports he feels ok to take patient home tonight. Patient agreeable to plan. MD notified.

## 2019-12-30 ENCOUNTER — Inpatient Hospital Stay
Admission: EM | Admit: 2019-12-30 | Discharge: 2020-01-15 | DRG: 392 | Disposition: A | Discharge status: Home Health | Payer: Medicare HMO | Attending: Internal Medicine | Admitting: Internal Medicine

## 2019-12-30 ENCOUNTER — Other Ambulatory Visit: Payer: Self-pay

## 2019-12-30 DIAGNOSIS — K529 Noninfective gastroenteritis and colitis, unspecified: Secondary | ICD-10-CM | POA: Diagnosis not present

## 2019-12-30 DIAGNOSIS — I152 Hypertension secondary to endocrine disorders: Secondary | ICD-10-CM | POA: Diagnosis present

## 2019-12-30 DIAGNOSIS — J8410 Pulmonary fibrosis, unspecified: Secondary | ICD-10-CM | POA: Diagnosis present

## 2019-12-30 DIAGNOSIS — Z79899 Other long term (current) drug therapy: Secondary | ICD-10-CM

## 2019-12-30 DIAGNOSIS — I959 Hypotension, unspecified: Secondary | ICD-10-CM | POA: Diagnosis not present

## 2019-12-30 DIAGNOSIS — E1169 Type 2 diabetes mellitus with other specified complication: Secondary | ICD-10-CM | POA: Diagnosis present

## 2019-12-30 DIAGNOSIS — Z85828 Personal history of other malignant neoplasm of skin: Secondary | ICD-10-CM

## 2019-12-30 DIAGNOSIS — E1159 Type 2 diabetes mellitus with other circulatory complications: Secondary | ICD-10-CM

## 2019-12-30 DIAGNOSIS — Z79890 Hormone replacement therapy (postmenopausal): Secondary | ICD-10-CM

## 2019-12-30 DIAGNOSIS — E8770 Fluid overload, unspecified: Secondary | ICD-10-CM | POA: Diagnosis not present

## 2019-12-30 DIAGNOSIS — J849 Interstitial pulmonary disease, unspecified: Secondary | ICD-10-CM | POA: Diagnosis present

## 2019-12-30 DIAGNOSIS — Z881 Allergy status to other antibiotic agents status: Secondary | ICD-10-CM

## 2019-12-30 DIAGNOSIS — E039 Hypothyroidism, unspecified: Secondary | ICD-10-CM | POA: Diagnosis present

## 2019-12-30 DIAGNOSIS — F419 Anxiety disorder, unspecified: Secondary | ICD-10-CM | POA: Diagnosis present

## 2019-12-30 DIAGNOSIS — E785 Hyperlipidemia, unspecified: Secondary | ICD-10-CM | POA: Diagnosis present

## 2019-12-30 DIAGNOSIS — Z803 Family history of malignant neoplasm of breast: Secondary | ICD-10-CM

## 2019-12-30 DIAGNOSIS — F329 Major depressive disorder, single episode, unspecified: Secondary | ICD-10-CM | POA: Diagnosis present

## 2019-12-30 DIAGNOSIS — Z9981 Dependence on supplemental oxygen: Secondary | ICD-10-CM

## 2019-12-30 DIAGNOSIS — D509 Iron deficiency anemia, unspecified: Secondary | ICD-10-CM | POA: Diagnosis present

## 2019-12-30 DIAGNOSIS — K219 Gastro-esophageal reflux disease without esophagitis: Secondary | ICD-10-CM | POA: Diagnosis present

## 2019-12-30 DIAGNOSIS — N3000 Acute cystitis without hematuria: Secondary | ICD-10-CM

## 2019-12-30 DIAGNOSIS — Z8249 Family history of ischemic heart disease and other diseases of the circulatory system: Secondary | ICD-10-CM

## 2019-12-30 DIAGNOSIS — N390 Urinary tract infection, site not specified: Secondary | ICD-10-CM | POA: Diagnosis present

## 2019-12-30 DIAGNOSIS — I4892 Unspecified atrial flutter: Secondary | ICD-10-CM | POA: Diagnosis not present

## 2019-12-30 DIAGNOSIS — Z888 Allergy status to other drugs, medicaments and biological substances status: Secondary | ICD-10-CM

## 2019-12-30 DIAGNOSIS — E119 Type 2 diabetes mellitus without complications: Secondary | ICD-10-CM

## 2019-12-30 DIAGNOSIS — Z833 Family history of diabetes mellitus: Secondary | ICD-10-CM

## 2019-12-30 DIAGNOSIS — K5730 Diverticulosis of large intestine without perforation or abscess without bleeding: Secondary | ICD-10-CM | POA: Diagnosis present

## 2019-12-30 DIAGNOSIS — I480 Paroxysmal atrial fibrillation: Secondary | ICD-10-CM | POA: Diagnosis present

## 2019-12-30 DIAGNOSIS — Z20822 Contact with and (suspected) exposure to covid-19: Secondary | ICD-10-CM | POA: Diagnosis present

## 2019-12-30 DIAGNOSIS — R0902 Hypoxemia: Secondary | ICD-10-CM

## 2019-12-30 DIAGNOSIS — I272 Pulmonary hypertension, unspecified: Secondary | ICD-10-CM | POA: Diagnosis present

## 2019-12-30 LAB — CBC WITH DIFFERENTIAL/PLATELET
Abs Immature Granulocytes: 0.12 10*3/uL — ABNORMAL HIGH (ref 0.00–0.07)
Basophils Absolute: 0 10*3/uL (ref 0.0–0.1)
Basophils Relative: 0 %
Eosinophils Absolute: 0.1 10*3/uL (ref 0.0–0.5)
Eosinophils Relative: 1 %
HCT: 32.6 % — ABNORMAL LOW (ref 36.0–46.0)
Hemoglobin: 9.3 g/dL — ABNORMAL LOW (ref 12.0–15.0)
Immature Granulocytes: 1 %
Lymphocytes Relative: 5 %
Lymphs Abs: 0.5 10*3/uL — ABNORMAL LOW (ref 0.7–4.0)
MCH: 20.7 pg — ABNORMAL LOW (ref 26.0–34.0)
MCHC: 28.5 g/dL — ABNORMAL LOW (ref 30.0–36.0)
MCV: 72.6 fL — ABNORMAL LOW (ref 80.0–100.0)
Monocytes Absolute: 1 10*3/uL (ref 0.1–1.0)
Monocytes Relative: 10 %
Neutro Abs: 9 10*3/uL — ABNORMAL HIGH (ref 1.7–7.7)
Neutrophils Relative %: 83 %
Platelets: 234 10*3/uL (ref 150–400)
RBC: 4.49 MIL/uL (ref 3.87–5.11)
RDW: 17.6 % — ABNORMAL HIGH (ref 11.5–15.5)
WBC: 10.7 10*3/uL — ABNORMAL HIGH (ref 4.0–10.5)
nRBC: 0 % (ref 0.0–0.2)

## 2019-12-30 LAB — COMPREHENSIVE METABOLIC PANEL
ALT: 17 U/L (ref 0–44)
AST: 17 U/L (ref 15–41)
Albumin: 3.5 g/dL (ref 3.5–5.0)
Alkaline Phosphatase: 60 U/L (ref 38–126)
Anion gap: 10 (ref 5–15)
BUN: 14 mg/dL (ref 8–23)
CO2: 21 mmol/L — ABNORMAL LOW (ref 22–32)
Calcium: 8.3 mg/dL — ABNORMAL LOW (ref 8.9–10.3)
Chloride: 107 mmol/L (ref 98–111)
Creatinine, Ser: 0.93 mg/dL (ref 0.44–1.00)
GFR calc Af Amer: >60 mL/min (ref >60)
GFR calc non Af Amer: 58 mL/min — ABNORMAL LOW (ref >60)
Glucose, Bld: 178 mg/dL — ABNORMAL HIGH (ref 70–99)
Potassium: 3.8 mmol/L (ref 3.5–5.1)
Sodium: 138 mmol/L (ref 135–145)
Total Bilirubin: 0.5 mg/dL (ref 0.3–1.2)
Total Protein: 5.9 g/dL — ABNORMAL LOW (ref 6.5–8.1)

## 2019-12-30 LAB — LIPASE, BLOOD: Lipase: 20 U/L (ref 11–51)

## 2019-12-30 MED ORDER — PREDNISONE 10 MG PO TABS
10.0000 mg | ORAL_TABLET | Freq: Every day | ORAL | Status: DC
  Administered 2019-12-31 – 2020-01-15 (×16): 10 mg via ORAL
  Filled 2019-12-30 (×16): qty 1

## 2019-12-30 MED ORDER — BUSPIRONE HCL 15 MG PO TABS
7.5000 mg | ORAL_TABLET | Freq: Two times a day (BID) | ORAL | Status: DC
  Administered 2019-12-31 – 2020-01-15 (×32): 7.5 mg via ORAL
  Filled 2019-12-30 (×20): qty 1
  Filled 2019-12-30: qty 2
  Filled 2019-12-30 (×8): qty 1
  Filled 2019-12-30: qty 2
  Filled 2019-12-30 (×3): qty 1

## 2019-12-30 MED ORDER — LEVOTHYROXINE SODIUM 50 MCG PO TABS
75.0000 ug | ORAL_TABLET | Freq: Every day | ORAL | Status: DC
  Administered 2019-12-31 – 2020-01-15 (×16): 75 ug via ORAL
  Filled 2019-12-30 (×4): qty 1
  Filled 2019-12-30: qty 2
  Filled 2019-12-30 (×12): qty 1

## 2019-12-30 MED ORDER — PROMETHAZINE HCL 25 MG PO TABS
12.5000 mg | ORAL_TABLET | Freq: Four times a day (QID) | ORAL | Status: DC | PRN
  Administered 2020-01-04 – 2020-01-13 (×2): 12.5 mg via ORAL
  Filled 2019-12-30 (×3): qty 1

## 2019-12-30 MED ORDER — SOTALOL HCL 80 MG PO TABS
80.0000 mg | ORAL_TABLET | Freq: Two times a day (BID) | ORAL | Status: DC
  Administered 2019-12-31 – 2020-01-04 (×10): 80 mg via ORAL
  Filled 2019-12-30 (×12): qty 1

## 2019-12-30 MED ORDER — PROMETHAZINE HCL 25 MG/ML IJ SOLN
12.5000 mg | Freq: Four times a day (QID) | INTRAMUSCULAR | Status: DC | PRN
  Administered 2019-12-31 – 2020-01-10 (×2): 12.5 mg via INTRAVENOUS
  Filled 2019-12-30 (×2): qty 1

## 2019-12-30 MED ORDER — ONDANSETRON HCL 4 MG/2ML IJ SOLN
4.0000 mg | Freq: Once | INTRAMUSCULAR | Status: AC
  Administered 2019-12-30: 4 mg via INTRAVENOUS
  Filled 2019-12-30: qty 2

## 2019-12-30 MED ORDER — POTASSIUM CHLORIDE IN NACL 20-0.9 MEQ/L-% IV SOLN
INTRAVENOUS | Status: AC
  Filled 2019-12-30: qty 1000

## 2019-12-30 MED ORDER — ACETAMINOPHEN 325 MG PO TABS
650.0000 mg | ORAL_TABLET | Freq: Four times a day (QID) | ORAL | Status: DC | PRN
  Administered 2019-12-31 – 2020-01-02 (×2): 650 mg via ORAL
  Filled 2019-12-30 (×2): qty 2

## 2019-12-30 MED ORDER — DILTIAZEM HCL ER COATED BEADS 180 MG PO CP24
180.0000 mg | ORAL_CAPSULE | Freq: Every day | ORAL | Status: DC
  Filled 2019-12-30: qty 1

## 2019-12-30 MED ORDER — TADALAFIL (PAH) 20 MG PO TABS
40.0000 mg | ORAL_TABLET | Freq: Every day | ORAL | Status: DC
  Administered 2019-12-31 – 2020-01-14 (×16): 40 mg via ORAL
  Filled 2019-12-30 (×18): qty 2

## 2019-12-30 MED ORDER — FLUOXETINE HCL 20 MG PO CAPS
40.0000 mg | ORAL_CAPSULE | Freq: Every day | ORAL | Status: DC
  Administered 2019-12-31 – 2020-01-15 (×16): 40 mg via ORAL
  Filled 2019-12-30 (×16): qty 2

## 2019-12-30 MED ORDER — PROMETHAZINE HCL 25 MG RE SUPP
12.5000 mg | Freq: Four times a day (QID) | RECTAL | Status: DC | PRN
  Filled 2019-12-30: qty 1

## 2019-12-30 MED ORDER — ACETAMINOPHEN 650 MG RE SUPP: 650.0000 mg | Freq: Four times a day (QID) | RECTAL | Status: DC | PRN

## 2019-12-30 MED ORDER — APIXABAN 5 MG PO TABS
5.0000 mg | ORAL_TABLET | Freq: Two times a day (BID) | ORAL | Status: DC
  Administered 2019-12-31 – 2020-01-15 (×32): 5 mg via ORAL
  Filled 2019-12-30 (×32): qty 1

## 2019-12-30 MED ORDER — SODIUM CHLORIDE 0.9 % IV SOLN: Freq: Once | INTRAVENOUS | Status: AC

## 2019-12-30 NOTE — ED Provider Notes (Signed)
[facility] Emergency Department Provider Note       Time seen: ----------------------------------------- 8:58 PM on 12/30/2019 -----------------------------------------   I have reviewed the triage vital signs and the nursing notes.  HISTORY   Chief Complaint Nausea, Emesis, and Diarrhea    HPI Julie Mcmillan is a 79 y.o. female with a history of atrial fibrillation, skin cancer, chronic diarrhea, diverticulitis, hip fracture, hypertension, IBS, interstitial lung disease, pulmonary fibrosis, diabetes who presents to the ED for nausea, vomiting and diarrhea.  Patient was here yesterday with similar complaints.  Reports Phenergan helped baths with the nausea.  She has a chronic UTI.  Discomfort is 3 out of 10 in the abdomen.  Past Medical History:  Diagnosis Date  . A-fib ([place])   . Cancer ([place])    skin  . Chronic diarrhea   . Diverticulitis   . GERD (gastroesophageal reflux disease)    usually takes protonix  . Hip fracture ([place]) 11/2015   hairline fracture on right.  no surgery, just physical therapy  . Hypertension   . IBS (irritable bowel syndrome)   . IBS (irritable bowel syndrome)   . Interstitial lung disease ([place])    does not remember when  . Pulmonary fibrosis ([place])   . Pulmonary hypertension ([place])   . Seasonal affective disorder ([place])   . Shortness of breath dyspnea    with exertion  . Type 2 diabetes mellitus ([place]) 11/04/2014   pt states this has resolved with diet/exercise 11/07/18    Patient Active Problem List   Diagnosis Date Noted  . Closed left hip fracture ([place]) 11/07/2018  . Enterovesical fistula   . Acute diverticulitis 08/12/2017  . Colonic diverticular abscess   . Dyspnea 10/27/2016  . Interstitial lung disease ([place]) 08/31/2016  . Depression, major, single episode, complete remission ([place]) 02/23/2016  . DOE (dyspnea on exertion) 01/26/2016  . Acute respiratory failure with hypoxia ([place]) 11/29/2015  . Fracture of greater  trochanter of right femur ([place]) 11/29/2015  . Fall 11/26/2015  . OP (osteoporosis) 07/18/2015  . 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 Adult hypothyroidism 07/18/2015  . Fatigue 07/18/2015  . Anxiety 07/18/2015  . Diverticulitis large intestine 07/04/2015  . Atrial fibrillation with rapid ventricular response ([place]) 03/25/2015  . PAF (paroxysmal atrial fibrillation) ([place]) 03/25/2015  . Acquired hypothyroidism 01/11/2015  . Anemia, iron deficiency 01/10/2015  . Type 2 diabetes mellitus ([place]) 11/04/2014  . 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 Essential (primary) hypertension 11/04/2014  . Temporary cerebral vascular dysfunction 04/08/2014  . Dry mouth 01/05/2013  . Cannot sleep 01/05/2013  . Obstructive apnea 09/02/2012  . Bursitis, ischial 05/19/2012  . GERD (gastroesophageal reflux disease) 02/01/2012  . DD (diverticular disease) 08/14/2011    Past Surgical History:  Procedure Laterality Date  . ABDOMINAL HYSTERECTOMY     partial  . APPENDECTOMY    . BREAST BIOPSY Left    core bx- neg  . CATARACT EXTRACTION W/PHACO Right 03/16/2015   Procedure: CATARACT EXTRACTION PHACO AND INTRAOCULAR LENS PLACEMENT (IOC);  Surgeon: Leandrew Koyanagi, MD;  Location: [facility];  Service: Ophthalmology;  Laterality: Right;  . CATARACT EXTRACTION W/PHACO Left 06/13/2016   Procedure: CATARACT EXTRACTION PHACO AND INTRAOCULAR LENS PLACEMENT (IOC);  Surgeon: Leandrew Koyanagi, MD;  Location: [facility];  Service: Ophthalmology;  Laterality: Left;  pre-diabetic - diet controlled  . COLONOSCOPY    . COLONOSCOPY WITH PROPOFOL N/A 05/25/2016   Procedure: COLONOSCOPY WITH PROPOFOL;  Surgeon: Manya Silvas, MD;  Location: [facility] ENDOSCOPY;  Service: Endoscopy;  Laterality: N/A;  . ESOPHAGOGASTRODUODENOSCOPY (EGD) WITH PROPOFOL N/A 05/25/2016   Procedure: ESOPHAGOGASTRODUODENOSCOPY (EGD) WITH PROPOFOL;  Surgeon: Manya Silvas, MD;   Location[facility] ENDOSCOPY;  Service: Endoscopy;  Laterality: N/A;  . NISSEN FUNDOPLICATION  9150   came apart in 2016 and ibs and reflux returned  . OPEN REDUCTION INTERNAL FIXATION (ORIF) DISTAL RADIAL FRACTURE Right 01/13/2016   Procedure: OPEN REDUCTION INTERNAL FIXATION (ORIF) DISTAL RADIAL FRACTURE;  Surgeon: Hessie Knows, MD;  Location: [facility] ORS;  Service: Orthopedics;  Laterality: Right;  . TONSILLECTOMY     and adenoidectomy    Allergies Amlodipine, Levofloxacin, Nexium [esomeprazole magnesium], and Aspartame  Social History Social History   Tobacco Use  . Smoking status: Never Smoker  . Smokeless tobacco: Never Used  . Tobacco comment: no passive smokers in home  Substance Use Topics  . Alcohol use: No    Alcohol/week: 0.0 standard drinks  . Drug use: No    Review of Systems Constitutional: Negative for fever. Cardiovascular: Negative for chest pain. Respiratory: Negative for shortness of breath. Gastrointestinal: Positive for abdominal pain, nausea, vomiting and diarrhea Musculoskeletal: Negative for back pain. Skin: Negative for rash. Neurological: Negative for headaches, focal weakness or numbness.  All systems negative/normal/unremarkable except as stated in the HPI  ____________________________________________   PHYSICAL EXAM:  VITAL SIGNS: ED Triage Vitals  Enc Vitals Group     BP --      Pulse Rate 12/30/19 2057 89     Resp 12/30/19 2057 14     Temp --      Temp src --      SpO2 12/30/19 2057 100 %     Weight 12/30/19 2053 145 lb (65.8 kg)     Height 12/30/19 2053 _0  (1.626 m)     Head Circumference --      Peak Flow --      Pain Score 12/30/19 2056 3     Pain Loc --      Pain Edu? --      Excl. in [place]? --     Constitutional: Alert and oriented. Well appearing and in no distress. Eyes: Conjunctivae are normal. Normal extraocular movements. Cardiovascular: Normal rate, regular rhythm. No murmurs, rubs, or gallops. Respiratory: Normal  respiratory effort without tachypnea nor retractions. Breath sounds are clear and equal bilaterally. No wheezes/rales/rhonchi. Gastrointestinal: Soft and nontender. Normal bowel sounds Musculoskeletal: Nontender with normal range of motion in extremities. No lower extremity tenderness nor edema. Neurologic:  Normal speech and language. No gross focal neurologic deficits are appreciated.  Skin:  Skin is warm, dry and intact. No rash noted. Psychiatric: Mood and affect are normal. Speech and behavior are normal.  ___________________________________________  ED COURSE:  As part of my medical decision making, I reviewed the following data within the [place] History obtained from family if available, nursing notes, old chart and ekg, as well as notes from prior ED visits. Patient presented for abdominal pain with vomiting and diarrhea, we will assess with labs and imaging as indicated at this time.   Procedures  Julie Mcmillan was evaluated in Emergency Department on 12/30/2019 for the symptoms described in the history of present illness. She was evaluated in the context of the global COVID-19 pandemic, which necessitated consideration that the patient might be at risk for infection with the SARS-CoV-2 virus that causes COVID-19. Institutional protocols and algorithms that pertain to the evaluation  of patients at risk for COVID-19 are in a state of rapid change based on information released by regulatory bodies including the CDC and federal and state organizations. These policies and algorithms were followed during the patient's care in the ED.  ____________________________________________   LABS (pertinent positives/negatives)  Labs Reviewed  CBC WITH DIFFERENTIAL/PLATELET - Abnormal; Notable for the following components:      Result Value   WBC 10.7 (*)    Hemoglobin 9.3 (*)    HCT 32.6 (*)    MCV 72.6 (*)    MCH 20.7 (*)    MCHC 28.5 (*)    RDW 17.6 (*)    Neutro Abs  9.0 (*)    Lymphs Abs 0.5 (*)    Abs Immature Granulocytes 0.12 (*)    All other components within normal limits  COMPREHENSIVE METABOLIC PANEL - Abnormal; Notable for the following components:   CO2 21 (*)    Glucose, Bld 178 (*)    Calcium 8.3 (*)    Total Protein 5.9 (*)    GFR calc non Af Amer 58 (*)    All other components within normal limits  C DIFFICILE QUICK SCREEN W PCR REFLEX  GI PATHOGEN PANEL BY PCR, STOOL  LIPASE, BLOOD  URINALYSIS, COMPLETE (UACMP) WITH MICROSCOPIC  OCCULT BLOOD X 1 CARD TO LAB, STOOL    RADIOLOGY CT yesterday was unremarkable ____________________________________________   DIFFERENTIAL DIAGNOSIS   Gastritis, gastroenteritis, dehydration, electrolyte abnormality, colitis  FINAL ASSESSMENT AND PLAN  Gastroenteritis   Plan: The patient had presented for nausea, vomiting and diarrhea. Patient's labs did not reveal any acute abnormality. Patient's imaging yesterday was reassuring.  This is her second visit in 2 days for the same complaint.  She was discharged home with adequate therapy.  It would be beneficial to admit her for observation, fluids and antiemetics.  I will give this time to perform stool cultures as well.  I will discuss with the hospitalist for admission.   Laurence Aly, MD    Note: This note was generated in part or whole with voice recognition software. Voice recognition is usually quite accurate but there are transcription errors that can and very often do occur. I apologize for any typographical errors that were not detected and corrected.     Earleen Newport, MD 12/30/19 [id number]

## 2019-12-30 NOTE — ED Triage Notes (Signed)
Pt here via [facility] from home c/o NVD. Pt was here yesterday with same complaints. Pt reports phenergan helped best with nausea.  Pt also has chronic UTI.   Pt on 4L [place] chronic, O2 07%, cbg 176, bp 153/70, HR 91. EKG Sinus rhythm.

## 2019-12-30 NOTE — H&P (Signed)
History and Physical    Julie Mcmillan HXT:[id number] DOB: Feb 28, 1941 DOA: 12/30/2019  PCP: Kirk Ruths, MD  Patient coming from: Home  I have personally briefly reviewed patient's old medical records in [place]  Chief Complaint: Nausea, vomiting, diarrhea  HPI: Julie Mcmillan is a 79 y.o. female with medical history significant for interstitial lung disease/pulmonary fibrosis, chronic respiratory failure with hypoxia on 4 L supplemental O2 via [place], paroxysmal atrial fibrillation on Eliquis, pulmonary hypertension, diet-controlled type 2 diabetes, hypertension, hypothyroidism, depression/anxiety who presents to the ED for evaluation of persistent nausea, vomiting, and diarrhea.  Patient reports being in her usual state of health until 2 days ago when she had new onset of nausea, vomiting, and watery diarrhea.  Emesis consists of food products.  She denies any hematemesis or hematochezia.  Symptoms have been persistent and she initially presented today ED yesterday (12/29/2019).  CT abdomen/pelvis showed diverticulosis without evidence of diverticulitis or other acute intra-abdominal or intrapelvic abnormalities.  Portable chest x-ray showed stable extensive fibrosis throughout the lungs.  Patient was given Zofran and treated for UTI empirically with IV ceftriaxone and fosfomycin.  At home, patient has been having continued frequent nausea with vomiting and watery diarrhea therefore she presented to the ED again for further evaluation.  She has been having associated lightheadedness/dizziness without fall or syncope.  She has been feeling generally weak with some mild lower abdominal/suprapubic discomfort.  She denies any dysuria.  She denies any subjective fevers, chills, diaphoresis, chest pain, palpitations.  She reports chronic intermittent shortness of breath due to her underlying lung disease which is unchanged.  She denies any recent sick contacts or change in diet.  ED  Course:  Initial vitals showed BP 119/61, pulse 87, RR 21, temp 99.7 Fahrenheit, SPO2 100% on 4 L supplemental O2 via [place].  Labs show WBC 10.7, hemoglobin 9.3, platelets 234,000, sodium 138, potassium 3.8, bicarb 21, BUN 14, creatinine 0.93, serum glucose 178, LFTs within normal limits, lipase 20.  GI pathogen and C. difficile stool studies were ordered and pending.  SARS-CoV-2 respiratory viral panel is ordered and pending.  Patient was given 1 L normal saline and the hospitalist service was consulted to admit for further evaluation and management.  Review of Systems: All systems reviewed and are negative except as documented in history of present illness above.   Past Medical History:  Diagnosis Date  . A-fib ([place])   . Cancer ([place])    skin  . Chronic diarrhea   . Diverticulitis   . GERD (gastroesophageal reflux disease)    usually takes protonix  . Hip fracture ([place]) 11/2015   hairline fracture on right.  no surgery, just physical therapy  . Hypertension   . IBS (irritable bowel syndrome)   . IBS (irritable bowel syndrome)   . Interstitial lung disease ([place])    does not remember when  . Pulmonary fibrosis ([place])   . Pulmonary hypertension ([place])   . Seasonal affective disorder ([place])   . Shortness of breath dyspnea    with exertion  . Type 2 diabetes mellitus ([place]) 11/04/2014   pt states this has resolved with diet/exercise 11/07/18    Past Surgical History:  Procedure Laterality Date  . ABDOMINAL HYSTERECTOMY     partial  . APPENDECTOMY    . BREAST BIOPSY Left    core bx- neg  . CATARACT EXTRACTION W/PHACO Right 03/16/2015   Procedure: CATARACT EXTRACTION PHACO AND INTRAOCULAR LENS PLACEMENT (IOC);  Surgeon: Leandrew Koyanagi,  MD;  Location: [facility];  Service: Ophthalmology;  Laterality: Right;  . CATARACT EXTRACTION W/PHACO Left 06/13/2016   Procedure: CATARACT EXTRACTION PHACO AND INTRAOCULAR LENS PLACEMENT (IOC);  Surgeon: Leandrew Koyanagi, MD;  Location:  [facility];  Service: Ophthalmology;  Laterality: Left;  pre-diabetic - diet controlled  . COLONOSCOPY    . COLONOSCOPY WITH PROPOFOL N/A 05/25/2016   Procedure: COLONOSCOPY WITH PROPOFOL;  Surgeon: Manya Silvas, MD;  [facility];  Service: Endoscopy;  Laterality: N/A;  . ESOPHAGOGASTRODUODENOSCOPY (EGD) WITH PROPOFOL N/A 05/25/2016   Procedure: ESOPHAGOGASTRODUODENOSCOPY (EGD) WITH PROPOFOL;  Surgeon: Manya Silvas, MD;  Location: [facility] ENDOSCOPY;  Service: Endoscopy;  Laterality: N/A;  . NISSEN FUNDOPLICATION  [id number]   came apart in 2016 and ibs and reflux returned  . OPEN REDUCTION INTERNAL FIXATION (ORIF) DISTAL RADIAL FRACTURE Right 01/13/2016   Procedure: OPEN REDUCTION INTERNAL FIXATION (ORIF) DISTAL RADIAL FRACTURE;  Surgeon: Hessie Knows, MD;  Location: [facility] ORS;  Service: Orthopedics;  Laterality: Right;  . TONSILLECTOMY     and adenoidectomy    Social History:  reports that she has never smoked. She has never used smokeless tobacco. She reports that she does not drink alcohol or use drugs.  Allergies  Allergen Reactions  . Amlodipine Swelling  . Levofloxacin Other (See Comments)    Other reaction(s): Other (See Comments) No flouroquinolones while patient on sotalol  Other reaction(s): Other (See Comments) No flouroquinolones while patient on sotalol.  Other reaction(s): Other (See Comments) No flouroquinolones while patient on sotalol   . Nexium [Esomeprazole Magnesium] Diarrhea  . Aspartame Other (See Comments)    Patient reports "self diagnosed intolerance" to artificial sweeteners.    Family History  Problem Relation Age of Onset  . Breast cancer Mother 43  . Diabetes Mellitus II Mother   . Hypothyroidism Mother   . Atrial fibrillation Mother   . Heart attack Father   . Diabetes Maternal Grandmother   . Heart attack Maternal Grandfather      Prior to Admission medications   Medication Sig Start Date End Date Taking? Authorizing  Provider  acetaminophen (TYLENOL) 500 MG tablet Take 1,000 mg by mouth at bedtime.    [provider]  busPIRone (BUSPAR) 7.5 MG tablet Take 7.5 mg by mouth 2 (two) times daily. 10/22/18   [provider]  Calcium Carbonate-Vitamin D (CALCIUM 600+D) 600-200 MG-UNIT TABS Take 1 tablet by mouth daily.    [provider]  cholestyramine (QUESTRAN) 4 g packet Take 4 g by mouth daily before breakfast.  10/05/18   [provider]  diltiazem (CARDIZEM CD) 180 MG 24 hr capsule Take 180 mg by mouth daily. 10/04/18   [provider]  diltiazem (CARDIZEM) 30 MG tablet Take 30 mg by mouth as needed (palpitations for more than 30 minutes).    [provider]  FLUoxetine (PROZAC) 40 MG capsule Take 40 mg by mouth daily. 10/22/18   [provider]  furosemide (LASIX) 20 MG tablet Take 20 mg by mouth daily.    [provider]  ibandronate (BONIVA) 150 MG tablet Take 150 mg by mouth every 30 (thirty) days.  05/21/18   [provider]  levothyroxine (SYNTHROID, LEVOTHROID) 75 MCG tablet Take 75 mcg by mouth daily before breakfast.     [provider]  meclizine (ANTIVERT) 25 MG tablet Take 25 mg by mouth 3 (three) times daily as needed (vertigo).  03/27/18   [provider]  Multiple Vitamin (MULTIVITAMIN WITH MINERALS)  TABS tablet Take 1 tablet by mouth daily.    [provider]  ondansetron (ZOFRAN) 4 MG tablet Take 4 mg by mouth every 8 (eight) hours as needed for nausea.  04/02/18   [provider]  ondansetron (ZOFRAN) 4 MG tablet Take 1 tablet (4 mg total) by mouth daily as needed. 12/29/19 12/28/20  Merlyn Lot, MD  pantoprazole (PROTONIX) 40 MG tablet Take 1 tablet (40 mg total) by mouth daily. 03/29/15   Glendon Axe, MD  predniSONE (DELTASONE) 10 MG tablet Take 10 mg by mouth daily with breakfast.    [provider]  sotalol (BETAPACE) 80 MG tablet Take 1 tablet (80 mg total) by mouth  every 12 (twelve) hours. 03/29/15   Glendon Axe, MD  tadalafil, PAH, (ADCIRCA) 20 MG tablet Take 40 mg by mouth at bedtime.  03/25/17   [provider]  vitamin C (ASCORBIC ACID) 500 MG tablet Take 500 mg by mouth daily.    [provider]  zaleplon (SONATA) 5 MG capsule Take 5 mg by mouth at bedtime.  10/22/18   [provider]    Physical Exam: Vitals:   12/30/19 2105 12/30/19 2200 12/30/19 2215 12/30/19 2230  BP: 136/64 119/61  127/64  Pulse:  88 85 89  Resp:  (!) _0 Temp: 99.7 F (37.6 C)     TempSrc: Oral     SpO2:  100% 100% 100%  Weight:      Height:       Constitutional: Resting supine in bed, NAD, calm, comfortable Eyes: PERRL, lids and conjunctivae normal ENMT: Mucous membranes are dry. Posterior pharynx clear of any exudate or lesions. Neck: normal, supple, no masses. Respiratory: Coarse inspiratory breath sounds with end inspiratory wheezing bilaterally.  Normal respiratory effort. No accessory muscle use.  Cardiovascular: Regular rate and rhythm, no murmurs / rubs / gallops. No extremity edema. 2+ pedal pulses. Abdomen: Mild lower abdominal/suprapubic tenderness, no masses palpated. No hepatosplenomegaly. Bowel sounds positive.  Musculoskeletal: no clubbing / cyanosis. No joint deformity upper and lower extremities. Good ROM, no contractures. Normal muscle tone.  Skin: no rashes, lesions, ulcers. No induration Neurologic: CN 2-12 grossly intact. Sensation intact, Strength 5/5 in all 4.  Psychiatric: Normal judgment and insight. Alert and oriented x 3. Normal mood.    Labs on Admission: I have personally reviewed following labs and imaging studies  CBC: Recent Labs  Lab 12/29/19 1129 12/30/19 2101  WBC 10.0 10.7*  NEUTROABS  --  9.0*  HGB 10.4* 9.3*  HCT 35.7* 32.6*  MCV 72.4* 72.6*  PLT 245 102   Basic Metabolic Panel: Recent Labs  Lab 12/29/19 1129 12/30/19 2101  NA 136 138  K 4.4 3.8  CL 102 107  CO2 23 21*    GLUCOSE 246* 178*  BUN 20 14  CREATININE 1.01* 0.93  CALCIUM 8.9 8.3*   GFR: Estimated Creatinine Clearance: 45.8 mL/min (by C-G formula based on SCr of 0.93 mg/dL). Liver Function Tests: Recent Labs  Lab 12/29/19 1129 12/30/19 2101  AST 25 17  ALT 19 17  ALKPHOS 66 60  BILITOT 0.6 0.5  PROT 6.1* 5.9*  ALBUMIN 3.5 3.5   Recent Labs  Lab 12/29/19 1129 12/30/19 2101  LIPASE 15 20   No results for input(s): AMMONIA in the last 168 hours. Coagulation Profile: No results for input(s): INR, PROTIME in the last 168 hours. Cardiac Enzymes: No results for input(s): CKTOTAL, CKMB, CKMBINDEX, TROPONINI in the last 168 hours. BNP (last  3 results) No results for input(s): PROBNP in the last 8760 hours. HbA1C: No results for input(s): HGBA1C in the last 72 hours. CBG: No results for input(s): GLUCAP in the last 168 hours. Lipid Profile: No results for input(s): CHOL, HDL, LDLCALC, TRIG, CHOLHDL, LDLDIRECT in the last 72 hours. Thyroid Function Tests: No results for input(s): TSH, T4TOTAL, FREET4, T3FREE, THYROIDAB in the last 72 hours. Anemia Panel: No results for input(s): VITAMINB12, FOLATE, FERRITIN, TIBC, IRON, RETICCTPCT in the last 72 hours. Urine analysis:    Component Value Date/Time   COLORURINE AMBER (A) 12/29/2019 1225   APPEARANCEUR CLOUDY (A) 12/29/2019 1225   APPEARANCEUR Clear 11/04/2018 1531   LABSPEC 1.033 (H) 12/29/2019 1225   PHURINE 5.0 12/29/2019 1225   GLUCOSEU NEGATIVE 12/29/2019 1225   HGBUR NEGATIVE 12/29/2019 1225   BILIRUBINUR NEGATIVE 12/29/2019 1225   BILIRUBINUR Negative 11/04/2018 1531   KETONESUR NEGATIVE 12/29/2019 1225   PROTEINUR 100 (A) 12/29/2019 1225   NITRITE NEGATIVE 12/29/2019 1225   LEUKOCYTESUR MODERATE (A) 12/29/2019 1225    Radiological Exams on Admission: CT ABDOMEN PELVIS W CONTRAST  Result Date: 12/29/2019 CLINICAL DATA:  Nausea, vomiting and diarrhea since last night, intermittent abdominal pain rated 4/10, history of  type II diabetes mellitus, atrial fibrillation, diverticulitis, GERD, hypertension, irritable bowel syndrome, pulmonary hypertension, pulmonary fibrosis EXAM: CT ABDOMEN AND PELVIS WITH CONTRAST TECHNIQUE: Multidetector CT imaging of the abdomen and pelvis was performed using the standard protocol following bolus administration of intravenous contrast. Sagittal and coronal MPR images reconstructed from axial data set. CONTRAST:  135m OMNIPAQUE IOHEXOL 300 MG/ML SOLN IV. No oral contrast. COMPARISON:  09/04/2019 FINDINGS: Lower chest: Bibasilar pulmonary fibrosis unchanged. Hepatobiliary: Contracted gallbladder. Liver unremarkable Pancreas: Hypoechoic nodule at body of pancreas, 8 x 12 mm, grossly unchanged. No additional pancreatic abnormalities. Spleen: Several tiny nonspecific low-attenuation foci within spleen, nonspecific. Adrenals/Urinary Tract: Adrenal glands normal appearance. Cortical scarring and small cysts within both kidneys. No enhancing renal mass, hydronephrosis or urinary tract calcification. Bladder and ureters unremarkable. Stomach/Bowel: Low lying cecum in pelvis. Appendix surgically absent by history. Distal colonic diverticulosis without evidence of diverticulitis. Moderate-sized hiatal hernia. Vascular/Lymphatic: Scattered atherosclerotic calcifications aorta and iliac arteries without aneurysm. Additional calcified plaques at the origins of the celiac and superior mesenteric arteries. No adenopathy. Reproductive: Uterus surgically absent with nonvisualization of ovaries Other: No free air or free fluid. No acute inflammatory process or hernia identified. Musculoskeletal: Osseous demineralization marked compression fracture of T8 vertebral body unchanged since another CT of 10/26/2016. IMPRESSION: Distal colonic diverticulosis without evidence of diverticulitis. Moderate-sized hiatal hernia. Stable cystic lesion at body of pancreas 8 x 12 x 14 mm. Stable bibasilar pulmonary fibrosis. Stable  marked compression fracture T8 vertebral body. No acute intra-abdominal or intrapelvic abnormalities. Aortic Atherosclerosis (ICD10-I70.0). Electronically Signed   By: MLavonia DanaM.D.   On: 12/29/2019 13:09   DG Chest Portable 1 View  Result Date: 12/29/2019 CLINICAL DATA:  Pain with nausea and vomiting EXAM: PORTABLE CHEST 1 VIEW COMPARISON:  August 10, 2019 FINDINGS: There is extensive fibrosis throughout the lungs bilaterally. There is no frank edema or consolidation. Heart is upper normal in size with pulmonary vascularity normal. No adenopathy. No bone lesions. There is aortic atherosclerosis. IMPRESSION: Extensive fibrosis throughout the lungs, stable. No edema or airspace opacity. Stable cardiac silhouette. No adenopathy. Aortic Atherosclerosis (ICD10-I70.0). Electronically Signed   By: WLowella GripIII M.D.   On: 12/29/2019 13:31    EKG: Not performed.  Assessment/Plan Principal Problem:   Gastroenteritis Active  Problems:   Type 2 diabetes mellitus (HCC)   Anemia, iron deficiency   Adult hypothyroidism   Hypertension associated with diabetes ([place])   PAF (paroxysmal atrial fibrillation) ([place])   Interstitial lung disease (H[facility])  AISLYNN CIFELLI is a 79 y.o. female with medical history significant for interstitial lung disease/pulmonary fibrosis, chronic respiratory failure with hypoxia on 4 L supplemental O2 via [place], paroxysmal atrial fibrillation on Eliquis, pulmonary hypertension, diet-controlled type 2 diabetes, hypertension, hypothyroidism, depression/anxiety who is admitted with gastroenteritis.  Gastroenteritis: With persistent nausea, vomiting, and diarrhea.  CT abdomen/pelvis with contrast on 12/29/2019 without acute etiology.  -Continue Phenergan as needed -Continue gentle IV fluid hydration overnight -Follow-up C. difficile, GI pathogen panel, SARS-CoV-2 PCR -Hold azathioprine, colestipol, Bactrim for now  Interstitial lung disease/pulmonary fibrosis with chronic  respiratory failure: Chronic and appears stable without any new respiratory symptoms. -Continue prednisone 10 mg daily -Holding azathioprine for now as above -Continue supplemental oxygen, maintaining well on home 4 L O2 via [place]  Paroxysmal atrial fibrillation: In sinus rhythm at time of admission. -Continue Eliquis -Continue diltiazem and sotalol  Microcytic anemia: Hemoglobin 9.3 on admission compared to baseline of 10.4-11.  No obvious bleeding.  Check anemia panel and continue to monitor.  Pulmonary hypertension: -Continue tadalafil, holding Lasix as she is hypovolemic on admission  Hypertension: Currently stable.  Continue diltiazem and sotalol as above.  Hypothyroidism: TSH 1.87 on 11/03/2019.  Continue Synthroid.  Diet-controlled type 2 diabetes: Continue to monitor.  Depression/anxiety: Continue fluoxetine and BuSpar.  DVT prophylaxis: Eliquis Code Status: Full code, confirmed with patient Family Communication: Discussed with patient, she has discussed with family Disposition Plan: From home, likely discharge to home pending symptomatic improvement and ability to maintain adequate oral intake Consults called: None Admission status: Observation   Zada Finders MD [facility]  If 7PM-7AM, please contact night-coverage [email or web address]  12/30/2019, 11:10 PM

## 2019-12-30 NOTE — ED Notes (Signed)
Report off to [facility] [employer]

## 2019-12-31 DIAGNOSIS — Z79899 Other long term (current) drug therapy: Secondary | ICD-10-CM | POA: Diagnosis not present

## 2019-12-31 DIAGNOSIS — Z85828 Personal history of other malignant neoplasm of skin: Secondary | ICD-10-CM | POA: Diagnosis not present

## 2019-12-31 DIAGNOSIS — E785 Hyperlipidemia, unspecified: Secondary | ICD-10-CM | POA: Diagnosis present

## 2019-12-31 DIAGNOSIS — N390 Urinary tract infection, site not specified: Secondary | ICD-10-CM | POA: Diagnosis present

## 2019-12-31 DIAGNOSIS — E119 Type 2 diabetes mellitus without complications: Secondary | ICD-10-CM | POA: Diagnosis not present

## 2019-12-31 DIAGNOSIS — I152 Hypertension secondary to endocrine disorders: Secondary | ICD-10-CM | POA: Diagnosis present

## 2019-12-31 DIAGNOSIS — K219 Gastro-esophageal reflux disease without esophagitis: Secondary | ICD-10-CM | POA: Diagnosis present

## 2019-12-31 DIAGNOSIS — J849 Interstitial pulmonary disease, unspecified: Secondary | ICD-10-CM | POA: Diagnosis not present

## 2019-12-31 DIAGNOSIS — E1169 Type 2 diabetes mellitus with other specified complication: Secondary | ICD-10-CM | POA: Diagnosis present

## 2019-12-31 DIAGNOSIS — I4892 Unspecified atrial flutter: Secondary | ICD-10-CM | POA: Diagnosis not present

## 2019-12-31 DIAGNOSIS — F329 Major depressive disorder, single episode, unspecified: Secondary | ICD-10-CM | POA: Diagnosis present

## 2019-12-31 DIAGNOSIS — Z8249 Family history of ischemic heart disease and other diseases of the circulatory system: Secondary | ICD-10-CM | POA: Diagnosis not present

## 2019-12-31 DIAGNOSIS — Z20822 Contact with and (suspected) exposure to covid-19: Secondary | ICD-10-CM | POA: Diagnosis present

## 2019-12-31 DIAGNOSIS — J8410 Pulmonary fibrosis, unspecified: Secondary | ICD-10-CM | POA: Diagnosis present

## 2019-12-31 DIAGNOSIS — I272 Pulmonary hypertension, unspecified: Secondary | ICD-10-CM | POA: Diagnosis present

## 2019-12-31 DIAGNOSIS — Z79890 Hormone replacement therapy (postmenopausal): Secondary | ICD-10-CM | POA: Diagnosis not present

## 2019-12-31 DIAGNOSIS — K5730 Diverticulosis of large intestine without perforation or abscess without bleeding: Secondary | ICD-10-CM | POA: Diagnosis present

## 2019-12-31 DIAGNOSIS — E8770 Fluid overload, unspecified: Secondary | ICD-10-CM | POA: Diagnosis not present

## 2019-12-31 DIAGNOSIS — Z803 Family history of malignant neoplasm of breast: Secondary | ICD-10-CM | POA: Diagnosis not present

## 2019-12-31 DIAGNOSIS — I959 Hypotension, unspecified: Secondary | ICD-10-CM | POA: Diagnosis not present

## 2019-12-31 DIAGNOSIS — Z833 Family history of diabetes mellitus: Secondary | ICD-10-CM | POA: Diagnosis not present

## 2019-12-31 DIAGNOSIS — E039 Hypothyroidism, unspecified: Secondary | ICD-10-CM | POA: Diagnosis present

## 2019-12-31 DIAGNOSIS — D509 Iron deficiency anemia, unspecified: Secondary | ICD-10-CM | POA: Diagnosis present

## 2019-12-31 DIAGNOSIS — K529 Noninfective gastroenteritis and colitis, unspecified: Secondary | ICD-10-CM | POA: Diagnosis present

## 2019-12-31 DIAGNOSIS — F419 Anxiety disorder, unspecified: Secondary | ICD-10-CM | POA: Diagnosis present

## 2019-12-31 DIAGNOSIS — I480 Paroxysmal atrial fibrillation: Secondary | ICD-10-CM | POA: Diagnosis present

## 2019-12-31 LAB — CBC
HCT: 26.9 % — ABNORMAL LOW (ref 36.0–46.0)
Hemoglobin: 7.9 g/dL — ABNORMAL LOW (ref 12.0–15.0)
MCH: 21.2 pg — ABNORMAL LOW (ref 26.0–34.0)
MCHC: 29.4 g/dL — ABNORMAL LOW (ref 30.0–36.0)
MCV: 72.1 fL — ABNORMAL LOW (ref 80.0–100.0)
Platelets: 183 10*3/uL (ref 150–400)
RBC: 3.73 MIL/uL — ABNORMAL LOW (ref 3.87–5.11)
RDW: 17.6 % — ABNORMAL HIGH (ref 11.5–15.5)
WBC: 6.6 10*3/uL (ref 4.0–10.5)
nRBC: 0 % (ref 0.0–0.2)

## 2019-12-31 LAB — BASIC METABOLIC PANEL
Anion gap: 5 (ref 5–15)
BUN: 11 mg/dL (ref 8–23)
CO2: 24 mmol/L (ref 22–32)
Calcium: 7.7 mg/dL — ABNORMAL LOW (ref 8.9–10.3)
Chloride: 110 mmol/L (ref 98–111)
Creatinine, Ser: 0.94 mg/dL (ref 0.44–1.00)
GFR calc Af Amer: >60 mL/min (ref >60)
GFR calc non Af Amer: 58 mL/min — ABNORMAL LOW (ref >60)
Glucose, Bld: 108 mg/dL — ABNORMAL HIGH (ref 70–99)
Potassium: 3.7 mmol/L (ref 3.5–5.1)
Sodium: 139 mmol/L (ref 135–145)

## 2019-12-31 LAB — C DIFFICILE QUICK SCREEN W PCR REFLEX
C Diff antigen: NEGATIVE
C Diff interpretation: NOT DETECTED
C Diff toxin: NEGATIVE

## 2019-12-31 LAB — FERRITIN: Ferritin: 9 ng/mL — ABNORMAL LOW (ref 11–307)

## 2019-12-31 LAB — URINALYSIS, COMPLETE (UACMP) WITH MICROSCOPIC
Bacteria, UA: NONE SEEN
Bilirubin Urine: NEGATIVE
Glucose, UA: NEGATIVE mg/dL
Hgb urine dipstick: NEGATIVE
Ketones, ur: NEGATIVE mg/dL
Nitrite: NEGATIVE
Protein, ur: 30 mg/dL — AB
Specific Gravity, Urine: 1.02 (ref 1.005–1.030)
WBC, UA: >50 WBC/hpf — ABNORMAL HIGH (ref 0–5)
pH: 5 (ref 5.0–8.0)

## 2019-12-31 LAB — IRON AND TIBC
Iron: 18 ug/dL — ABNORMAL LOW (ref 28–170)
Saturation Ratios: 4 % — ABNORMAL LOW (ref 10.4–31.8)
TIBC: 421 ug/dL (ref 250–450)
UIBC: 403 ug/dL

## 2019-12-31 LAB — MAGNESIUM: Magnesium: 1.8 mg/dL (ref 1.7–2.4)

## 2019-12-31 LAB — RESPIRATORY PANEL BY RT PCR (FLU A&B, COVID)
Influenza A by PCR: NEGATIVE
Influenza B by PCR: NEGATIVE
SARS Coronavirus 2 by RT PCR: NEGATIVE

## 2019-12-31 LAB — FOLATE: Folate: 10.2 ng/mL (ref >5.9)

## 2019-12-31 LAB — OCCULT BLOOD X 1 CARD TO LAB, STOOL: Fecal Occult Bld: NEGATIVE

## 2019-12-31 LAB — VITAMIN B12: Vitamin B-12: 217 pg/mL (ref 180–914)

## 2019-12-31 MED ORDER — DILTIAZEM LOAD VIA INFUSION
5.0000 mg | Freq: Once | INTRAVENOUS | Status: DC
  Filled 2019-12-31: qty 5

## 2019-12-31 MED ORDER — DILTIAZEM HCL-DEXTROSE 125-5 MG/125ML-% IV SOLN (PREMIX)
5.0000 mg/h | INTRAVENOUS | Status: DC
  Administered 2019-12-31: 5 mg/h via INTRAVENOUS
  Filled 2019-12-31: qty 125

## 2019-12-31 MED ORDER — SODIUM CHLORIDE 0.9% FLUSH
3.0000 mL | Freq: Two times a day (BID) | INTRAVENOUS | Status: DC
  Administered 2019-12-31 – 2020-01-15 (×26): 3 mL via INTRAVENOUS

## 2019-12-31 MED ORDER — DILTIAZEM HCL 25 MG/5ML IV SOLN
INTRAVENOUS | Status: AC
  Filled 2019-12-31: qty 5

## 2019-12-31 MED ORDER — DILTIAZEM LOAD VIA INFUSION: 10.0000 mg | Freq: Once | INTRAVENOUS | Status: DC

## 2019-12-31 MED ORDER — METOPROLOL TARTRATE 5 MG/5ML IV SOLN
5.0000 mg | Freq: Once | INTRAVENOUS | Status: AC
  Administered 2019-12-31: 5 mg via INTRAVENOUS
  Filled 2019-12-31: qty 5

## 2019-12-31 MED ORDER — DILTIAZEM HCL 30 MG PO TABS
30.0000 mg | ORAL_TABLET | Freq: Four times a day (QID) | ORAL | Status: DC
  Administered 2019-12-31 (×2): 30 mg via ORAL
  Filled 2019-12-31 (×3): qty 1

## 2019-12-31 MED ORDER — MAGNESIUM SULFATE 2 GM/50ML IV SOLN
2.0000 g | Freq: Once | INTRAVENOUS | Status: AC
  Administered 2019-12-31: 2 g via INTRAVENOUS
  Filled 2019-12-31: qty 50

## 2019-12-31 MED ORDER — DILTIAZEM HCL 25 MG/5ML IV SOLN: 10.0000 mg | Freq: Once | INTRAVENOUS | Status: DC

## 2019-12-31 NOTE — ED Notes (Signed)
ED TO INPATIENT HANDOFF REPORT  ED Nurse Name and Phone #: Bascom Levels Name/Age/Gender Julie Mcmillan 79 y.o. female Room/Bed: ED07A/ED07A  Code Status   Code Status: Full Code  Home/SNF/Other Home Patient oriented to: self, place, time and situation Is this baseline? Yes   Triage Complete: Triage complete  Chief Complaint Gastroenteritis [K52.9]  Triage Note Pt here via [facility] from home c/o NVD. Pt was here yesterday with same complaints. Pt reports phenergan helped best with nausea.  Pt also has chronic UTI.   Pt on 4L [place] chronic, O2 07%, cbg 176, bp 153/70, HR 91. EKG Sinus rhythm.     Allergies Allergies  Allergen Reactions  . Amlodipine Swelling  . Levofloxacin Other (See Comments)    Other reaction(s): Other (See Comments) No flouroquinolones while patient on sotalol  Other reaction(s): Other (See Comments) No flouroquinolones while patient on sotalol.  Other reaction(s): Other (See Comments) No flouroquinolones while patient on sotalol   . Nexium [Esomeprazole Magnesium] Diarrhea  . Aspartame Other (See Comments)    Patient reports "self diagnosed intolerance" to artificial sweeteners.    Level of Care/Admitting Diagnosis ED Disposition    ED Disposition Condition [facility] Area: [place] [[id number]]  Level of Care: Telemetry [5]  Covid Evaluation: Asymptomatic Screening Protocol (No Symptoms)  Diagnosis: Gastroenteritis [937902]  Admitting Physician: Lenore Cordia [[id number]]  Attending Physician: Lenore Cordia [[id number]]       B Medical/Surgery History Past Medical History:  Diagnosis Date  . A-fib ([place])   . Cancer ([place])    skin  . Chronic diarrhea   . Diverticulitis   . GERD (gastroesophageal reflux disease)    usually takes protonix  . Hip fracture ([place]) 11/2015   hairline fracture on right.  no surgery, just physical therapy  . Hypertension   . IBS (irritable bowel syndrome)   . IBS (irritable  bowel syndrome)   . Interstitial lung disease ([place])    does not remember when  . Pulmonary fibrosis ([place])   . Pulmonary hypertension ([place])   . Seasonal affective disorder ([place])   . Shortness of breath dyspnea    with exertion  . Type 2 diabetes mellitus ([place]) 11/04/2014   pt states this has resolved with diet/exercise 11/07/18   Past Surgical History:  Procedure Laterality Date  . ABDOMINAL HYSTERECTOMY     partial  . APPENDECTOMY    . BREAST BIOPSY Left    core bx- neg  . CATARACT EXTRACTION W/PHACO Right 03/16/2015   Procedure: CATARACT EXTRACTION PHACO AND INTRAOCULAR LENS PLACEMENT (IOC);  Surgeon: Leandrew Koyanagi, MD;  Location: [place];  Service: Ophthalmology;  Laterality: Right;  . CATARACT EXTRACTION W/PHACO Left 06/13/2016   Procedure: CATARACT EXTRACTION PHACO AND INTRAOCULAR LENS PLACEMENT (IOC);  Surgeon: Leandrew Koyanagi, MD;  Location: [place];  Service: Ophthalmology;  Laterality: Left;  pre-diabetic - diet controlled  . COLONOSCOPY    . COLONOSCOPY WITH PROPOFOL N/A 05/25/2016   Procedure: COLONOSCOPY WITH PROPOFOL;  Surgeon: Manya Silvas, MD;  Location: [facility] ENDOSCOPY;  Service: Endoscopy;  Laterality: N/A;  . ESOPHAGOGASTRODUODENOSCOPY (EGD) WITH PROPOFOL N/A 05/25/2016   Procedure: ESOPHAGOGASTRODUODENOSCOPY (EGD) WITH PROPOFOL;  Surgeon: Manya Silvas, MD;  Location[facility]COPY;  Service: Endoscopy;  Laterality: N/A;  . NISSEN FUNDOPLICATION  8341   came apart in 2016 and ibs and reflux returned  . OPEN REDUCTION INTERNAL FIXATION (ORIF) DISTAL RADIAL FRACTURE Right 01/13/2016   Procedure: OPEN  REDUCTION INTERNAL FIXATION (ORIF) DISTAL RADIAL FRACTURE;  Surgeon: Hessie Knows, MD;  Location: [facility];  Service: Orthopedics;  Laterality: Right;  . TONSILLECTOMY     and adenoidectomy     A IV Location/Drains/Wounds Patient Lines/Drains/Airways Status   Active Line/Drains/Airways    Name:   Placement date:   Placement time:    Site:   Days:   Peripheral IV 12/30/19 Left;Upper Arm   12/30/19    2101    Arm   1   External Urinary Catheter   12/31/19    0142    --   less than 1          Intake/Output Last 24 hours No intake or output data in the 24 hours ending 12/31/19 0344  Labs/Imaging Results for orders placed or performed during the hospital encounter of 12/30/19 (from the past 48 hour(s))  CBC with Differential     Status: Abnormal   Collection Time: 12/30/19  9:01 PM  Result Value Ref Range   WBC 10.7 (H) 4.0 - 10.5 K/uL   RBC 4.49 3.87 - 5.11 MIL/uL   Hemoglobin 9.3 (L) 12.0 - 15.0 g/dL   HCT 32.6 (L) 36.0 - 46.0 %   MCV 72.6 (L) 80.0 - 100.0 fL   MCH 20.7 (L) 26.0 - 34.0 pg   MCHC 28.5 (L) 30.0 - 36.0 g/dL   RDW 17.6 (H) 11.5 - 15.5 %   Platelets 234 150 - 400 K/uL   nRBC 0.0 0.0 - 0.2 %   Neutrophils Relative % 83 %   Neutro Abs 9.0 (H) 1.7 - 7.7 K/uL   Lymphocytes Relative 5 %   Lymphs Abs 0.5 (L) 0.7 - 4.0 K/uL   Monocytes Relative 10 %   Monocytes Absolute 1.0 0.1 - 1.0 K/uL   Eosinophils Relative 1 %   Eosinophils Absolute 0.1 0.0 - 0.5 K/uL   Basophils Relative 0 %   Basophils Absolute 0.0 0.0 - 0.1 K/uL   Immature Granulocytes 1 %   Abs Immature Granulocytes 0.12 (H) 0.00 - 0.07 K/uL    Comment: Performed at [facility], [address] [place], [place] [address]  Comprehensive metabolic panel     Status: Abnormal   Collection Time: 12/30/19  9:01 PM  Result Value Ref Range   Sodium 138 135 - 145 mmol/L   Potassium 3.8 3.5 - 5.1 mmol/L   Chloride 107 98 - 111 mmol/L   CO2 21 (L) 22 - 32 mmol/L   Glucose, Bld 178 (H) 70 - 99 mg/dL    Comment: Glucose reference range applies only to samples taken after fasting for at least 8 hours.   BUN 14 8 - 23 mg/dL   Creatinine, Ser 0.93 0.44 - 1.00 mg/dL   Calcium 8.3 (L) 8.9 - 10.3 mg/dL   Total Protein 5.9 (L) 6.5 - 8.1 g/dL   Albumin 3.5 3.5 - 5.0 g/dL   AST 17 15 - 41 U/L   ALT 17 0 - 44 U/L   Alkaline Phosphatase 60 38  - 126 U/L   Total Bilirubin 0.5 0.3 - 1.2 mg/dL   GFR calc non Af Amer 58 (L) >60 mL/min   GFR calc Af Amer >60 >60 mL/min   Anion gap 10 5 - 15    Comment: Performed at [facility], [address] [place], [place] [address]  Lipase, blood     Status: None   Collection Time: 12/30/19  9:01 PM  Result Value Ref  Range   Lipase 20 11 - 51 U/L    Comment: Performed at [facility], [address] [place], [place] [address]  Folate     Status: None   Collection Time: 12/30/19  9:01 PM  Result Value Ref Range   Folate 10.2 >5.9 ng/mL    Comment: Performed at [facility], [address] [place], [place] [address]  Iron and TIBC     Status: Abnormal   Collection Time: 12/30/19  9:01 PM  Result Value Ref Range   Iron 18 (L) 28 - 170 ug/dL   TIBC 421 250 - 450 ug/dL   Saturation Ratios 4 (L) 10.4 - 31.8 %   UIBC 403 ug/dL    Comment: Performed at [facility], [address], [place], [place] [address]  Ferritin     Status: Abnormal   Collection Time: 12/30/19  9:01 PM  Result Value Ref Range   Ferritin 9 (L) 11 - 307 ng/mL    Comment: Performed at [facility], [address], [place], [place] [address]  Respiratory Panel by RT PCR (Flu A&B, Covid) - Nasopharyngeal Swab     Status: None   Collection Time: 12/31/19 12:29 AM   Specimen: Nasopharyngeal Swab  Result Value Ref Range   SARS Coronavirus 2 by RT PCR NEGATIVE NEGATIVE    Comment: (NOTE) SARS-CoV-2 target nucleic acids are NOT DETECTED. The SARS-CoV-2 RNA is generally detectable in upper respiratoy specimens during the acute phase of infection. The lowest concentration of SARS-CoV-2 viral copies this assay can detect is 131 copies/mL. A negative result does not preclude SARS-Cov-2 infection and should not be used as the sole basis for treatment or other patient management decisions. A negative result may occur with  improper specimen collection/handling, submission of  specimen other than nasopharyngeal swab, presence of viral mutation(s) within the areas targeted by this assay, and inadequate number of viral copies (<131 copies/mL). A negative result must be combined with clinical observations, patient history, and epidemiological information. The expected result is Negative. Fact Sheet for Patients:  PinkCheek.be Fact Sheet for Healthcare Providers:  GravelBags.it This test is not yet ap proved or cleared by the [place] FDA and  has been authorized for detection and/or diagnosis of SARS-CoV-2 by FDA under an Emergency Use Authorization (EUA). This EUA will remain  in effect (meaning this test can be used) for the duration of the COVID-19 declaration under Section 564(b)(1) of the Act, 21 U.S.C. section 360bbb-3(b)(1), unless the authorization is terminated or revoked sooner.    Influenza A by PCR NEGATIVE NEGATIVE   Influenza B by PCR NEGATIVE NEGATIVE    Comment: (NOTE) The Xpert Xpress SARS-CoV-2/FLU/RSV assay is intended as an aid in  the diagnosis of influenza from Nasopharyngeal swab specimens and  should not be used as a sole basis for treatment. Nasal washings and  aspirates are unacceptable for Xpert Xpress SARS-CoV-2/FLU/RSV  testing. Fact Sheet for Patients: PinkCheek.be Fact Sheet for Healthcare Providers: GravelBags.it This test is not yet approved or cleared by the [place] FDA and  has been authorized for detection and/or diagnosis of SARS-CoV-2 by  FDA under an Emergency Use Authorization (EUA). This EUA will remain  in effect (meaning this test can be used) for the duration of the  Covid-19 declaration under Section 564(b)(1) of the Act, 21  U.S.C. section 360bbb-3(b)(1), unless the authorization is  terminated or revoked. Performed at [facility], [address], [place], [place] [address]  CT ABDOMEN PELVIS W CONTRAST  Result Date: 12/29/2019 CLINICAL DATA:  Nausea, vomiting and diarrhea since last night, intermittent abdominal pain rated 4/10, history of type II diabetes mellitus, atrial fibrillation, diverticulitis, GERD, hypertension, irritable bowel syndrome, pulmonary hypertension, pulmonary fibrosis EXAM: CT ABDOMEN AND PELVIS WITH CONTRAST TECHNIQUE: Multidetector CT imaging of the abdomen and pelvis was performed using the standard protocol following bolus administration of intravenous contrast. Sagittal and coronal MPR images reconstructed from axial data set. CONTRAST:  150m OMNIPAQUE IOHEXOL 300 MG/ML SOLN IV. No oral contrast. COMPARISON:  09/04/2019 FINDINGS: Lower chest: Bibasilar pulmonary fibrosis unchanged. Hepatobiliary: Contracted gallbladder. Liver unremarkable Pancreas: Hypoechoic nodule at body of pancreas, 8 x 12 mm, grossly unchanged. No additional pancreatic abnormalities. Spleen: Several tiny nonspecific low-attenuation foci within spleen, nonspecific. Adrenals/Urinary Tract: Adrenal glands normal appearance. Cortical scarring and small cysts within both kidneys. No enhancing renal mass, hydronephrosis or urinary tract calcification. Bladder and ureters unremarkable. Stomach/Bowel: Low lying cecum in pelvis. Appendix surgically absent by history. Distal colonic diverticulosis without evidence of diverticulitis. Moderate-sized hiatal hernia. Vascular/Lymphatic: Scattered atherosclerotic calcifications aorta and iliac arteries without aneurysm. Additional calcified plaques at the origins of the celiac and superior mesenteric arteries. No adenopathy. Reproductive: Uterus surgically absent with nonvisualization of ovaries Other: No free air or free fluid. No acute inflammatory process or hernia identified. Musculoskeletal: Osseous demineralization marked compression fracture of T8 vertebral body unchanged since another CT of 10/26/2016. IMPRESSION: Distal colonic  diverticulosis without evidence of diverticulitis. Moderate-sized hiatal hernia. Stable cystic lesion at body of pancreas 8 x 12 x 14 mm. Stable bibasilar pulmonary fibrosis. Stable marked compression fracture T8 vertebral body. No acute intra-abdominal or intrapelvic abnormalities. Aortic Atherosclerosis (ICD10-I70.0). Electronically Signed   By: MLavonia DanaM.D.   On: 12/29/2019 13:09   DG Chest Portable 1 View  Result Date: 12/29/2019 CLINICAL DATA:  Pain with nausea and vomiting EXAM: PORTABLE CHEST 1 VIEW COMPARISON:  August 10, 2019 FINDINGS: There is extensive fibrosis throughout the lungs bilaterally. There is no frank edema or consolidation. Heart is upper normal in size with pulmonary vascularity normal. No adenopathy. No bone lesions. There is aortic atherosclerosis. IMPRESSION: Extensive fibrosis throughout the lungs, stable. No edema or airspace opacity. Stable cardiac silhouette. No adenopathy. Aortic Atherosclerosis (ICD10-I70.0). Electronically Signed   By: WLowella GripIII M.D.   On: 12/29/2019 13:31    Pending Labs Unresulted Labs (From admission, onward)    Start     Ordered   12/31/19 0500  CBC  Tomorrow morning,   STAT     12/30/19 2353   12/31/19 09702 Basic metabolic panel  Tomorrow morning,   STAT     12/30/19 2353   12/31/19 0500  Magnesium  Tomorrow morning,   STAT     12/30/19 2353   12/30/19 2353  Vitamin B12  (Anemia Panel (PNL))  Add-on,   AD     12/30/19 2353   12/30/19 2159  Occult blood card to lab, stool  Once,   STAT     12/30/19 2159   12/30/19 2159  C Difficile Quick Screen w PCR reflex  (C Difficile quick screen w PCR reflex panel)  Once, for 24 hours,   STAT     12/30/19 2159   12/30/19 2159  GI pathogen panel by PCR, stool  (Gastrointestinal Panel by PCR, Stool                                                                                                                                                     *  Does Not include CLOSTRIDIUM DIFFICILE  testing.**If CDIFF testing is needed, select the C Difficile Quick Screen w PCR reflex order below)  Once,   STAT     12/30/19 2159   12/30/19 2058  Urinalysis, Complete w Microscopic  (Abdominal Pain)  ONCE - STAT,   STAT     12/30/19 2057          Vitals/Pain Today's Vitals   12/31/19 0245 12/31/19 0250 12/31/19 0310 12/31/19 0315  BP: (!) 101/49 (!) 100/47 (!) 116/56 (!) 107/58  Pulse: 78 75 81 75  Resp: (!) _0 Temp:      TempSrc:      SpO2: 100% 100% 99% 100%  Weight:      Height:      PainSc:        Isolation Precautions Enteric precautions (UV disinfection)  Medications Medications  0.9 % NaCl with KCl 20 mEq/ L  infusion ( Intravenous New Bag/Given 12/31/19 0135)  acetaminophen (TYLENOL) tablet 650 mg (has no administration in time range)    Or  acetaminophen (TYLENOL) suppository 650 mg (has no administration in time range)  promethazine (PHENERGAN) tablet 12.5 mg ( Oral See Alternative 12/31/19 0133)    Or  promethazine (PHENERGAN) injection 12.5 mg (12.5 mg Intravenous Given 12/31/19 0133)    Or  promethazine (PHENERGAN) suppository 12.5 mg ( Rectal See Alternative 12/31/19 0133)  busPIRone (BUSPAR) tablet 7.5 mg (7.5 mg Oral Given 12/31/19 0117)  diltiazem (CARDIZEM CD) 24 hr capsule 180 mg (has no administration in time range)  apixaban (ELIQUIS) tablet 5 mg (5 mg Oral Given 12/31/19 0117)  FLUoxetine (PROZAC) capsule 40 mg (has no administration in time range)  levothyroxine (SYNTHROID) tablet 75 mcg (has no administration in time range)  predniSONE (DELTASONE) tablet 10 mg (has no administration in time range)  sotalol (BETAPACE) tablet 80 mg (80 mg Oral Given 12/31/19 0117)  tadalafil (PAH) (ADCIRCA) tablet 40 mg (40 mg Oral Given 12/31/19 0117)  diltiazem (CARDIZEM) injection 10 mg (0 mg Intravenous Hold 12/31/19 0322)  ondansetron (ZOFRAN) injection 4 mg (4 mg Intravenous Given 12/30/19 2106)  0.9 %  sodium chloride infusion ( Intravenous Stopped 12/31/19 0136)   metoprolol tartrate (LOPRESSOR) injection 5 mg (5 mg Intravenous Given 12/31/19 0232)    Mobility walks with person assist Moderate fall risk   Focused Assessments Cardiac Assessment Handoff:    Lab Results  Component Value Date   CKTOTAL 137 11/25/2015   TROPONINI <0.03 02/21/2017   No results found for: DDIMER Does the Patient currently have chest pain? No     R Recommendations: See Admitting Provider Note  Report given to:   Additional Notes:

## 2019-12-31 NOTE — ED Notes (Signed)
Pt with 5 runs of VTACH. MD made aware. Pt resting. Pt stating felt little fluttering. Pt rhythm change to afib RVR with HR in 140s. MD made aware. No new orders at this time.

## 2019-12-31 NOTE — ED Notes (Signed)
Assisted patient to restroom, able to ambulate with assistance. Provided urine and stool sample. Patient maintained sinus rhythm with ambulation. Samples sent. Ginger ale and water provided to patient, warm blankets applied. No further requests

## 2019-12-31 NOTE — ED Notes (Signed)
Pt w/ 4 runs of VTACH. MD made aware. Pt still in afib with RVR.

## 2019-12-31 NOTE — Progress Notes (Signed)
Patient admitted to 2A 252 from ED via stretcher.  Patient on cardizem drip upon admission.  Oriented to room. No complaints at this time.

## 2019-12-31 NOTE — ED Notes (Signed)
Called lab to make sure occult card was being run due to noticeable drop in hgb

## 2019-12-31 NOTE — Consult Note (Signed)
Cardiology Consultation Note    Patient ID: Julie Mcmillan, MRN: [id number], DOB/AGE: August 29, 1941 79 y.o. Admit date: 12/30/2019   Date of Consult: 12/31/2019 Primary Physician: Kirk Ruths, MD Primary Cardiologist: Dorian Furnace, NP [facility]   Chief Complaint: abdominal pain/diarrhea Reason for Consultation: afib with rvr Requesting MD: Dr. Kurtis Bushman  HPI: JAKIYA BOOKBINDER is a 79 y.o. female with history of paroxysmal atrial fibrillation, pulmonary hypertension on tadalafil who presented to the emergency room with complaints of abdominal discomfort and increased diarrhea.  She has had a problem with this in the past.  She is being followed at [facility] for her paroxysmal A. fib and has been on sotalol 80 mg twice daily for this along with Eliquis for anticoagulation and diltiazem.  She states she has breakthrough A. fib 3-4 times a month.  She had atrial fibrillation episodes with rates of 144.  She denies chest pain or change in her breathing pattern.  She denies syncope or presyncope.  Laboratories showed a magnesium 1.8.  Potassium was 3.7.  She was placed back on her sotalol.  When she had breakthrough A. fib IV Cardizem was begun.  She is currently in sinus rhythm with good rate control.  Past Medical History:  Diagnosis Date  . A-fib ([place])   . Cancer ([place])    skin  . Chronic diarrhea   . Diverticulitis   . GERD (gastroesophageal reflux disease)    usually takes protonix  . Hip fracture ([place]) 11/2015   hairline fracture on right.  no surgery, just physical therapy  . Hypertension   . IBS (irritable bowel syndrome)   . IBS (irritable bowel syndrome)   . Interstitial lung disease ([place])    does not remember when  . Pulmonary fibrosis ([place])   . Pulmonary hypertension ([place])   . Seasonal affective disorder ([place])   . Shortness of breath dyspnea    with exertion  . Type 2 diabetes mellitus ([place]) 11/04/2014   pt states this has resolved with diet/exercise 11/07/18       Surgical History:  Past Surgical History:  Procedure Laterality Date  . ABDOMINAL HYSTERECTOMY     partial  . APPENDECTOMY    . BREAST BIOPSY Left    core bx- neg  . CATARACT EXTRACTION W/PHACO Right 03/16/2015   Procedure: CATARACT EXTRACTION PHACO AND INTRAOCULAR LENS PLACEMENT (IOC);  Surgeon: Leandrew Koyanagi, MD;  Location: [place];  Service: Ophthalmology;  Laterality: Right;  . CATARACT EXTRACTION W/PHACO Left 06/13/2016   Procedure: CATARACT EXTRACTION PHACO AND INTRAOCULAR LENS PLACEMENT (IOC);  Surgeon: Leandrew Koyanagi, MD;  Location: [place];  Service: Ophthalmology;  Laterality: Left;  pre-diabetic - diet controlled  . COLONOSCOPY    . COLONOSCOPY WITH PROPOFOL N/A 05/25/2016   Procedure: COLONOSCOPY WITH PROPOFOL;  Surgeon: Manya Silvas, MD;  Location[facility] ENDOSCOPY;  Service: Endoscopy;  Laterality: N/A;  . ESOPHAGOGASTRODUODENOSCOPY (EGD) WITH PROPOFOL N/A 05/25/2016   Procedure: ESOPHAGOGASTRODUODENOSCOPY (EGD) WITH PROPOFOL;  Surgeon: Manya Silvas, MD;  Location: [facility] ENDOSCOPY;  Service: Endoscopy;  Laterality: N/A;  . NISSEN FUNDOPLICATION  5208   came apart in 2016 and ibs and reflux returned  . OPEN REDUCTION INTERNAL FIXATION (ORIF) DISTAL RADIAL FRACTURE Right 01/13/2016   Procedure: OPEN REDUCTION INTERNAL FIXATION (ORIF) DISTAL RADIAL FRACTURE;  Surgeon: Hessie Knows, MD;  Location: [facility] ORS;  Service: Orthopedics;  Laterality: Right;  . TONSILLECTOMY     and adenoidectomy     Home  Meds: Prior to Admission medications   Medication Sig Start Date End Date Taking? Authorizing Provider  acetaminophen (TYLENOL) 500 MG tablet Take 1,000 mg by mouth at bedtime.   Yes [provider]  azaTHIOprine (IMURAN) 50 MG tablet Take 50 mg by mouth daily. 12/11/19  Yes [provider]  busPIRone (BUSPAR) 7.5 MG tablet Take 7.5 mg by mouth 2 (two) times daily. 10/22/18  Yes [provider]  Calcium  Carbonate-Vitamin D (CALCIUM 600+D) 600-200 MG-UNIT TABS Take 1 tablet by mouth daily.   Yes [provider]  colestipol (COLESTID) 1 g tablet Take 2 g by mouth 2 (two) times daily. 07/09/19  Yes [provider]  diltiazem (CARDIZEM CD) 180 MG 24 hr capsule Take 180 mg by mouth daily. 10/04/18  Yes [provider]  diltiazem (CARDIZEM) 30 MG tablet Take 30 mg by mouth as needed (palpitations for more than 30 minutes).   Yes [provider]  ELIQUIS 5 MG TABS tablet Take 5 mg by mouth 2 (two) times daily. 10/14/19  Yes [provider]  FLUoxetine (PROZAC) 40 MG capsule Take 40 mg by mouth daily. 10/22/18  Yes [provider]  furosemide (LASIX) 20 MG tablet Take 20 mg by mouth daily.   Yes [provider]  levothyroxine (SYNTHROID, LEVOTHROID) 75 MCG tablet Take 75 mcg by mouth daily before breakfast.    Yes [provider]  meclizine (ANTIVERT) 25 MG tablet Take 25 mg by mouth 3 (three) times daily as needed (vertigo).  03/27/18  Yes [provider]  methylPREDNISolone (MEDROL) 8 MG tablet TAKE 1 TABLET BY MOUTH ONCE DAILY FOR 180 DAYS. START AT CONCLUSION OF TAPER FROM 32 MG TO 16 MG (3 WEEK TAPER SEPARATE RX) 12/05/19  Yes [provider]  Multiple Vitamin (MULTIVITAMIN WITH MINERALS) TABS tablet Take 1 tablet by mouth daily.   Yes [provider]  ondansetron (ZOFRAN) 4 MG tablet Take 4 mg by mouth every 8 (eight) hours as needed for nausea.  04/02/18  Yes [provider]  ondansetron (ZOFRAN) 4 MG tablet Take 1 tablet (4 mg total) by mouth daily as needed. 12/29/19 12/28/20 Yes Merlyn Lot, MD  pantoprazole (PROTONIX) 40 MG tablet Take 1 tablet (40 mg total) by mouth daily. 03/29/15  Yes Glendon Axe, MD  predniSONE (DELTASONE) 10 MG tablet Take 10 mg by mouth daily with breakfast.   Yes [provider]  sotalol (BETAPACE) 80 MG tablet Take 1 tablet (80 mg total) by mouth every 12  (twelve) hours. 03/29/15  Yes Glendon Axe, MD  sulfamethoxazole-trimethoprim (BACTRIM) 400-80 MG tablet Take 1 tablet by mouth daily. 12/03/19  Yes [provider]  tadalafil, PAH, (ADCIRCA) 20 MG tablet Take 40 mg by mouth at bedtime.  03/25/17  Yes [provider]  vitamin C (ASCORBIC ACID) 500 MG tablet Take 500 mg by mouth daily.   Yes [provider]  zaleplon (SONATA) 5 MG capsule Take 5 mg by mouth at bedtime as needed for sleep.  10/22/18  Yes [provider]  cholestyramine (QUESTRAN) 4 g packet Take 4 g by mouth daily before breakfast.  10/05/18   [provider]  ibandronate (BONIVA) 150 MG tablet Take 150 mg by mouth every 30 (thirty) days.  05/21/18   [provider]    Inpatient Medications:  . apixaban  5 mg Oral BID  . busPIRone  7.5 mg Oral BID  . diltiazem  5 mg Intravenous Once  . FLUoxetine  40  mg Oral Daily  . levothyroxine  75 mcg Oral QAC breakfast  . predniSONE  10 mg Oral Q breakfast  . sotalol  80 mg Oral Q12H  . tadalafil (PAH)  40 mg Oral QHS   . diltiazem (CARDIZEM) infusion 5 mg/hr (12/31/19 1130)    Allergies:  Allergies  Allergen Reactions  . Amlodipine Swelling  . Levofloxacin Other (See Comments)    Other reaction(s): Other (See Comments) No flouroquinolones while patient on sotalol  Other reaction(s): Other (See Comments) No flouroquinolones while patient on sotalol.  Other reaction(s): Other (See Comments) No flouroquinolones while patient on sotalol   . Nexium [Esomeprazole Magnesium] Diarrhea  . Aspartame Other (See Comments)    Patient reports "self diagnosed intolerance" to artificial sweeteners.    Social History   Socioeconomic History  . Marital status: Divorced    Spouse name: Not on file  . Number of children: Not on file  . Years of education: Not on file  . Highest education level: Not on file  Occupational History  . Not on file  Tobacco Use  . Smoking status: Never Smoker   . Smokeless tobacco: Never Used  . Tobacco comment: no passive smokers in home  Substance and Sexual Activity  . Alcohol use: No    Alcohol/week: 0.0 standard drinks  . Drug use: No  . Sexual activity: Not Currently  Other Topics Concern  . Not on file  Social History Narrative  . Not on file   Social Determinants of Health   Financial Resource Strain:   . Difficulty of Paying Living Expenses:   Food Insecurity:   . Worried About Charity fundraiser in the Last Year:   . Arboriculturist in the Last Year:   Transportation Needs:   . Film/video editor (Medical):   Marland Kitchen Lack of Transportation (Non-Medical):   Physical Activity:   . Days of Exercise per Week:   . Minutes of Exercise per Session:   Stress:   . Feeling of Stress :   Social Connections:   . Frequency of Communication with Friends and Family:   . Frequency of Social Gatherings with Friends and Family:   . Attends Religious Services:   . Active Member of Clubs or Organizations:   . Attends Archivist Meetings:   Marland Kitchen Marital Status:   Intimate Partner Violence:   . Fear of Current or Ex-Partner:   . Emotionally Abused:   Marland Kitchen Physically Abused:   . Sexually Abused:      Family History  Problem Relation Age of Onset  . Breast cancer Mother 25  . Diabetes Mellitus II Mother   . Hypothyroidism Mother   . Atrial fibrillation Mother   . Heart attack Father   . Diabetes Maternal Grandmother   . Heart attack Maternal Grandfather      Review of Systems: A 12-system review of systems was performed and is negative except as noted in the HPI.  Labs: No results for input(s): CKTOTAL, CKMB, TROPONINI in the last 72 hours. Lab Results  Component Value Date   WBC 6.6 12/31/2019   HGB 7.9 (L) 12/31/2019   HCT 26.9 (L) 12/31/2019   MCV 72.1 (L) 12/31/2019   PLT 183 12/31/2019    Recent Labs  Lab 12/30/19 2101 12/30/19 2101 12/31/19 0454  NA 138   < > 139  K 3.8   < > 3.7  CL 107   < > 110  CO2  21*   < >  24  BUN 14   < > 11  CREATININE 0.93   < > 0.94  CALCIUM 8.3*   < > 7.7*  PROT 5.9*  --   --   BILITOT 0.5  --   --   ALKPHOS 60  --   --   ALT 17  --   --   AST 17  --   --   GLUCOSE 178*   < > 108*   < > = values in this interval not displayed.   No results found for: CHOL, HDL, LDLCALC, TRIG No results found for: DDIMER  Radiology/Studies:  CT ABDOMEN PELVIS W CONTRAST  Result Date: 12/29/2019 CLINICAL DATA:  Nausea, vomiting and diarrhea since last night, intermittent abdominal pain rated 4/10, history of type II diabetes mellitus, atrial fibrillation, diverticulitis, GERD, hypertension, irritable bowel syndrome, pulmonary hypertension, pulmonary fibrosis EXAM: CT ABDOMEN AND PELVIS WITH CONTRAST TECHNIQUE: Multidetector CT imaging of the abdomen and pelvis was performed using the standard protocol following bolus administration of intravenous contrast. Sagittal and coronal MPR images reconstructed from axial data set. CONTRAST:  12m OMNIPAQUE IOHEXOL 300 MG/ML SOLN IV. No oral contrast. COMPARISON:  09/04/2019 FINDINGS: Lower chest: Bibasilar pulmonary fibrosis unchanged. Hepatobiliary: Contracted gallbladder. Liver unremarkable Pancreas: Hypoechoic nodule at body of pancreas, 8 x 12 mm, grossly unchanged. No additional pancreatic abnormalities. Spleen: Several tiny nonspecific low-attenuation foci within spleen, nonspecific. Adrenals/Urinary Tract: Adrenal glands normal appearance. Cortical scarring and small cysts within both kidneys. No enhancing renal mass, hydronephrosis or urinary tract calcification. Bladder and ureters unremarkable. Stomach/Bowel: Low lying cecum in pelvis. Appendix surgically absent by history. Distal colonic diverticulosis without evidence of diverticulitis. Moderate-sized hiatal hernia. Vascular/Lymphatic: Scattered atherosclerotic calcifications aorta and iliac arteries without aneurysm. Additional calcified plaques at the origins of the celiac and  superior mesenteric arteries. No adenopathy. Reproductive: Uterus surgically absent with nonvisualization of ovaries Other: No free air or free fluid. No acute inflammatory process or hernia identified. Musculoskeletal: Osseous demineralization marked compression fracture of T8 vertebral body unchanged since another CT of 10/26/2016. IMPRESSION: Distal colonic diverticulosis without evidence of diverticulitis. Moderate-sized hiatal hernia. Stable cystic lesion at body of pancreas 8 x 12 x 14 mm. Stable bibasilar pulmonary fibrosis. Stable marked compression fracture T8 vertebral body. No acute intra-abdominal or intrapelvic abnormalities. Aortic Atherosclerosis (ICD10-I70.0). Electronically Signed   By: MLavonia DanaM.D.   On: 12/29/2019 13:09   DG Chest Portable 1 View  Result Date: 12/29/2019 CLINICAL DATA:  Pain with nausea and vomiting EXAM: PORTABLE CHEST 1 VIEW COMPARISON:  August 10, 2019 FINDINGS: There is extensive fibrosis throughout the lungs bilaterally. There is no frank edema or consolidation. Heart is upper normal in size with pulmonary vascularity normal. No adenopathy. No bone lesions. There is aortic atherosclerosis. IMPRESSION: Extensive fibrosis throughout the lungs, stable. No edema or airspace opacity. Stable cardiac silhouette. No adenopathy. Aortic Atherosclerosis (ICD10-I70.0). Electronically Signed   By: WLowella GripIII M.D.   On: 12/29/2019 13:31    Wt Readings from Last 3 Encounters:  12/30/19 65.8 kg  12/29/19 65.8 kg  09/04/19 68 kg    EKG: As per above  Physical Exam: No acute distress Blood pressure (!) 116/49, pulse 79, temperature 98.8 F (37.1 C), temperature source Oral, resp. rate (!) 22, height _0  (1.626 m), weight 65.8 kg, SpO2 100 %. Body mass index is 24.89 kg/m. General: Well developed, well nourished, in no acute distress. Head: Normocephalic, atraumatic, sclera non-icteric, no xanthomas, nares are without discharge.  Neck:  Negative for carotid  bruits. JVD not elevated. Lungs: Clear bilaterally to auscultation without wheezes, rales, or rhonchi. Breathing is unlabored. Heart: RRR with S1 S2. No murmurs, rubs, or gallops appreciated. Abdomen: Soft, non-tender, non-distended with normoactive bowel sounds. No hepatomegaly. No rebound/guarding. No obvious abdominal masses. Msk:  Strength and tone appear normal for age. Extremities: No clubbing or cyanosis. No edema.  Distal pedal pulses are 2+ and equal bilaterally. Neuro: Alert and oriented X 3. No facial asymmetry. No focal deficit. Moves all extremities spontaneously. Psych:  Responds to questions appropriately with a normal affect.     Assessment and Plan  79 year old female with history of paroxysmal atrial fibrillation and pulmonary hypertension on tadalafil.  Her A. fib is treated with sotalol 80 twice daily and Cardizem.  She is on Eliquis for anticoagulation.  Her QTC on presenting EKG was less than 500.  She has had several runs of atrial fibrillation with rapid ventricular response.  She is currently hemodynamically stable.  1.  Atrial fibrillation  -We will continue with sotalol 80 twice daily.  After maintaining sinus rhythm for some time we will recheck her QTC.  If not prolonged will consider increasing to 120 mg twice daily.  We will continue with Cardizem for now we will attempt to wean her Cardizem drip as her GI status stabilizes.  We will replete her magnesium with 2 g of IV magnesium.  Would recommend potassium greater than 4.  2.  Pulmonary hypertension  -Continue with tadalafil.  Continue with supplemental oxygen.  3.  Nausea and vomiting with diarrhea  -Work-up pending progress.  Signed, Teodoro Spray MD 12/31/2019, 11:58 AM Pager: [phone]

## 2019-12-31 NOTE — ED Notes (Signed)
0224- this RN noticed patient's HR had jumped up to 140s sustained, EKG leads applied and EKG taken. Patient denies chest pain but does endorse feeling like her heart is racing. BP 100/47. EDP notified, admitting paged  0230- patient in and out of rapid tachycardia/afib. Metoprolol given as ordered by EDP. Admitting at bedside to evaluate  0237- pt now in afib RVR, cardizem ordered  0238- pt converted back to sinus rhythm in 70s, maintaining  0255- patient continues to stay in NSR, diltiazem push held at this time

## 2019-12-31 NOTE — ED Notes (Signed)
Pt back in NSR with HR 73.

## 2019-12-31 NOTE — ED Notes (Signed)
Placed patient on bedpan to try to provide urine sample

## 2019-12-31 NOTE — ED Notes (Signed)
Patient does not have to urinate or have bowel movement at this time, unable to collect specimens

## 2019-12-31 NOTE — ED Notes (Signed)
Fecal occult negative

## 2019-12-31 NOTE — ED Notes (Signed)
Pt had 5 runs of VTACH. Pt was sleeping with no complaints. Pt now in afib rhythm with HR between 110-140s. Pt was sleeping. MD made aware.

## 2019-12-31 NOTE — Progress Notes (Addendum)
PROGRESS NOTE    Julie Mcmillan  GRM:[id number] DOB: 1941-08-27 DOA: 12/30/2019 PCP: Kirk Ruths, MD    Brief Narrative:  Julie Mcmillan is a 79 y.o. female with history of paroxysmal atrial fibrillation, pulmonary hypertension on tadalafil who presented to the emergency room with complaints of abdominal discomfort and increased diarrhea.  Also while in the ED had A. fib RVR intermittently on Cardizem and nonsustained VT.    Consultants:   Cardiology  Procedures: CT and echo  Antimicrobials:      Subjective: Feels little bit better.  Trying to eat.  Still with diarrhea.  Telemetry with sinus rhythm  Objective: Vitals:   12/31/19 1300 12/31/19 1400 12/31/19 1430 12/31/19 1500  BP: (!) 118/55 (!) 112/53 (!) 122/45 (!) 130/57  Pulse: 73 73 66 71  Resp: 20 18 (!) 25 19  Temp:      TempSrc:      SpO2: 100% 100% 100% 100%  Weight:      Height:       No intake or output data in the 24 hours ending 12/31/19 1524 Filed Weights   12/30/19 2053  Weight: 65.8 kg    Examination:  General exam: Appears calm and comfortable  Respiratory system: Clear to auscultation. Respiratory effort normal. Cardiovascular system: S1 & S2 heard, RRR. No JVD, murmurs, rubs, gallops or clicks. No pedal edema. Gastrointestinal system: Abdomen is nondistended, soft and nontender. No organomegaly or masses felt. Normal bowel sounds heard. Central nervous system: Alert and oriented. No focal neurological deficits. Extremities: Symmetric 5 x 5 power. Skin: No rashes, lesions or ulcers Psychiatry: Judgement and insight appear normal. Mood & affect appropriate.     Data Reviewed: I have personally reviewed following labs and imaging studies  CBC: Recent Labs  Lab 12/29/19 1129 12/30/19 2101 12/31/19 0454  WBC 10.0 10.7* 6.6  NEUTROABS  --  9.0*  --   HGB 10.4* 9.3* 7.9*  HCT 35.7* 32.6* 26.9*  MCV 72.4* 72.6* 72.1*  PLT 245 234 493   Basic Metabolic Panel: Recent Labs    Lab 12/29/19 1129 12/30/19 2101 12/31/19 0454  NA 136 138 139  K 4.4 3.8 3.7  CL 102 107 110  CO2 23 21* 24  GLUCOSE 246* 178* 108*  BUN _0 CREATININE 1.01* 0.93 0.94  CALCIUM 8.9 8.3* 7.7*  MG  --   --  1.8   GFR: Estimated Creatinine Clearance: 45.3 mL/min (by C-G formula based on SCr of 0.94 mg/dL). Liver Function Tests: Recent Labs  Lab 12/29/19 1129 12/30/19 2101  AST 25 17  ALT 19 17  ALKPHOS 66 60  BILITOT 0.6 0.5  PROT 6.1* 5.9*  ALBUMIN 3.5 3.5   Recent Labs  Lab 12/29/19 1129 12/30/19 2101  LIPASE 15 20   No results for input(s): AMMONIA in the last 168 hours. Coagulation Profile: No results for input(s): INR, PROTIME in the last 168 hours. Cardiac Enzymes: No results for input(s): CKTOTAL, CKMB, CKMBINDEX, TROPONINI in the last 168 hours. BNP (last 3 results) No results for input(s): PROBNP in the last 8760 hours. HbA1C: No results for input(s): HGBA1C in the last 72 hours. CBG: No results for input(s): GLUCAP in the last 168 hours. Lipid Profile: No results for input(s): CHOL, HDL, LDLCALC, TRIG, CHOLHDL, LDLDIRECT in the last 72 hours. Thyroid Function Tests: No results for input(s): TSH, T4TOTAL, FREET4, T3FREE, THYROIDAB in the last 72 hours. Anemia Panel: Recent Labs    12/30/19 2101  FOLATE  10.2  FERRITIN 9*  TIBC 421  IRON 18*   Sepsis Labs: No results for input(s): PROCALCITON, LATICACIDVEN in the last 168 hours.  Recent Results (from the past 240 hour(s))  C Difficile Quick Screen w PCR reflex     Status: None   Collection Time: 12/31/19 12:29 AM   Specimen: Stool  Result Value Ref Range Status   C Diff antigen NEGATIVE NEGATIVE Final   C Diff toxin NEGATIVE NEGATIVE Final   C Diff interpretation No C. difficile detected.  Final    Comment: Performed at [facility], [address] [place], [place] [address]  Respiratory Panel by RT PCR (Flu A&B, Covid) - Nasopharyngeal Swab     Status: None   Collection  Time: 12/31/19 12:29 AM   Specimen: Nasopharyngeal Swab  Result Value Ref Range Status   SARS Coronavirus 2 by RT PCR NEGATIVE NEGATIVE Final    Comment: (NOTE) SARS-CoV-2 target nucleic acids are NOT DETECTED. The SARS-CoV-2 RNA is generally detectable in upper respiratoy specimens during the acute phase of infection. The lowest concentration of SARS-CoV-2 viral copies this assay can detect is 131 copies/mL. A negative result does not preclude SARS-Cov-2 infection and should not be used as the sole basis for treatment or other patient management decisions. A negative result may occur with  improper specimen collection/handling, submission of specimen other than nasopharyngeal swab, presence of viral mutation(s) within the areas targeted by this assay, and inadequate number of viral copies (<131 copies/mL). A negative result must be combined with clinical observations, patient history, and epidemiological information. The expected result is Negative. Fact Sheet for Patients:  PinkCheek.be Fact Sheet for Healthcare Providers:  GravelBags.it This test is not yet ap proved or cleared by the [place] FDA and  has been authorized for detection and/or diagnosis of SARS-CoV-2 by FDA under an Emergency Use Authorization (EUA). This EUA will remain  in effect (meaning this test can be used) for the duration of the COVID-19 declaration under Section 564(b)(1) of the Act, 21 U.S.C. section 360bbb-3(b)(1), unless the authorization is terminated or revoked sooner.    Influenza A by PCR NEGATIVE NEGATIVE Final   Influenza B by PCR NEGATIVE NEGATIVE Final    Comment: (NOTE) The Xpert Xpress SARS-CoV-2/FLU/RSV assay is intended as an aid in  the diagnosis of influenza from Nasopharyngeal swab specimens and  should not be used as a sole basis for treatment. Nasal washings and  aspirates are unacceptable for Xpert Xpress  SARS-CoV-2/FLU/RSV  testing. Fact Sheet for Patients: PinkCheek.be Fact Sheet for Healthcare Providers: GravelBags.it This test is not yet approved or cleared by the [place] FDA and  has been authorized for detection and/or diagnosis of SARS-CoV-2 by  FDA under an Emergency Use Authorization (EUA). This EUA will remain  in effect (meaning this test can be used) for the duration of the  Covid-19 declaration under Section 564(b)(1) of the Act, 21  U.S.C. section 360bbb-3(b)(1), unless the authorization is  terminated or revoked. Performed at [facility], [address], [place], [place] [address]          Radiology Studies: No results found.      Scheduled Meds: . apixaban  5 mg Oral BID  . busPIRone  7.5 mg Oral BID  . diltiazem  5 mg Intravenous Once  . FLUoxetine  40 mg Oral Daily  . levothyroxine  75 mcg Oral QAC breakfast  . predniSONE  10 mg Oral Q breakfast  . sotalol  80 mg Oral Q12H  . tadalafil (PAH)  40 mg Oral QHS   Continuous Infusions: . diltiazem (CARDIZEM) infusion 5 mg/hr (12/31/19 1130)    Assessment & Plan:   Principal Problem:   Gastroenteritis Active Problems:   Type 2 diabetes mellitus (HCC)   Anemia, iron deficiency   Adult hypothyroidism   Hypertension associated with diabetes ([place])   PAF (paroxysmal atrial fibrillation) (HCC)   Interstitial lung disease (HCC)  Gastroenteritis: With persistent nausea, vomiting, and diarrhea.  CT abdomen/pelvis with contrast on 12/29/2019 without acute etiology.  -Continue Phenergan as needed -Continue gentle IV fluid hydration overnight -Follow-up C. 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 -Continue prednisone 10 mg  daily -Holding azathioprine for now as above -Continue supplemental oxygen, maintaining well on home 4 L O2 via [place]  Paroxysmal atrial fibrillation: With intermittent RVR Continue sotalol, start IV Cardizem Had nonsustained VT also Consult cards IV magnesium 2 g started Keep K around 4   Microcytic anemia: H&H slowly has trending down, has received IV fluids could be dilutional.  Stool occult negative.  We will continue to monitor.  Patient denies any hemoptysis or hematemesis.  Pulmonary hypertension: -Continue tadalafil, holding Lasix as she is hypovolemic on admission  Hypertension: Currently stable.  Continue diltiazem and sotalol as above.  Hypothyroidism: TSH 1.87 on 11/03/2019.  Continue Synthroid.  Diet-controlled type 2 diabetes: Continue to monitor.  Depression/anxiety: Continue fluoxetine and BuSpar.  DVT prophylaxis: Eliquis Code Status: Full  Family Communication: none at bedside Disposition Plan: From home, likely discharge to home pending symptomatic improvement and ability to maintain adequate oral intake Barrier: Still with diarrhea, A. fib RVR, nonsustained VT.  Need to make sure arrhythmia is controlled.  Diarrhea symptomatically resolving.     LOS: 0 days   Time spent: 45 minutes or more than 50% COC    Nolberto Hanlon, MD [facility] Pager [phone]  If 7PM-7AM, please contact night-coverage [email or web address] Password [facility] 12/31/2019, 3:24 PM

## 2020-01-01 DIAGNOSIS — K529 Noninfective gastroenteritis and colitis, unspecified: Secondary | ICD-10-CM | POA: Diagnosis not present

## 2020-01-01 LAB — IRON AND TIBC
Iron: 10 ug/dL — ABNORMAL LOW (ref 28–170)
Saturation Ratios: 3 % — ABNORMAL LOW (ref 10.4–31.8)
TIBC: 335 ug/dL (ref 250–450)
UIBC: 325 ug/dL

## 2020-01-01 LAB — BASIC METABOLIC PANEL
Anion gap: 5 (ref 5–15)
BUN: 8 mg/dL (ref 8–23)
CO2: 23 mmol/L (ref 22–32)
Calcium: 7.9 mg/dL — ABNORMAL LOW (ref 8.9–10.3)
Chloride: 111 mmol/L (ref 98–111)
Creatinine, Ser: 0.82 mg/dL (ref 0.44–1.00)
GFR calc Af Amer: >60 mL/min (ref >60)
GFR calc non Af Amer: >60 mL/min (ref >60)
Glucose, Bld: 135 mg/dL — ABNORMAL HIGH (ref 70–99)
Potassium: 3.6 mmol/L (ref 3.5–5.1)
Sodium: 139 mmol/L (ref 135–145)

## 2020-01-01 LAB — FERRITIN: Ferritin: 9 ng/mL — ABNORMAL LOW (ref 11–307)

## 2020-01-01 LAB — HEMOGLOBIN AND HEMATOCRIT, BLOOD
HCT: 27.2 % — ABNORMAL LOW (ref 36.0–46.0)
Hemoglobin: 7.9 g/dL — ABNORMAL LOW (ref 12.0–15.0)

## 2020-01-01 MED ORDER — DILTIAZEM HCL ER COATED BEADS 180 MG PO CP24: 300.0000 mg | ORAL_CAPSULE | Freq: Every day | ORAL | Status: DC

## 2020-01-01 MED ORDER — SODIUM CHLORIDE 0.9 % IV SOLN
510.0000 mg | Freq: Once | INTRAVENOUS | Status: AC
  Administered 2020-01-01: 510 mg via INTRAVENOUS
  Filled 2020-01-01: qty 17

## 2020-01-01 MED ORDER — DILTIAZEM HCL-DEXTROSE 125-5 MG/125ML-% IV SOLN (PREMIX)
5.0000 mg/h | INTRAVENOUS | Status: DC
  Administered 2020-01-01: 5 mg/h via INTRAVENOUS
  Filled 2020-01-01: qty 125

## 2020-01-01 MED ORDER — DILTIAZEM HCL ER COATED BEADS 120 MG PO CP24
240.0000 mg | ORAL_CAPSULE | Freq: Every day | ORAL | Status: DC
  Administered 2020-01-01: 240 mg via ORAL
  Filled 2020-01-01: qty 2

## 2020-01-01 MED ORDER — DILTIAZEM LOAD VIA INFUSION: 10.0000 mg | Freq: Once | INTRAVENOUS | Status: DC

## 2020-01-01 MED ORDER — DILTIAZEM LOAD VIA INFUSION
10.0000 mg | Freq: Once | INTRAVENOUS | Status: AC
  Administered 2020-01-01: 10 mg via INTRAVENOUS
  Filled 2020-01-01: qty 10

## 2020-01-01 MED ORDER — DILTIAZEM HCL 25 MG/5ML IV SOLN
10.0000 mg | Freq: Once | INTRAVENOUS | Status: AC
  Administered 2020-01-01: 10 mg via INTRAVENOUS
  Filled 2020-01-01: qty 5

## 2020-01-01 MED ORDER — POTASSIUM CHLORIDE CRYS ER 20 MEQ PO TBCR
20.0000 meq | EXTENDED_RELEASE_TABLET | Freq: Two times a day (BID) | ORAL | Status: DC
  Administered 2020-01-01 – 2020-01-10 (×19): 20 meq via ORAL
  Filled 2020-01-01 (×19): qty 1

## 2020-01-01 MED ORDER — FERROUS SULFATE 325 (65 FE) MG PO TABS
325.0000 mg | ORAL_TABLET | Freq: Every day | ORAL | Status: DC
  Administered 2020-01-01 – 2020-01-15 (×15): 325 mg via ORAL
  Filled 2020-01-01 (×15): qty 1

## 2020-01-01 NOTE — Progress Notes (Addendum)
PROGRESS NOTE    Julie Mcmillan  UUE:[id number] DOB: 09/29/1941 DOA: 12/30/2019 PCP: Kirk Ruths, MD    Brief Narrative:  Julie Mcmillan is a 79 y.o. female with history of paroxysmal atrial fibrillation, pulmonary hypertension on tadalafil who presented to the emergency room with complaints of abdominal discomfort and increased diarrhea.  Also while in the ED had A. fib RVR intermittently on Cardizem and nonsustained VT.    Consultants:   Cardiology  Procedures: CT and echo  Antimicrobials:      Subjective: Starting to feel better today.  Less diarrhea.  Some nausea but that is not new.  No vomiting Objective: Vitals:   12/31/19 1547 12/31/19 2012 01/01/20 0351 01/01/20 0854  BP: (!) 114/45 (!) 128/59 (!) 107/46 (!) 124/57  Pulse: 68 73 (!) 57 69  Resp: _0 (!) 21  Temp: 98.8 F (37.1 C) 99.3 F (37.4 C) 98.1 F (36.7 C) 98.4 F (36.9 C)  TempSrc: Oral Oral Oral   SpO2: 100% 91% 99% 99%  Weight:   66.8 kg   Height:        Intake/Output Summary (Last 24 hours) at 01/01/2020 1501 Last data filed at 01/01/2020 1330 Gross per 24 hour  Intake 360 ml  Output --  Net 360 ml   Filed Weights   12/30/19 2053 01/01/20 0351  Weight: 65.8 kg 66.8 kg    Examination:  General exam: Appears calm and comfortable  Respiratory system: Clear to auscultation. Respiratory effort normal.  No wheezing Cardiovascular system: S1 & S2 heard, RRR. No JVD, murmurs, rubs, gallops or clicks.  Gastrointestinal system: Abdomen is nondistended, soft and nontender.  Normal bowel sounds heard. Central nervous system: Alert and oriented. No focal neurological deficits. Extremities: No edema Skin: Warm dry Psychiatry: Judgement and insight appear normal. Mood & affect appropriate.     Data Reviewed: I have personally reviewed following labs and imaging studies  CBC: Recent Labs  Lab 12/29/19 1129 12/30/19 2101 12/31/19 0454 01/01/20 0443  WBC 10.0 10.7* 6.6  --    NEUTROABS  --  9.0*  --   --   HGB 10.4* 9.3* 7.9* 7.9*  HCT 35.7* 32.6* 26.9* 27.2*  MCV 72.4* 72.6* 72.1*  --   PLT 245 234 183  --    Basic Metabolic Panel: Recent Labs  Lab 12/29/19 1129 12/30/19 2101 12/31/19 0454 01/01/20 0443  NA 136 138 139 139  K 4.4 3.8 3.7 3.6  CL 102 107 110 111  CO2 23 21* 24 23  GLUCOSE 246* 178* 108* 135*  BUN _1 CREATININE 1.01* 0.93 0.94 0.82  CALCIUM 8.9 8.3* 7.7* 7.9*  MG  --   --  1.8  --    GFR: Estimated Creatinine Clearance: 52.3 mL/min (by C-G formula based on SCr of 0.82 mg/dL). Liver Function Tests: Recent Labs  Lab 12/29/19 1129 12/30/19 2101  AST 25 17  ALT 19 17  ALKPHOS 66 60  BILITOT 0.6 0.5  PROT 6.1* 5.9*  ALBUMIN 3.5 3.5   Recent Labs  Lab 12/29/19 1129 12/30/19 2101  LIPASE 15 20   No results for input(s): AMMONIA in the last 168 hours. Coagulation Profile: No results for input(s): INR, PROTIME in the last 168 hours. Cardiac Enzymes: No results for input(s): CKTOTAL, CKMB, CKMBINDEX, TROPONINI in the last 168 hours. BNP (last 3 results) No results for input(s): PROBNP in the last 8760 hours. HbA1C: No results for input(s): HGBA1C in the last  72 hours. CBG: No results for input(s): GLUCAP in the last 168 hours. Lipid Profile: No results for input(s): CHOL, HDL, LDLCALC, TRIG, CHOLHDL, LDLDIRECT in the last 72 hours. Thyroid Function Tests: No results for input(s): TSH, T4TOTAL, FREET4, T3FREE, THYROIDAB in the last 72 hours. Anemia Panel: Recent Labs    12/30/19 2101 12/31/19 1555 01/01/20 0443  VITAMINB12  --  217  --   FOLATE 10.2  --   --   FERRITIN 9*  --  9*  TIBC 421  --  335  IRON 18*  --  10*   Sepsis Labs: No results for input(s): PROCALCITON, LATICACIDVEN in the last 168 hours.  Recent Results (from the past 240 hour(s))  C Difficile Quick Screen w PCR reflex     Status: None   Collection Time: 12/31/19 12:29 AM   Specimen: Stool  Result Value Ref Range Status   C Diff  antigen NEGATIVE NEGATIVE Final   C Diff toxin NEGATIVE NEGATIVE Final   C Diff interpretation No C. difficile detected.  Final    Comment: Performed at [facility], [address] [place], [place] [address]  Respiratory Panel by RT PCR (Flu A&B, Covid) - Nasopharyngeal Swab     Status: None   Collection Time: 12/31/19 12:29 AM   Specimen: Nasopharyngeal Swab  Result Value Ref Range Status   SARS Coronavirus 2 by RT PCR NEGATIVE NEGATIVE Final    Comment: (NOTE) SARS-CoV-2 target nucleic acids are NOT DETECTED. The SARS-CoV-2 RNA is generally detectable in upper respiratoy specimens during the acute phase of infection. The lowest concentration of SARS-CoV-2 viral copies this assay can detect is 131 copies/mL. A negative result does not preclude SARS-Cov-2 infection and should not be used as the sole basis for treatment or other patient management decisions. A negative result may occur with  improper specimen collection/handling, submission of specimen other than nasopharyngeal swab, presence of viral mutation(s) within the areas targeted by this assay, and inadequate number of viral copies (<131 copies/mL). A negative result must be combined with clinical observations, patient history, and epidemiological information. The expected result is Negative. Fact Sheet for Patients:  PinkCheek.be Fact Sheet for Healthcare Providers:  GravelBags.it This test is not yet ap proved or cleared by the [place] FDA and  has been authorized for detection and/or diagnosis of SARS-CoV-2 by FDA under an Emergency Use Authorization (EUA). This EUA will remain  in effect (meaning this test can be used) for the duration of the COVID-19 declaration under Section 564(b)(1) of the Act, 21 U.S.C. section 360bbb-3(b)(1), unless the authorization is terminated or revoked sooner.    Influenza A by PCR NEGATIVE NEGATIVE Final   Influenza  B by PCR NEGATIVE NEGATIVE Final    Comment: (NOTE) The Xpert Xpress SARS-CoV-2/FLU/RSV assay is intended as an aid in  the diagnosis of influenza from Nasopharyngeal swab specimens and  should not be used as a sole basis for treatment. Nasal washings and  aspirates are unacceptable for Xpert Xpress SARS-CoV-2/FLU/RSV  testing. Fact Sheet for Patients: PinkCheek.be Fact Sheet for Healthcare Providers: GravelBags.it This test is not yet approved or cleared by the [place] FDA and  has been authorized for detection and/or diagnosis of SARS-CoV-2 by  FDA under an Emergency Use Authorization (EUA). This EUA will remain  in effect (meaning this test can be used) for the duration of the  Covid-19 declaration under Section 564(b)(1) of the Act, 21  U.S.C. section 360bbb-3(b)(1), unless the authorization is  terminated or revoked. Performed at [facility], [address] [place], [place] [address]          Radiology Studies: No results found.      Scheduled Meds: . apixaban  5 mg Oral BID  . busPIRone  7.5 mg Oral BID  . diltiazem  240 mg Oral Daily  . ferrous sulfate  325 mg Oral Q breakfast  . FLUoxetine  40 mg Oral Daily  . levothyroxine  75 mcg Oral QAC breakfast  . potassium chloride  20 mEq Oral BID  . predniSONE  10 mg Oral Q breakfast  . sodium chloride flush  3 mL Intravenous Q12H  . sotalol  80 mg Oral Q12H  . tadalafil (PAH)  40 mg Oral QHS   Continuous Infusions:   Assessment & Plan:   Principal Problem:   Gastroenteritis Active Problems:   Type 2 diabetes mellitus (HCC)   Anemia, iron deficiency   Adult hypothyroidism   Hypertension associated with diabetes ([place])   PAF (paroxysmal atrial fibrillation) (HCC)   Interstitial lung disease (HCC)  Gastroenteritis: With persistent nausea, vomiting, and diarrhea.  CT abdomen/pelvis with contrast on 12/29/2019 without acute etiology.    -Continue Phenergan as needed for nausea -Continue gentle IV fluid hydration  -Follow-up C. 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 -Continue prednisone 10 mg daily -Holding azathioprine for now as above -Continue supplemental oxygen, maintaining well on home 4 L O2 via [place]  Paroxysmal atrial fibrillation: With intermittent RVR Continue sotalol Switch to po cardizem from iv Had nonsustained VT also Consult following. IV magnesium 2 g given Keep K around 4, will give supplement today.   Microcytic anemia: H&H slowly has trending down, has received IV fluids could be dilutional.  Stool occult negative.  Iron studies correlate with iron defic. Anemia-will transfuse iv iron x1 today. Will need w/u as outpt.    Pulmonary hypertension: -Continue tadalafil, holding Lasix as she is hypovolemic on admission  Hypertension: Currently stable.  Continue diltiazem and sotalol as above.  Hypothyroidism: TSH 1.87 on 11/03/2019.  Continue Synthroid.  Diet-controlled type 2 diabetes: Continue to monitor.  Depression/anxiety: Continue fluoxetine and BuSpar.  DVT prophylaxis: Eliquis Code Status: Full  Family Communication: none at bedside Disposition Plan: From home, likely discharge to home pending symptomatic improvement and ability to maintain adequate oral intake Barrier: Diarrhea improving but need , A. fib RVR, nonsustained VT controlled .likely d/c in am if HR controlled.  .     LOS: 1 day   Time spent: 45 minutes or more than 50% COC    Nolberto Hanlon, MD [facility] Pager [phone]  If 7PM-7AM, please contact night-coverage [email or web address] Password [facility] 01/01/2020, 3:01 PM Patient ID: Julie Mcmillan, female   DOB: May 04, 1941, 79 y.o.   MRN:  [id number]

## 2020-01-01 NOTE — Progress Notes (Signed)
HR/ rhythm not improved after IV push, HR is 140's, BP stable, MD and cardio notified. Orders given for cardizem bolus and gtt. Cardizem gtt and bolus given with oncoming RN. Reported off to oncoming RN.

## 2020-01-01 NOTE — Progress Notes (Signed)
Patient Name: Julie Mcmillan Date of Encounter: 01/01/2020  Hospital Problem List     Principal Problem:   Gastroenteritis Active Problems:   Type 2 diabetes mellitus (HCC)   Anemia, iron deficiency   Adult hypothyroidism   Hypertension associated with diabetes ([place])   PAF (paroxysmal atrial fibrillation) ([place])   Interstitial lung disease [facility])    Patient Profile     79 y.o. female with history of paroxysmal atrial fibrillation, pulmonary hypertension on tadalafil who presented to the emergency room with complaints of abdominal discomfort and increased diarrhea.  She has had a problem with this in the past.  She is being followed at [facility] EP for her paroxysmal A. fib and has been on sotalol 80 mg twice daily for this along with Eliquis for anticoagulation and diltiazem.  She states she has breakthrough A. fib 3-4 times a month.  She had atrial fibrillation episodes with rates of 144.  She denies chest pain or change in her breathing pattern.  She denies syncope or presyncope.  Laboratories showed a magnesium 1.8.  Potassium was 3.7.  She was placed back on her sotalol.  When she had breakthrough A. fib IV Cardizem was begun. She converted to nsr in the er.   Subjective   Feels a little better this am. Still with some abdominal discomfort. No chest pain. Remains in snr.   Inpatient Medications    . apixaban  5 mg Oral BID  . busPIRone  7.5 mg Oral BID  . diltiazem  30 mg Oral Q6H  . FLUoxetine  40 mg Oral Daily  . levothyroxine  75 mcg Oral QAC breakfast  . predniSONE  10 mg Oral Q breakfast  . sodium chloride flush  3 mL Intravenous Q12H  . sotalol  80 mg Oral Q12H  . tadalafil (PAH)  40 mg Oral QHS    Vital Signs    Vitals:   12/31/19 1547 12/31/19 2012 01/01/20 0351 01/01/20 0854  BP: (!) 114/45 (!) 128/59 (!) 107/46 (!) 124/57  Pulse: 68 73 (!) 57 69  Resp: _0 (!) 21  Temp: 98.8 F (37.1 C) 99.3 F (37.4 C) 98.1 F (36.7 C) 98.4 F  (36.9 C)  TempSrc: Oral Oral Oral   SpO2: 100% 91% 99% 99%  Weight:   66.8 kg   Height:       No intake or output data in the 24 hours ending 01/01/20 0859 Filed Weights   12/30/19 2053 01/01/20 0351  Weight: 65.8 kg 66.8 kg    Physical Exam    GEN: Well nourished, well developed, in no acute distress.  HEENT: normal.  Neck: Supple, no JVD, carotid bruits, or masses. Cardiac: RRR, no murmurs, rubs, or gallops. No clubbing, cyanosis, edema.  Radials/DP/PT 2+ and equal bilaterally.  Respiratory:  Respirations regular and unlabored, clear to auscultation bilaterally. GI: Soft, nontender, nondistended, BS + x 4. MS: no deformity or atrophy. Skin: warm and dry, no rash. Neuro:  Strength and sensation are intact. Psych: Normal affect.  Labs    CBC Recent Labs    12/30/19 2101 12/30/19 2101 12/31/19 0454 01/01/20 0443  WBC 10.7*  --  6.6  --   NEUTROABS 9.0*  --   --   --   HGB 9.3*   < > 7.9* 7.9*  HCT 32.6*   < > 26.9* 27.2*  MCV 72.6*  --  72.1*  --   PLT 234  --  183  --    < > =  values in this interval not displayed.   Basic Metabolic Panel Recent Labs    12/31/19 0454 01/01/20 0443  NA 139 139  K 3.7 3.6  CL 110 111  CO2 24 23  GLUCOSE 108* 135*  BUN 11 8  CREATININE 0.94 0.82  CALCIUM 7.7* 7.9*  MG 1.8  --    Liver Function Tests Recent Labs    12/29/19 1129 12/30/19 2101  AST 25 17  ALT 19 17  ALKPHOS 66 60  BILITOT 0.6 0.5  PROT 6.1* 5.9*  ALBUMIN 3.5 3.5   Recent Labs    12/29/19 1129 12/30/19 2101  LIPASE 15 20   Cardiac Enzymes No results for input(s): CKTOTAL, CKMB, CKMBINDEX, TROPONINI in the last 72 hours. BNP No results for input(s): BNP in the last 72 hours. D-Dimer No results for input(s): DDIMER in the last 72 hours. Hemoglobin A1C No results for input(s): HGBA1C in the last 72 hours. Fasting Lipid Panel No results for input(s): CHOL, HDL, LDLCALC, TRIG, CHOLHDL, LDLDIRECT in the last 72 hours. Thyroid Function  Tests No results for input(s): TSH, T4TOTAL, T3FREE, THYROIDAB in the last 72 hours.  Invalid input(s): FREET3  Telemetry    nsr  ECG    nsr  Radiology    CT ABDOMEN PELVIS W CONTRAST  Result Date: 12/29/2019 CLINICAL DATA:  Nausea, vomiting and diarrhea since last night, intermittent abdominal pain rated 4/10, history of type II diabetes mellitus, atrial fibrillation, diverticulitis, GERD, hypertension, irritable bowel syndrome, pulmonary hypertension, pulmonary fibrosis EXAM: CT ABDOMEN AND PELVIS WITH CONTRAST TECHNIQUE: Multidetector CT imaging of the abdomen and pelvis was performed using the standard protocol following bolus administration of intravenous contrast. Sagittal and coronal MPR images reconstructed from axial data set. CONTRAST:  184m OMNIPAQUE IOHEXOL 300 MG/ML SOLN IV. No oral contrast. COMPARISON:  09/04/2019 FINDINGS: Lower chest: Bibasilar pulmonary fibrosis unchanged. Hepatobiliary: Contracted gallbladder. Liver unremarkable Pancreas: Hypoechoic nodule at body of pancreas, 8 x 12 mm, grossly unchanged. No additional pancreatic abnormalities. Spleen: Several tiny nonspecific low-attenuation foci within spleen, nonspecific. Adrenals/Urinary Tract: Adrenal glands normal appearance. Cortical scarring and small cysts within both kidneys. No enhancing renal mass, hydronephrosis or urinary tract calcification. Bladder and ureters unremarkable. Stomach/Bowel: Low lying cecum in pelvis. Appendix surgically absent by history. Distal colonic diverticulosis without evidence of diverticulitis. Moderate-sized hiatal hernia. Vascular/Lymphatic: Scattered atherosclerotic calcifications aorta and iliac arteries without aneurysm. Additional calcified plaques at the origins of the celiac and superior mesenteric arteries. No adenopathy. Reproductive: Uterus surgically absent with nonvisualization of ovaries Other: No free air or free fluid. No acute inflammatory process or hernia identified.  Musculoskeletal: Osseous demineralization marked compression fracture of T8 vertebral body unchanged since another CT of 10/26/2016. IMPRESSION: Distal colonic diverticulosis without evidence of diverticulitis. Moderate-sized hiatal hernia. Stable cystic lesion at body of pancreas 8 x 12 x 14 mm. Stable bibasilar pulmonary fibrosis. Stable marked compression fracture T8 vertebral body. No acute intra-abdominal or intrapelvic abnormalities. Aortic Atherosclerosis (ICD10-I70.0). Electronically Signed   By: MLavonia DanaM.D.   On: 12/29/2019 13:09   DG Chest Portable 1 View  Result Date: 12/29/2019 CLINICAL DATA:  Pain with nausea and vomiting EXAM: PORTABLE CHEST 1 VIEW COMPARISON:  August 10, 2019 FINDINGS: There is extensive fibrosis throughout the lungs bilaterally. There is no frank edema or consolidation. Heart is upper normal in size with pulmonary vascularity normal. No adenopathy. No bone lesions. There is aortic atherosclerosis. IMPRESSION: Extensive fibrosis throughout the lungs, stable. No edema or airspace opacity. Stable cardiac silhouette.  No adenopathy. Aortic Atherosclerosis (ICD10-I70.0). Electronically Signed   By: Lowella Grip III M.D.   On: 12/29/2019 13:31    Assessment & Plan   79 year old female with history of paroxysmal atrial fibrillation and pulmonary hypertension on tadalafil.  Her A. fib is treated with sotalol 80 twice daily and Cardizem.  She is on Eliquis for anticoagulation.  Her QTC on presenting EKG was less than 500.  She has had several runs of atrial fibrillation with rapid ventricular response.  She is currently hemodynamically stable and remains in nsr.   1.  Atrial fibrillation  -We will continue with sotalol 80 twice daily and follow ryhthm and qtc.    We will continue with Cardizem and will convert back to long acting cardizem 240 daily . Would recommend potassium greater than 4 and Mg greater than 2.   2.  Pulmonary hypertension  -Continue with  tadalafil.  Continue with supplemental oxygen.  3.  Nausea and vomiting with diarrhea  -Work-up pending progress.  Signed, Javier Docker Vincenza Dail MD 01/01/2020, 8:59 AM  Pager: [phone]

## 2020-01-01 NOTE — Progress Notes (Signed)
Patient converted to Aflutter with HR in 140's at rest. Cardio notified, order given for IV cardizem push. Will give and continue to monitor.

## 2020-01-02 ENCOUNTER — Inpatient Hospital Stay
Admit: 2020-01-02 | Discharge: 2020-01-02 | Disposition: A | Discharge status: Home / Self Care | Payer: Medicare HMO | Attending: Internal Medicine | Admitting: Internal Medicine

## 2020-01-02 DIAGNOSIS — K529 Noninfective gastroenteritis and colitis, unspecified: Secondary | ICD-10-CM | POA: Diagnosis not present

## 2020-01-02 LAB — MAGNESIUM: Magnesium: 1.9 mg/dL (ref 1.7–2.4)

## 2020-01-02 LAB — CBC
HCT: 26.1 % — ABNORMAL LOW (ref 36.0–46.0)
Hemoglobin: 7.8 g/dL — ABNORMAL LOW (ref 12.0–15.0)
MCH: 21.4 pg — ABNORMAL LOW (ref 26.0–34.0)
MCHC: 29.9 g/dL — ABNORMAL LOW (ref 30.0–36.0)
MCV: 71.7 fL — ABNORMAL LOW (ref 80.0–100.0)
Platelets: 213 10*3/uL (ref 150–400)
RBC: 3.64 MIL/uL — ABNORMAL LOW (ref 3.87–5.11)
RDW: 18.4 % — ABNORMAL HIGH (ref 11.5–15.5)
WBC: 7 10*3/uL (ref 4.0–10.5)
nRBC: 0 % (ref 0.0–0.2)

## 2020-01-02 LAB — POTASSIUM: Potassium: 4.4 mmol/L (ref 3.5–5.1)

## 2020-01-02 LAB — BRAIN NATRIURETIC PEPTIDE: B Natriuretic Peptide: 313 pg/mL — ABNORMAL HIGH (ref 0.0–100.0)

## 2020-01-02 MED ORDER — DILTIAZEM HCL ER COATED BEADS 240 MG PO CP24
240.0000 mg | ORAL_CAPSULE | Freq: Every day | ORAL | Status: DC
  Administered 2020-01-02 – 2020-01-15 (×14): 240 mg via ORAL
  Filled 2020-01-02 (×2): qty 1
  Filled 2020-01-02: qty 2
  Filled 2020-01-02 (×3): qty 1
  Filled 2020-01-02: qty 2
  Filled 2020-01-02: qty 1
  Filled 2020-01-02: qty 2
  Filled 2020-01-02: qty 1
  Filled 2020-01-02 (×2): qty 2
  Filled 2020-01-02 (×2): qty 1
  Filled 2020-01-02 (×3): qty 2
  Filled 2020-01-02 (×2): qty 1
  Filled 2020-01-02 (×4): qty 2
  Filled 2020-01-02 (×2): qty 1
  Filled 2020-01-02: qty 2
  Filled 2020-01-02: qty 1

## 2020-01-02 MED ORDER — OFF THE BEAT BOOK: Freq: Once | Status: DC

## 2020-01-02 MED ORDER — FUROSEMIDE 20 MG PO TABS
20.0000 mg | ORAL_TABLET | Freq: Every day | ORAL | Status: DC
  Administered 2020-01-02 – 2020-01-15 (×12): 20 mg via ORAL
  Filled 2020-01-02 (×14): qty 1

## 2020-01-02 MED ORDER — SODIUM CHLORIDE 0.9 % IV SOLN
510.0000 mg | Freq: Once | INTRAVENOUS | Status: AC
  Administered 2020-01-02: 510 mg via INTRAVENOUS
  Filled 2020-01-02: qty 17

## 2020-01-02 MED ORDER — FUROSEMIDE 20 MG PO TABS
20.0000 mg | ORAL_TABLET | Freq: Once | ORAL | Status: AC
  Administered 2020-01-02: 20 mg via ORAL
  Filled 2020-01-02: qty 1

## 2020-01-02 MED ORDER — TRAZODONE HCL 50 MG PO TABS
50.0000 mg | ORAL_TABLET | Freq: Every day | ORAL | Status: DC
  Administered 2020-01-02 – 2020-01-14 (×12): 50 mg via ORAL
  Filled 2020-01-02 (×13): qty 1

## 2020-01-02 NOTE — Progress Notes (Signed)
This nurse received call from [place]. Patient with heart rate in the 120's with a 17 beat run vtach.  In to see patient and she is resting in bed. States that she has just walked to bathroom.  Patient states that she did fell winded.  Patient agreeable to call staff when she needs to get up.  Dr. Ubaldo Glassing and Dr. Kurtis Bushman made aware, no new orders received.

## 2020-01-02 NOTE — Progress Notes (Signed)
Notified of pause and conversion to SB noted on telemetry. HR 50s  Cardizem drip discontinued at this time.VSS.

## 2020-01-02 NOTE — Progress Notes (Signed)
Patient Name: Julie Mcmillan Date of Encounter: 01/02/2020  Hospital Problem List     Principal Problem:   Gastroenteritis Active Problems:   Type 2 diabetes mellitus (HCC)   Anemia, iron deficiency   Adult hypothyroidism   Hypertension associated with diabetes ([place])   PAF (paroxysmal atrial fibrillation) ([place])   Interstitial lung disease ([place])    Patient Profile     79 y.o.femalewith history ofparoxysmal atrial fibrillation, pulmonary hypertension on tadalafil who presented to the emergency room with complaints of abdominal discomfort and increased diarrhea. She has had a problem with this in the past. She is being followed at [facility] EP for her paroxysmal A. fib and has been on sotalol 80 mg twice daily for this along with Eliquis for anticoagulation and diltiazem. She states she has breakthrough A. fib 3-4 times a month. She had atrial fibrillation episodes with rates of 144. She denies chest pain or change in her breathing pattern. She denies syncope or presyncope. Laboratories showed a magnesium 1.8. Potassium was 3.7.   Subjective   Continues to have intermittent A. fib with RVR intermittently converted back to sinus rhythm.  Required transient IV Cardizem yesterday.  Currently in sinus rhythm  Inpatient Medications    . apixaban  5 mg Oral BID  . busPIRone  7.5 mg Oral BID  . diltiazem  240 mg Oral Daily  . ferrous sulfate  325 mg Oral Q breakfast  . FLUoxetine  40 mg Oral Daily  . furosemide  20 mg Oral Daily  . levothyroxine  75 mcg Oral QAC breakfast  . off the beat book   Does not apply Once  . potassium chloride  20 mEq Oral BID  . predniSONE  10 mg Oral Q breakfast  . sodium chloride flush  3 mL Intravenous Q12H  . sotalol  80 mg Oral Q12H  . tadalafil (PAH)  40 mg Oral QHS    Vital Signs    Vitals:   01/02/20 0002 01/02/20 0133 01/02/20 0438 01/02/20 0831  BP: (!) 111/54  (!) 104/47 (!) 107/43  Pulse: 62 81 (!) 53 (!)  58  Resp: 20   18  Temp: 98.7 F (37.1 C)  98.1 F (36.7 C) 98.3 F (36.8 C)  TempSrc: Oral  Oral Oral  SpO2: 100%  100% 99%  Weight:   66.7 kg   Height:        Intake/Output Summary (Last 24 hours) at 01/02/2020 0948 Last data filed at 01/02/2020 0442 Gross per 24 hour  Intake 717 ml  Output 0 ml  Net 717 ml   Filed Weights   12/30/19 2053 01/01/20 0351 01/02/20 0438  Weight: 65.8 kg 66.8 kg 66.7 kg    Physical Exam    GEN: Well nourished, well developed, in no acute distress.  HEENT: normal.  Neck: Supple, no JVD, carotid bruits, or masses. Cardiac: RRR, no murmurs, rubs, or gallops. No clubbing, cyanosis, edema.  Radials/DP/PT 2+ and equal bilaterally.  Respiratory:  Respirations regular and unlabored, clear to auscultation bilaterally. GI: Soft, nontender, nondistended, BS + x 4. MS: no deformity or atrophy. Skin: warm and dry, no rash. Neuro:  Strength and sensation are intact. Psych: Normal affect.  Labs    CBC Recent Labs    12/30/19 2101 12/30/19 2101 12/31/19 0454 12/31/19 0454 01/01/20 0443 01/02/20 0502  WBC 10.7*   < > 6.6  --   --  7.0  NEUTROABS 9.0*  --   --   --   --   --  HGB 9.3*   < > 7.9*   < > 7.9* 7.8*  HCT 32.6*   < > 26.9*   < > 27.2* 26.1*  MCV 72.6*   < > 72.1*  --   --  71.7*  PLT 234   < > 183  --   --  213   < > = values in this interval not displayed.   Basic Metabolic Panel Recent Labs    12/31/19 0454 12/31/19 0454 01/01/20 0443 01/02/20 0502  NA 139  --  139  --   K 3.7   < > 3.6 4.4  CL 110  --  111  --   CO2 24  --  23  --   GLUCOSE 108*  --  135*  --   BUN 11  --  8  --   CREATININE 0.94  --  0.82  --   CALCIUM 7.7*  --  7.9*  --   MG 1.8  --   --  1.9   < > = values in this interval not displayed.   Liver Function Tests Recent Labs    12/30/19 2101  AST 17  ALT 17  ALKPHOS 60  BILITOT 0.5  PROT 5.9*  ALBUMIN 3.5   Recent Labs    12/30/19 2101  LIPASE 20   Cardiac Enzymes No results for  input(s): CKTOTAL, CKMB, CKMBINDEX, TROPONINI in the last 72 hours. BNP Recent Labs    01/02/20 0502  BNP 313.0*   D-Dimer No results for input(s): DDIMER in the last 72 hours. Hemoglobin A1C No results for input(s): HGBA1C in the last 72 hours. Fasting Lipid Panel No results for input(s): CHOL, HDL, LDLCALC, TRIG, CHOLHDL, LDLDIRECT in the last 72 hours. Thyroid Function Tests No results for input(s): TSH, T4TOTAL, T3FREE, THYROIDAB in the last 72 hours.  Invalid input(s): FREET3  Telemetry    Last 24 hours atrial fibrillation rapid ventricular response with improved rate and conversion back to sinus rhythm  ECG    A. fib with RVR  Radiology    CT ABDOMEN PELVIS W CONTRAST  Result Date: 12/29/2019 CLINICAL DATA:  Nausea, vomiting and diarrhea since last night, intermittent abdominal pain rated 4/10, history of type II diabetes mellitus, atrial fibrillation, diverticulitis, GERD, hypertension, irritable bowel syndrome, pulmonary hypertension, pulmonary fibrosis EXAM: CT ABDOMEN AND PELVIS WITH CONTRAST TECHNIQUE: Multidetector CT imaging of the abdomen and pelvis was performed using the standard protocol following bolus administration of intravenous contrast. Sagittal and coronal MPR images reconstructed from axial data set. CONTRAST:  145m OMNIPAQUE IOHEXOL 300 MG/ML SOLN IV. No oral contrast. COMPARISON:  09/04/2019 FINDINGS: Lower chest: Bibasilar pulmonary fibrosis unchanged. Hepatobiliary: Contracted gallbladder. Liver unremarkable Pancreas: Hypoechoic nodule at body of pancreas, 8 x 12 mm, grossly unchanged. No additional pancreatic abnormalities. Spleen: Several tiny nonspecific low-attenuation foci within spleen, nonspecific. Adrenals/Urinary Tract: Adrenal glands normal appearance. Cortical scarring and small cysts within both kidneys. No enhancing renal mass, hydronephrosis or urinary tract calcification. Bladder and ureters unremarkable. Stomach/Bowel: Low lying cecum in  pelvis. Appendix surgically absent by history. Distal colonic diverticulosis without evidence of diverticulitis. Moderate-sized hiatal hernia. Vascular/Lymphatic: Scattered atherosclerotic calcifications aorta and iliac arteries without aneurysm. Additional calcified plaques at the origins of the celiac and superior mesenteric arteries. No adenopathy. Reproductive: Uterus surgically absent with nonvisualization of ovaries Other: No free air or free fluid. No acute inflammatory process or hernia identified. Musculoskeletal: Osseous demineralization marked compression fracture of T8 vertebral body unchanged since another CT of  10/26/2016. IMPRESSION: Distal colonic diverticulosis without evidence of diverticulitis. Moderate-sized hiatal hernia. Stable cystic lesion at body of pancreas 8 x 12 x 14 mm. Stable bibasilar pulmonary fibrosis. Stable marked compression fracture T8 vertebral body. No acute intra-abdominal or intrapelvic abnormalities. Aortic Atherosclerosis (ICD10-I70.0). Electronically Signed   By: Lavonia Dana M.D.   On: 12/29/2019 13:09   DG Chest Portable 1 View  Result Date: 12/29/2019 CLINICAL DATA:  Pain with nausea and vomiting EXAM: PORTABLE CHEST 1 VIEW COMPARISON:  August 10, 2019 FINDINGS: There is extensive fibrosis throughout the lungs bilaterally. There is no frank edema or consolidation. Heart is upper normal in size with pulmonary vascularity normal. No adenopathy. No bone lesions. There is aortic atherosclerosis. IMPRESSION: Extensive fibrosis throughout the lungs, stable. No edema or airspace opacity. Stable cardiac silhouette. No adenopathy. Aortic Atherosclerosis (ICD10-I70.0). Electronically Signed   By: Lowella Grip III M.D.   On: 12/29/2019 13:31    Assessment & Plan    79 year old female with history of paroxysmal atrial fibrillation and pulmonary hypertension on tadalafil. Her A. fib is treated with sotalol 80 twice daily and Cardizem. She is on Eliquis for  anticoagulation. Her QTC on presenting EKG was less than 500. She has had several runs of atrial fibrillation with rapid ventricular response. She is currently hemodynamically stable and remains in nsr.   1. Atrial fibrillation  -We will continue with sotalol 80 twice daily and follow ryhthm and qtc.  We will continue with Cardizem and will convert back to long acting cardizem 240 daily . Would recommend potassium greater than 4 and Mg greater than 2.   2. Pulmonary hypertension  -Continue with tadalafil. Continue with supplemental oxygen.  3. Nausea and vomiting with diarrhea  -Improved.  Appetite better.  Signed, Javier Docker Reisa Coppola MD 01/02/2020, 9:48 AM  Pager: [phone]

## 2020-01-02 NOTE — Progress Notes (Signed)
PROGRESS NOTE    [name]  AUQ:[id number] DOB: Aug 14, 1941 DOA: 12/30/2019 PCP: Kirk Ruths, MD    Brief Narrative:  Julie Mcmillan is a 79 y.o. female with history of paroxysmal atrial fibrillation, pulmonary hypertension on tadalafil who presented to the emergency room with complaints of abdominal discomfort and increased diarrhea.  Also while in the ED had A. fib RVR intermittently on Cardizem and nonsustained VT.    Consultants:   Cardiology  Procedures: CT and echo  Antimicrobials:      Subjective: Starting to feel better today.  Less diarrhea.  Some nausea but that is not new.  No vomiting Objective: Vitals:   01/02/20 0438 01/02/20 0831 01/02/20 0900 01/02/20 1138  BP: (!) 104/47 (!) 107/43  (!) 144/61  Pulse: (!) 53 (!) 58  61  Resp:  _0 Temp: 98.1 F (36.7 C) 98.3 F (36.8 C)  98.1 F (36.7 C)  TempSrc: Oral Oral  Oral  SpO2: 100% 99%  100%  Weight: 66.7 kg     Height:        Intake/Output Summary (Last 24 hours) at 01/02/2020 1216 Last data filed at 01/02/2020 1153 Gross per 24 hour  Intake 597 ml  Output 200 ml  Net 397 ml   Filed Weights   12/30/19 2053 01/01/20 0351 01/02/20 0438  Weight: 65.8 kg 66.8 kg 66.7 kg    Examination:  General exam: Appears calm and comfortable  Respiratory system: Clear to auscultation. Respiratory effort normal.  No wheezing Cardiovascular system: S1 & S2 heard, RRR. No JVD, murmurs, rubs, gallops or clicks.  Gastrointestinal system: Abdomen is nondistended, soft and nontender.  Normal bowel sounds heard. Central nervous system: Alert and oriented. No focal neurological deficits. Extremities: No edema Skin: Warm dry Psychiatry: Judgement and insight appear normal. Mood & affect appropriate.     Data Reviewed: I have personally reviewed following labs and imaging studies  CBC: Recent Labs  Lab 12/29/19 1129 12/30/19 2101 12/31/19 0454 01/01/20 0443 01/02/20 0502  WBC 10.0 10.7* 6.6   --  7.0  NEUTROABS  --  9.0*  --   --   --   HGB 10.4* 9.3* 7.9* 7.9* 7.8*  HCT 35.7* 32.6* 26.9* 27.2* 26.1*  MCV 72.4* 72.6* 72.1*  --  71.7*  PLT 245 234 183  --  638   Basic Metabolic Panel: Recent Labs  Lab 12/29/19 1129 12/30/19 2101 12/31/19 0454 01/01/20 0443 01/02/20 0502  NA 136 138 139 139  --   K 4.4 3.8 3.7 3.6 4.4  CL 102 107 110 111  --   CO2 23 21* 24 23  --   GLUCOSE 246* 178* 108* 135*  --   BUN _1 --   CREATININE 1.01* 0.93 0.94 0.82  --   CALCIUM 8.9 8.3* 7.7* 7.9*  --   MG  --   --  1.8  --  1.9   GFR: Estimated Creatinine Clearance: 52.3 mL/min (by C-G formula based on SCr of 0.82 mg/dL). Liver Function Tests: Recent Labs  Lab 12/29/19 1129 12/30/19 2101  AST 25 17  ALT 19 17  ALKPHOS 66 60  BILITOT 0.6 0.5  PROT 6.1* 5.9*  ALBUMIN 3.5 3.5   Recent Labs  Lab 12/29/19 1129 12/30/19 2101  LIPASE 15 20   No results for input(s): AMMONIA in the last 168 hours. Coagulation Profile: No results for input(s): INR, PROTIME in the last 168 hours. Cardiac Enzymes:  No results for input(s): CKTOTAL, CKMB, CKMBINDEX, TROPONINI in the last 168 hours. BNP (last 3 results) No results for input(s): PROBNP in the last 8760 hours. HbA1C: No results for input(s): HGBA1C in the last 72 hours. CBG: No results for input(s): GLUCAP in the last 168 hours. Lipid Profile: No results for input(s): CHOL, HDL, LDLCALC, TRIG, CHOLHDL, LDLDIRECT in the last 72 hours. Thyroid Function Tests: No results for input(s): TSH, T4TOTAL, FREET4, T3FREE, THYROIDAB in the last 72 hours. Anemia Panel: Recent Labs    12/30/19 2101 12/31/19 1555 01/01/20 0443  VITAMINB12  --  217  --   FOLATE 10.2  --   --   FERRITIN 9*  --  9*  TIBC 421  --  335  IRON 18*  --  10*   Sepsis Labs: No results for input(s): PROCALCITON, LATICACIDVEN in the last 168 hours.  Recent Results (from the past 240 hour(s))  C Difficile Quick Screen w PCR reflex     Status: None    Collection Time: 12/31/19 12:29 AM   Specimen: Stool  Result Value Ref Range Status   C Diff antigen NEGATIVE NEGATIVE Final   C Diff toxin NEGATIVE NEGATIVE Final   C Diff interpretation No C. difficile detected.  Final    Comment: Performed at [facility], [address] [place], [place] [address]  Respiratory Panel by RT PCR (Flu A&B, Covid) - Nasopharyngeal Swab     Status: None   Collection Time: 12/31/19 12:29 AM   Specimen: Nasopharyngeal Swab  Result Value Ref Range Status   SARS Coronavirus 2 by RT PCR NEGATIVE NEGATIVE Final    Comment: (NOTE) SARS-CoV-2 target nucleic acids are NOT DETECTED. The SARS-CoV-2 RNA is generally detectable in upper respiratoy specimens during the acute phase of infection. The lowest concentration of SARS-CoV-2 viral copies this assay can detect is 131 copies/mL. A negative result does not preclude SARS-Cov-2 infection and should not be used as the sole basis for treatment or other patient management decisions. A negative result may occur with  improper specimen collection/handling, submission of specimen other than nasopharyngeal swab, presence of viral mutation(s) within the areas targeted by this assay, and inadequate number of viral copies (<131 copies/mL). A negative result must be combined with clinical observations, patient history, and epidemiological information. The expected result is Negative. Fact Sheet for Patients:  PinkCheek.be Fact Sheet for Healthcare Providers:  GravelBags.it This test is not yet ap proved or cleared by the [place] FDA and  has been authorized for detection and/or diagnosis of SARS-CoV-2 by FDA under an Emergency Use Authorization (EUA). This EUA will remain  in effect (meaning this test can be used) for the duration of the COVID-19 declaration under Section 564(b)(1) of the Act, 21 U.S.C. section 360bbb-3(b)(1), unless the  authorization is terminated or revoked sooner.    Influenza A by PCR NEGATIVE NEGATIVE Final   Influenza B by PCR NEGATIVE NEGATIVE Final    Comment: (NOTE) The Xpert Xpress SARS-CoV-2/FLU/RSV assay is intended as an aid in  the diagnosis of influenza from Nasopharyngeal swab specimens and  should not be used as a sole basis for treatment. Nasal washings and  aspirates are unacceptable for Xpert Xpress SARS-CoV-2/FLU/RSV  testing. Fact Sheet for Patients: PinkCheek.be Fact Sheet for Healthcare Providers: GravelBags.it This test is not yet approved or cleared by the [place] FDA and  has been authorized for detection and/or diagnosis of SARS-CoV-2 by  FDA under an Emergency Use Authorization (EUA). This  EUA will remain  in effect (meaning this test can be used) for the duration of the  Covid-19 declaration under Section 564(b)(1) of the Act, 21  U.S.C. section 360bbb-3(b)(1), unless the authorization is  terminated or revoked. Performed at [facility], [address], [place], [place] [address]          Radiology Studies: No results found.      Scheduled Meds: . apixaban  5 mg Oral BID  . busPIRone  7.5 mg Oral BID  . diltiazem  240 mg Oral Daily  . ferrous sulfate  325 mg Oral Q breakfast  . FLUoxetine  40 mg Oral Daily  . furosemide  20 mg Oral Daily  . furosemide  20 mg Oral Once  . levothyroxine  75 mcg Oral QAC breakfast  . off the beat book   Does not apply Once  . potassium chloride  20 mEq Oral BID  . predniSONE  10 mg Oral Q breakfast  . sodium chloride flush  3 mL Intravenous Q12H  . sotalol  80 mg Oral Q12H  . tadalafil (PAH)  40 mg Oral QHS   Continuous Infusions: . diltiazem (CARDIZEM) infusion Stopped (01/01/20 2357)    Assessment & Plan:   Principal Problem:   Gastroenteritis Active Problems:   Type 2 diabetes mellitus (HCC)   Anemia, iron deficiency   Adult  hypothyroidism   Hypertension associated with diabetes ([place])   PAF (paroxysmal atrial fibrillation) (HCC)   Interstitial lung disease (HCC)  Gastroenteritis: With persistent nausea, vomiting, and diarrhea.  CT abdomen/pelvis with contrast on 12/29/2019 without acute etiology.  Her diarrhea has resolved. -Follow-up C. 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 -Continue prednisone 10 mg daily -Holding azathioprine for now as above -Continue supplemental oxygen, maintaining well on home 4 L O2 via   Paroxysmal atrial fibrillation: With intermittent RVR.  Was on IV Cardizem drip overnight.  Now in sinus rhythm.  Will go back on long-acting Cardizem 240 daily. Potassium and magnesium stable Continue sotalol Had nonsustained VT previously. Cards following IV magnesium 2 g was given.    Microcytic anemia: H&H slowly has trending down, has received IV fluids could be dilutional.  Stool occult negative.  Iron studies correlate with iron defic. Anemia- Transfused iv iron yesterday, will repeat today.  Told patient she needs to follow-up with PCP for outpatient work-up.       Pulmonary hypertension: -Continue tadalafil Early volume overloaded likely due to A. fib RVR.  Will resume her Lasix outpatient 20 mg daily, will give an extra 20 mg p.o. today.   Hypertension: Currently stable.  Continue diltiazem and sotalol as above.  Hypothyroidism: TSH 1.87 on 11/03/2019.  Continue Synthroid.  Diet-controlled type 2 diabetes: Continue to monitor.  Depression/anxiety: Continue fluoxetine and BuSpar.  DVT prophylaxis: Eliquis Code Status: Full  Family Communication: none at bedside Disposition Plan: From home, likely discharge to home pending symptomatic improvement  and ability to maintain adequate oral intake Barrier: If no further A. fib RVR today will DC in a.m.    LOS: 2 days   Time spent: 45 minutes or more than 50% COC    Nolberto Hanlon, MD [facility] Pager [phone]  If 7PM-7AM, please contact night-coverage [email or web address] Password [facility] 01/02/2020, 12:16 PM Patient ID: POET HINEMAN, female   DOB: 03-03-1941, 79 y.o.  MRN: [id number] Patient ID: MEILA BERKE, female   DOB: [birth year], 79 y.o.   MRN: [id number]

## 2020-01-02 NOTE — Evaluation (Signed)
Physical Therapy Evaluation Patient Details Name: Julie Mcmillan MRN: [id number] DOB: [birth year] Today's Date: 01/02/2020   History of Present Illness  79 y.o. female with history of paroxysmal atrial fibrillation, pulmonary hypertension on tadalafil who presented to the emergency room with complaints of abdominal discomfort and increased diarrhea.  She has had a problem with this in the past.  She is being followed at [facility] EP for her paroxysmal A. fib. PLOF ind without AD  Clinical Impression  Patient and CNA had just walked to restroom and back to bed prior to PT arrival, CNA reports no AD use to ambulate, patient reports she is fatigued following this. Patient able to complete supine > sit > stand modI with UE use and take a few steps to chair. In standing patient is able to demonstrate good standing balance, with difficulty maintaining with BOS changes. Following upright therex, patient with O2 de-sat to 87% that is quickly elevated to 93% with pursed lip breathing education with good carry from patient. Pt will benefit from skilled HHPT and acute PT to increase activity tolerance and for O2 monitoring with OOB activity.     Follow Up Recommendations Home health PT    Equipment Recommendations  Rolling walker with 5" wheels    Recommendations for Other Services       Precautions / Restrictions        Mobility  Bed Mobility Overal bed mobility: Needs Assistance Bed Mobility: Sidelying to Sit   Sidelying to sit: Independent          Transfers Overall transfer level: Needs assistance   Transfers: Sit to/from Stand Sit to Stand: Modified independent (Device/Increase time)         General transfer comment: UE use needed  Ambulation/Gait Ambulation/Gait assistance: Independent Gait Distance (Feet): 3 Feet   Gait Pattern/deviations: WFL(Within Functional Limits) Gait velocity: decreased      Stairs            Wheelchair Mobility     Modified Rankin (Stroke Patients Only)       Balance Overall balance assessment: Needs assistance Sitting-balance support: Feet supported Sitting balance-Leahy Scale: Good     Standing balance support: No upper extremity supported;During functional activity Standing balance-Leahy Scale: Good   Single Leg Stance - Right Leg: 0 Single Leg Stance - Left Leg: 0 Tandem Stance - Right Leg: 3 Tandem Stance - Left Leg: 3 Rhomberg - Eyes Opened: 30 Rh[place]berg - Eyes Closed: 15   High Level Balance Comments: does well using stepping strategies to re-establish balance from these positions as needed             Pertinent Vitals/Pain      Home Living Family/patient expects to be discharged to:: Private residence(senior living apartment) Living Arrangements: Alone Available Help at Discharge: Family Type of Home: Apartment Home Access: Level entry     Home Layout: One level Home Equipment: Environmental consultant - 2 wheels;Shower seat Additional Comments: grab bars, pull cords, handicapped height counters    Prior Function                 Hand Dominance        Extremity/Trunk Assessment   Upper Extremity Assessment Upper Extremity Assessment: Overall WFL for tasks assessed    Lower Extremity Assessment Lower Extremity Assessment: Overall WFL for tasks assessed    Cervical / Trunk Assessment Cervical / Trunk Assessment: Normal  Communication      Cognition Arousal/Alertness: Awake/alert Behavior During Therapy: [facility]  for tasks assessed/performed Overall Cognitive Status: Within Functional Limits for tasks assessed                                        General Comments      Exercises Other Exercises Other Exercises: Patient able to complete bed mobility and STS with cuing needed, UE assist, modI Other Exercises: Balance therex with assistance needed to acheive positions above, patient with good stepping stragies to re-estabilish balance. O@ following  standing therex 87%, wquickly rises to 93% following witting with pursed lip breathing education.   Assessment/Plan    PT Assessment Patient needs continued PT services  PT Problem List Decreased strength;Decreased range of motion;Decreased activity tolerance;Decreased balance;Decreased coordination;Decreased mobility       PT Treatment Interventions DME instruction;Gait training;Stair training;Functional mobility training;Neuromuscular re-education;Balance training;Therapeutic exercise;Patient/family education;Therapeutic activities    PT Goals (Current goals can be found in the Care Plan section)  Acute Rehab PT Goals Patient Stated Goal: go home PT Goal Formulation: With patient Time For Goal Achievement: 01/16/20 Potential to Achieve Goals: Fair    Frequency Min 2X/week   Barriers to discharge        Co-evaluation               AM-PAC PT "6 Clicks" Mobility  Outcome Measure Help needed turning from your back to your side while in a flat bed without using bedrails?: None Help needed moving from lying on your back to sitting on the side of a flat bed without using bedrails?: None Help needed moving to and from a bed to a chair (including a wheelchair)?: None Help needed standing up from a chair using your arms (e.g., wheelchair or bedside chair)?: None Help needed to walk in hospital room?: A Little Help needed climbing 3-5 steps with a railing? : A Lot 6 Click Score: 21    End of Session Equipment Utilized During Treatment: Gait belt Activity Tolerance: Patient tolerated treatment well Patient left: in chair;with chair alarm set;with call bell/phone within reach Nurse Communication: Mobility status PT Visit Diagnosis: Other abnormalities of gait and mobility (R26.89);Unsteadiness on feet (R26.81);Muscle weakness (generalized) (M62.81)    Time: 6237-6283 PT Time Calculation (min) (ACUTE ONLY): 24 min   Charges:   PT Evaluation $PT Eval Moderate Complexity: 1  Mod PT Treatments $Therapeutic Activity: 8-22 mins        Shelton Silvas PT, DPT  Shelton Silvas 01/02/2020, 12:27 PM

## 2020-01-02 NOTE — Progress Notes (Signed)
Labs reviewed  with on call NP on rounds.  Hgb noted and CBC scheduled for this AM.

## 2020-01-02 NOTE — Progress Notes (Signed)
*  PRELIMINARY RESULTS* Echocardiogram 2D Echocardiogram has been performed.  Lavell Luster Arshad Oberholzer 01/02/2020, 2:49 PM

## 2020-01-03 DIAGNOSIS — K529 Noninfective gastroenteritis and colitis, unspecified: Secondary | ICD-10-CM | POA: Diagnosis not present

## 2020-01-03 LAB — ECHOCARDIOGRAM COMPLETE
Height: 64 in
Weight: 2352 oz

## 2020-01-03 LAB — POTASSIUM: Potassium: 4.9 mmol/L (ref 3.5–5.1)

## 2020-01-03 NOTE — Progress Notes (Signed)
Patient Name: Julie Mcmillan Date of Encounter: 01/03/2020  Hospital Problem List     Principal Problem:   Gastroenteritis Active Problems:   Type 2 diabetes mellitus (HCC)   Anemia, iron deficiency   Adult hypothyroidism   Hypertension associated with diabetes ([place])   PAF (paroxysmal atrial fibrillation) ([place])   Interstitial lung disease ([place])    Patient Profile     79 y.o.femalewith history ofparoxysmal atrial fibrillation, pulmonary hypertension on tadalafil who presented to the emergency room with complaints of abdominal discomfort and increased diarrhea. She has had a problem with this in the past. She is being followed at [facility] for her paroxysmal A. fib and has been on sotalol 80 mg twice daily for this along with Eliquis for anticoagulation and diltiazem. She states she has breakthrough A. fib 3-4 times a month. She had atrial fibrillation episodes with rates of 144. She denies chest pain or change in her breathing pattern. She denies syncope or presyncope. Laboratories on admission showed a magnesium 1.8. Potassium was 3.7.   Subjective   Feels back to her baseline.  Her GI symptoms have improved.  Still has breakthrough tachycardic episodes when ambulating.  Appears to be SVT versus aberrant A. fib.  Patient is asymptomatic with this.  Inpatient Medications    . apixaban  5 mg Oral BID  . busPIRone  7.5 mg Oral BID  . diltiazem  240 mg Oral Daily  . ferrous sulfate  325 mg Oral Q breakfast  . FLUoxetine  40 mg Oral Daily  . furosemide  20 mg Oral Daily  . levothyroxine  75 mcg Oral QAC breakfast  . off the beat book   Does not apply Once  . potassium chloride  20 mEq Oral BID  . predniSONE  10 mg Oral Q breakfast  . sodium chloride flush  3 mL Intravenous Q12H  . sotalol  80 mg Oral Q12H  . tadalafil (PAH)  40 mg Oral QHS  . traZODone  50 mg Oral QHS    Vital Signs    Vitals:   01/02/20 1610 01/02/20 1949 01/03/20 0608  01/03/20 0808  BP: (!) 116/58 (!) 113/52 (!) 103/46 130/65  Pulse: 62 60 (!) 55 60  Resp: _0 Temp: 98 F (36.7 C) 98.6 F (37 C) 98.4 F (36.9 C) 97.8 F (36.6 C)  TempSrc: Oral Oral Oral Oral  SpO2: 97% 97% 99% 98%  Weight:   65.9 kg   Height:        Intake/Output Summary (Last 24 hours) at 01/03/2020 1147 Last data filed at 01/03/2020 1113 Gross per 24 hour  Intake 0 ml  Output 750 ml  Net -750 ml   Filed Weights   01/01/20 0351 01/02/20 0438 01/03/20 0608  Weight: 66.8 kg 66.7 kg 65.9 kg    Physical Exam    GEN: Well nourished, well developed, in no acute distress.  HEENT: normal.  Neck: Supple, no JVD, carotid bruits, or masses. Cardiac: RRR, no murmurs, rubs, or gallops. No clubbing, cyanosis, edema.  Radials/DP/PT 2+ and equal bilaterally.  Respiratory:  Respirations regular and unlabored, clear to auscultation bilaterally. GI: Soft, nontender, nondistended, BS + x 4. MS: no deformity or atrophy. Skin: warm and dry, no rash. Neuro:  Strength and sensation are intact. Psych: Normal affect.  Labs    CBC Recent Labs    01/01/20 0443 01/02/20 0502  WBC  --  7.0  HGB 7.9* 7.8*  HCT 27.2* 26.1*  MCV  --  71.7*  PLT  --  417   Basic Metabolic Panel Recent Labs    01/01/20 0443 01/01/20 0443 01/02/20 0502 01/03/20 0452  NA 139  --   --   --   K 3.6   < > 4.4 4.9  CL 111  --   --   --   CO2 23  --   --   --   GLUCOSE 135*  --   --   --   BUN 8  --   --   --   CREATININE 0.82  --   --   --   CALCIUM 7.9*  --   --   --   MG  --   --  1.9  --    < > = values in this interval not displayed.   Liver Function Tests No results for input(s): AST, ALT, ALKPHOS, BILITOT, PROT, ALBUMIN in the last 72 hours. No results for input(s): LIPASE, AMYLASE in the last 72 hours. Cardiac Enzymes No results for input(s): CKTOTAL, CKMB, CKMBINDEX, TROPONINI in the last 72 hours. BNP Recent Labs    01/02/20 0502  BNP 313.0*   D-Dimer No results for  input(s): DDIMER in the last 72 hours. Hemoglobin A1C No results for input(s): HGBA1C in the last 72 hours. Fasting Lipid Panel No results for input(s): CHOL, HDL, LDLCALC, TRIG, CHOLHDL, LDLDIRECT in the last 72 hours. Thyroid Function Tests No results for input(s): TSH, T4TOTAL, T3FREE, THYROIDAB in the last 72 hours.  Invalid input(s): FREET3  Telemetry    Sinus rhythm with intermittent runs of narrow complex and wide-complex tachycardia.  This is nonsustained.  Typically when she is ambulating. ECG    Sinus rhythm with intermittent A. fib with RVR  Radiology    CT ABDOMEN PELVIS W CONTRAST  Result Date: 12/29/2019 CLINICAL DATA:  Nausea, vomiting and diarrhea since last night, intermittent abdominal pain rated 4/10, history of type II diabetes mellitus, atrial fibrillation, diverticulitis, GERD, hypertension, irritable bowel syndrome, pulmonary hypertension, pulmonary fibrosis EXAM: CT ABDOMEN AND PELVIS WITH CONTRAST TECHNIQUE: Multidetector CT imaging of the abdomen and pelvis was performed using the standard protocol following bolus administration of intravenous contrast. Sagittal and coronal MPR images reconstructed from axial data set. CONTRAST:  117m OMNIPAQUE IOHEXOL 300 MG/ML SOLN IV. No oral contrast. COMPARISON:  09/04/2019 FINDINGS: Lower chest: Bibasilar pulmonary fibrosis unchanged. Hepatobiliary: Contracted gallbladder. Liver unremarkable Pancreas: Hypoechoic nodule at body of pancreas, 8 x 12 mm, grossly unchanged. No additional pancreatic abnormalities. Spleen: Several tiny nonspecific low-attenuation foci within spleen, nonspecific. Adrenals/Urinary Tract: Adrenal glands normal appearance. Cortical scarring and small cysts within both kidneys. No enhancing renal mass, hydronephrosis or urinary tract calcification. Bladder and ureters unremarkable. Stomach/Bowel: Low lying cecum in pelvis. Appendix surgically absent by history. Distal colonic diverticulosis without evidence  of diverticulitis. Moderate-sized hiatal hernia. Vascular/Lymphatic: Scattered atherosclerotic calcifications aorta and iliac arteries without aneurysm. Additional calcified plaques at the origins of the celiac and superior mesenteric arteries. No adenopathy. Reproductive: Uterus surgically absent with nonvisualization of ovaries Other: No free air or free fluid. No acute inflammatory process or hernia identified. Musculoskeletal: Osseous demineralization marked compression fracture of T8 vertebral body unchanged since another CT of 10/26/2016. IMPRESSION: Distal colonic diverticulosis without evidence of diverticulitis. Moderate-sized hiatal hernia. Stable cystic lesion at body of pancreas 8 x 12 x 14 mm. Stable bibasilar pulmonary fibrosis. Stable marked compression fracture T8 vertebral body. No acute intra-abdominal or intrapelvic abnormalities. Aortic  Atherosclerosis (ICD10-I70.0). Electronically Signed   By: Lavonia Dana M.D.   On: 12/29/2019 13:09   DG Chest Portable 1 View  Result Date: 12/29/2019 CLINICAL DATA:  Pain with nausea and vomiting EXAM: PORTABLE CHEST 1 VIEW COMPARISON:  August 10, 2019 FINDINGS: There is extensive fibrosis throughout the lungs bilaterally. There is no frank edema or consolidation. Heart is upper normal in size with pulmonary vascularity normal. No adenopathy. No bone lesions. There is aortic atherosclerosis. IMPRESSION: Extensive fibrosis throughout the lungs, stable. No edema or airspace opacity. Stable cardiac silhouette. No adenopathy. Aortic Atherosclerosis (ICD10-I70.0). Electronically Signed   By: Lowella Grip III M.D.   On: 12/29/2019 13:31   ECHOCARDIOGRAM COMPLETE  Result Date: 01/03/2020    ECHOCARDIOGRAM REPORT   Patient Name:   SYDELL PROWELL Date of Exam: 01/02/2020 Medical Rec #:  [id number]        Height:       64.0 in Accession #:    [id number]       Weight:       147.0 lb Date of Birth:  [birth year]         BSA:          1.716 m Patient Age:    63 years          BP:           144/61 mmHg Patient Gender: F                HR:           66 bpm. Exam Location:  [facility] Procedure: 2D Echo Indications:     Abnormal ECG 794.31/ R94.31  History:         Patient has prior history of Echocardiogram examinations, most                  recent 03/26/2015.  Sonographer:     Arville Go RDCS Referring Phys:  [id number] [place] Diagnosing Phys: Bartholome Bill MD IMPRESSIONS  1. Left ventricular ejection fraction, by estimation, is 60 to 65%. The left ventricle has normal function. The left ventricle has no regional wall motion abnormalities. There is mild asymmetric left ventricular hypertrophy of the basal-septal segment. Left ventricular diastolic parameters are consistent with Grade I diastolic dysfunction (impaired relaxation).  2. Right ventricular systolic function is normal. The right ventricular size is normal.  3. Left atrial size was mildly dilated.  4. Right atrial size was mildly dilated.  5. The mitral valve is grossly normal. Trivial mitral valve regurgitation.  6. The aortic valve is tricuspid. Aortic valve regurgitation is mild. No aortic stenosis is present. FINDINGS  Left Ventricle: Left ventricular ejection fraction, by estimation, is 60 to 65%. The left ventricle has normal function. The left ventricle has no regional wall motion abnormalities. The left ventricular internal cavity size was normal in size. There is  mild asymmetric left ventricular hypertrophy of the basal-septal segment. Left ventricular diastolic parameters are consistent with Grade I diastolic dysfunction (impaired relaxation). Right Ventricle: The right ventricular size is normal. No increase in right ventricular wall thickness. Right ventricular systolic function is normal. Left Atrium: Left atrial size was mildly dilated. Right Atrium: Right atrial size was mildly dilated. Pericardium: There is no evidence of pericardial effusion. Mitral Valve: The mitral valve is grossly normal. Trivial  mitral valve regurgitation. Tricuspid Valve: The tricuspid valve is normal in structure. Tricuspid valve regurgitation is mild. Aortic Valve: The aortic valve is tricuspid. Aortic valve regurgitation is mild.  Aortic regurgitation PHT measures 639 msec. No aortic stenosis is present. Aortic valve peak gradient measures 11.2 mmHg. Pulmonic Valve: The pulmonic valve was grossly normal. Pulmonic valve regurgitation is trivial. Aorta: The aortic root is normal in size and structure. IAS/Shunts: The interatrial septum was not assessed.  LEFT VENTRICLE PLAX 2D LVIDd:         4.31 cm  Diastology LVIDs:         2.89 cm  LV e' lateral:   4.68 cm/s LV PW:         1.32 cm  LV E/e' lateral: 19.5 LV IVS:        1.14 cm  LV e' medial:    4.46 cm/s LVOT diam:     1.90 cm  LV E/e' medial:  20.4 LV SV:         67 LV SV Index:   39 LVOT Area:     2.84 cm  RIGHT VENTRICLE RV Basal diam:  3.23 cm RV S prime:     16.60 cm/s TAPSE (M-mode): 2.2 cm LEFT ATRIUM             Index       RIGHT ATRIUM           Index LA diam:        3.90 cm 2.27 cm/m  RA Area:     12.80 cm LA Vol (A2C):   25.2 ml 14.68 ml/m RA Volume:   29.60 ml  17.25 ml/m LA Vol (A4C):   37.0 ml 21.56 ml/m LA Biplane Vol: 32.7 ml 19.05 ml/m  AORTIC VALVE                PULMONIC VALVE AV Area (Vmax): 1.90 cm    PV Vmax:       1.17 m/s AV Vmax:        167.00 cm/s PV Peak grad:  5.5 mmHg AV Peak Grad:   11.2 mmHg LVOT Vmax:      112.00 cm/s LVOT Vmean:     64.500 cm/s LVOT VTI:       0.235 m AI PHT:         639 msec  AORTA Ao Root diam: 2.90 cm Ao Asc diam:  2.70 cm MITRAL VALVE                TRICUSPID VALVE MV Area (PHT): 3.42 cm     TV Peak grad:   55.2 mmHg MV Decel Time: 222 msec     TV Vmax:        3.72 m/s MV E velocity: 91.20 cm/s MV A velocity: 127.00 cm/s  SHUNTS MV E/A ratio:  0.72         Systemic VTI:  0.24 m                             Systemic Diam: 1.90 cm Bartholome Bill MD Electronically signed by Bartholome Bill MD Signature Date/Time: 01/03/2020/9:14:55 AM     Final     Assessment & Plan    79 year old female with history of paroxysmal atrial fibrillation and pulmonary hypertension on tadalafil. Her A. fib is treated with sotalol 80 twice daily and Cardizem. She is on Eliquis for anticoagulation. Her QTC on presenting EKG was less than 500. She has had several runs of atrial fibrillation with rapid ventricular response. She is currently hemodynamically stableand remains in nsr.  1. Atrial fibrillation  -We will continue  with sotalol 80 twice dailyand follow ryhthm and qtc. We will continue with Cardizemand will convert back to long acting cardizem 240 daily .Would recommend potassium greater than 4and Mg greater than 2. Given breakthrough runs of tachycardia which appear to be probable recurrent A. fib with RVR.  Doubt ventricular arrhythmia.  Will discuss with the patient's electrophysiologist in the morning regarding further treatment recommendations regarding further antiarrhythmics etc.  Continue with Cardizem CD at 240 along with sotalol 80 twice daily for now.  Will check EKG today and follow QTC.  We will continue with apixaban at 5 mg twice daily.  2. Pulmonary hypertension  -Continue with tadalafil. Continue with supplemental oxygen.  3. Nausea and vomiting with diarrhea  -Improved.  Appetite better.   Signed, Javier Docker Quinnie Barcelo MD 01/03/2020, 11:47 AM  Pager: [phone]

## 2020-01-03 NOTE — Progress Notes (Signed)
PROGRESS NOTE    Julie Mcmillan  RJP:[id number] DOB: Oct 24, 1940 DOA: 12/30/2019 PCP: Kirk Ruths, MD    Brief Narrative:  Julie Mcmillan is a 79 y.o. female with history of paroxysmal atrial fibrillation, pulmonary hypertension on tadalafil who presented to the emergency room with complaints of abdominal discomfort and increased diarrhea.  Also while in the ED had A. fib RVR intermittently on Cardizem and nonsustained VT.    Consultants:   Cardiology  Procedures: CT and echo  Antimicrobials:      Subjective: No diarrhea. Had runs of A. fib RVR yesterday afternoon.  Objective: Vitals:   01/02/20 1949 01/03/20 0608 01/03/20 0808 01/03/20 1205  BP: (!) 113/52 (!) 103/46 130/65 (!) 127/55  Pulse: 60 (!) 55 60 62  Resp: _0 Temp: 98.6 F (37 C) 98.4 F (36.9 C) 97.8 F (36.6 C) 98.2 F (36.8 C)  TempSrc: Oral Oral Oral   SpO2: 97% 99% 98% 100%  Weight:  65.9 kg    Height:        Intake/Output Summary (Last 24 hours) at 01/03/2020 1317 Last data filed at 01/03/2020 1113 Gross per 24 hour  Intake 0 ml  Output 550 ml  Net -550 ml   Filed Weights   01/01/20 0351 01/02/20 0438 01/03/20 0608  Weight: 66.8 kg 66.7 kg 65.9 kg    Examination:  General exam: Appears calm and comfortable  Respiratory system: Clear to auscultation. Respiratory effort normal.  No wheezing Cardiovascular system: S1 & S2 heard, RRR. No JVD, murmurs, rubs, gallops or clicks.  Gastrointestinal system: Abdomen is nondistended, soft and nontender.  Normal bowel sounds heard. Central nervous system: Alert and oriented. No focal neurological deficits. Extremities: No edema Skin: Warm dry Psychiatry: Judgement and insight appear normal. Mood & affect appropriate.     Data Reviewed: I have personally reviewed following labs and imaging studies  CBC: Recent Labs  Lab 12/29/19 1129 12/30/19 2101 12/31/19 0454 01/01/20 0443 01/02/20 0502  WBC 10.0 10.7* 6.6  --  7.0    NEUTROABS  --  9.0*  --   --   --   HGB 10.4* 9.3* 7.9* 7.9* 7.8*  HCT 35.7* 32.6* 26.9* 27.2* 26.1*  MCV 72.4* 72.6* 72.1*  --  71.7*  PLT 245 234 183  --  076   Basic Metabolic Panel: Recent Labs  Lab 12/29/19 1129 12/29/19 1129 12/30/19 2101 12/31/19 0454 01/01/20 0443 01/02/20 0502 01/03/20 0452  NA 136  --  138 139 139  --   --   K 4.4   < > 3.8 3.7 3.6 4.4 4.9  CL 102  --  107 110 111  --   --   CO2 23  --  21* 24 23  --   --   GLUCOSE 246*  --  178* 108* 135*  --   --   BUN 20  --  _1 --   --   CREATININE 1.01*  --  0.93 0.94 0.82  --   --   CALCIUM 8.9  --  8.3* 7.7* 7.9*  --   --   MG  --   --   --  1.8  --  1.9  --    < > = values in this interval not displayed.   GFR: Estimated Creatinine Clearance: 52 mL/min (by C-G formula based on SCr of 0.82 mg/dL). Liver Function Tests: Recent Labs  Lab 12/29/19 1129 12/30/19 2101  AST  25 17  ALT 19 17  ALKPHOS 66 60  BILITOT 0.6 0.5  PROT 6.1* 5.9*  ALBUMIN 3.5 3.5   Recent Labs  Lab 12/29/19 1129 12/30/19 2101  LIPASE 15 20   No results for input(s): AMMONIA in the last 168 hours. Coagulation Profile: No results for input(s): INR, PROTIME in the last 168 hours. Cardiac Enzymes: No results for input(s): CKTOTAL, CKMB, CKMBINDEX, TROPONINI in the last 168 hours. BNP (last 3 results) No results for input(s): PROBNP in the last 8760 hours. HbA1C: No results for input(s): HGBA1C in the last 72 hours. CBG: No results for input(s): GLUCAP in the last 168 hours. Lipid Profile: No results for input(s): CHOL, HDL, LDLCALC, TRIG, CHOLHDL, LDLDIRECT in the last 72 hours. Thyroid Function Tests: No results for input(s): TSH, T4TOTAL, FREET4, T3FREE, THYROIDAB in the last 72 hours. Anemia Panel: Recent Labs    12/31/19 1555 01/01/20 0443  VITAMINB12 217  --   FERRITIN  --  9*  TIBC  --  335  IRON  --  10*   Sepsis Labs: No results for input(s): PROCALCITON, LATICACIDVEN in the last 168  hours.  Recent Results (from the past 240 hour(s))  C Difficile Quick Screen w PCR reflex     Status: None   Collection Time: 12/31/19 12:29 AM   Specimen: Stool  Result Value Ref Range Status   C Diff antigen NEGATIVE NEGATIVE Final   C Diff toxin NEGATIVE NEGATIVE Final   C Diff interpretation No C. difficile detected.  Final    Comment: Performed at [facility], [address] [place], [place] [address]  Respiratory Panel by RT PCR (Flu A&B, Covid) - Nasopharyngeal Swab     Status: None   Collection Time: 12/31/19 12:29 AM   Specimen: Nasopharyngeal Swab  Result Value Ref Range Status   SARS Coronavirus 2 by RT PCR NEGATIVE NEGATIVE Final    Comment: (NOTE) SARS-CoV-2 target nucleic acids are NOT DETECTED. The SARS-CoV-2 RNA is generally detectable in upper respiratoy specimens during the acute phase of infection. The lowest concentration of SARS-CoV-2 viral copies this assay can detect is 131 copies/mL. A negative result does not preclude SARS-Cov-2 infection and should not be used as the sole basis for treatment or other patient management decisions. A negative result may occur with  improper specimen collection/handling, submission of specimen other than nasopharyngeal swab, presence of viral mutation(s) within the areas targeted by this assay, and inadequate number of viral copies (<131 copies/mL). A negative result must be combined with clinical observations, patient history, and epidemiological information. The expected result is Negative. Fact Sheet for Patients:  PinkCheek.be Fact Sheet for Healthcare Providers:  GravelBags.it This test is not yet ap proved or cleared by the [place] FDA and  has been authorized for detection and/or diagnosis of SARS-CoV-2 by FDA under an Emergency Use Authorization (EUA). This EUA will remain  in effect (meaning this test can be used) for the duration of  the COVID-19 declaration under Section 564(b)(1) of the Act, 21 U.S.C. section 360bbb-3(b)(1), unless the authorization is terminated or revoked sooner.    Influenza A by PCR NEGATIVE NEGATIVE Final   Influenza B by PCR NEGATIVE NEGATIVE Final    Comment: (NOTE) The Xpert Xpress SARS-CoV-2/FLU/RSV assay is intended as an aid in  the diagnosis of influenza from Nasopharyngeal swab specimens and  should not be used as a sole basis for treatment. Nasal washings and  aspirates are unacceptable for Xpert Xpress SARS-CoV-2/FLU/RSV  testing. Fact Sheet for Patients: PinkCheek.be Fact Sheet for Healthcare Providers: GravelBags.it This test is not yet approved or cleared by the [place] FDA and  has been authorized for detection and/or diagnosis of SARS-CoV-2 by  FDA under an Emergency Use Authorization (EUA). This EUA will remain  in effect (meaning this test can be used) for the duration of the  Covid-19 declaration under Section 564(b)(1) of the Act, 21  U.S.C. section 360bbb-3(b)(1), unless the authorization is  terminated or revoked. Performed at [facility], [address] [place], [place] [address]          Radiology Studies: ECHOCARDIOGRAM COMPLETE  Result Date: 01/03/2020    ECHOCARDIOGRAM REPORT   Patient Name:   Julie Mcmillan Date of Exam: 01/02/2020 Medical Rec #:  [id number]        Height:       64.0 in Accession #:    [id number]       Weight:       147.0 lb Date of Birth:  [birth year]         BSA:          1.716 m Patient Age:    26 years         BP:           144/61 mmHg Patient Gender: F                HR:           66 bpm. Exam Location:  [facility] Procedure: 2D Echo Indications:     Abnormal ECG 794.31/ R94.31  History:         Patient has prior history of Echocardiogram examinations, most                  recent 03/26/2015.  Sonographer:     Arville Go RDCS Referring Phys:  [id number] [place]  Diagnosing Phys: Bartholome Bill MD IMPRESSIONS  1. Left ventricular ejection fraction, by estimation, is 60 to 65%. The left ventricle has normal function. The left ventricle has no regional wall motion abnormalities. There is mild asymmetric left ventricular hypertrophy of the basal-septal segment. Left ventricular diastolic parameters are consistent with Grade I diastolic dysfunction (impaired relaxation).  2. Right ventricular systolic function is normal. The right ventricular size is normal.  3. Left atrial size was mildly dilated.  4. Right atrial size was mildly dilated.  5. The mitral valve is grossly normal. Trivial mitral valve regurgitation.  6. The aortic valve is tricuspid. Aortic valve regurgitation is mild. No aortic stenosis is present. FINDINGS  Left Ventricle: Left ventricular ejection fraction, by estimation, is 60 to 65%. The left ventricle has normal function. The left ventricle has no regional wall motion abnormalities. The left ventricular internal cavity size was normal in size. There is  mild asymmetric left ventricular hypertrophy of the basal-septal segment. Left ventricular diastolic parameters are consistent with Grade I diastolic dysfunction (impaired relaxation). Right Ventricle: The right ventricular size is normal. No increase in right ventricular wall thickness. Right ventricular systolic function is normal. Left Atrium: Left atrial size was mildly dilated. Right Atrium: Right atrial size was mildly dilated. Pericardium: There is no evidence of pericardial effusion. Mitral Valve: The mitral valve is grossly normal. Trivial mitral valve regurgitation. Tricuspid Valve: The tricuspid valve is normal in structure. Tricuspid valve regurgitation is mild. Aortic Valve: The aortic valve is tricuspid. Aortic valve regurgitation is mild. Aortic regurgitation PHT measures 639 msec. No aortic stenosis is  present. Aortic valve peak gradient measures 11.2 mmHg. Pulmonic Valve: The pulmonic valve was  grossly normal. Pulmonic valve regurgitation is trivial. Aorta: The aortic root is normal in size and structure. IAS/Shunts: The interatrial septum was not assessed.  LEFT VENTRICLE PLAX 2D LVIDd:         4.31 cm  Diastology LVIDs:         2.89 cm  LV e' lateral:   4.68 cm/s LV PW:         1.32 cm  LV E/e' lateral: 19.5 LV IVS:        1.14 cm  LV e' medial:    4.46 cm/s LVOT diam:     1.90 cm  LV E/e' medial:  20.4 LV SV:         67 LV SV Index:   39 LVOT Area:     2.84 cm  RIGHT VENTRICLE RV Basal diam:  3.23 cm RV S prime:     16.60 cm/s TAPSE (M-mode): 2.2 cm LEFT ATRIUM             Index       RIGHT ATRIUM           Index LA diam:        3.90 cm 2.27 cm/m  RA Area:     12.80 cm LA Vol (A2C):   25.2 ml 14.68 ml/m RA Volume:   29.60 ml  17.25 ml/m LA Vol (A4C):   37.0 ml 21.56 ml/m LA Biplane Vol: 32.7 ml 19.05 ml/m  AORTIC VALVE                PULMONIC VALVE AV Area (Vmax): 1.90 cm    PV Vmax:       1.17 m/s AV Vmax:        167.00 cm/s PV Peak grad:  5.5 mmHg AV Peak Grad:   11.2 mmHg LVOT Vmax:      112.00 cm/s LVOT Vmean:     64.500 cm/s LVOT VTI:       0.235 m AI PHT:         639 msec  AORTA Ao Root diam: 2.90 cm Ao Asc diam:  2.70 cm MITRAL VALVE                TRICUSPID VALVE MV Area (PHT): 3.42 cm     TV Peak grad:   55.2 mmHg MV Decel Time: 222 msec     TV Vmax:        3.72 m/s MV E velocity: 91.20 cm/s MV A velocity: 127.00 cm/s  SHUNTS MV E/A ratio:  0.72         Systemic VTI:  0.24 m                             Systemic Diam: 1.90 cm Bartholome Bill MD Electronically signed by Bartholome Bill MD Signature Date/Time: 01/03/2020/9:14:55 AM    Final         Scheduled Meds: . apixaban  5 mg Oral BID  . busPIRone  7.5 mg Oral BID  . diltiazem  240 mg Oral Daily  . ferrous sulfate  325 mg Oral Q breakfast  . FLUoxetine  40 mg Oral Daily  . furosemide  20 mg Oral Daily  . levothyroxine  75 mcg Oral QAC breakfast  . off the beat book   Does not apply Once  . potassium chloride  20 mEq Oral  BID   . predniSONE  10 mg Oral Q breakfast  . sodium chloride flush  3 mL Intravenous Q12H  . sotalol  80 mg Oral Q12H  . tadalafil (PAH)  40 mg Oral QHS  . traZODone  50 mg Oral QHS   Continuous Infusions:   Assessment & Plan:   Principal Problem:   Gastroenteritis Active Problems:   Type 2 diabetes mellitus (HCC)   Anemia, iron deficiency   Adult hypothyroidism   Hypertension associated with diabetes ([place])   PAF (paroxysmal atrial fibrillation) (HCC)   Interstitial lung disease (HCC)  Gastroenteritis: With persistent nausea, vomiting, and diarrhea.  CT abdomen/pelvis with contrast on 12/29/2019 without acute etiology.  Her diarrhea has resolved. -Follow-up C. 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 -Continue prednisone 10 mg daily -Holding azathioprine for now as above -Continue supplemental oxygen, maintaining well on home 4 L O2 via [place]  Paroxysmal atrial fibrillation: With intermittent RVR.  Has been on IV Cardizem as needed when she goes into RVR  Now in sinus rhythm.  Will go back on long-acting Cardizem 240 daily. Potassium and magnesium stable Continue sotalol Had nonsustained VT previously. Cards following, discussed management since she continues having intermittent RVR.  Dr. Ubaldo Glassing will discuss with EP tomorrow about different options for better rate control.  May need to discuss A. fib ablation?   IV magnesium 2 g was given previously.    Microcytic anemia: H&H slowly has trending down, has received IV fluids could be dilutional.  Stool occult negative.  Iron studies correlate with iron defic. Anemia- Transfused iv iron yesterday, will repeat today.  Told patient she needs to follow-up with PCP for outpatient work-up.       Pulmonary  hypertension: -Continue tadalafil Early volume overloaded likely due to A. fib RVR.   Continue lasix  Hypertension: Currently stable.  Continue diltiazem and sotalol as above.  Hypothyroidism: TSH 1.87 on 11/03/2019.  Continue Synthroid.  Diet-controlled type 2 diabetes: Continue to monitor.  Depression/anxiety: Continue fluoxetine and BuSpar.  DVT prophylaxis: Eliquis Code Status: Full  Family Communication: none at bedside Disposition Plan: From home, likely discharge to home  Barrier: If no further A. fib RVR can consider d/c , but still with afib rvr    LOS: 3 days   Time spent: 45 minutes or more than 50% COC    Nolberto Hanlon, MD [facility] Pager [phone]  If 7PM-7AM, please contact night-coverage [email or web address] Password [email or web address] 01/03/2020, 1:17 PM Patient ID: Julie Mcmillan, female   DOB: 1941-06-21, 79 y.o.   MRN: [id number]

## 2020-01-03 NOTE — TOC Initial Note (Signed)
Transition of Care [facility]) - Initial/Assessment Note    Patient Details  Name: Julie Mcmillan MRN: [id number] Date of Birth: 1941/04/10  Transition of [facility]) CM/SW Contact:    Julie Mcmillan [place], [place] Phone Number: 01/03/2020, 2:49 PM  Clinical Narrative:                   Expected Discharge Plan: Home w Home Health Services Barriers to Discharge: Other (comment)(patient/son both agree with long term care)   Patient Goals and CMS Choice     Julie Mcmillan a 79 y.o.female who presented to the emergency room with complaints of abdominal discomfort and increased diarrhea. This Education officer, museum met with patient and patient's son Julie Mcmillan at bedside. Patient explained that he feels that patient needs long term care due to patient's growing medical needs and  Increased need for supervision to assist with ADL's and IADL's. Patient's son in process of applying for long term care medicaid and would like a Fl2 completed to support this. Patient has a wheelchair, toilet seat and walker at home. This Education officer, museum discussed the Long term Medicaid approval process and discussed probability that patient will likely need to return home with [facility] services.  Per patient's son, he was previously set up with [facility] but has not received a call to set up services from them to date. This social worker called and spoke to [facility]. Need for new referral likely. Case Management to continue to follow patient to   assess for discharge needs.  Julie Klimowicz, LCSW Clinical Social Work [phone]   Expected Discharge Plan and Services Expected Discharge Plan: [place] In-house Referral: Clinical Social Work Discharge Planning Services: Other - See comment(extra help)   Living arrangements for the past 2 months: Apartment                                      Prior Living Arrangements/Services Living arrangements for the past 2 months:  Apartment Lives with:: Self Patient language and need for interpreter reviewed:: Yes Do you feel safe going back to the place where you live?: Yes      Need for Family Participation in Patient Care: Yes (Comment)   Current home services: Other (comment)(wheelchair, raised toilet seat, walker) Criminal Activity/Legal Involvement Pertinent to Current Situation/Hospitalization: No - Comment as needed  Activities of Daily Living Home Assistive Devices/Equipment: Walker (specify type), Wheelchair, Oxygen ADL Screening (condition at time of admission) Patient's cognitive ability adequate to safely complete daily activities?: Yes Is the patient deaf or have difficulty hearing?: No Does the patient have difficulty seeing, even when wearing glasses/contacts?: No Does the patient have difficulty concentrating, remembering, or making decisions?: No Patient able to express need for assistance with ADLs?: Yes Does the patient have difficulty dressing or bathing?: No Independently performs ADLs?: Yes (appropriate for developmental age) Does the patient have difficulty walking or climbing stairs?: No Weakness of Legs: Left Weakness of Arms/Hands: None  Permission Sought/Granted   Permission granted to share information with : Yes, Verbal Permission Granted        Permission granted to share info w Relationship: Duncan-son  Permission granted to share info w Contact Information: [phone]  Emotional Assessment Appearance:: Appears stated age Attitude/Demeanor/Rapport: Engaged Affect (typically observed): Accepting, Adaptable Orientation: : Oriented to Self, Oriented to Place, Oriented to  Time, Oriented to Situation Alcohol / Substance Use: Not  Applicable Psych Involvement: No (comment)  Admission diagnosis:  Gastroenteritis [K52.9] Acute cystitis without hematuria [N30.00] Patient Active Problem List   Diagnosis Date Noted  . Gastroenteritis 12/30/2019  . Closed left hip fracture  ([place]) 11/07/2018  . Enterovesical fistula   . Acute diverticulitis 08/12/2017  . Colonic diverticular abscess   . Dyspnea 10/27/2016  . Interstitial lung disease ([place]) 08/31/2016  . Depression, major, single episode, complete remission ([place]) 02/23/2016  . DOE (dyspnea on exertion) 01/26/2016  . Acute respiratory failure with hypoxia ([place]) 11/29/2015  . Fracture of greater trochanter of right femur ([place]) 11/29/2015  . Fall 11/26/2015  . OP (osteoporosis) 07/18/2015  . 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 Adult hypothyroidism 07/18/2015  . Fatigue 07/18/2015  . Anxiety 07/18/2015  . Diverticulitis large intestine 07/04/2015  . Atrial fibrillation with rapid ventricular response ([place]) 03/25/2015  . PAF (paroxysmal atrial fibrillation) ([place]) 03/25/2015  . Acquired hypothyroidism 01/11/2015  . Anemia, iron deficiency 01/10/2015  . Type 2 diabetes mellitus ([place]) 11/04/2014  . 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 Hypertension associated with diabetes ([place]) 11/04/2014  . Temporary cerebral vascular dysfunction 04/08/2014  . Dry mouth 01/05/2013  . Cannot sleep 01/05/2013  . Obstructive apnea 09/02/2012  . Bursitis, ischial 05/19/2012  . GERD (gastroesophageal reflux disease) 02/01/2012  . DD (diverticular disease) 08/14/2011   PCP:  Kirk Ruths, MD Pharmacy:   [facility] [address] ([address]), [place] [address] [place]) [place] [address] Phone: [phone] Fax: [phone]     Social Determinants of Health (SDOH) Interventions    Readmission Risk Interventions No flowsheet data found.

## 2020-01-04 DIAGNOSIS — K529 Noninfective gastroenteritis and colitis, unspecified: Secondary | ICD-10-CM | POA: Diagnosis not present

## 2020-01-04 LAB — GI PATHOGEN PANEL BY PCR, STOOL

## 2020-01-04 LAB — BASIC METABOLIC PANEL
Anion gap: 8 (ref 5–15)
BUN: 19 mg/dL (ref 8–23)
CO2: 29 mmol/L (ref 22–32)
Calcium: 8.8 mg/dL — ABNORMAL LOW (ref 8.9–10.3)
Chloride: 100 mmol/L (ref 98–111)
Creatinine, Ser: 0.89 mg/dL (ref 0.44–1.00)
GFR calc Af Amer: >60 mL/min (ref >60)
GFR calc non Af Amer: >60 mL/min (ref >60)
Glucose, Bld: 108 mg/dL — ABNORMAL HIGH (ref 70–99)
Potassium: 4.5 mmol/L (ref 3.5–5.1)
Sodium: 137 mmol/L (ref 135–145)

## 2020-01-04 NOTE — Progress Notes (Signed)
Physical Therapy Treatment Patient Details Name: Julie Mcmillan MRN: [id number] DOB: 1941/05/14 Today's Date: 01/04/2020    History of Present Illness 79 y.o. female with history of paroxysmal atrial fibrillation, pulmonary hypertension on tadalafil who presented to the emergency room with complaints of abdominal discomfort and increased diarrhea.  She has had a problem with this in the past.  She is being followed at [facility] for her paroxysmal A. fib. PLOF ind without AD    PT Comments    Pt is making good progress towards goals with ability to ambulate further in room this date with seated rest break. Good endurance with there-ex. Vitals assessed with all mobility. Pt motivated to perform therapy, however feels she is going down hill regarding her medical status. Educated on benefits of remaining active. Will continue to progress as able.   Follow Up Recommendations  Home health PT     Equipment Recommendations  Rolling walker with 5" wheels    Recommendations for Other Services       Precautions / Restrictions Precautions Precautions: Fall Restrictions Weight Bearing Restrictions: No    Mobility  Bed Mobility Overal bed mobility: Needs Assistance Bed Mobility: Supine to Sit     Supine to sit: Min guard     General bed mobility comments: safe technique with upright posture  Transfers Overall transfer level: Needs assistance Equipment used: Rolling walker (2 wheeled) Transfers: Sit to/from Stand Sit to Stand: Min guard         General transfer comment: UE use  Ambulation/Gait Ambulation/Gait assistance: Min guard Gait Distance (Feet): 80 Feet Assistive device: Rolling walker (2 wheeled) Gait Pattern/deviations: Step-through pattern     General Gait Details: ambulated 2 laps in room with seated rest break in between bout. Reciprocal gait pattern. Monitored vitals with HR ranging 66-76bpm with exertion and O2 sats WNL on 4L of O2.  Fatigues with increased distances with slight unsteadiness noted during turns, no physical assist required   Stairs             Wheelchair Mobility    Modified Rankin (Stroke Patients Only)       Balance Overall balance assessment: Needs assistance Sitting-balance support: Feet supported Sitting balance-Leahy Scale: Good     Standing balance support: No upper extremity supported;During functional activity Standing balance-Leahy Scale: Good                              Cognition Arousal/Alertness: Awake/alert Behavior During Therapy: WFL for tasks assessed/performed Overall Cognitive Status: Within Functional Limits for tasks assessed                                 General Comments: sleeping upon arrival, however easily awakens      Exercises Other Exercises Other Exercises: supine ther-ex including B LE marching, bridging, heel slides and SLRs. All ther-ex performed x 10 reps with supervision. Vitals assessed Other Exercises: Pt transitioned at end of session to EOB for meal    General Comments        Pertinent Vitals/Pain Pain Assessment: No/denies pain    Home Living                      Prior Function            PT Goals (current goals can now be found in the care  plan section) Acute Rehab PT Goals Patient Stated Goal: go home PT Goal Formulation: With patient Time For Goal Achievement: 01/16/20 Potential to Achieve Goals: Fair Progress towards PT goals: Progressing toward goals    Frequency    Min 2X/week      PT Plan Current plan remains appropriate    Co-evaluation              AM-PAC PT "6 Clicks" Mobility   Outcome Measure  Help needed turning from your back to your side while in a flat bed without using bedrails?: None Help needed moving from lying on your back to sitting on the side of a flat bed without using bedrails?: None Help needed moving to and from a bed to a chair (including a  wheelchair)?: None Help needed standing up from a chair using your arms (e.g., wheelchair or bedside chair)?: None Help needed to walk in hospital room?: A Little Help needed climbing 3-5 steps with a railing? : A Lot 6 Click Score: 21    End of Session Equipment Utilized During Treatment: Gait belt Activity Tolerance: Patient tolerated treatment well Patient left: in bed;with bed alarm set(seated at EOB to eat meal) Nurse Communication: Mobility status PT Visit Diagnosis: Other abnormalities of gait and mobility (R26.89);Unsteadiness on feet (R26.81);Muscle weakness (generalized) (M62.81)     Time: 1102-1117 PT Time Calculation (min) (ACUTE ONLY): 39 min  Charges:  $Gait Training: 23-37 mins $Therapeutic Exercise: 8-22 mins                     Greggory Stallion, PT, DPT [phone]    Julie Mcmillan 01/04/2020, 5:32 PM

## 2020-01-04 NOTE — Progress Notes (Signed)
Patient Name: Julie Mcmillan Date of Encounter: 01/04/2020  Hospital Problem List     Principal Problem:   Gastroenteritis Active Problems:   Type 2 diabetes mellitus (HCC)   Anemia, iron deficiency   Adult hypothyroidism   Hypertension associated with diabetes ([place])   PAF (paroxysmal atrial fibrillation) ([place])   Interstitial lung disease ([place])    Patient Profile     79 y.o.femalewith history ofparoxysmal atrial fibrillation, pulmonary hypertension on tadalafil who presented to the emergency room with complaints of abdominal discomfort and increased diarrhea. She has had a problem with this in the past. She is being followed at [facility] for her paroxysmal A. fib and has been on sotalol 80 mg twice daily for this along with Eliquis for anticoagulation and diltiazem. She states she has breakthrough A. fib 3-4 times a month. She had atrial fibrillation episodes with rates of 144. She denies chest pain or change in her breathing pattern. She denies syncope or presyncope. Laboratories on admission showed a magnesium 1.8. Potassium was 3.7.   Subjective   No complaints  Inpatient Medications    . apixaban  5 mg Oral BID  . busPIRone  7.5 mg Oral BID  . diltiazem  240 mg Oral Daily  . ferrous sulfate  325 mg Oral Q breakfast  . FLUoxetine  40 mg Oral Daily  . furosemide  20 mg Oral Daily  . levothyroxine  75 mcg Oral QAC breakfast  . off the beat book   Does not apply Once  . potassium chloride  20 mEq Oral BID  . predniSONE  10 mg Oral Q breakfast  . sodium chloride flush  3 mL Intravenous Q12H  . tadalafil (PAH)  40 mg Oral QHS  . traZODone  50 mg Oral QHS    Vital Signs    Vitals:   01/03/20 2102 01/04/20 0415 01/04/20 0742 01/04/20 0856  BP: (!) 150/68 (!) 116/45 (!) 104/50   Pulse: 73 (!) 52 (!) 56 65  Resp: _0 Temp: 97.8 F (36.6 C) 97.9 F (36.6 C) 98.2 F (36.8 C)   TempSrc: Oral Oral Oral   SpO2: 97% 98% 100%    Weight:  66 kg    Height:        Intake/Output Summary (Last 24 hours) at 01/04/2020 1111 Last data filed at 01/04/2020 0940 Gross per 24 hour  Intake 240 ml  Output 450 ml  Net -210 ml   Filed Weights   01/02/20 0438 01/03/20 0608 01/04/20 0415  Weight: 66.7 kg 65.9 kg 66 kg    Physical Exam    GEN: Well nourished, well developed, in no acute distress.  HEENT: normal.  Neck: Supple, no JVD, carotid bruits, or masses. Cardiac: RRR, no murmurs, rubs, or gallops. No clubbing, cyanosis, edema.  Radials/DP/PT 2+ and equal bilaterally.  Respiratory:  Respirations regular and unlabored, clear to auscultation bilaterally. GI: Soft, nontender, nondistended, BS + x 4. MS: no deformity or atrophy. Skin: warm and dry, no rash. Neuro:  Strength and sensation are intact. Psych: Normal affect.  Labs    CBC Recent Labs    01/02/20 0502  WBC 7.0  HGB 7.8*  HCT 26.1*  MCV 71.7*  PLT 297   Basic Metabolic Panel Recent Labs    01/02/20 0502 01/02/20 0502 01/03/20 0452 01/04/20 0528  NA  --   --   --  137  K 4.4   < > 4.9 4.5  CL  --   --   --  100  CO2  --   --   --  29  GLUCOSE  --   --   --  108*  BUN  --   --   --  19  CREATININE  --   --   --  0.89  CALCIUM  --   --   --  8.8*  MG 1.9  --   --   --    < > = values in this interval not displayed.   Liver Function Tests No results for input(s): AST, ALT, ALKPHOS, BILITOT, PROT, ALBUMIN in the last 72 hours. No results for input(s): LIPASE, AMYLASE in the last 72 hours. Cardiac Enzymes No results for input(s): CKTOTAL, CKMB, CKMBINDEX, TROPONINI in the last 72 hours. BNP Recent Labs    01/02/20 0502  BNP 313.0*   D-Dimer No results for input(s): DDIMER in the last 72 hours. Hemoglobin A1C No results for input(s): HGBA1C in the last 72 hours. Fasting Lipid Panel No results for input(s): CHOL, HDL, LDLCALC, TRIG, CHOLHDL, LDLDIRECT in the last 72 hours. Thyroid Function Tests No results for input(s): TSH,  T4TOTAL, T3FREE, THYROIDAB in the last 72 hours.  Invalid input(s): FREET3  Telemetry    Sinus rhythm with intermittent atrial flutter  ECG    Atrial flutter with RVR with intermittent sinus rhythm  Radiology    CT ABDOMEN PELVIS W CONTRAST  Result Date: 12/29/2019 CLINICAL DATA:  Nausea, vomiting and diarrhea since last night, intermittent abdominal pain rated 4/10, history of type II diabetes mellitus, atrial fibrillation, diverticulitis, GERD, hypertension, irritable bowel syndrome, pulmonary hypertension, pulmonary fibrosis EXAM: CT ABDOMEN AND PELVIS WITH CONTRAST TECHNIQUE: Multidetector CT imaging of the abdomen and pelvis was performed using the standard protocol following bolus administration of intravenous contrast. Sagittal and coronal MPR images reconstructed from axial data set. CONTRAST:  18m OMNIPAQUE IOHEXOL 300 MG/ML SOLN IV. No oral contrast. COMPARISON:  09/04/2019 FINDINGS: Lower chest: Bibasilar pulmonary fibrosis unchanged. Hepatobiliary: Contracted gallbladder. Liver unremarkable Pancreas: Hypoechoic nodule at body of pancreas, 8 x 12 mm, grossly unchanged. No additional pancreatic abnormalities. Spleen: Several tiny nonspecific low-attenuation foci within spleen, nonspecific. Adrenals/Urinary Tract: Adrenal glands normal appearance. Cortical scarring and small cysts within both kidneys. No enhancing renal mass, hydronephrosis or urinary tract calcification. Bladder and ureters unremarkable. Stomach/Bowel: Low lying cecum in pelvis. Appendix surgically absent by history. Distal colonic diverticulosis without evidence of diverticulitis. Moderate-sized hiatal hernia. Vascular/Lymphatic: Scattered atherosclerotic calcifications aorta and iliac arteries without aneurysm. Additional calcified plaques at the origins of the celiac and superior mesenteric arteries. No adenopathy. Reproductive: Uterus surgically absent with nonvisualization of ovaries Other: No free air or free fluid.  No acute inflammatory process or hernia identified. Musculoskeletal: Osseous demineralization marked compression fracture of T8 vertebral body unchanged since another CT of 10/26/2016. IMPRESSION: Distal colonic diverticulosis without evidence of diverticulitis. Moderate-sized hiatal hernia. Stable cystic lesion at body of pancreas 8 x 12 x 14 mm. Stable bibasilar pulmonary fibrosis. Stable marked compression fracture T8 vertebral body. No acute intra-abdominal or intrapelvic abnormalities. Aortic Atherosclerosis (ICD10-I70.0). Electronically Signed   By: MLavonia DanaM.D.   On: 12/29/2019 13:09   DG Chest Portable 1 View  Result Date: 12/29/2019 CLINICAL DATA:  Pain with nausea and vomiting EXAM: PORTABLE CHEST 1 VIEW COMPARISON:  August 10, 2019 FINDINGS: There is extensive fibrosis throughout the lungs bilaterally. There is no frank edema or consolidation. Heart is upper normal in size with pulmonary  vascularity normal. No adenopathy. No bone lesions. There is aortic atherosclerosis. IMPRESSION: Extensive fibrosis throughout the lungs, stable. No edema or airspace opacity. Stable cardiac silhouette. No adenopathy. Aortic Atherosclerosis (ICD10-I70.0). Electronically Signed   By: Lowella Grip III M.D.   On: 12/29/2019 13:31   ECHOCARDIOGRAM COMPLETE  Result Date: 01/03/2020    ECHOCARDIOGRAM REPORT   Patient Name:   ALEXSYS ESKIN Date of Exam: 01/02/2020 Medical Rec #:  [id number]        Height:       64.0 in Accession #:    [id number]       Weight:       147.0 lb Date of Birth:  September 18, 1941         BSA:          1.716 m Patient Age:    49 years         BP:           144/61 mmHg Patient Gender: F                HR:           66 bpm. Exam Location:  [facility] Procedure: 2D Echo Indications:     Abnormal ECG 794.31/ R94.31  History:         Patient has prior history of Echocardiogram examinations, most                  recent 03/26/2015.  Sonographer:     Arville Go RDCS Referring Phys:  [id number] [place]  Diagnosing Phys: Bartholome Bill MD IMPRESSIONS  1. Left ventricular ejection fraction, by estimation, is 60 to 65%. The left ventricle has normal function. The left ventricle has no regional wall motion abnormalities. There is mild asymmetric left ventricular hypertrophy of the basal-septal segment. Left ventricular diastolic parameters are consistent with Grade I diastolic dysfunction (impaired relaxation).  2. Right ventricular systolic function is normal. The right ventricular size is normal.  3. Left atrial size was mildly dilated.  4. Right atrial size was mildly dilated.  5. The mitral valve is grossly normal. Trivial mitral valve regurgitation.  6. The aortic valve is tricuspid. Aortic valve regurgitation is mild. No aortic stenosis is present. FINDINGS  Left Ventricle: Left ventricular ejection fraction, by estimation, is 60 to 65%. The left ventricle has normal function. The left ventricle has no regional wall motion abnormalities. The left ventricular internal cavity size was normal in size. There is  mild asymmetric left ventricular hypertrophy of the basal-septal segment. Left ventricular diastolic parameters are consistent with Grade I diastolic dysfunction (impaired relaxation). Right Ventricle: The right ventricular size is normal. No increase in right ventricular wall thickness. Right ventricular systolic function is normal. Left Atrium: Left atrial size was mildly dilated. Right Atrium: Right atrial size was mildly dilated. Pericardium: There is no evidence of pericardial effusion. Mitral Valve: The mitral valve is grossly normal. Trivial mitral valve regurgitation. Tricuspid Valve: The tricuspid valve is normal in structure. Tricuspid valve regurgitation is mild. Aortic Valve: The aortic valve is tricuspid. Aortic valve regurgitation is mild. Aortic regurgitation PHT measures 639 msec. No aortic stenosis is present. Aortic valve peak gradient measures 11.2 mmHg. Pulmonic Valve: The pulmonic valve was  grossly normal. Pulmonic valve regurgitation is trivial. Aorta: The aortic root is normal in size and structure. IAS/Shunts: The interatrial septum was not assessed.  LEFT VENTRICLE PLAX 2D LVIDd:         4.31 cm  Diastology LVIDs:  2.89 cm  LV e' lateral:   4.68 cm/s LV PW:         1.32 cm  LV E/e' lateral: 19.5 LV IVS:        1.14 cm  LV e' medial:    4.46 cm/s LVOT diam:     1.90 cm  LV E/e' medial:  20.4 LV SV:         67 LV SV Index:   39 LVOT Area:     2.84 cm  RIGHT VENTRICLE RV Basal diam:  3.23 cm RV S prime:     16.60 cm/s TAPSE (M-mode): 2.2 cm LEFT ATRIUM             Index       RIGHT ATRIUM           Index LA diam:        3.90 cm 2.27 cm/m  RA Area:     12.80 cm LA Vol (A2C):   25.2 ml 14.68 ml/m RA Volume:   29.60 ml  17.25 ml/m LA Vol (A4C):   37.0 ml 21.56 ml/m LA Biplane Vol: 32.7 ml 19.05 ml/m  AORTIC VALVE                PULMONIC VALVE AV Area (Vmax): 1.90 cm    PV Vmax:       1.17 m/s AV Vmax:        167.00 cm/s PV Peak grad:  5.5 mmHg AV Peak Grad:   11.2 mmHg LVOT Vmax:      112.00 cm/s LVOT Vmean:     64.500 cm/s LVOT VTI:       0.235 m AI PHT:         639 msec  AORTA Ao Root diam: 2.90 cm Ao Asc diam:  2.70 cm MITRAL VALVE                TRICUSPID VALVE MV Area (PHT): 3.42 cm     TV Peak grad:   55.2 mmHg MV Decel Time: 222 msec     TV Vmax:        3.72 m/s MV E velocity: 91.20 cm/s MV A velocity: 127.00 cm/s  SHUNTS MV E/A ratio:  0.72         Systemic VTI:  0.24 m                             Systemic Diam: 1.90 cm Bartholome Bill MD Electronically signed by Bartholome Bill MD Signature Date/Time: 01/03/2020/9:14:55 AM    Final     Assessment & Plan     Atrial flutter-discussed with [facility] electrophysiology.  Will attempt to try Multaq 400 mg twice daily.  Will need 3 days of sotalol washout.  Have discontinued sotalol and will control rhythm and rate with Cardizem and metoprolol for now and add Multaq after washout..  Consideration for amiodarone could be raised as well however  interstitial lung disease would make this less ideal.  If this fails, consideration for ablation.  Signed, Javier Docker Mariusz Jubb MD 01/04/2020, 11:11 AM  Pager: [phone]

## 2020-01-04 NOTE — Care Management Important Message (Signed)
Important Message  Patient Details  Name: Julie Mcmillan MRN: [id number] Date of Birth: [birth year]   Medicare Important Message Given:  Yes     Dannette Barbara 01/04/2020, 12:11 PM

## 2020-01-04 NOTE — Progress Notes (Signed)
CCMD report 9 beats of v.tach on tele/ pt asymptomatic/ MD made aware/ will monitor.

## 2020-01-04 NOTE — Progress Notes (Signed)
PROGRESS NOTE    [name]  WUJ:[id number] DOB: [birth year] DOA: 12/30/2019 PCP: Kirk Ruths, MD    Brief Narrative:  Julie Mcmillan is a 79 y.o. female with history of paroxysmal atrial fibrillation, pulmonary hypertension on tadalafil who presented to the emergency room with complaints of abdominal discomfort and increased diarrhea.  Also while in the ED had A. fib RVR intermittently on Cardizem and nonsustained VT.    Consultants:   Cardiology  Procedures: CT and echo  Antimicrobials:      Subjective: In sr on tele. Feels tired. No other complaints. Objective: Vitals:   01/04/20 0415 01/04/20 0742 01/04/20 0856 01/04/20 1300  BP: (!) 116/45 (!) 104/50  (!) 128/59  Pulse: (!) 52 (!) 56 65 (!) 56  Resp: _0 Temp: 97.9 F (36.6 C) 98.2 F (36.8 C)  97.7 F (36.5 C)  TempSrc: Oral Oral    SpO2: 98% 100%  100%  Weight: 66 kg     Height:        Intake/Output Summary (Last 24 hours) at 01/04/2020 1543 Last data filed at 01/04/2020 1444 Gross per 24 hour  Intake 720 ml  Output 1000 ml  Net -280 ml   Filed Weights   01/02/20 0438 01/03/20 0608 01/04/20 0415  Weight: 66.7 kg 65.9 kg 66 kg    Examination:  General exam: Appears calm and comfortable, laying flat in bed Respiratory system: Clear to auscultation. Respiratory effort normal.  No wheezing or rhonchi Cardiovascular system: S1 & S2 heard, RRR. No JVD, murmurs, rubs, gallops or clicks.  Gastrointestinal system: Abdomen is nondistended, soft and nontender.  Normal bowel sounds heard. Central nervous system: Alert and oriented. No focal neurological deficits. Extremities: No edema Skin: Warm dry Psychiatry: Judgement and insight appear normal. Mood & affect appropriate.     Data Reviewed: I have personally reviewed following labs and imaging studies  CBC: Recent Labs  Lab 12/29/19 1129 12/30/19 2101 12/31/19 0454 01/01/20 0443 01/02/20 0502  WBC 10.0 10.7* 6.6  --  7.0    NEUTROABS  --  9.0*  --   --   --   HGB 10.4* 9.3* 7.9* 7.9* 7.8*  HCT 35.7* 32.6* 26.9* 27.2* 26.1*  MCV 72.4* 72.6* 72.1*  --  71.7*  PLT 245 234 183  --  956   Basic Metabolic Panel: Recent Labs  Lab 12/29/19 1129 12/29/19 1129 12/30/19 2101 12/30/19 2101 12/31/19 0454 01/01/20 0443 01/02/20 0502 01/03/20 0452 01/04/20 0528  NA 136  --  138  --  139 139  --   --  137  K 4.4   < > 3.8   < > 3.7 3.6 4.4 4.9 4.5  CL 102  --  107  --  110 111  --   --  100  CO2 23  --  21*  --  24 23  --   --  29  GLUCOSE 246*  --  178*  --  108* 135*  --   --  108*  BUN 20  --  14  --  11 8  --   --  19  CREATININE 1.01*  --  0.93  --  0.94 0.82  --   --  0.89  CALCIUM 8.9  --  8.3*  --  7.7* 7.9*  --   --  8.8*  MG  --   --   --   --  1.8  --  1.9  --   --    < > =  values in this interval not displayed.   GFR: Estimated Creatinine Clearance: 47.9 mL/min (by C-G formula based on SCr of 0.89 mg/dL). Liver Function Tests: Recent Labs  Lab 12/29/19 1129 12/30/19 2101  AST 25 17  ALT 19 17  ALKPHOS 66 60  BILITOT 0.6 0.5  PROT 6.1* 5.9*  ALBUMIN 3.5 3.5   Recent Labs  Lab 12/29/19 1129 12/30/19 2101  LIPASE 15 20   No results for input(s): AMMONIA in the last 168 hours. Coagulation Profile: No results for input(s): INR, PROTIME in the last 168 hours. Cardiac Enzymes: No results for input(s): CKTOTAL, CKMB, CKMBINDEX, TROPONINI in the last 168 hours. BNP (last 3 results) No results for input(s): PROBNP in the last 8760 hours. HbA1C: No results for input(s): HGBA1C in the last 72 hours. CBG: No results for input(s): GLUCAP in the last 168 hours. Lipid Profile: No results for input(s): CHOL, HDL, LDLCALC, TRIG, CHOLHDL, LDLDIRECT in the last 72 hours. Thyroid Function Tests: No results for input(s): TSH, T4TOTAL, FREET4, T3FREE, THYROIDAB in the last 72 hours. Anemia Panel: No results for input(s): VITAMINB12, FOLATE, FERRITIN, TIBC, IRON, RETICCTPCT in the last 72  hours. Sepsis Labs: No results for input(s): PROCALCITON, LATICACIDVEN in the last 168 hours.  Recent Results (from the past 240 hour(s))  C Difficile Quick Screen w PCR reflex     Status: None   Collection Time: 12/31/19 12:29 AM   Specimen: Stool  Result Value Ref Range Status   C Diff antigen NEGATIVE NEGATIVE Final   C Diff toxin NEGATIVE NEGATIVE Final   C Diff interpretation No C. difficile detected.  Final    Comment: Performed at [facility], [address] [place], [place] [address]  GI pathogen panel by PCR, stool     Status: None   Collection Time: 12/31/19 12:29 AM   Specimen: Stool  Result Value Ref Range Status   Plesiomonas shigelloides NOT DETECTED NOT DETECTED Final   Yersinia enterocolitica NOT DETECTED NOT DETECTED Final   Vibrio NOT DETECTED NOT DETECTED Final   Enteropathogenic E coli NOT DETECTED NOT DETECTED Final   E coli (ETEC) LT/ST NOT DETECTED NOT DETECTED Final   E coli 9528 by PCR Not applicable NOT DETECTED Final   Cryptosporidium by PCR NOT DETECTED NOT DETECTED Final   Entamoeba histolytica NOT DETECTED NOT DETECTED Final   Adenovirus F 40/41 NOT DETECTED NOT DETECTED Final   Norovirus GI/GII NOT DETECTED NOT DETECTED Final   Sapovirus NOT DETECTED NOT DETECTED Final    Comment: (NOTE) Performed At: [facility] [place], [place] [id number] Rush Farmer MD UV:[id number]    Vibrio cholerae NOT DETECTED NOT DETECTED Final   Campylobacter by PCR NOT DETECTED NOT DETECTED Final   Salmonella by PCR NOT DETECTED NOT DETECTED Final   E coli (STEC) NOT DETECTED NOT DETECTED Final   Enteroaggregative E coli NOT DETECTED NOT DETECTED Final   Shigella by PCR NOT DETECTED NOT DETECTED Final   Cyclospora cayetanensis NOT DETECTED NOT DETECTED Final   Astrovirus NOT DETECTED NOT DETECTED Final   G lamblia by PCR NOT DETECTED NOT DETECTED Final   Rotavirus A by PCR NOT DETECTED NOT DETECTED Final  Respiratory Panel by RT  PCR (Flu A&B, Covid) - Nasopharyngeal Swab     Status: None   Collection Time: 12/31/19 12:29 AM   Specimen: Nasopharyngeal Swab  Result Value Ref Range Status   SARS Coronavirus 2 by RT PCR NEGATIVE NEGATIVE Final  Comment: (NOTE) SARS-CoV-2 target nucleic acids are NOT DETECTED. The SARS-CoV-2 RNA is generally detectable in upper respiratoy specimens during the acute phase of infection. The lowest concentration of SARS-CoV-2 viral copies this assay can detect is 131 copies/mL. A negative result does not preclude SARS-Cov-2 infection and should not be used as the sole basis for treatment or other patient management decisions. A negative result may occur with  improper specimen collection/handling, submission of specimen other than nasopharyngeal swab, presence of viral mutation(s) within the areas targeted by this assay, and inadequate number of viral copies (<131 copies/mL). A negative result must be combined with clinical observations, patient history, and epidemiological information. The expected result is Negative. Fact Sheet for Patients:  PinkCheek.be Fact Sheet for Healthcare Providers:  GravelBags.it This test is not yet ap proved or cleared by the [place] FDA and  has been authorized for detection and/or diagnosis of SARS-CoV-2 by FDA under an Emergency Use Authorization (EUA). This EUA will remain  in effect (meaning this test can be used) for the duration of the COVID-19 declaration under Section 564(b)(1) of the Act, 21 U.S.C. section 360bbb-3(b)(1), unless the authorization is terminated or revoked sooner.    Influenza A by PCR NEGATIVE NEGATIVE Final   Influenza B by PCR NEGATIVE NEGATIVE Final    Comment: (NOTE) The Xpert Xpress SARS-CoV-2/FLU/RSV assay is intended as an aid in  the diagnosis of influenza from Nasopharyngeal swab specimens and  should not be used as a sole basis for treatment. Nasal  washings and  aspirates are unacceptable for Xpert Xpress SARS-CoV-2/FLU/RSV  testing. Fact Sheet for Patients: PinkCheek.be Fact Sheet for Healthcare Providers: GravelBags.it This test is not yet approved or cleared by the [place] FDA and  has been authorized for detection and/or diagnosis of SARS-CoV-2 by  FDA under an Emergency Use Authorization (EUA). This EUA will remain  in effect (meaning this test can be used) for the duration of the  Covid-19 declaration under Section 564(b)(1) of the Act, 21  U.S.C. section 360bbb-3(b)(1), unless the authorization is  terminated or revoked. Performed at [facility], [address], [place], [place] [address]          Radiology Studies: No results found.      Scheduled Meds: . apixaban  5 mg Oral BID  . busPIRone  7.5 mg Oral BID  . diltiazem  240 mg Oral Daily  . ferrous sulfate  325 mg Oral Q breakfast  . FLUoxetine  40 mg Oral Daily  . furosemide  20 mg Oral Daily  . levothyroxine  75 mcg Oral QAC breakfast  . off the beat book   Does not apply Once  . potassium chloride  20 mEq Oral BID  . predniSONE  10 mg Oral Q breakfast  . sodium chloride flush  3 mL Intravenous Q12H  . tadalafil (PAH)  40 mg Oral QHS  . traZODone  50 mg Oral QHS   Continuous Infusions:   Assessment & Plan:   Principal Problem:   Gastroenteritis Active Problems:   Type 2 diabetes mellitus (HCC)   Anemia, iron deficiency   Adult hypothyroidism   Hypertension associated with diabetes ([place])   PAF (paroxysmal atrial fibrillation) (HCC)   Interstitial lung disease (HCC)  Gastroenteritis: With persistent nausea, vomiting, and diarrhea.  CT abdomen/pelvis with contrast on 12/29/2019 without acute etiology.  Her diarrhea has resolved. -Follow-up C. 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 -Continue prednisone 10 mg daily -Holding azathioprine for now as above -Continue supplemental oxygen, maintaining well on home 4 L O2 via [place]  Paroxysmal atrial fibrillation: With intermittent RVR.  Has been on IV Cardizem as needed when she goes into RVR  Now in sinus rhythm.  Will go back on long-acting Cardizem 240 daily. Potassium and magnesium stable Continue sotalol Had nonsustained VT previously. Cards following, discussed management since she continues having intermittent RVR.  Dr. Ubaldo Glassing will discuss with EP tomorrow about different options for better rate control.  May need to discuss A. fib ablation?   IV magnesium 2 g was given previously. Plan: Cardiology spoke to [facility] EP, will start Multaq 400 mg twice daily.  Will need 3 days of sotalol washout. Continue with Cardizem.    Microcytic anemia: H&H slowly has trending down, has received IV fluids could be dilutional.  Stool occult negative.  Iron studies correlate with iron defic. Anemia- Transfused iv iron x2 so far. Told patient she needs to follow-up with PCP for outpatient work-up.   Plan: Ck am CBC     Pulmonary hypertension: -Continue tadalafil Early volume overloaded likely due to A. fib RVR.   Continue lasix  Hypertension: Currently stable.  Continue diltiazem and sotalol as above.  Hypothyroidism: TSH 1.87 on 11/03/2019.  Continue Synthroid.  Diet-controlled type 2 diabetes: Continue to monitor.  Depression/anxiety: Continue fluoxetine and BuSpar.  DVT prophylaxis: Eliquis Code Status: Full  Family Communication: none at bedside Disposition Plan: From home, likely discharge to home  Barrier: Having multiple episodes of A. fib RVR uncontrolled, will need to have 3 days of sotalol washout and start Multaq.  Cardiology following closely.    LOS: 4 days   Time  spent: 45 minutes or more than 50% COC    Nolberto Hanlon, MD [facility] Pager [phone]  If 7PM-7AM, please contact night-coverage [email or web address] Password [facility] 01/04/2020, 3:43 PM Patient ID: ANAYSIA GERMER, female   DOB: 12-11-1940, 78 y.o.   MRN: [id number]

## 2020-01-04 NOTE — Progress Notes (Signed)
Patient rested comfortably throughout night. No changes noted. She discussed briefly with me about sadness related to decreased abilities to perform hobbies such as baking. Denies depression. Reinforced presence of home health may improve her capacity to perform hobbies to some degree.

## 2020-01-05 DIAGNOSIS — K529 Noninfective gastroenteritis and colitis, unspecified: Secondary | ICD-10-CM | POA: Diagnosis not present

## 2020-01-05 LAB — CBC
HCT: 28.3 % — ABNORMAL LOW (ref 36.0–46.0)
Hemoglobin: 8.7 g/dL — ABNORMAL LOW (ref 12.0–15.0)
MCH: 21.9 pg — ABNORMAL LOW (ref 26.0–34.0)
MCHC: 30.7 g/dL (ref 30.0–36.0)
MCV: 71.3 fL — ABNORMAL LOW (ref 80.0–100.0)
Platelets: 289 10*3/uL (ref 150–400)
RBC: 3.97 MIL/uL (ref 3.87–5.11)
RDW: 19.8 % — ABNORMAL HIGH (ref 11.5–15.5)
WBC: 10.8 10*3/uL — ABNORMAL HIGH (ref 4.0–10.5)
nRBC: 0.5 % — ABNORMAL HIGH (ref 0.0–0.2)

## 2020-01-05 MED ORDER — FAMOTIDINE 20 MG PO TABS
20.0000 mg | ORAL_TABLET | Freq: Every day | ORAL | Status: DC
  Administered 2020-01-06 – 2020-01-15 (×10): 20 mg via ORAL
  Filled 2020-01-05 (×10): qty 1

## 2020-01-05 MED ORDER — CALCIUM CARBONATE ANTACID 500 MG PO CHEW
1.0000 | CHEWABLE_TABLET | Freq: Two times a day (BID) | ORAL | Status: DC
  Administered 2020-01-05 – 2020-01-15 (×20): 200 mg via ORAL
  Filled 2020-01-05 (×20): qty 1

## 2020-01-05 NOTE — Progress Notes (Signed)
Physical Therapy Treatment Patient Details Name: Julie Mcmillan MRN: [id number] DOB: 09/24/1941 Today's Date: 01/05/2020    History of Present Illness 79 y.o. female with history of paroxysmal atrial fibrillation, pulmonary hypertension on tadalafil who presented to the emergency room with complaints of abdominal discomfort and increased diarrhea.  She has had a problem with this in the past.  She is being followed at [facility] EP for her paroxysmal A. fib. PLOF ind without AD    PT Comments    Pt flipping between Afib and sinus rhythm this date, cleared to participate for therapy. Able to participate in ambulation in hallway with chair follow. 1 seated rest break needed. All vitals WNL. Improved distance from previous date and appears steady. Limited by exertion. Pt reports she isn't comfortable in recliner and wishes to return back to bed. Will continue to progress as able.  Follow Up Recommendations  Home health PT     Equipment Recommendations  Rolling walker with 5" wheels    Recommendations for Other Services       Precautions / Restrictions Precautions Precautions: Fall Restrictions Weight Bearing Restrictions: No    Mobility  Bed Mobility Overal bed mobility: Needs Assistance Bed Mobility: Supine to Sit     Supine to sit: Independent     General bed mobility comments: easily transfers to EOB, slightly impulsive. When asked to transfer to opposite side of bed she swung her legs to opposite side with ease. Once seated at EOB, upright posture noted  Transfers Overall transfer level: Needs assistance Equipment used: Rolling walker (2 wheeled) Transfers: Sit to/from Stand Sit to Stand: Supervision         General transfer comment: safe technique with upright posture  Ambulation/Gait Ambulation/Gait assistance: Supervision Gait Distance (Feet): 120 Feet Assistive device: Rolling walker (2 wheeled) Gait Pattern/deviations: Step-through  pattern     General Gait Details: ambulated in hallway with 1 seated rest break due to fatigue. HR and O2 sats remained stable. All mobility performed on 4L of O2. Improved reciprocal fluid pattern and slow speed.   Stairs             Wheelchair Mobility    Modified Rankin (Stroke Patients Only)       Balance Overall balance assessment: Needs assistance Sitting-balance support: Feet supported Sitting balance-Leahy Scale: Good     Standing balance support: Bilateral upper extremity supported Standing balance-Leahy Scale: Good                              Cognition Arousal/Alertness: Awake/alert Behavior During Therapy: WFL for tasks assessed/performed Overall Cognitive Status: Within Functional Limits for tasks assessed                                        Exercises      General Comments        Pertinent Vitals/Pain Pain Assessment: No/denies pain    Home Living                      Prior Function            PT Goals (current goals can now be found in the care plan section) Acute Rehab PT Goals Patient Stated Goal: go home PT Goal Formulation: With patient Time For Goal Achievement: 01/16/20 Potential to Achieve Goals: Good  Progress towards PT goals: Progressing toward goals    Frequency    Min 2X/week      PT Plan Current plan remains appropriate    Co-evaluation              AM-PAC PT "6 Clicks" Mobility   Outcome Measure  Help needed turning from your back to your side while in a flat bed without using bedrails?: None Help needed moving from lying on your back to sitting on the side of a flat bed without using bedrails?: None Help needed moving to and from a bed to a chair (including a wheelchair)?: None Help needed standing up from a chair using your arms (e.g., wheelchair or bedside chair)?: None Help needed to walk in hospital room?: None Help needed climbing 3-5 steps with a railing? :  A Little 6 Click Score: 23    End of Session Equipment Utilized During Treatment: Gait belt;Oxygen Activity Tolerance: Patient tolerated treatment well Patient left: in bed;with bed alarm set Nurse Communication: Mobility status PT Visit Diagnosis: Other abnormalities of gait and mobility (R26.89);Unsteadiness on feet (R26.81);Muscle weakness (generalized) (M62.81)     Time: 1505-6979 PT Time Calculation (min) (ACUTE ONLY): 24 min  Charges:  $Gait Training: 23-37 mins                     Greggory Stallion, [place], DPT [phone]    Julie Mcmillan 01/05/2020, 4:15 PM

## 2020-01-05 NOTE — Progress Notes (Signed)
Patient Name: Julie Mcmillan Date of Encounter: 01/05/2020  Hospital Problem List     Principal Problem:   Gastroenteritis Active Problems:   Type 2 diabetes mellitus (HCC)   Anemia, iron deficiency   Adult hypothyroidism   Hypertension associated with diabetes ([place])   PAF (paroxysmal atrial fibrillation) ([place])   Interstitial lung disease ([place])    Patient Profile     79 y.o.femalewith history ofparoxysmal atrial fibrillation, pulmonary hypertension on tadalafil who presented to the emergency room with complaints of abdominal discomfort and increased diarrhea. She has had a problem with this in the past. She is being followed at [facility] for her paroxysmal A. fib and has been on sotalol 80 mg twice daily for this along with Eliquis for anticoagulation and diltiazem. She states she has breakthrough A. fib 3-4 times a month. She had atrial fibrillation episodes with rates of 144. She denies chest pain or change in her breathing pattern. She denies syncope or presyncope.   Subjective   Abdominal symptoms improved.   Inpatient Medications    . apixaban  5 mg Oral BID  . busPIRone  7.5 mg Oral BID  . diltiazem  240 mg Oral Daily  . ferrous sulfate  325 mg Oral Q breakfast  . FLUoxetine  40 mg Oral Daily  . furosemide  20 mg Oral Daily  . levothyroxine  75 mcg Oral QAC breakfast  . off the beat book   Does not apply Once  . potassium chloride  20 mEq Oral BID  . predniSONE  10 mg Oral Q breakfast  . sodium chloride flush  3 mL Intravenous Q12H  . tadalafil (PAH)  40 mg Oral QHS  . traZODone  50 mg Oral QHS    Vital Signs    Vitals:   01/04/20 1300 01/04/20 1546 01/04/20 1922 01/05/20 0419  BP: (!) 128/59 (!) 105/53 (!) 115/55 (!) 106/55  Pulse: (!) 56 60 62 (!) 55  Resp: _0 Temp: 97.7 F (36.5 C) 97.6 F (36.4 C) 98.5 F (36.9 C) 97.9 F (36.6 C)  TempSrc:   Oral Oral  SpO2: 100% 98% 97% 100%  Weight:    64.5 kg  Height:         Intake/Output Summary (Last 24 hours) at 01/05/2020 0746 Last data filed at 01/05/2020 0426 Gross per 24 hour  Intake 960 ml  Output 1100 ml  Net -140 ml   Filed Weights   01/03/20 0608 01/04/20 0415 01/05/20 0419  Weight: 65.9 kg 66 kg 64.5 kg    Physical Exam    GEN: Well nourished, well developed, in no acute distress.  HEENT: normal.  Neck: Supple, no JVD, carotid bruits, or masses. Cardiac: RRR, no murmurs, rubs, or gallops. No clubbing, cyanosis, edema.  Radials/DP/PT 2+ and equal bilaterally.  Respiratory:  Respirations regular and unlabored, clear to auscultation bilaterally. GI: Soft, nontender, nondistended, BS + x 4. MS: no deformity or atrophy. Skin: warm and dry, no rash. Neuro:  Strength and sensation are intact. Psych: Normal affect.  Labs    CBC Recent Labs    01/05/20 0531  WBC 10.8*  HGB 8.7*  HCT 28.3*  MCV 71.3*  PLT 338   Basic Metabolic Panel Recent Labs    01/03/20 0452 01/04/20 0528  NA  --  137  K 4.9 4.5  CL  --  100  CO2  --  29  GLUCOSE  --  108*  BUN  --  19  CREATININE  --  0.89  CALCIUM  --  8.8*   Liver Function Tests No results for input(s): AST, ALT, ALKPHOS, BILITOT, PROT, ALBUMIN in the last 72 hours. No results for input(s): LIPASE, AMYLASE in the last 72 hours. Cardiac Enzymes No results for input(s): CKTOTAL, CKMB, CKMBINDEX, TROPONINI in the last 72 hours. BNP No results for input(s): BNP in the last 72 hours. D-Dimer No results for input(s): DDIMER in the last 72 hours. Hemoglobin A1C No results for input(s): HGBA1C in the last 72 hours. Fasting Lipid Panel No results for input(s): CHOL, HDL, LDLCALC, TRIG, CHOLHDL, LDLDIRECT in the last 72 hours. Thyroid Function Tests No results for input(s): TSH, T4TOTAL, T3FREE, THYROIDAB in the last 72 hours.  Invalid input(s): [employer]    nsr currently. Nonsustained runs of asymptomatic svt and probable aflutter with abarancy..  ECG        Radiology    CT ABDOMEN PELVIS W CONTRAST  Result Date: 12/29/2019 CLINICAL DATA:  Nausea, vomiting and diarrhea since last night, intermittent abdominal pain rated 4/10, history of type II diabetes mellitus, atrial fibrillation, diverticulitis, GERD, hypertension, irritable bowel syndrome, pulmonary hypertension, pulmonary fibrosis EXAM: CT ABDOMEN AND PELVIS WITH CONTRAST TECHNIQUE: Multidetector CT imaging of the abdomen and pelvis was performed using the standard protocol following bolus administration of intravenous contrast. Sagittal and coronal MPR images reconstructed from axial data set. CONTRAST:  1102m OMNIPAQUE IOHEXOL 300 MG/ML SOLN IV. No oral contrast. COMPARISON:  09/04/2019 FINDINGS: Lower chest: Bibasilar pulmonary fibrosis unchanged. Hepatobiliary: Contracted gallbladder. Liver unremarkable Pancreas: Hypoechoic nodule at body of pancreas, 8 x 12 mm, grossly unchanged. No additional pancreatic abnormalities. Spleen: Several tiny nonspecific low-attenuation foci within spleen, nonspecific. Adrenals/Urinary Tract: Adrenal glands normal appearance. Cortical scarring and small cysts within both kidneys. No enhancing renal mass, hydronephrosis or urinary tract calcification. Bladder and ureters unremarkable. Stomach/Bowel: Low lying cecum in pelvis. Appendix surgically absent by history. Distal colonic diverticulosis without evidence of diverticulitis. Moderate-sized hiatal hernia. Vascular/Lymphatic: Scattered atherosclerotic calcifications aorta and iliac arteries without aneurysm. Additional calcified plaques at the origins of the celiac and superior mesenteric arteries. No adenopathy. Reproductive: Uterus surgically absent with nonvisualization of ovaries Other: No free air or free fluid. No acute inflammatory process or hernia identified. Musculoskeletal: Osseous demineralization marked compression fracture of T8 vertebral body unchanged since another CT of 10/26/2016. IMPRESSION: Distal  colonic diverticulosis without evidence of diverticulitis. Moderate-sized hiatal hernia. Stable cystic lesion at body of pancreas 8 x 12 x 14 mm. Stable bibasilar pulmonary fibrosis. Stable marked compression fracture T8 vertebral body. No acute intra-abdominal or intrapelvic abnormalities. Aortic Atherosclerosis (ICD10-I70.0). Electronically Signed   By: MLavonia DanaM.D.   On: 12/29/2019 13:09   DG Chest Portable 1 View  Result Date: 12/29/2019 CLINICAL DATA:  Pain with nausea and vomiting EXAM: PORTABLE CHEST 1 VIEW COMPARISON:  August 10, 2019 FINDINGS: There is extensive fibrosis throughout the lungs bilaterally. There is no frank edema or consolidation. Heart is upper normal in size with pulmonary vascularity normal. No adenopathy. No bone lesions. There is aortic atherosclerosis. IMPRESSION: Extensive fibrosis throughout the lungs, stable. No edema or airspace opacity. Stable cardiac silhouette. No adenopathy. Aortic Atherosclerosis (ICD10-I70.0). Electronically Signed   By: WLowella GripIII M.D.   On: 12/29/2019 13:31   ECHOCARDIOGRAM COMPLETE  Result Date: 01/03/2020    ECHOCARDIOGRAM REPORT   Patient Name:   Julie HALUSKADate of Exam: 01/02/2020 Medical Rec #:  [id number]  Height:       64.0 in Accession #:    [id number]       Weight:       147.0 lb Date of Birth:  08/20/1941         BSA:          1.716 m Patient Age:    62 years         BP:           144/61 mmHg Patient Gender: F                HR:           66 bpm. Exam Location:  [facility] Procedure: 2D Echo Indications:     Abnormal ECG 794.31/ R94.31  History:         Patient has prior history of Echocardiogram examinations, most                  recent 03/26/2015.  Sonographer:     Arville Go RDCS Referring Phys:  [id number] [place] Diagnosing Phys: Bartholome Bill MD IMPRESSIONS  1. Left ventricular ejection fraction, by estimation, is 60 to 65%. The left ventricle has normal function. The left ventricle has no regional wall motion  abnormalities. There is mild asymmetric left ventricular hypertrophy of the basal-septal segment. Left ventricular diastolic parameters are consistent with Grade I diastolic dysfunction (impaired relaxation).  2. Right ventricular systolic function is normal. The right ventricular size is normal.  3. Left atrial size was mildly dilated.  4. Right atrial size was mildly dilated.  5. The mitral valve is grossly normal. Trivial mitral valve regurgitation.  6. The aortic valve is tricuspid. Aortic valve regurgitation is mild. No aortic stenosis is present. FINDINGS  Left Ventricle: Left ventricular ejection fraction, by estimation, is 60 to 65%. The left ventricle has normal function. The left ventricle has no regional wall motion abnormalities. The left ventricular internal cavity size was normal in size. There is  mild asymmetric left ventricular hypertrophy of the basal-septal segment. Left ventricular diastolic parameters are consistent with Grade I diastolic dysfunction (impaired relaxation). Right Ventricle: The right ventricular size is normal. No increase in right ventricular wall thickness. Right ventricular systolic function is normal. Left Atrium: Left atrial size was mildly dilated. Right Atrium: Right atrial size was mildly dilated. Pericardium: There is no evidence of pericardial effusion. Mitral Valve: The mitral valve is grossly normal. Trivial mitral valve regurgitation. Tricuspid Valve: The tricuspid valve is normal in structure. Tricuspid valve regurgitation is mild. Aortic Valve: The aortic valve is tricuspid. Aortic valve regurgitation is mild. Aortic regurgitation PHT measures 639 msec. No aortic stenosis is present. Aortic valve peak gradient measures 11.2 mmHg. Pulmonic Valve: The pulmonic valve was grossly normal. Pulmonic valve regurgitation is trivial. Aorta: The aortic root is normal in size and structure. IAS/Shunts: The interatrial septum was not assessed.  LEFT VENTRICLE PLAX 2D LVIDd:          4.31 cm  Diastology LVIDs:         2.89 cm  LV e' lateral:   4.68 cm/s LV PW:         1.32 cm  LV E/e' lateral: 19.5 LV IVS:        1.14 cm  LV e' medial:    4.46 cm/s LVOT diam:     1.90 cm  LV E/e' medial:  20.4 LV SV:         67 LV SV Index:   39  LVOT Area:     2.84 cm  RIGHT VENTRICLE RV Basal diam:  3.23 cm RV S prime:     16.60 cm/s TAPSE (M-mode): 2.2 cm LEFT ATRIUM             Index       RIGHT ATRIUM           Index LA diam:        3.90 cm 2.27 cm/m  RA Area:     12.80 cm LA Vol (A2C):   25.2 ml 14.68 ml/m RA Volume:   29.60 ml  17.25 ml/m LA Vol (A4C):   37.0 ml 21.56 ml/m LA Biplane Vol: 32.7 ml 19.05 ml/m  AORTIC VALVE                PULMONIC VALVE AV Area (Vmax): 1.90 cm    PV Vmax:       1.17 m/s AV Vmax:        167.00 cm/s PV Peak grad:  5.5 mmHg AV Peak Grad:   11.2 mmHg LVOT Vmax:      112.00 cm/s LVOT Vmean:     64.500 cm/s LVOT VTI:       0.235 m AI PHT:         639 msec  AORTA Ao Root diam: 2.90 cm Ao Asc diam:  2.70 cm MITRAL VALVE                TRICUSPID VALVE MV Area (PHT): 3.42 cm     TV Peak grad:   55.2 mmHg MV Decel Time: 222 msec     TV Vmax:        3.72 m/s MV E velocity: 91.20 cm/s MV A velocity: 127.00 cm/s  SHUNTS MV E/A ratio:  0.72         Systemic VTI:  0.24 m                             Systemic Diam: 1.90 cm Bartholome Bill MD Electronically signed by Bartholome Bill MD Signature Date/Time: 01/03/2020/9:14:55 AM    Final     Assessment & Plan    1. Atrial flutter-discussed with [facility].  Will attempt to try Multaq 400 mg twice daily.  Will need 3 days of sotalol washout.  Have discontinued sotalol and will control rhythm and rate with Cardizem and metoprolol for now and add Multaq after washout..  Consideration for amiodarone could be raised as well however interstitial lung disease would make this less ideal.  If this fails, consideration for ablation. Rhythm relatively stable today. WIll continue sotolol wash out and follow.   Signed, Javier Docker  Chantelle Verdi MD 01/05/2020, 7:46 AM  Pager: [phone]

## 2020-01-05 NOTE — Progress Notes (Signed)
Md notified via secure chat. Pt is going in and out of a-fib. The heart rate is controled. I will await any new orders and continue to assess.

## 2020-01-05 NOTE — Plan of Care (Signed)
  Problem: Education: Goal: Knowledge of General Education information will improve Description: Including pain rating scale, medication(s)/side effects and non-pharmacologic comfort measures Outcome: Progressing   Problem: Clinical Measurements: Goal: Ability to maintain clinical measurements within normal limits will improve Outcome: Not Progressing Goal: Respiratory complications will improve Outcome: Not Progressing Note: Patient is dyspneic with minimal exertion.

## 2020-01-05 NOTE — Progress Notes (Signed)
MD notified. Pt has 6 beats of vtach reported by CCMD. I will continue to assess.and await any new orders.

## 2020-01-05 NOTE — Progress Notes (Signed)
PROGRESS NOTE    Julie Mcmillan  WRU:[id number] DOB: 1941-04-12 DOA: 12/30/2019 PCP: Kirk Ruths, MD    Brief Narrative:  Julie Mcmillan is a 79 y.o. female with history of paroxysmal atrial fibrillation, pulmonary hypertension on tadalafil who presented to the emergency room with complaints of abdominal discomfort and increased diarrhea.  Also while in the ED had A. fib RVR intermittently on Cardizem and nonsustained VT.    Consultants:   Cardiology  Procedures: CT and echo  Antimicrobials:      Subjective: Has no new complaints.  Went back into A. fib with rate control.    Objective: Vitals:   01/05/20 0419 01/05/20 0748 01/05/20 1130 01/05/20 1230  BP: (!) 106/55 (!) 119/48 (!) 113/55   Pulse: (!) 55 67 71 65  Resp: _0 Temp: 97.9 F (36.6 C) 98.3 F (36.8 C) 98.8 F (37.1 C)   TempSrc: Oral Oral Oral   SpO2: 100% 95% 99%   Weight: 64.5 kg     Height:        Intake/Output Summary (Last 24 hours) at 01/05/2020 1435 Last data filed at 01/05/2020 1339 Gross per 24 hour  Intake 780 ml  Output 1250 ml  Net -470 ml   Filed Weights   01/03/20 0608 01/04/20 0415 01/05/20 0419  Weight: 65.9 kg 66 kg 64.5 kg    Examination:  General exam: Appears calm and comfortable, NAD Respiratory system: Clear to auscultation. Respiratory effort normal.  No wheezing or rhonchi, no crackles Cardiovascular system: S1 & S2 heard, RRR. No JVD, murmurs, rubs, gallops or clicks.  Gastrointestinal system: Abdomen is nondistended, soft and nontender.  Normal bowel sounds heard.  No guarding Central nervous system: Alert and oriented.  Grossly intact Extremities: No edema Skin: Warm dry Psychiatry: Judgement and insight appear normal. Mood & affect appropriate.     Data Reviewed: I have personally reviewed following labs and imaging studies  CBC: Recent Labs  Lab 12/30/19 2101 12/31/19 0454 01/01/20 0443 01/02/20 0502 01/05/20 0531  WBC 10.7* 6.6  --   7.0 10.8*  NEUTROABS 9.0*  --   --   --   --   HGB 9.3* 7.9* 7.9* 7.8* 8.7*  HCT 32.6* 26.9* 27.2* 26.1* 28.3*  MCV 72.6* 72.1*  --  71.7* 71.3*  PLT 234 183  --  213 914   Basic Metabolic Panel: Recent Labs  Lab 12/30/19 2101 12/30/19 2101 12/31/19 0454 01/01/20 0443 01/02/20 0502 01/03/20 0452 01/04/20 0528  NA 138  --  139 139  --   --  137  K 3.8   < > 3.7 3.6 4.4 4.9 4.5  CL 107  --  110 111  --   --  100  CO2 21*  --  24 23  --   --  29  GLUCOSE 178*  --  108* 135*  --   --  108*  BUN 14  --  11 8  --   --  19  CREATININE 0.93  --  0.94 0.82  --   --  0.89  CALCIUM 8.3*  --  7.7* 7.9*  --   --  8.8*  MG  --   --  1.8  --  1.9  --   --    < > = values in this interval not displayed.   GFR: Estimated Creatinine Clearance: 43.5 mL/min (by C-G formula based on SCr of 0.89 mg/dL). Liver Function Tests: Recent Labs  Lab 12/30/19 2101  AST 17  ALT 17  ALKPHOS 60  BILITOT 0.5  PROT 5.9*  ALBUMIN 3.5   Recent Labs  Lab 12/30/19 2101  LIPASE 20   No results for input(s): AMMONIA in the last 168 hours. Coagulation Profile: No results for input(s): INR, PROTIME in the last 168 hours. Cardiac Enzymes: No results for input(s): CKTOTAL, CKMB, CKMBINDEX, TROPONINI in the last 168 hours. BNP (last 3 results) No results for input(s): PROBNP in the last 8760 hours. HbA1C: No results for input(s): HGBA1C in the last 72 hours. CBG: No results for input(s): GLUCAP in the last 168 hours. Lipid Profile: No results for input(s): CHOL, HDL, LDLCALC, TRIG, CHOLHDL, LDLDIRECT in the last 72 hours. Thyroid Function Tests: No results for input(s): TSH, T4TOTAL, FREET4, T3FREE, THYROIDAB in the last 72 hours. Anemia Panel: No results for input(s): VITAMINB12, FOLATE, FERRITIN, TIBC, IRON, RETICCTPCT in the last 72 hours. Sepsis Labs: No results for input(s): PROCALCITON, LATICACIDVEN in the last 168 hours.  Recent Results (from the past 240 hour(s))  C Difficile Quick Screen  w PCR reflex     Status: None   Collection Time: 12/31/19 12:29 AM   Specimen: Stool  Result Value Ref Range Status   C Diff antigen NEGATIVE NEGATIVE Final   C Diff toxin NEGATIVE NEGATIVE Final   C Diff interpretation No C. difficile detected.  Final    Comment: Performed at [facility], [address] [place], [place] [address]  GI pathogen panel by PCR, stool     Status: None   Collection Time: 12/31/19 12:29 AM   Specimen: Stool  Result Value Ref Range Status   Plesiomonas shigelloides NOT DETECTED NOT DETECTED Final   Yersinia enterocolitica NOT DETECTED NOT DETECTED Final   Vibrio NOT DETECTED NOT DETECTED Final   Enteropathogenic E coli NOT DETECTED NOT DETECTED Final   E coli (ETEC) LT/ST NOT DETECTED NOT DETECTED Final   E coli 5027 by PCR Not applicable NOT DETECTED Final   Cryptosporidium by PCR NOT DETECTED NOT DETECTED Final   Entamoeba histolytica NOT DETECTED NOT DETECTED Final   Adenovirus F 40/41 NOT DETECTED NOT DETECTED Final   Norovirus GI/GII NOT DETECTED NOT DETECTED Final   Sapovirus NOT DETECTED NOT DETECTED Final    Comment: (NOTE) Performed At: [facility] [place], [place] [id number] Rush Farmer MD EH:[id number]    Vibrio cholerae NOT DETECTED NOT DETECTED Final   Campylobacter by PCR NOT DETECTED NOT DETECTED Final   Salmonella by PCR NOT DETECTED NOT DETECTED Final   E coli (STEC) NOT DETECTED NOT DETECTED Final   Enteroaggregative E coli NOT DETECTED NOT DETECTED Final   Shigella by PCR NOT DETECTED NOT DETECTED Final   Cyclospora cayetanensis NOT DETECTED NOT DETECTED Final   Astrovirus NOT DETECTED NOT DETECTED Final   G lamblia by PCR NOT DETECTED NOT DETECTED Final   Rotavirus A by PCR NOT DETECTED NOT DETECTED Final  Respiratory Panel by RT PCR (Flu A&B, Covid) - Nasopharyngeal Swab     Status: None   Collection Time: 12/31/19 12:29 AM   Specimen: Nasopharyngeal Swab  Result Value Ref Range Status    SARS Coronavirus 2 by RT PCR NEGATIVE NEGATIVE Final    Comment: (NOTE) SARS-CoV-2 target nucleic acids are NOT DETECTED. The SARS-CoV-2 RNA is generally detectable in upper respiratoy specimens during the acute phase of infection. The lowest concentration of SARS-CoV-2 viral copies this assay can detect is 131 copies/mL. A  negative result does not preclude SARS-Cov-2 infection and should not be used as the sole basis for treatment or other patient management decisions. A negative result may occur with  improper specimen collection/handling, submission of specimen other than nasopharyngeal swab, presence of viral mutation(s) within the areas targeted by this assay, and inadequate number of viral copies (<131 copies/mL). A negative result must be combined with clinical observations, patient history, and epidemiological information. The expected result is Negative. Fact Sheet for Patients:  PinkCheek.be Fact Sheet for Healthcare Providers:  GravelBags.it This test is not yet ap proved or cleared by the [place] FDA and  has been authorized for detection and/or diagnosis of SARS-CoV-2 by FDA under an Emergency Use Authorization (EUA). This EUA will remain  in effect (meaning this test can be used) for the duration of the COVID-19 declaration under Section 564(b)(1) of the Act, 21 U.S.C. section 360bbb-3(b)(1), unless the authorization is terminated or revoked sooner.    Influenza A by PCR NEGATIVE NEGATIVE Final   Influenza B by PCR NEGATIVE NEGATIVE Final    Comment: (NOTE) The Xpert Xpress SARS-CoV-2/FLU/RSV assay is intended as an aid in  the diagnosis of influenza from Nasopharyngeal swab specimens and  should not be used as a sole basis for treatment. Nasal washings and  aspirates are unacceptable for Xpert Xpress SARS-CoV-2/FLU/RSV  testing. Fact Sheet for Patients: PinkCheek.be Fact  Sheet for Healthcare Providers: GravelBags.it This test is not yet approved or cleared by the [place] FDA and  has been authorized for detection and/or diagnosis of SARS-CoV-2 by  FDA under an Emergency Use Authorization (EUA). This EUA will remain  in effect (meaning this test can be used) for the duration of the  Covid-19 declaration under Section 564(b)(1) of the Act, 21  U.S.C. section 360bbb-3(b)(1), unless the authorization is  terminated or revoked. Performed at [facility], [address], [place], [place] [address]          Radiology Studies: No results found.      Scheduled Meds: . apixaban  5 mg Oral BID  . busPIRone  7.5 mg Oral BID  . diltiazem  240 mg Oral Daily  . ferrous sulfate  325 mg Oral Q breakfast  . FLUoxetine  40 mg Oral Daily  . furosemide  20 mg Oral Daily  . levothyroxine  75 mcg Oral QAC breakfast  . off the beat book   Does not apply Once  . potassium chloride  20 mEq Oral BID  . predniSONE  10 mg Oral Q breakfast  . sodium chloride flush  3 mL Intravenous Q12H  . tadalafil (PAH)  40 mg Oral QHS  . traZODone  50 mg Oral QHS   Continuous Infusions:   Assessment & Plan:   Principal Problem:   Gastroenteritis Active Problems:   Type 2 diabetes mellitus (HCC)   Anemia, iron deficiency   Adult hypothyroidism   Hypertension associated with diabetes ([place])   PAF (paroxysmal atrial fibrillation) (HCC)   Interstitial lung disease (HCC)  Gastroenteritis: With persistent nausea, vomiting, and diarrhea.  CT abdomen/pelvis with contrast on 12/29/2019 without acute etiology.   C. difficile, GI pathogen panel, SARS-CoV-2 PCR-so far negative -Hold azathioprine, colestipol, Bactrim for now Her diarrhea has resolved.  Interstitial lung disease/pulmonary fibrosis with chronic respiratory failure: Chronic and appears stable without any new respiratory symptoms. -Continue prednisone 10 mg daily  -Holding azathioprine for now as above, can resume on discharge -Continue supplemental oxygen, maintaining well on home 4 L  O2 via [place]  Paroxysmal atrial fibrillation: With intermittent RVR.  Has been on IV Cardizem as needed when she goes into RVR  Now in sinus rhythm.  Will go back on long-acting Cardizem 240 daily. Potassium and magnesium stable Continue sotalol Had nonsustained VT previously. Cards following, discussed management since she continues having intermittent RVR.  Dr. Ubaldo Glassing will discuss with EP tomorrow about different options for better rate control.  May need to discuss A. fib ablation?   IV magnesium 2 g was given previously. Plan: Cardiology spoke to [facility] EP, will start Multaq 400 mg twice daily after sotalol washout day 2/3.Marland Kitchen  Will need 3 days of sotalol washout. Continue with Cardizem , beta blk. If fails on Multaq, will need ablation.     Microcytic anemia: H&H slowly has trending down, has received IV fluids could be dilutional.  Stool occult negative.  Iron studies correlate with iron defic. Anemia- Transfused iv iron x2 so far. Told patient she needs to follow-up with PCP for outpatient work-up.   Cbc today stable.     Pulmonary hypertension: -Continue tadalafil Early volume overloaded likely due to A. fib RVR.   Continue lasix  Hypertension: Currently stable.  Continue diltiazem and sotalol as above.  Hypothyroidism: TSH 1.87 on 11/03/2019.  Continue Synthroid.  Diet-controlled type 2 diabetes: Continue to monitor.  Depression/anxiety: Continue fluoxetine and BuSpar.  DVT prophylaxis: Eliquis Code Status: Full  Family Communication: none at bedside Disposition Plan: From home, likely discharge to home  Barrier: Having multiple episodes of A. fib RVR uncontrolled, will need to have 3 days of sotalol washout and start Multaq.  Cardiology following closely.    LOS: 5 days   Time spent: 45 minutes or more than 50% COC    Nolberto Hanlon,  MD [facility] Pager [phone]  If 7PM-7AM, please contact night-coverage [email or web address] Password [email or web address] 01/05/2020, 2:35 PM Patient ID: Julie Mcmillan, female   DOB: March 30, 1941,

## 2020-01-06 ENCOUNTER — Inpatient Hospital Stay: Payer: Medicare HMO

## 2020-01-06 DIAGNOSIS — K529 Noninfective gastroenteritis and colitis, unspecified: Secondary | ICD-10-CM | POA: Diagnosis not present

## 2020-01-06 MED ORDER — SODIUM CHLORIDE 0.9 % IV SOLN
INTRAVENOUS | Status: DC | PRN
  Administered 2020-01-06 – 2020-01-12 (×2): 500 mL via INTRAVENOUS

## 2020-01-06 MED ORDER — DRONEDARONE HCL 400 MG PO TABS
400.0000 mg | ORAL_TABLET | Freq: Two times a day (BID) | ORAL | Status: DC
  Administered 2020-01-07 – 2020-01-14 (×16): 400 mg via ORAL
  Filled 2020-01-06 (×19): qty 1

## 2020-01-06 MED ORDER — DILTIAZEM HCL 25 MG/5ML IV SOLN
20.0000 mg | Freq: Once | INTRAVENOUS | Status: AC
  Administered 2020-01-06: 20 mg via INTRAVENOUS
  Filled 2020-01-06: qty 5

## 2020-01-06 MED ORDER — DILTIAZEM HCL 25 MG/5ML IV SOLN
5.0000 mg | Freq: Once | INTRAVENOUS | Status: AC
  Administered 2020-01-06: 5 mg via INTRAVENOUS
  Filled 2020-01-06: qty 5

## 2020-01-06 MED ORDER — DILTIAZEM HCL-DEXTROSE 125-5 MG/125ML-% IV SOLN (PREMIX)
5.0000 mg/h | INTRAVENOUS | Status: DC
  Administered 2020-01-06: 5 mg/h via INTRAVENOUS
  Administered 2020-01-06: 7.5 mg/h via INTRAVENOUS
  Filled 2020-01-06 (×2): qty 125

## 2020-01-06 NOTE — NC FL2 (Deleted)
[place] LEVEL OF CARE SCREENING TOOL     IDENTIFICATION  Patient Name: Julie Mcmillan Birthdate: [birth year] Sex: female Admission Date (Current Location): 12/30/2019  [place] and [place] Number:  Engineering geologist and Address:  [facility], [address] [place], [place] [address]      Provider Number: [id number]  Attending Physician Name and Address:  Wyvonnia Dusky, MD  Relative Name and Phone Number:  Synthia Innocent  [phone]    Current Level of Care: SNF Recommended Level of Care: Nursing Facility Prior Approval Number:    Date Approved/Denied:   PASRR Number:    Discharge Plan: Other (Comment)(Long Term Care)    Current Diagnoses: Patient Active Problem List   Diagnosis Date Noted  . Gastroenteritis 12/30/2019  . Closed left hip fracture ([place]) 11/07/2018  . Enterovesical fistula   . Acute diverticulitis 08/12/2017  . Colonic diverticular abscess   . Dyspnea 10/27/2016  . Interstitial lung disease ([place]) 08/31/2016  . Depression, major, single episode, complete remission ([place]) 02/23/2016  . DOE (dyspnea on exertion) 01/26/2016  . Acute respiratory failure with hypoxia ([place]) 11/29/2015  . Fracture of greater trochanter of right femur ([place]) 11/29/2015  . Fall 11/26/2015  . OP (osteoporosis) 07/18/2015  . 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 Adult hypothyroidism 07/18/2015  . Fatigue 07/18/2015  . Anxiety 07/18/2015  . Diverticulitis large intestine 07/04/2015  . Atrial fibrillation with rapid ventricular response ([place]) 03/25/2015  . PAF (paroxysmal atrial fibrillation) ([place]) 03/25/2015  . Acquired hypothyroidism 01/11/2015  . Anemia, iron deficiency 01/10/2015  . Type 2 diabetes mellitus ([place]) 11/04/2014  . 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 Hypertension associated with diabetes ([place]) 11/04/2014  . Temporary cerebral vascular dysfunction 04/08/2014   . Dry mouth 01/05/2013  . Cannot sleep 01/05/2013  . Obstructive apnea 09/02/2012  . Bursitis, ischial 05/19/2012  . GERD (gastroesophageal reflux disease) 02/01/2012  . DD (diverticular disease) 08/14/2011    Orientation RESPIRATION BLADDER Height & Weight     Self, Time, Situation, Place  O2 Incontinent(at times) Weight: 65.1 kg Height:  _0  (162.6 cm)  BEHAVIORAL SYMPTOMS/MOOD NEUROLOGICAL BOWEL NUTRITION STATUS      Continent, incontinent with chronic diarrhea  Diet(regular)  AMBULATORY STATUS COMMUNICATION OF NEEDS Skin   Supervision Verbally Normal                       Personal Care Assistance Level of Assistance  Bathing, Dressing, Feeding Bathing Assistance: Limited assistance Feeding assistance: Independent Dressing Assistance: Limited assistance     Functional Limitations Info             SPECIAL CARE FACTORS FREQUENCY                       Contractures Contractures Info: Not present    Additional Factors Info  Code Status, Allergies Code Status Info: Full Allergies Info: Levaquin, Aspartime, nexium, and amlodipine           Current Medications (01/06/2020):  This is the current hospital active medication list Current Facility-Administered Medications  Medication Dose Route Frequency Provider Last Rate Last Admin  . acetaminophen (TYLENOL) tablet 650 mg  650 mg Oral Q6H PRN Lenore Cordia, MD   650 mg at 01/02/20 0006   Or  . acetaminophen (TYLENOL) suppository 650 mg  650 mg Rectal Q6H PRN Lenore Cordia, MD      .  apixaban (ELIQUIS) tablet 5 mg  5 mg Oral BID Lenore Cordia, MD   5 mg at 01/06/20 0840  . busPIRone (BUSPAR) tablet 7.5 mg  7.5 mg Oral BID Zada Finders R, MD   7.5 mg at 01/06/20 0841  . calcium carbonate (TUMS - dosed in mg elemental calcium) chewable tablet 200 mg of elemental calcium  1 tablet Oral BID Nolberto Hanlon, MD   200 mg of elemental calcium at 01/06/20 0838  . diltiazem (CARDIZEM CD) 24 hr capsule 240 mg   240 mg Oral Daily Teodoro Spray, MD   240 mg at 01/05/20 0936  . famotidine (PEPCID) tablet 20 mg  20 mg Oral Daily Nolberto Hanlon, MD   20 mg at 01/06/20 0839  . ferrous sulfate tablet 325 mg  325 mg Oral Q breakfast Nolberto Hanlon, MD   325 mg at 01/06/20 0839  . FLUoxetine (PROZAC) capsule 40 mg  40 mg Oral Daily Zada Finders R, MD   40 mg at 01/06/20 0840  . furosemide (LASIX) tablet 20 mg  20 mg Oral Daily Nolberto Hanlon, MD   20 mg at 01/05/20 0936  . levothyroxine (SYNTHROID) tablet 75 mcg  75 mcg Oral QAC breakfast Lenore Cordia, MD   75 mcg at 01/06/20 0523  . off the beat book   Does not apply Once Nolberto Hanlon, MD      . potassium chloride SA (KLOR-CON) CR tablet 20 mEq  20 mEq Oral BID Nolberto Hanlon, MD   20 mEq at 01/06/20 0839  . predniSONE (DELTASONE) tablet 10 mg  10 mg Oral Q breakfast Zada Finders R, MD   10 mg at 01/06/20 0840  . promethazine (PHENERGAN) tablet 12.5 mg  12.5 mg Oral Q6H PRN Zada Finders R, MD   12.5 mg at 01/04/20 0901   Or  . promethazine (PHENERGAN) injection 12.5 mg  12.5 mg Intravenous Q6H PRN Zada Finders R, MD   12.5 mg at 12/31/19 0133   Or  . promethazine (PHENERGAN) suppository 12.5 mg  12.5 mg Rectal Q6H PRN Zada Finders R, MD      . sodium chloride flush (NS) 0.9 % injection 3 mL  3 mL Intravenous Q12H Nolberto Hanlon, MD   3 mL at 01/06/20 0844  . tadalafil (PAH) (ADCIRCA) tablet 40 mg  40 mg Oral QHS Lenore Cordia, MD   40 mg at 01/05/20 2036  . traZODone (DESYREL) tablet 50 mg  50 mg Oral QHS Nolberto Hanlon, MD   50 mg at 01/05/20 2035     Discharge Medications: Please see discharge summary for a list of discharge medications.  Relevant Imaging Results:  Relevant Lab Results:   Additional Information [id number]  Victorino Dike, RN

## 2020-01-06 NOTE — NC FL2 (Deleted)
[place] LEVEL OF CARE SCREENING TOOL     IDENTIFICATION  Patient Name: Julie Mcmillan Birthdate: 1941-08-31 Sex: female Admission Date (Current Location): 12/30/2019  [place] and [place] Number:  Engineering geologist and Address:  [facility], [address] [place], [place] [address]      Provider Number: [id number]  Attending Physician Name and Address:  Wyvonnia Dusky, MD  Relative Name and Phone Number:  Synthia Innocent  [phone]    Current Level of Care: SNF Recommended Level of Care: Nursing Facility Prior Approval Number:    Date Approved/Denied:   PASRR Number:    Discharge Plan: Other (Comment)(Long Term Care)    Current Diagnoses: Patient Active Problem List   Diagnosis Date Noted  . Gastroenteritis 12/30/2019  . Closed left hip fracture ([place]) 11/07/2018  . Enterovesical fistula   . Acute diverticulitis 08/12/2017  . Colonic diverticular abscess   . Dyspnea 10/27/2016  . Interstitial lung disease ([place]) 08/31/2016  . Depression, major, single episode, complete remission ([place]) 02/23/2016  . DOE (dyspnea on exertion) 01/26/2016  . Acute respiratory failure with hypoxia ([place]) 11/29/2015  . Fracture of greater trochanter of right femur ([place]) 11/29/2015  . Fall 11/26/2015  . OP (osteoporosis) 07/18/2015  . 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 Adult hypothyroidism 07/18/2015  . Fatigue 07/18/2015  . Anxiety 07/18/2015  . Diverticulitis large intestine 07/04/2015  . Atrial fibrillation with rapid ventricular response ([place]) 03/25/2015  . PAF (paroxysmal atrial fibrillation) ([place]) 03/25/2015  . Acquired hypothyroidism 01/11/2015  . Anemia, iron deficiency 01/10/2015  . Type 2 diabetes mellitus ([place]) 11/04/2014  . 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 Hypertension associated with diabetes ([place]) 11/04/2014  . Temporary cerebral vascular dysfunction 04/08/2014   . Dry mouth 01/05/2013  . Cannot sleep 01/05/2013  . Obstructive apnea 09/02/2012  . Bursitis, ischial 05/19/2012  . GERD (gastroesophageal reflux disease) 02/01/2012  . DD (diverticular disease) 08/14/2011    Orientation RESPIRATION BLADDER Height & Weight     Self, Time, Situation, Place  O2 Incontinent(at times) Weight: 65.1 kg Height:  _0  (162.6 cm)  BEHAVIORAL SYMPTOMS/MOOD NEUROLOGICAL BOWEL NUTRITION STATUS      Continent Diet, Feeding tube(Regular)  AMBULATORY STATUS COMMUNICATION OF NEEDS Skin   Supervision Verbally Normal                       Personal Care Assistance Level of Assistance  Bathing, Dressing, Feeding Bathing Assistance: Limited assistance Feeding assistance: Independent Dressing Assistance: Limited assistance     Functional Limitations Info             SPECIAL CARE FACTORS FREQUENCY                       Contractures Contractures Info: Not present    Additional Factors Info  Code Status, Allergies Code Status Info: Full Allergies Info: Levaquin, Aspartime, nexium, and amlodipine           Current Medications (01/06/2020):  This is the current hospital active medication list Current Facility-Administered Medications  Medication Dose Route Frequency Provider Last Rate Last Admin  . acetaminophen (TYLENOL) tablet 650 mg  650 mg Oral Q6H PRN Lenore Cordia, MD   650 mg at 01/02/20 0006   Or  . acetaminophen (TYLENOL) suppository 650 mg  650 mg Rectal Q6H PRN Lenore Cordia, MD      . apixaban Arne [place])  tablet 5 mg  5 mg Oral BID Lenore Cordia, MD   5 mg at 01/06/20 0840  . busPIRone (BUSPAR) tablet 7.5 mg  7.5 mg Oral BID Zada Finders R, MD   7.5 mg at 01/06/20 0841  . calcium carbonate (TUMS - dosed in mg elemental calcium) chewable tablet 200 mg of elemental calcium  1 tablet Oral BID Nolberto Hanlon, MD   200 mg of elemental calcium at 01/06/20 0838  . diltiazem (CARDIZEM CD) 24 hr capsule 240 mg  240 mg Oral Daily Teodoro Spray, MD   240 mg at 01/05/20 0936  . famotidine (PEPCID) tablet 20 mg  20 mg Oral Daily Nolberto Hanlon, MD   20 mg at 01/06/20 0839  . ferrous sulfate tablet 325 mg  325 mg Oral Q breakfast Nolberto Hanlon, MD   325 mg at 01/06/20 0839  . FLUoxetine (PROZAC) capsule 40 mg  40 mg Oral Daily Zada Finders R, MD   40 mg at 01/06/20 0840  . furosemide (LASIX) tablet 20 mg  20 mg Oral Daily Nolberto Hanlon, MD   20 mg at 01/05/20 0936  . levothyroxine (SYNTHROID) tablet 75 mcg  75 mcg Oral QAC breakfast Lenore Cordia, MD   75 mcg at 01/06/20 0523  . off the beat book   Does not apply Once Nolberto Hanlon, MD      . potassium chloride SA (KLOR-CON) CR tablet 20 mEq  20 mEq Oral BID Nolberto Hanlon, MD   20 mEq at 01/06/20 0839  . predniSONE (DELTASONE) tablet 10 mg  10 mg Oral Q breakfast Zada Finders R, MD   10 mg at 01/06/20 0840  . promethazine (PHENERGAN) tablet 12.5 mg  12.5 mg Oral Q6H PRN Zada Finders R, MD   12.5 mg at 01/04/20 0901   Or  . promethazine (PHENERGAN) injection 12.5 mg  12.5 mg Intravenous Q6H PRN Zada Finders R, MD   12.5 mg at 12/31/19 0133   Or  . promethazine (PHENERGAN) suppository 12.5 mg  12.5 mg Rectal Q6H PRN Zada Finders R, MD      . sodium chloride flush (NS) 0.9 % injection 3 mL  3 mL Intravenous Q12H Nolberto Hanlon, MD   3 mL at 01/06/20 0844  . tadalafil (PAH) (ADCIRCA) tablet 40 mg  40 mg Oral QHS Lenore Cordia, MD   40 mg at 01/05/20 2036  . traZODone (DESYREL) tablet 50 mg  50 mg Oral QHS Nolberto Hanlon, MD   50 mg at 01/05/20 2035     Discharge Medications: Please see discharge summary for a list of discharge medications.  Relevant Imaging Results:  Relevant Lab Results:   Additional Information [id number]  Victorino Dike, RN

## 2020-01-06 NOTE — TOC Transition Note (Signed)
Transition of Care [facility]) - CM/SW Discharge Note   Patient Details  Name: Julie Mcmillan MRN: [id number] Date of Birth: [birth year]  Transition of Care [facility]) CM/SW Contact:  Victorino Dike, RN Phone Number: 01/06/2020, 9:53 AM   Clinical Narrative:    Patient reports she has a lot of assistance at home, but does not feel she has the best care by herself and is looking for assistance.  She reports urinary incontinence. She feels she has decreased energy related to her respiratory illnesses.  Her family has set up for someone to pick up and do her laundry.  She has a Chartered certified accountant, she has groceries delivered.  She reports some days she is so weak she can't get out of bed.  They are seeking Long Term Care placement.    Son returned call and reported patient has partial medicaid at this time.  If patient has an FL2 and placement at a [place], then Medicaid re-evaluates for full medicaid.   They are interested in [facility] in [place] and one other place they are gong to find the name and get back to me.       Final next level of care: [place] Barriers to Discharge: Continued Medical Work up   Patient Goals and CMS Choice Patient states their goals for this hospitalization and ongoing recovery are:: to be as healthy as I can be CMS Medicare.gov Compare Post Acute Care list provided to:: Patient Choice offered to / list presented to : Patient, Adult Children  Discharge Placement                       Discharge Plan and Services In-house Referral: Clinical Social Work Discharge Planning Services: CM Consult                      HH Arranged: PT [place] Agency: [place] ([facility]) Date [place]: 01/06/20 Time [place]: [phone] Representative spoke with at [place]: [place] Determinants of Health ([place]) Interventions     Readmission Risk Interventions No flowsheet data  found.

## 2020-01-06 NOTE — NC FL2 (Signed)
[place] LEVEL OF CARE SCREENING TOOL     IDENTIFICATION  Patient Name: Julie Mcmillan Birthdate: [birth year] Sex: female Admission Date (Current Location): 12/30/2019  [place] and [place] Number:  Engineering geologist and Address:  [facility], [address] [place], [place] [address]      Provider Number: [id number]  Attending Physician Name and Address:  Wyvonnia Dusky, MD  Relative Name and Phone Number:  Synthia Innocent  [phone]    Current Level of Care: SNF Recommended Level of Care: Nursing Facility Prior Approval Number:    Date Approved/Denied:   PASRR Number:    Discharge Plan: Other (Comment)(Long Term Care)    Current Diagnoses: Patient Active Problem List   Diagnosis Date Noted  . Gastroenteritis 12/30/2019  . Closed left hip fracture ([place]) 11/07/2018  . Enterovesical fistula   . Acute diverticulitis 08/12/2017  . Colonic diverticular abscess   . Dyspnea 10/27/2016  . Interstitial lung disease ([place]) 08/31/2016  . Depression, major, single episode, complete remission ([place]) 02/23/2016  . DOE (dyspnea on exertion) 01/26/2016  . Acute respiratory failure with hypoxia ([place]) 11/29/2015  . Fracture of greater trochanter of right femur ([place]) 11/29/2015  . Fall 11/26/2015  . OP (osteoporosis) 07/18/2015  . 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 Adult hypothyroidism 07/18/2015  . Fatigue 07/18/2015  . Anxiety 07/18/2015  . Diverticulitis large intestine 07/04/2015  . Atrial fibrillation with rapid ventricular response ([place]) 03/25/2015  . PAF (paroxysmal atrial fibrillation) ([place]) 03/25/2015  . Acquired hypothyroidism 01/11/2015  . Anemia, iron deficiency 01/10/2015  . Type 2 diabetes mellitus ([place]) 11/04/2014  . 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 Hypertension associated with diabetes ([place]) 11/04/2014  . Temporary cerebral vascular dysfunction 04/08/2014   . Dry mouth 01/05/2013  . Cannot sleep 01/05/2013  . Obstructive apnea 09/02/2012  . Bursitis, ischial 05/19/2012  . GERD (gastroesophageal reflux disease) 02/01/2012  . DD (diverticular disease) 08/14/2011    Orientation RESPIRATION BLADDER Height & Weight     Self, Time, Situation, Place  O2 Incontinent Weight: 65.1 kg Height:  _0  (162.6 cm)  BEHAVIORAL SYMPTOMS/MOOD NEUROLOGICAL BOWEL NUTRITION STATUS      Incontinent, Continent(incontinent with chronic diarrhea) Diet(regular)  AMBULATORY STATUS COMMUNICATION OF NEEDS Skin   Supervision Verbally Normal                       Personal Care Assistance Level of Assistance  Bathing, Dressing, Feeding Bathing Assistance: Limited assistance Feeding assistance: Independent Dressing Assistance: Limited assistance     Functional Limitations Info             SPECIAL CARE FACTORS FREQUENCY                       Contractures Contractures Info: Not present    Additional Factors Info  Code Status, Allergies Code Status Info: Full Allergies Info: Levaquin, Aspartime, nexium, and amlodipine           Current Medications (01/06/2020):  This is the current hospital active medication list Current Facility-Administered Medications  Medication Dose Route Frequency Provider Last Rate Last Admin  . acetaminophen (TYLENOL) tablet 650 mg  650 mg Oral Q6H PRN Lenore Cordia, MD   650 mg at 01/02/20 0006   Or  . acetaminophen (TYLENOL) suppository 650 mg  650 mg Rectal Q6H PRN Lenore Cordia, MD      . apixaban (  ELIQUIS) tablet 5 mg  5 mg Oral BID Lenore Cordia, MD   5 mg at 01/06/20 0840  . busPIRone (BUSPAR) tablet 7.5 mg  7.5 mg Oral BID Zada Finders R, MD   7.5 mg at 01/06/20 0841  . calcium carbonate (TUMS - dosed in mg elemental calcium) chewable tablet 200 mg of elemental calcium  1 tablet Oral BID Nolberto Hanlon, MD   200 mg of elemental calcium at 01/06/20 0838  . diltiazem (CARDIZEM CD) 24 hr capsule 240 mg   240 mg Oral Daily Teodoro Spray, MD   240 mg at 01/05/20 0936  . famotidine (PEPCID) tablet 20 mg  20 mg Oral Daily Nolberto Hanlon, MD   20 mg at 01/06/20 0839  . ferrous sulfate tablet 325 mg  325 mg Oral Q breakfast Nolberto Hanlon, MD   325 mg at 01/06/20 0839  . FLUoxetine (PROZAC) capsule 40 mg  40 mg Oral Daily Zada Finders R, MD   40 mg at 01/06/20 0840  . furosemide (LASIX) tablet 20 mg  20 mg Oral Daily Nolberto Hanlon, MD   20 mg at 01/05/20 0936  . levothyroxine (SYNTHROID) tablet 75 mcg  75 mcg Oral QAC breakfast Lenore Cordia, MD   75 mcg at 01/06/20 0523  . off the beat book   Does not apply Once Nolberto Hanlon, MD      . potassium chloride SA (KLOR-CON) CR tablet 20 mEq  20 mEq Oral BID Nolberto Hanlon, MD   20 mEq at 01/06/20 0839  . predniSONE (DELTASONE) tablet 10 mg  10 mg Oral Q breakfast Zada Finders R, MD   10 mg at 01/06/20 0840  . promethazine (PHENERGAN) tablet 12.5 mg  12.5 mg Oral Q6H PRN Zada Finders R, MD   12.5 mg at 01/04/20 0901   Or  . promethazine (PHENERGAN) injection 12.5 mg  12.5 mg Intravenous Q6H PRN Zada Finders R, MD   12.5 mg at 12/31/19 0133   Or  . promethazine (PHENERGAN) suppository 12.5 mg  12.5 mg Rectal Q6H PRN Zada Finders R, MD      . sodium chloride flush (NS) 0.9 % injection 3 mL  3 mL Intravenous Q12H Nolberto Hanlon, MD   3 mL at 01/06/20 0844  . tadalafil (PAH) (ADCIRCA) tablet 40 mg  40 mg Oral QHS Lenore Cordia, MD   40 mg at 01/05/20 2036  . traZODone (DESYREL) tablet 50 mg  50 mg Oral QHS Nolberto Hanlon, MD   50 mg at 01/05/20 2035     Discharge Medications: Please see discharge summary for a list of discharge medications.  Relevant Imaging Results:  Relevant Lab Results:   Additional Information [id number]  Victorino Dike, RN

## 2020-01-06 NOTE — Progress Notes (Signed)
Patient Name: Julie Mcmillan Date of Encounter: 01/06/2020  Hospital Problem List     Principal Problem:   Gastroenteritis Active Problems:   Type 2 diabetes mellitus (HCC)   Anemia, iron deficiency   Adult hypothyroidism   Hypertension associated with diabetes ([place])   PAF (paroxysmal atrial fibrillation) ([place])   Interstitial lung disease ([place])    Patient Profile     79 y.o.femalewith history ofparoxysmal atrial fibrillation, pulmonary hypertension on tadalafil who presented to the emergency room with complaints of abdominal discomfort and increased diarrhea. She has had a problem with this in the past. She is being followed at [facility] for her paroxysmal A. fib and was on sotalol 80 mg twice daily for this along with Eliquis for anticoagulation and diltiazem. She states she has breakthrough A. fib 3-4 times a month. She was admitted after presenting to the emergency room with abdominal discomfort and she had atrial fibrillation episodes with rates of 144.  Subjective   Abdominal symptoms have improved.  Still having transient breakthroughs of atrial fibrillation.  Inpatient Medications    . apixaban  5 mg Oral BID  . busPIRone  7.5 mg Oral BID  . calcium carbonate  1 tablet Oral BID  . diltiazem  240 mg Oral Daily  . famotidine  20 mg Oral Daily  . ferrous sulfate  325 mg Oral Q breakfast  . FLUoxetine  40 mg Oral Daily  . furosemide  20 mg Oral Daily  . levothyroxine  75 mcg Oral QAC breakfast  . off the beat book   Does not apply Once  . potassium chloride  20 mEq Oral BID  . predniSONE  10 mg Oral Q breakfast  . sodium chloride flush  3 mL Intravenous Q12H  . tadalafil (PAH)  40 mg Oral QHS  . traZODone  50 mg Oral QHS    Vital Signs    Vitals:   01/06/20 0338 01/06/20 0444 01/06/20 0525 01/06/20 0750  BP:  (!) 94/46 (!) 113/54 (!) 96/49  Pulse: 64 (!) 59 (!) 59 62  Resp:   20 19  Temp:  98.3 F (36.8 C)  98.6 F (37 C)   TempSrc:  Oral  Oral  SpO2:  100%  99%  Weight:   65.1 kg   Height:        Intake/Output Summary (Last 24 hours) at 01/06/2020 0904 Last data filed at 01/06/2020 0844 Gross per 24 hour  Intake 783 ml  Output 250 ml  Net 533 ml   Filed Weights   01/04/20 0415 01/05/20 0419 01/06/20 0525  Weight: 66 kg 64.5 kg 65.1 kg    Physical Exam    GEN: Well nourished, well developed, in no acute distress.  HEENT: normal.  Neck: Supple, no JVD, carotid bruits, or masses. Cardiac: RRR, no murmurs, rubs, or gallops. No clubbing, cyanosis, edema.  Radials/DP/PT 2+ and equal bilaterally.  Respiratory:  Respirations regular and unlabored, clear to auscultation bilaterally. GI: Soft with no pressure.  Denies nausea or diarrhea. MS: no deformity or atrophy. Skin: warm and dry, no rash. Neuro:  Strength and sensation are intact. Psych: Normal affect.  Labs    CBC Recent Labs    01/05/20 0531  WBC 10.8*  HGB 8.7*  HCT 28.3*  MCV 71.3*  PLT 584   Basic Metabolic Panel Recent Labs    01/04/20 0528  NA 137  K 4.5  CL 100  CO2 29  GLUCOSE 108*  BUN 19  CREATININE 0.89  CALCIUM 8.8*   Liver Function Tests No results for input(s): AST, ALT, ALKPHOS, BILITOT, PROT, ALBUMIN in the last 72 hours. No results for input(s): LIPASE, AMYLASE in the last 72 hours. Cardiac Enzymes No results for input(s): CKTOTAL, CKMB, CKMBINDEX, TROPONINI in the last 72 hours. BNP No results for input(s): BNP in the last 72 hours. D-Dimer No results for input(s): DDIMER in the last 72 hours. Hemoglobin A1C No results for input(s): HGBA1C in the last 72 hours. Fasting Lipid Panel No results for input(s): CHOL, HDL, LDLCALC, TRIG, CHOLHDL, LDLDIRECT in the last 72 hours. Thyroid Function Tests No results for input(s): TSH, T4TOTAL, T3FREE, THYROIDAB in the last 72 hours.  Invalid input(s): FREET3  Telemetry    Sinus rhythm with brief episodes of nonsustained SVT  ECG       Radiology    CT  ABDOMEN PELVIS W CONTRAST  Result Date: 12/29/2019 CLINICAL DATA:  Nausea, vomiting and diarrhea since last night, intermittent abdominal pain rated 4/10, history of type II diabetes mellitus, atrial fibrillation, diverticulitis, GERD, hypertension, irritable bowel syndrome, pulmonary hypertension, pulmonary fibrosis EXAM: CT ABDOMEN AND PELVIS WITH CONTRAST TECHNIQUE: Multidetector CT imaging of the abdomen and pelvis was performed using the standard protocol following bolus administration of intravenous contrast. Sagittal and coronal MPR images reconstructed from axial data set. CONTRAST:  154m OMNIPAQUE IOHEXOL 300 MG/ML SOLN IV. No oral contrast. COMPARISON:  09/04/2019 FINDINGS: Lower chest: Bibasilar pulmonary fibrosis unchanged. Hepatobiliary: Contracted gallbladder. Liver unremarkable Pancreas: Hypoechoic nodule at body of pancreas, 8 x 12 mm, grossly unchanged. No additional pancreatic abnormalities. Spleen: Several tiny nonspecific low-attenuation foci within spleen, nonspecific. Adrenals/Urinary Tract: Adrenal glands normal appearance. Cortical scarring and small cysts within both kidneys. No enhancing renal mass, hydronephrosis or urinary tract calcification. Bladder and ureters unremarkable. Stomach/Bowel: Low lying cecum in pelvis. Appendix surgically absent by history. Distal colonic diverticulosis without evidence of diverticulitis. Moderate-sized hiatal hernia. Vascular/Lymphatic: Scattered atherosclerotic calcifications aorta and iliac arteries without aneurysm. Additional calcified plaques at the origins of the celiac and superior mesenteric arteries. No adenopathy. Reproductive: Uterus surgically absent with nonvisualization of ovaries Other: No free air or free fluid. No acute inflammatory process or hernia identified. Musculoskeletal: Osseous demineralization marked compression fracture of T8 vertebral body unchanged since another CT of 10/26/2016. IMPRESSION: Distal colonic diverticulosis  without evidence of diverticulitis. Moderate-sized hiatal hernia. Stable cystic lesion at body of pancreas 8 x 12 x 14 mm. Stable bibasilar pulmonary fibrosis. Stable marked compression fracture T8 vertebral body. No acute intra-abdominal or intrapelvic abnormalities. Aortic Atherosclerosis (ICD10-I70.0). Electronically Signed   By: MLavonia DanaM.D.   On: 12/29/2019 13:09   DG Chest Portable 1 View  Result Date: 12/29/2019 CLINICAL DATA:  Pain with nausea and vomiting EXAM: PORTABLE CHEST 1 VIEW COMPARISON:  August 10, 2019 FINDINGS: There is extensive fibrosis throughout the lungs bilaterally. There is no frank edema or consolidation. Heart is upper normal in size with pulmonary vascularity normal. No adenopathy. No bone lesions. There is aortic atherosclerosis. IMPRESSION: Extensive fibrosis throughout the lungs, stable. No edema or airspace opacity. Stable cardiac silhouette. No adenopathy. Aortic Atherosclerosis (ICD10-I70.0). Electronically Signed   By: WLowella GripIII M.D.   On: 12/29/2019 13:31   ECHOCARDIOGRAM COMPLETE  Result Date: 01/03/2020    ECHOCARDIOGRAM REPORT   Patient Name:   Julie SCHMELZLEDate of Exam: 01/02/2020 Medical Rec #:  [id number]       Height:       64.0  in Accession #:    [id number]       Weight:       147.0 lb Date of Birth:  1941/05/27         BSA:          1.716 m Patient Age:    22 years         BP:           144/61 mmHg Patient Gender: F                HR:           66 bpm. Exam Location:  [facility] Procedure: 2D Echo Indications:     Abnormal ECG 794.31/ R94.31  History:         Patient has prior history of Echocardiogram examinations, most                  recent 03/26/2015.  Sonographer:     Arville Go RDCS Referring Phys:  [id number] [place] Diagnosing Phys: Bartholome Bill MD IMPRESSIONS  1. Left ventricular ejection fraction, by estimation, is 60 to 65%. The left ventricle has normal function. The left ventricle has no regional wall motion abnormalities. There is  mild asymmetric left ventricular hypertrophy of the basal-septal segment. Left ventricular diastolic parameters are consistent with Grade I diastolic dysfunction (impaired relaxation).  2. Right ventricular systolic function is normal. The right ventricular size is normal.  3. Left atrial size was mildly dilated.  4. Right atrial size was mildly dilated.  5. The mitral valve is grossly normal. Trivial mitral valve regurgitation.  6. The aortic valve is tricuspid. Aortic valve regurgitation is mild. No aortic stenosis is present. FINDINGS  Left Ventricle: Left ventricular ejection fraction, by estimation, is 60 to 65%. The left ventricle has normal function. The left ventricle has no regional wall motion abnormalities. The left ventricular internal cavity size was normal in size. There is  mild asymmetric left ventricular hypertrophy of the basal-septal segment. Left ventricular diastolic parameters are consistent with Grade I diastolic dysfunction (impaired relaxation). Right Ventricle: The right ventricular size is normal. No increase in right ventricular wall thickness. Right ventricular systolic function is normal. Left Atrium: Left atrial size was mildly dilated. Right Atrium: Right atrial size was mildly dilated. Pericardium: There is no evidence of pericardial effusion. Mitral Valve: The mitral valve is grossly normal. Trivial mitral valve regurgitation. Tricuspid Valve: The tricuspid valve is normal in structure. Tricuspid valve regurgitation is mild. Aortic Valve: The aortic valve is tricuspid. Aortic valve regurgitation is mild. Aortic regurgitation PHT measures 639 msec. No aortic stenosis is present. Aortic valve peak gradient measures 11.2 mmHg. Pulmonic Valve: The pulmonic valve was grossly normal. Pulmonic valve regurgitation is trivial. Aorta: The aortic root is normal in size and structure. IAS/Shunts: The interatrial septum was not assessed.  LEFT VENTRICLE PLAX 2D LVIDd:         4.31 cm  Diastology  LVIDs:         2.89 cm  LV e' lateral:   4.68 cm/s LV PW:         1.32 cm  LV E/e' lateral: 19.5 LV IVS:        1.14 cm  LV e' medial:    4.46 cm/s LVOT diam:     1.90 cm  LV E/e' medial:  20.4 LV SV:         67 LV SV Index:   39 LVOT Area:     2.84 cm  RIGHT VENTRICLE RV Basal diam:  3.23 cm RV S prime:     16.60 cm/s TAPSE (M-mode): 2.2 cm LEFT ATRIUM             Index       RIGHT ATRIUM           Index LA diam:        3.90 cm 2.27 cm/m  RA Area:     12.80 cm LA Vol (A2C):   25.2 ml 14.68 ml/m RA Volume:   29.60 ml  17.25 ml/m LA Vol (A4C):   37.0 ml 21.56 ml/m LA Biplane Vol: 32.7 ml 19.05 ml/m  AORTIC VALVE                PULMONIC VALVE AV Area (Vmax): 1.90 cm    PV Vmax:       1.17 m/s AV Vmax:        167.00 cm/s PV Peak grad:  5.5 mmHg AV Peak Grad:   11.2 mmHg LVOT Vmax:      112.00 cm/s LVOT Vmean:     64.500 cm/s LVOT VTI:       0.235 m AI PHT:         639 msec  AORTA Ao Root diam: 2.90 cm Ao Asc diam:  2.70 cm MITRAL VALVE                TRICUSPID VALVE MV Area (PHT): 3.42 cm     TV Peak grad:   55.2 mmHg MV Decel Time: 222 msec     TV Vmax:        3.72 m/s MV E velocity: 91.20 cm/s MV A velocity: 127.00 cm/s  SHUNTS MV E/A ratio:  0.72         Systemic VTI:  0.24 m                             Systemic Diam: 1.90 cm Bartholome Bill MD Electronically signed by Bartholome Bill MD Signature Date/Time: 01/03/2020/9:14:55 AM    Final     Assessment & Plan    1. Atrial flutter-discussed with [facility] electrophysiology. Will attempt to try Multaq 400 mg twice daily. Will do 1 more day on sotalol washout to make a total of 3.  Tomorrow a.m. Will try Multaq 400 mg twice daily.  Will need to closely follow heart rate and rhythm.  May decrease Cardizem based on heart rate response.  Consideration for amiodarone could be raised as well however interstitial lung disease would make this less ideal. If this fails, consideration for ablation. Rhythm relatively stable today.   Signed, Javier Docker Yarisbel Miranda MD 01/06/2020,  9:04 AM  Pager: [phone]

## 2020-01-06 NOTE — Progress Notes (Signed)
MD notified via secure text. Pt has been SB overnight and BP is soft. Upon assessment Pt is NSR in 70s and SBP is <100.  Orders to hold the cardizem and lasix for now. I will continue to assess.

## 2020-01-06 NOTE — TOC Progression Note (Addendum)
Transition of Care [facility]) - Progression Note    Patient Details  Name: Julie Mcmillan MRN: [id number] Date of Birth: [birth year]  Transition [facility] Care [facility]) CM/SW [place], RN Phone Number: 01/06/2020, 9:27 AM  Clinical Narrative:     Met with Patient today to discuss concern about missing her appointment this Friday.  I reassured her I will do some more research for her.  I have also called her son [name] and left a message for a return call.  Expected Discharge Plan: Assisted Living Barriers to Discharge: Continued Medical Work up  Expected Discharge Plan and Services Expected Discharge Plan: Assisted Living In-house Referral: Clinical Social Work Discharge Planning Services: CM Consult   Living arrangements for the past 2 months: Apartment                           HH Arranged: PT [place]: [place] ([facility]) Date [place]: 01/06/20 Time [place]: [phone] Representative spoke with at [place]: [place] Determinants of Health ([place]) Interventions    Readmission Risk Interventions No flowsheet data found.

## 2020-01-06 NOTE — Progress Notes (Addendum)
PROGRESS NOTE    Julie Mcmillan  OIZ:[id number] DOB: 03/29/1941 DOA: 12/30/2019 PCP: Kirk Ruths, MD       Assessment & Plan:   Principal Problem:   Gastroenteritis Active Problems:   Type 2 diabetes mellitus ([place])   Anemia, iron deficiency   Adult hypothyroidism   Hypertension associated with diabetes ([place])   PAF (paroxysmal atrial fibrillation) (HCC)   Interstitial lung disease ([place])   Gastroenteritis: with persistent nausea, vomiting. Much improved, resolving. CT abdomen/pelvis with contrast on 12/29/2019 without acute etiology.  C. difficile, GI pathogen panel, SARS-CoV-2 PCR-so far negative. Continue to hold azathioprine, colestipol, bactrim for now. Diarrhea has resolved.  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 Continue prednisone 10 mg daily. Holding azathioprine for now as above, can resume on discharge. Continue supplemental oxygen, maintaining well on home 4 L [place]  Paroxysmal atrial fibrillation: with intermittent RVR. Continue on cardizem.  IV cardizem prn for RVR. Had nonsustained VT previously. Cardiology spoke to [facility] EP, will start Multaq 400 mg twice daily after sotalol washout day 3/3.Will need 3 days of sotalol washout. If fails on Multaq, will need ablation.   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 Transfused iv iron x2 so far. Will need to follow-up with PCP for outpatient work-up. Pt verbalized their understanding   Pulmonary hypertension: continue tadalafil  Hypertension:continue diltiazem   Hypothyroidism: continue synthroid.  DM2: diet controlled. Will continue to monitor    Depression/anxiety: severity unknown. Continue fluoxetine and BuSpar.   DVT prophylaxis: eliquis Code Status: full  Family Communication:  Disposition Plan: possibly to LTAC, CM is working on this   Consultants:   cardio   Procedures:    Antimicrobials:     Subjective: Pt c/o shortness of breath   Objective: Vitals:   01/06/20 0338 01/06/20 0444 01/06/20 0525 01/06/20 0750  BP:  (!) 94/46 (!) 113/54 (!) 96/49  Pulse: 64 (!) 59 (!) 59 62  Resp:   20 19  Temp:  98.3 F (36.8 C)  98.6 F (37 C)  TempSrc:  Oral  Oral  SpO2:  100%  99%  Weight:   65.1 kg   Height:        Intake/Output Summary (Last 24 hours) at 01/06/2020 0855 Last data filed at 01/06/2020 0844 Gross per 24 hour  Intake 783 ml  Output 250 ml  Net 533 ml   Filed Weights   01/04/20 0415 01/05/20 0419 01/06/20 0525  Weight: 66 kg 64.5 kg 65.1 kg    Examination:  General exam: Appears calm and comfortable  Respiratory system: Course breath sounds b/l Cardiovascular system: irregularly irregular. No  rubs, gallops or clicks.  Gastrointestinal system: Abdomen is nondistended, soft and nontender. Normal bowel sounds heard. Central nervous system: Alert and oriented. Moves all 4 extremities Psychiatry: Judgement and insight appear normal. Mood & affect appropriate.     Data Reviewed: I have personally reviewed following labs and imaging studies  CBC: Recent Labs  Lab 12/30/19 2101 12/31/19 0454 01/01/20 0443 01/02/20 0502 01/05/20 0531  WBC 10.7* 6.6  --  7.0 10.8*  NEUTROABS 9.0*  --   --   --   --   HGB 9.3* 7.9* 7.9* 7.8* 8.7*  HCT 32.6* 26.9* 27.2* 26.1* 28.3*  MCV 72.6* 72.1*  --  71.7* 71.3*  PLT 234 183  --  213 338   Basic Metabolic Panel: Recent Labs  Lab 12/30/19 2101 12/30/19 2101 12/31/19 0454 01/01/20 0443 01/02/20  0502 01/03/20 0452 01/04/20 0528  NA 138  --  139 139  --   --  137  K 3.8   < > 3.7 3.6 4.4 4.9 4.5  CL 107  --  110 111  --   --  100  CO2 21*  --  24 23  --   --  29  GLUCOSE 178*  --  108* 135*  --   --  108*  BUN 14  --  11 8  --   --  19  CREATININE 0.93  --  0.94 0.82  --   --  0.89  CALCIUM 8.3*  --  7.7* 7.9*  --   --  8.8*  MG  --   --  1.8  --  1.9  --   --    < > = values in this interval not  displayed.   GFR: Estimated Creatinine Clearance: 43.5 mL/min (by C-G formula based on SCr of 0.89 mg/dL). Liver Function Tests: Recent Labs  Lab 12/30/19 2101  AST 17  ALT 17  ALKPHOS 60  BILITOT 0.5  PROT 5.9*  ALBUMIN 3.5   Recent Labs  Lab 12/30/19 2101  LIPASE 20   No results for input(s): AMMONIA in the last 168 hours. Coagulation Profile: No results for input(s): INR, PROTIME in the last 168 hours. Cardiac Enzymes: No results for input(s): CKTOTAL, CKMB, CKMBINDEX, TROPONINI in the last 168 hours. BNP (last 3 results) No results for input(s): PROBNP in the last 8760 hours. HbA1C: No results for input(s): HGBA1C in the last 72 hours. CBG: No results for input(s): GLUCAP in the last 168 hours. Lipid Profile: No results for input(s): CHOL, HDL, LDLCALC, TRIG, CHOLHDL, LDLDIRECT in the last 72 hours. Thyroid Function Tests: No results for input(s): TSH, T4TOTAL, FREET4, T3FREE, THYROIDAB in the last 72 hours. Anemia Panel: No results for input(s): VITAMINB12, FOLATE, FERRITIN, TIBC, IRON, RETICCTPCT in the last 72 hours. Sepsis Labs: No results for input(s): PROCALCITON, LATICACIDVEN in the last 168 hours.  Recent Results (from the past 240 hour(s))  C Difficile Quick Screen w PCR reflex     Status: None   Collection Time: 12/31/19 12:29 AM   Specimen: Stool  Result Value Ref Range Status   C Diff antigen NEGATIVE NEGATIVE Final   C Diff toxin NEGATIVE NEGATIVE Final   C Diff interpretation No C. difficile detected.  Final    Comment: Performed at [facility], [address] [place], [place] [address]  GI pathogen panel by PCR, stool     Status: None   Collection Time: 12/31/19 12:29 AM   Specimen: Stool  Result Value Ref Range Status   Plesiomonas shigelloides NOT DETECTED NOT DETECTED Final   Yersinia enterocolitica NOT DETECTED NOT DETECTED Final   Vibrio NOT DETECTED NOT DETECTED Final   Enteropathogenic E coli NOT DETECTED NOT DETECTED Final    E coli (ETEC) LT/ST NOT DETECTED NOT DETECTED Final   E coli 9758 by PCR Not applicable NOT DETECTED Final   Cryptosporidium by PCR NOT DETECTED NOT DETECTED Final   Entamoeba histolytica NOT DETECTED NOT DETECTED Final   Adenovirus F 40/41 NOT DETECTED NOT DETECTED Final   Norovirus GI/GII NOT DETECTED NOT DETECTED Final   Sapovirus NOT DETECTED NOT DETECTED Final    Comment: (NOTE) Performed At: [facility] [place], [place] [id number] Rush Farmer MD EB:[id number]    Vibrio cholerae NOT DETECTED NOT DETECTED Final   Campylobacter by  PCR NOT DETECTED NOT DETECTED Final   Salmonella by PCR NOT DETECTED NOT DETECTED Final   E coli (STEC) NOT DETECTED NOT DETECTED Final   Enteroaggregative E coli NOT DETECTED NOT DETECTED Final   Shigella by PCR NOT DETECTED NOT DETECTED Final   Cyclospora cayetanensis NOT DETECTED NOT DETECTED Final   Astrovirus NOT DETECTED NOT DETECTED Final   G lamblia by PCR NOT DETECTED NOT DETECTED Final   Rotavirus A by PCR NOT DETECTED NOT DETECTED Final  Respiratory Panel by RT PCR (Flu A&B, Covid) - Nasopharyngeal Swab     Status: None   Collection Time: 12/31/19 12:29 AM   Specimen: Nasopharyngeal Swab  Result Value Ref Range Status   SARS Coronavirus 2 by RT PCR NEGATIVE NEGATIVE Final    Comment: (NOTE) SARS-CoV-2 target nucleic acids are NOT DETECTED. The SARS-CoV-2 RNA is generally detectable in upper respiratoy specimens during the acute phase of infection. The lowest concentration of SARS-CoV-2 viral copies this assay can detect is 131 copies/mL. A negative result does not preclude SARS-Cov-2 infection and should not be used as the sole basis for treatment or other patient management decisions. A negative result may occur with  improper specimen collection/handling, submission of specimen other than nasopharyngeal swab, presence of viral mutation(s) within the areas targeted by this assay, and inadequate number of  viral copies (<131 copies/mL). A negative result must be combined with clinical observations, patient history, and epidemiological information. The expected result is Negative. Fact Sheet for Patients:  PinkCheek.be Fact Sheet for Healthcare Providers:  GravelBags.it This test is not yet ap proved or cleared by the [place] FDA and  has been authorized for detection and/or diagnosis of SARS-CoV-2 by FDA under an Emergency Use Authorization (EUA). This EUA will remain  in effect (meaning this test can be used) for the duration of the COVID-19 declaration under Section 564(b)(1) of the Act, 21 U.S.C. section 360bbb-3(b)(1), unless the authorization is terminated or revoked sooner.    Influenza A by PCR NEGATIVE NEGATIVE Final   Influenza B by PCR NEGATIVE NEGATIVE Final    Comment: (NOTE) The Xpert Xpress SARS-CoV-2/FLU/RSV assay is intended as an aid in  the diagnosis of influenza from Nasopharyngeal swab specimens and  should not be used as a sole basis for treatment. Nasal washings and  aspirates are unacceptable for Xpert Xpress SARS-CoV-2/FLU/RSV  testing. Fact Sheet for Patients: PinkCheek.be Fact Sheet for Healthcare Providers: GravelBags.it This test is not yet approved or cleared by the [place] FDA and  has been authorized for detection and/or diagnosis of SARS-CoV-2 by  FDA under an Emergency Use Authorization (EUA). This EUA will remain  in effect (meaning this test can be used) for the duration of the  Covid-19 declaration under Section 564(b)(1) of the Act, 21  U.S.C. section 360bbb-3(b)(1), unless the authorization is  terminated or revoked. Performed at [facility], [address] [place], [place] [address]          Radiology Studies: No results found.      Scheduled Meds: . apixaban  5 mg Oral BID  . busPIRone  7.5  mg Oral BID  . calcium carbonate  1 tablet Oral BID  . diltiazem  240 mg Oral Daily  . famotidine  20 mg Oral Daily  . ferrous sulfate  325 mg Oral Q breakfast  . FLUoxetine  40 mg Oral Daily  . furosemide  20 mg Oral Daily  . levothyroxine  75 mcg Oral QAC breakfast  .  off the beat book   Does not apply Once  . potassium chloride  20 mEq Oral BID  . predniSONE  10 mg Oral Q breakfast  . sodium chloride flush  3 mL Intravenous Q12H  . tadalafil (PAH)  40 mg Oral QHS  . traZODone  50 mg Oral QHS   Continuous Infusions:   LOS: 6 days    Time spent: 33 mins    Wyvonnia Dusky, MD [facility] Pager [phone]  If 7PM-7AM, please contact night-coverage [email or web address] Password [facility] 01/06/2020, 8:55 AM

## 2020-01-06 NOTE — Progress Notes (Addendum)
Patient noted to have heart rate in the 140s. VSS. Patient without complaints.  Dr. Jimmye Norman made aware. Orders received to give patient her am dose of po cardizem that was held this am due to low BP. 1830- heart rate continues to be in 140's post IV cardizem push.  Dr. Jimmye Norman aware. Patient started on cardizem drip.

## 2020-01-07 DIAGNOSIS — J8410 Pulmonary fibrosis, unspecified: Secondary | ICD-10-CM

## 2020-01-07 DIAGNOSIS — E119 Type 2 diabetes mellitus without complications: Secondary | ICD-10-CM | POA: Diagnosis not present

## 2020-01-07 DIAGNOSIS — I480 Paroxysmal atrial fibrillation: Secondary | ICD-10-CM | POA: Diagnosis not present

## 2020-01-07 LAB — BASIC METABOLIC PANEL
Anion gap: 6 (ref 5–15)
BUN: 21 mg/dL (ref 8–23)
CO2: 29 mmol/L (ref 22–32)
Calcium: 8.8 mg/dL — ABNORMAL LOW (ref 8.9–10.3)
Chloride: 100 mmol/L (ref 98–111)
Creatinine, Ser: 0.91 mg/dL (ref 0.44–1.00)
GFR calc Af Amer: >60 mL/min (ref >60)
GFR calc non Af Amer: 60 mL/min — ABNORMAL LOW (ref >60)
Glucose, Bld: 114 mg/dL — ABNORMAL HIGH (ref 70–99)
Potassium: 4.6 mmol/L (ref 3.5–5.1)
Sodium: 135 mmol/L (ref 135–145)

## 2020-01-07 LAB — CBC
HCT: 29.5 % — ABNORMAL LOW (ref 36.0–46.0)
Hemoglobin: 8.8 g/dL — ABNORMAL LOW (ref 12.0–15.0)
MCH: 22.4 pg — ABNORMAL LOW (ref 26.0–34.0)
MCHC: 29.8 g/dL — ABNORMAL LOW (ref 30.0–36.0)
MCV: 75.3 fL — ABNORMAL LOW (ref 80.0–100.0)
Platelets: 276 10*3/uL (ref 150–400)
RBC: 3.92 MIL/uL (ref 3.87–5.11)
RDW: 21.9 % — ABNORMAL HIGH (ref 11.5–15.5)
WBC: 9 10*3/uL (ref 4.0–10.5)
nRBC: 0.2 % (ref 0.0–0.2)

## 2020-01-07 MED ORDER — SODIUM CHLORIDE 0.9 % IV BOLUS
250.0000 mL | Freq: Once | INTRAVENOUS | Status: AC
  Administered 2020-01-07: 250 mL via INTRAVENOUS

## 2020-01-07 NOTE — Progress Notes (Signed)
PROGRESS NOTE    Julie Mcmillan  RVI:[id number] DOB: 06-26-1941 DOA: 12/30/2019 PCP: Kirk Ruths, MD       Assessment & Plan:   Principal Problem:   Gastroenteritis Active Problems:   Type 2 diabetes mellitus ([place])   Anemia, iron deficiency   Adult hypothyroidism   Hypertension associated with diabetes ([place])   PAF (paroxysmal atrial fibrillation) (HCC)   Interstitial lung disease ([place])   Gastroenteritis: with persistent nausea, vomiting. Much improved, resolving. CT abdomen/pelvis with contrast on 12/29/2019 without acute etiology.  C. difficile, GI pathogen panel, SARS-CoV-2 PCR-so far negative. Continue to hold azathioprine, colestipol, bactrim for now. Diarrhea has resolved.  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 Continue prednisone 10 mg daily. Holding azathioprine for now as above, can resume on discharge. Continue supplemental oxygen, maintaining well on home 4 L [place]  Paroxysmal atrial fibrillation: with intermittent RVR. Continue on cardizem. IV cardizem prn for RVR. Put on IV cardizem drip yesterday evening for RVR but pt converted to sinus rhythm this AM so drip was d/c. Had nonsustained VT previously. Cardiology spoke to [facility] EP, will start Multaq 400 mg twice daily today.Will need 3 days of sotalol washout. If fails on Multaq, will need ablation.   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 Transfused iv iron x2 so far. Will need to follow-up with PCP for outpatient work-up. Pt verbalized their understanding   Pulmonary hypertension: continue tadalafil  Hypertension:continue diltiazem   Hypothyroidism: continue synthroid.  DM2: diet controlled. Will continue to monitor   Depression/anxiety: severity unknown. Continue fluoxetine and BuSpar.   DVT prophylaxis: eliquis Code Status: full  Family Communication:  Disposition Plan: possibly to LTAC, CM is working  on this   Consultants:   cardio   Procedures:    Antimicrobials:    Subjective: Pt c/o malaise  Objective: Vitals:   01/07/20 0630 01/07/20 0644 01/07/20 0645 01/07/20 0804  BP: 101/64  (!) 110/52 (!) 108/54  Pulse:  100  72  Resp:    17  Temp:    97.9 F (36.6 C)  TempSrc:    Oral  SpO2:    99%  Weight:      Height:        Intake/Output Summary (Last 24 hours) at 01/07/2020 0815 Last data filed at 01/07/2020 0514 Gross per 24 hour  Intake 483 ml  Output 0 ml  Net 483 ml   Filed Weights   01/05/20 0419 01/06/20 0525 01/07/20 0422  Weight: 64.5 kg 65.1 kg 66.5 kg    Examination:  General exam: Appears calm and comfortable  Respiratory system: Course breath sounds b/l Cardiovascular system: S1 & S2+. No  rubs, gallops or clicks.  Gastrointestinal system: Abdomen is nondistended, soft and nontender. Hypoactive bowel sounds heard. Central nervous system: Alert and oriented. Moves all 4 extremities Psychiatry: Judgement and insight appear normal. Mood & affect appropriate.     Data Reviewed: I have personally reviewed following labs and imaging studies  CBC: Recent Labs  Lab 01/01/20 0443 01/02/20 0502 01/05/20 0531 01/07/20 0548  WBC  --  7.0 10.8* 9.0  HGB 7.9* 7.8* 8.7* 8.8*  HCT 27.2* 26.1* 28.3* 29.5*  MCV  --  71.7* 71.3* 75.3*  PLT  --  213 289 614   Basic Metabolic Panel: Recent Labs  Lab 01/01/20 0443 01/02/20 0502 01/03/20 0452 01/04/20 0528 01/07/20 0548  NA 139  --   --  137 135  K 3.6 4.4  4.9 4.5 4.6  CL 111  --   --  100 100  CO2 23  --   --  29 29  GLUCOSE 135*  --   --  108* 114*  BUN 8  --   --  19 21  CREATININE 0.82  --   --  0.89 0.91  CALCIUM 7.9*  --   --  8.8* 8.8*  MG  --  1.9  --   --   --    GFR: Estimated Creatinine Clearance: 46.2 mL/min (by C-G formula based on SCr of 0.91 mg/dL). Liver Function Tests: No results for input(s): AST, ALT, ALKPHOS, BILITOT, PROT, ALBUMIN in the last 168 hours. No results for  input(s): LIPASE, AMYLASE in the last 168 hours. No results for input(s): AMMONIA in the last 168 hours. Coagulation Profile: No results for input(s): INR, PROTIME in the last 168 hours. Cardiac Enzymes: No results for input(s): CKTOTAL, CKMB, CKMBINDEX, TROPONINI in the last 168 hours. BNP (last 3 results) No results for input(s): PROBNP in the last 8760 hours. HbA1C: No results for input(s): HGBA1C in the last 72 hours. CBG: No results for input(s): GLUCAP in the last 168 hours. Lipid Profile: No results for input(s): CHOL, HDL, LDLCALC, TRIG, CHOLHDL, LDLDIRECT in the last 72 hours. Thyroid Function Tests: No results for input(s): TSH, T4TOTAL, FREET4, T3FREE, THYROIDAB in the last 72 hours. Anemia Panel: No results for input(s): VITAMINB12, FOLATE, FERRITIN, TIBC, IRON, RETICCTPCT in the last 72 hours. Sepsis Labs: No results for input(s): PROCALCITON, LATICACIDVEN in the last 168 hours.  Recent Results (from the past 240 hour(s))  C Difficile Quick Screen w PCR reflex     Status: None   Collection Time: 12/31/19 12:29 AM   Specimen: Stool  Result Value Ref Range Status   C Diff antigen NEGATIVE NEGATIVE Final   C Diff toxin NEGATIVE NEGATIVE Final   C Diff interpretation No C. difficile detected.  Final    Comment: Performed at [facility], [address] [place], [place] [address]  GI pathogen panel by PCR, stool     Status: None   Collection Time: 12/31/19 12:29 AM   Specimen: Stool  Result Value Ref Range Status   Plesiomonas shigelloides NOT DETECTED NOT DETECTED Final   Yersinia enterocolitica NOT DETECTED NOT DETECTED Final   Vibrio NOT DETECTED NOT DETECTED Final   Enteropathogenic E coli NOT DETECTED NOT DETECTED Final   E coli (ETEC) LT/ST NOT DETECTED NOT DETECTED Final   E coli 7680 by PCR Not applicable NOT DETECTED Final   Cryptosporidium by PCR NOT DETECTED NOT DETECTED Final   Entamoeba histolytica NOT DETECTED NOT DETECTED Final   Adenovirus  F 40/41 NOT DETECTED NOT DETECTED Final   Norovirus GI/GII NOT DETECTED NOT DETECTED Final   Sapovirus NOT DETECTED NOT DETECTED Final    Comment: (NOTE) Performed At: [facility] [place], [place] [id number] Rush Farmer MD YV:[id number]    Vibrio cholerae NOT DETECTED NOT DETECTED Final   Campylobacter by PCR NOT DETECTED NOT DETECTED Final   Salmonella by PCR NOT DETECTED NOT DETECTED Final   E coli (STEC) NOT DETECTED NOT DETECTED Final   Enteroaggregative E coli NOT DETECTED NOT DETECTED Final   Shigella by PCR NOT DETECTED NOT DETECTED Final   Cyclospora cayetanensis NOT DETECTED NOT DETECTED Final   Astrovirus NOT DETECTED NOT DETECTED Final   G lamblia by PCR NOT DETECTED NOT DETECTED Final   Rotavirus A by  PCR NOT DETECTED NOT DETECTED Final  Respiratory Panel by RT PCR (Flu A&B, Covid) - Nasopharyngeal Swab     Status: None   Collection Time: 12/31/19 12:29 AM   Specimen: Nasopharyngeal Swab  Result Value Ref Range Status   SARS Coronavirus 2 by RT PCR NEGATIVE NEGATIVE Final    Comment: (NOTE) SARS-CoV-2 target nucleic acids are NOT DETECTED. The SARS-CoV-2 RNA is generally detectable in upper respiratoy specimens during the acute phase of infection. The lowest concentration of SARS-CoV-2 viral copies this assay can detect is 131 copies/mL. A negative result does not preclude SARS-Cov-2 infection and should not be used as the sole basis for treatment or other patient management decisions. A negative result may occur with  improper specimen collection/handling, submission of specimen other than nasopharyngeal swab, presence of viral mutation(s) within the areas targeted by this assay, and inadequate number of viral copies (<131 copies/mL). A negative result must be combined with clinical observations, patient history, and epidemiological information. The expected result is Negative. Fact Sheet for Patients:    PinkCheek.be Fact Sheet for Healthcare Providers:  GravelBags.it This test is not yet ap proved or cleared by the [place] FDA and  has been authorized for detection and/or diagnosis of SARS-CoV-2 by FDA under an Emergency Use Authorization (EUA). This EUA will remain  in effect (meaning this test can be used) for the duration of the COVID-19 declaration under Section 564(b)(1) of the Act, 21 U.S.C. section 360bbb-3(b)(1), unless the authorization is terminated or revoked sooner.    Influenza A by PCR NEGATIVE NEGATIVE Final   Influenza B by PCR NEGATIVE NEGATIVE Final    Comment: (NOTE) The Xpert Xpress SARS-CoV-2/FLU/RSV assay is intended as an aid in  the diagnosis of influenza from Nasopharyngeal swab specimens and  should not be used as a sole basis for treatment. Nasal washings and  aspirates are unacceptable for Xpert Xpress SARS-CoV-2/FLU/RSV  testing. Fact Sheet for Patients: PinkCheek.be Fact Sheet for Healthcare Providers: GravelBags.it This test is not yet approved or cleared by the [place] FDA and  has been authorized for detection and/or diagnosis of SARS-CoV-2 by  FDA under an Emergency Use Authorization (EUA). This EUA will remain  in effect (meaning this test can be used) for the duration of the  Covid-19 declaration under Section 564(b)(1) of the Act, 21  U.S.C. section 360bbb-3(b)(1), unless the authorization is  terminated or revoked. Performed at [facility], [address] [place],  [address]          Radiology Studies: [facility] Chest [place] 1 View  Result Date: 01/06/2020 CLINICAL DATA:  Hypoxia. EXAM: PORTABLE CHEST 1 VIEW COMPARISON:  12/29/2019 FINDINGS: Heart size is normal. Chronic fibrotic lung disease peers similar to previous studies. No evidence of acute consolidation, collapse or effusion. Low level  inflammatory changes could be hidden within the extensive chronic findings. No bone abnormality. IMPRESSION: Chronic fibrotic lung disease without visible change. Lower level inflammatory changes could be hidden within the extensive chronic findings. Electronically Signed   By: Nelson Chimes M.D.   On: 01/06/2020 12:23        Scheduled Meds: . apixaban  5 mg Oral BID  . busPIRone  7.5 mg Oral BID  . calcium carbonate  1 tablet Oral BID  . diltiazem  240 mg Oral Daily  . dronedarone  400 mg Oral BID WC  . famotidine  20 mg Oral Daily  . ferrous sulfate  325 mg Oral Q breakfast  . FLUoxetine  40 mg Oral Daily  . furosemide  20 mg Oral Daily  . levothyroxine  75 mcg Oral QAC breakfast  . off the beat book   Does not apply Once  . potassium chloride  20 mEq Oral BID  . predniSONE  10 mg Oral Q breakfast  . sodium chloride flush  3 mL Intravenous Q12H  . tadalafil (PAH)  40 mg Oral QHS  . traZODone  50 mg Oral QHS   Continuous Infusions: . sodium chloride 500 mL (01/06/20 2207)  . diltiazem (CARDIZEM) infusion 10 mg/hr (01/07/20 6004)     LOS: 7 days    Time spent: 35 mins    Wyvonnia Dusky, MD [facility] Pager [phone]  If 7PM-7AM, please contact night-coverage [email or web address] Password [facility] 01/07/2020, 8:15 AM

## 2020-01-07 NOTE — Progress Notes (Signed)
Patient Name: Julie Mcmillan Date of Encounter: 01/07/2020  Hospital Problem List     Principal Problem:   Gastroenteritis Active Problems:   Type 2 diabetes mellitus (HCC)   Anemia, iron deficiency   Adult hypothyroidism   Hypertension associated with diabetes ([place])   PAF (paroxysmal atrial fibrillation) ([place])   Interstitial lung disease ([place])    Patient Profile       79 y.o.femalewith history ofparoxysmal atrial fibrillation, pulmonary hypertension on tadalafil who presented to the emergency room with complaints of abdominal discomfort and increased diarrhea. She has had a problem with this in the past. She is being followed at [facility] for her paroxysmal A. fib and was on sotalol 80 mg twice daily for this along with Eliquis for anticoagulation and diltiazem. She states she has breakthrough A. fib 3-4 times a month. She was admitted after presenting to the emergency room with abdominal discomfort and she had atrial fibrillation episodes with rates of 144.   Subjective   No abdominal complaints.  Unaware of rapid heart rate  Inpatient Medications    . apixaban  5 mg Oral BID  . busPIRone  7.5 mg Oral BID  . calcium carbonate  1 tablet Oral BID  . diltiazem  240 mg Oral Daily  . dronedarone  400 mg Oral BID WC  . famotidine  20 mg Oral Daily  . ferrous sulfate  325 mg Oral Q breakfast  . FLUoxetine  40 mg Oral Daily  . furosemide  20 mg Oral Daily  . levothyroxine  75 mcg Oral QAC breakfast  . off the beat book   Does not apply Once  . potassium chloride  20 mEq Oral BID  . predniSONE  10 mg Oral Q breakfast  . sodium chloride flush  3 mL Intravenous Q12H  . tadalafil (PAH)  40 mg Oral QHS  . traZODone  50 mg Oral QHS    Vital Signs    Vitals:   01/07/20 0945 01/07/20 1000 01/07/20 1115 01/07/20 1550  BP: (!) 123/40 127/65 (!) 117/51 (!) 113/58  Pulse: 72 74 70 68  Resp: _0 Temp:   97.7 F (36.5 C) (!) 97.4 F (36.3  C)  TempSrc:   Oral Oral  SpO2: 100% 100% 100% 98%  Weight:      Height:        Intake/Output Summary (Last 24 hours) at 01/07/2020 1630 Last data filed at 01/07/2020 1559 Gross per 24 hour  Intake 580 ml  Output 1100 ml  Net -520 ml   Filed Weights   01/05/20 0419 01/06/20 0525 01/07/20 0422  Weight: 64.5 kg 65.1 kg 66.5 kg    Physical Exam    GEN: Well nourished, well developed, in no acute distress.  HEENT: normal.  Neck: Supple, no JVD, carotid bruits, or masses. Cardiac: RRR, no murmurs, rubs, or gallops. No clubbing, cyanosis, edema.  Radials/DP/PT 2+ and equal bilaterally.  Respiratory:  Respirations regular and unlabored, clear to auscultation bilaterally. GI: Soft, nontender, nondistended, BS + x 4. MS: no deformity or atrophy. Skin: warm and dry, no rash. Neuro:  Strength and sensation are intact. Psych: Normal affect.  Labs    CBC Recent Labs    01/05/20 0531 01/07/20 0548  WBC 10.8* 9.0  HGB 8.7* 8.8*  HCT 28.3* 29.5*  MCV 71.3* 75.3*  PLT 289 161   Basic Metabolic Panel Recent Labs    01/07/20 0548  NA 135  K 4.6  CL 100  CO2 29  GLUCOSE 114*  BUN 21  CREATININE 0.91  CALCIUM 8.8*   Liver Function Tests No results for input(s): AST, ALT, ALKPHOS, BILITOT, PROT, ALBUMIN in the last 72 hours. No results for input(s): LIPASE, AMYLASE in the last 72 hours. Cardiac Enzymes No results for input(s): CKTOTAL, CKMB, CKMBINDEX, TROPONINI in the last 72 hours. BNP No results for input(s): BNP in the last 72 hours. D-Dimer No results for input(s): DDIMER in the last 72 hours. Hemoglobin A1C No results for input(s): HGBA1C in the last 72 hours. Fasting Lipid Panel No results for input(s): CHOL, HDL, LDLCALC, TRIG, CHOLHDL, LDLDIRECT in the last 72 hours. Thyroid Function Tests No results for input(s): TSH, T4TOTAL, T3FREE, THYROIDAB in the last 72 hours.  Invalid input(s): FREET3  Telemetry    Sinus rhythm  ECG       Radiology    CT  ABDOMEN PELVIS W CONTRAST  Result Date: 12/29/2019 CLINICAL DATA:  Nausea, vomiting and diarrhea since last night, intermittent abdominal pain rated 4/10, history of type II diabetes mellitus, atrial fibrillation, diverticulitis, GERD, hypertension, irritable bowel syndrome, pulmonary hypertension, pulmonary fibrosis EXAM: CT ABDOMEN AND PELVIS WITH CONTRAST TECHNIQUE: Multidetector CT imaging of the abdomen and pelvis was performed using the standard protocol following bolus administration of intravenous contrast. Sagittal and coronal MPR images reconstructed from axial data set. CONTRAST:  119m OMNIPAQUE IOHEXOL 300 MG/ML SOLN IV. No oral contrast. COMPARISON:  09/04/2019 FINDINGS: Lower chest: Bibasilar pulmonary fibrosis unchanged. Hepatobiliary: Contracted gallbladder. Liver unremarkable Pancreas: Hypoechoic nodule at body of pancreas, 8 x 12 mm, grossly unchanged. No additional pancreatic abnormalities. Spleen: Several tiny nonspecific low-attenuation foci within spleen, nonspecific. Adrenals/Urinary Tract: Adrenal glands normal appearance. Cortical scarring and small cysts within both kidneys. No enhancing renal mass, hydronephrosis or urinary tract calcification. Bladder and ureters unremarkable. Stomach/Bowel: Low lying cecum in pelvis. Appendix surgically absent by history. Distal colonic diverticulosis without evidence of diverticulitis. Moderate-sized hiatal hernia. Vascular/Lymphatic: Scattered atherosclerotic calcifications aorta and iliac arteries without aneurysm. Additional calcified plaques at the origins of the celiac and superior mesenteric arteries. No adenopathy. Reproductive: Uterus surgically absent with nonvisualization of ovaries Other: No free air or free fluid. No acute inflammatory process or hernia identified. Musculoskeletal: Osseous demineralization marked compression fracture of T8 vertebral body unchanged since another CT of 10/26/2016. IMPRESSION: Distal colonic diverticulosis  without evidence of diverticulitis. Moderate-sized hiatal hernia. Stable cystic lesion at body of pancreas 8 x 12 x 14 mm. Stable bibasilar pulmonary fibrosis. Stable marked compression fracture T8 vertebral body. No acute intra-abdominal or intrapelvic abnormalities. Aortic Atherosclerosis (ICD10-I70.0). Electronically Signed   By: MLavonia DanaM.D.   On: 12/29/2019 13:09   DG Chest Port 1 View  Result Date: 01/06/2020 CLINICAL DATA:  Hypoxia. EXAM: PORTABLE CHEST 1 VIEW COMPARISON:  12/29/2019 FINDINGS: Heart size is normal. Chronic fibrotic lung disease peers similar to previous studies. No evidence of acute consolidation, collapse or effusion. Low level inflammatory changes could be hidden within the extensive chronic findings. No bone abnormality. IMPRESSION: Chronic fibrotic lung disease without visible change. Lower level inflammatory changes could be hidden within the extensive chronic findings. Electronically Signed   By: MNelson ChimesM.D.   On: 01/06/2020 12:23   DG Chest Portable 1 View  Result Date: 12/29/2019 CLINICAL DATA:  Pain with nausea and vomiting EXAM: PORTABLE CHEST 1 VIEW COMPARISON:  August 10, 2019 FINDINGS: There is extensive fibrosis throughout the lungs bilaterally. There is no frank edema or consolidation.  Heart is upper normal in size with pulmonary vascularity normal. No adenopathy. No bone lesions. There is aortic atherosclerosis. IMPRESSION: Extensive fibrosis throughout the lungs, stable. No edema or airspace opacity. Stable cardiac silhouette. No adenopathy. Aortic Atherosclerosis (ICD10-I70.0). Electronically Signed   By: Lowella Grip III M.D.   On: 12/29/2019 13:31   ECHOCARDIOGRAM COMPLETE  Result Date: 01/03/2020    ECHOCARDIOGRAM REPORT   Patient Name:   Julie Mcmillan Date of Exam: 01/02/2020 Medical Rec #:  [id number]        Height:       64.0 in Accession #:    [id number]       Weight:       147.0 lb Date of Birth:  [birth year]         BSA:          1.716 m  Patient Age:    71 years         BP:           144/61 mmHg Patient Gender: F                HR:           66 bpm. Exam Location:  [facility] Procedure: 2D Echo Indications:     Abnormal ECG 794.31/ R94.31  History:         Patient has prior history of Echocardiogram examinations, most                  recent 03/26/2015.  Sonographer:     Arville Go RDCS Referring Phys:  [id number] [employer] Diagnosing Phys: Bartholome Bill MD IMPRESSIONS  1. Left ventricular ejection fraction, by estimation, is 60 to 65%. The left ventricle has normal function. The left ventricle has no regional wall motion abnormalities. There is mild asymmetric left ventricular hypertrophy of the basal-septal segment. Left ventricular diastolic parameters are consistent with Grade I diastolic dysfunction (impaired relaxation).  2. Right ventricular systolic function is normal. The right ventricular size is normal.  3. Left atrial size was mildly dilated.  4. Right atrial size was mildly dilated.  5. The mitral valve is grossly normal. Trivial mitral valve regurgitation.  6. The aortic valve is tricuspid. Aortic valve regurgitation is mild. No aortic stenosis is present. FINDINGS  Left Ventricle: Left ventricular ejection fraction, by estimation, is 60 to 65%. The left ventricle has normal function. The left ventricle has no regional wall motion abnormalities. The left ventricular internal cavity size was normal in size. There is  mild asymmetric left ventricular hypertrophy of the basal-septal segment. Left ventricular diastolic parameters are consistent with Grade I diastolic dysfunction (impaired relaxation). Right Ventricle: The right ventricular size is normal. No increase in right ventricular wall thickness. Right ventricular systolic function is normal. Left Atrium: Left atrial size was mildly dilated. Right Atrium: Right atrial size was mildly dilated. Pericardium: There is no evidence of pericardial effusion. Mitral Valve: The mitral valve is  grossly normal. Trivial mitral valve regurgitation. Tricuspid Valve: The tricuspid valve is normal in structure. Tricuspid valve regurgitation is mild. Aortic Valve: The aortic valve is tricuspid. Aortic valve regurgitation is mild. Aortic regurgitation PHT measures 639 msec. No aortic stenosis is present. Aortic valve peak gradient measures 11.2 mmHg. Pulmonic Valve: The pulmonic valve was grossly normal. Pulmonic valve regurgitation is trivial. Aorta: The aortic root is normal in size and structure. IAS/Shunts: The interatrial septum was not assessed.  LEFT VENTRICLE PLAX 2D LVIDd:  4.31 cm  Diastology LVIDs:         2.89 cm  LV e' lateral:   4.68 cm/s LV PW:         1.32 cm  LV E/e' lateral: 19.5 LV IVS:        1.14 cm  LV e' medial:    4.46 cm/s LVOT diam:     1.90 cm  LV E/e' medial:  20.4 LV SV:         67 LV SV Index:   39 LVOT Area:     2.84 cm  RIGHT VENTRICLE RV Basal diam:  3.23 cm RV S prime:     16.60 cm/s TAPSE (M-mode): 2.2 cm LEFT ATRIUM             Index       RIGHT ATRIUM           Index LA diam:        3.90 cm 2.27 cm/m  RA Area:     12.80 cm LA Vol (A2C):   25.2 ml 14.68 ml/m RA Volume:   29.60 ml  17.25 ml/m LA Vol (A4C):   37.0 ml 21.56 ml/m LA Biplane Vol: 32.7 ml 19.05 ml/m  AORTIC VALVE                PULMONIC VALVE AV Area (Vmax): 1.90 cm    PV Vmax:       1.17 m/s AV Vmax:        167.00 cm/s PV Peak grad:  5.5 mmHg AV Peak Grad:   11.2 mmHg LVOT Vmax:      112.00 cm/s LVOT Vmean:     64.500 cm/s LVOT VTI:       0.235 m AI PHT:         639 msec  AORTA Ao Root diam: 2.90 cm Ao Asc diam:  2.70 cm MITRAL VALVE                TRICUSPID VALVE MV Area (PHT): 3.42 cm     TV Peak grad:   55.2 mmHg MV Decel Time: 222 msec     TV Vmax:        3.72 m/s MV E velocity: 91.20 cm/s MV A velocity: 127.00 cm/s  SHUNTS MV E/A ratio:  0.72         Systemic VTI:  0.24 m                             Systemic Diam: 1.90 cm Bartholome Bill MD Electronically signed by Bartholome Bill MD Signature  Date/Time: 01/03/2020/9:14:55 AM    Final     Assessment & Plan    1.Atrial flutter-discussed with electrophysiology. Has switched to Multaq 400 mg twice daily.  Will need to closely follow heart rate and rhythm.  May decrease Cardizem based on heart rate response.  Consideration for amiodarone could be raised as well however interstitial lung disease would make this less ideal. If this fails, consideration for ablation. Rhythm relatively stable today.   Tolerating this changes okay thus far.  If stable overnight, okay for discharge from a cardiac standpoint.  Continue with apixaban   Signed, Javier Docker. Milfred Krammes MD 01/07/2020, 4:30 PM  Pager: [phone]

## 2020-01-07 NOTE — Care Management Important Message (Signed)
Important Message  Patient Details  Name: Julie Mcmillan MRN: [id number] Date of Birth: 09-24-1941   Medicare Important Message Given:  Yes     Dannette Barbara 01/07/2020, 2:01 PM

## 2020-01-08 DIAGNOSIS — E119 Type 2 diabetes mellitus without complications: Secondary | ICD-10-CM | POA: Diagnosis not present

## 2020-01-08 DIAGNOSIS — J8410 Pulmonary fibrosis, unspecified: Secondary | ICD-10-CM | POA: Diagnosis not present

## 2020-01-08 DIAGNOSIS — I480 Paroxysmal atrial fibrillation: Secondary | ICD-10-CM | POA: Diagnosis not present

## 2020-01-08 LAB — CBC
HCT: 29.5 % — ABNORMAL LOW (ref 36.0–46.0)
Hemoglobin: 8.9 g/dL — ABNORMAL LOW (ref 12.0–15.0)
MCH: 22.7 pg — ABNORMAL LOW (ref 26.0–34.0)
MCHC: 30.2 g/dL (ref 30.0–36.0)
MCV: 75.3 fL — ABNORMAL LOW (ref 80.0–100.0)
Platelets: 276 10*3/uL (ref 150–400)
RBC: 3.92 MIL/uL (ref 3.87–5.11)
RDW: 23.8 % — ABNORMAL HIGH (ref 11.5–15.5)
WBC: 8.9 10*3/uL (ref 4.0–10.5)
nRBC: 0.2 % (ref 0.0–0.2)

## 2020-01-08 LAB — BASIC METABOLIC PANEL
Anion gap: 9 (ref 5–15)
BUN: 19 mg/dL (ref 8–23)
CO2: 27 mmol/L (ref 22–32)
Calcium: 8.7 mg/dL — ABNORMAL LOW (ref 8.9–10.3)
Chloride: 99 mmol/L (ref 98–111)
Creatinine, Ser: 0.99 mg/dL (ref 0.44–1.00)
GFR calc Af Amer: >60 mL/min (ref >60)
GFR calc non Af Amer: 54 mL/min — ABNORMAL LOW (ref >60)
Glucose, Bld: 98 mg/dL (ref 70–99)
Potassium: 4.4 mmol/L (ref 3.5–5.1)
Sodium: 135 mmol/L (ref 135–145)

## 2020-01-08 NOTE — Progress Notes (Signed)
Patient Name: Julie Mcmillan Date of Encounter: 01/08/2020  Hospital Problem List     Principal Problem:   Gastroenteritis Active Problems:   Type 2 diabetes mellitus (HCC)   Anemia, iron deficiency   Adult hypothyroidism   Hypertension associated with diabetes ([place])   PAF (paroxysmal atrial fibrillation) ([place])   Interstitial lung disease ([place])    Patient Profile         79 y.o.femalewith history ofparoxysmal atrial fibrillation, pulmonary hypertension on tadalafil who presented to the emergency room with complaints of abdominal discomfort and increased diarrhea. She has had a problem with this in the past. She had been  followed at [facility] for her paroxysmal A. fib andwason sotalol 80 mg twice daily for this along with Eliquis for anticoagulation and diltiazem. She states she has breakthrough A. fib 3-4 times a month. She was admitted after presenting to the emergency room with abdominal discomfort and she had atrial fibrillation episodes with rates of 144. She was taken off of sotolol and converted to Multaq after washout. Continued with cardizem. On eliquis for anticoagulation  Subjective     Inpatient Medications    . apixaban  5 mg Oral BID  . busPIRone  7.5 mg Oral BID  . calcium carbonate  1 tablet Oral BID  . diltiazem  240 mg Oral Daily  . dronedarone  400 mg Oral BID WC  . famotidine  20 mg Oral Daily  . ferrous sulfate  325 mg Oral Q breakfast  . FLUoxetine  40 mg Oral Daily  . furosemide  20 mg Oral Daily  . levothyroxine  75 mcg Oral QAC breakfast  . off the beat book   Does not apply Once  . potassium chloride  20 mEq Oral BID  . predniSONE  10 mg Oral Q breakfast  . sodium chloride flush  3 mL Intravenous Q12H  . tadalafil (PAH)  40 mg Oral QHS  . traZODone  50 mg Oral QHS    Vital Signs    Vitals:   01/07/20 1115 01/07/20 1550 01/07/20 1936 01/08/20 0408  BP: (!) 117/51 (!) 113/58 (!) 126/56 (!) 159/67  Pulse: 70  68 76 88  Resp: 16 16    Temp: 97.7 F (36.5 C) (!) 97.4 F (36.3 C) 98 F (36.7 C) 98.1 F (36.7 C)  TempSrc: Oral Oral Oral Oral  SpO2: 100% 98% 99% 95%  Weight:    65.9 kg  Height:        Intake/Output Summary (Last 24 hours) at 01/08/2020 0756 Last data filed at 01/08/2020 0538 Gross per 24 hour  Intake 820 ml  Output 1400 ml  Net -580 ml   Filed Weights   01/06/20 0525 01/07/20 0422 01/08/20 0408  Weight: 65.1 kg 66.5 kg 65.9 kg    Physical Exam      Labs    CBC Recent Labs    01/07/20 0548  WBC 9.0  HGB 8.8*  HCT 29.5*  MCV 75.3*  PLT 099   Basic Metabolic Panel Recent Labs    01/07/20 0548  NA 135  K 4.6  CL 100  CO2 29  GLUCOSE 114*  BUN 21  CREATININE 0.91  CALCIUM 8.8*   Liver Function Tests No results for input(s): AST, ALT, ALKPHOS, BILITOT, PROT, ALBUMIN in the last 72 hours. No results for input(s): LIPASE, AMYLASE in the last 72 hours. Cardiac Enzymes No results for input(s): CKTOTAL, CKMB, CKMBINDEX, TROPONINI in the last 72  hours. BNP No results for input(s): BNP in the last 72 hours. D-Dimer No results for input(s): DDIMER in the last 72 hours. Hemoglobin A1C No results for input(s): HGBA1C in the last 72 hours. Fasting Lipid Panel No results for input(s): CHOL, HDL, LDLCALC, TRIG, CHOLHDL, LDLDIRECT in the last 72 hours. Thyroid Function Tests No results for input(s): TSH, T4TOTAL, T3FREE, THYROIDAB in the last 72 hours.  Invalid input(s): FREET3  Telemetry    nsr  ECG       Radiology    CT ABDOMEN PELVIS W CONTRAST  Result Date: 12/29/2019 CLINICAL DATA:  Nausea, vomiting and diarrhea since last night, intermittent abdominal pain rated 4/10, history of type II diabetes mellitus, atrial fibrillation, diverticulitis, GERD, hypertension, irritable bowel syndrome, pulmonary hypertension, pulmonary fibrosis EXAM: CT ABDOMEN AND PELVIS WITH CONTRAST TECHNIQUE: Multidetector CT imaging of the abdomen and pelvis was performed  using the standard protocol following bolus administration of intravenous contrast. Sagittal and coronal MPR images reconstructed from axial data set. CONTRAST:  173m OMNIPAQUE IOHEXOL 300 MG/ML SOLN IV. No oral contrast. COMPARISON:  09/04/2019 FINDINGS: Lower chest: Bibasilar pulmonary fibrosis unchanged. Hepatobiliary: Contracted gallbladder. Liver unremarkable Pancreas: Hypoechoic nodule at body of pancreas, 8 x 12 mm, grossly unchanged. No additional pancreatic abnormalities. Spleen: Several tiny nonspecific low-attenuation foci within spleen, nonspecific. Adrenals/Urinary Tract: Adrenal glands normal appearance. Cortical scarring and small cysts within both kidneys. No enhancing renal mass, hydronephrosis or urinary tract calcification. Bladder and ureters unremarkable. Stomach/Bowel: Low lying cecum in pelvis. Appendix surgically absent by history. Distal colonic diverticulosis without evidence of diverticulitis. Moderate-sized hiatal hernia. Vascular/Lymphatic: Scattered atherosclerotic calcifications aorta and iliac arteries without aneurysm. Additional calcified plaques at the origins of the celiac and superior mesenteric arteries. No adenopathy. Reproductive: Uterus surgically absent with nonvisualization of ovaries Other: No free air or free fluid. No acute inflammatory process or hernia identified. Musculoskeletal: Osseous demineralization marked compression fracture of T8 vertebral body unchanged since another CT of 10/26/2016. IMPRESSION: Distal colonic diverticulosis without evidence of diverticulitis. Moderate-sized hiatal hernia. Stable cystic lesion at body of pancreas 8 x 12 x 14 mm. Stable bibasilar pulmonary fibrosis. Stable marked compression fracture T8 vertebral body. No acute intra-abdominal or intrapelvic abnormalities. Aortic Atherosclerosis (ICD10-I70.0). Electronically Signed   By: MLavonia DanaM.D.   On: 12/29/2019 13:09   DG Chest Port 1 View  Result Date: 01/06/2020 CLINICAL DATA:   Hypoxia. EXAM: PORTABLE CHEST 1 VIEW COMPARISON:  12/29/2019 FINDINGS: Heart size is normal. Chronic fibrotic lung disease peers similar to previous studies. No evidence of acute consolidation, collapse or effusion. Low level inflammatory changes could be hidden within the extensive chronic findings. No bone abnormality. IMPRESSION: Chronic fibrotic lung disease without visible change. Lower level inflammatory changes could be hidden within the extensive chronic findings. Electronically Signed   By: MNelson ChimesM.D.   On: 01/06/2020 12:23   DG Chest Portable 1 View  Result Date: 12/29/2019 CLINICAL DATA:  Pain with nausea and vomiting EXAM: PORTABLE CHEST 1 VIEW COMPARISON:  August 10, 2019 FINDINGS: There is extensive fibrosis throughout the lungs bilaterally. There is no frank edema or consolidation. Heart is upper normal in size with pulmonary vascularity normal. No adenopathy. No bone lesions. There is aortic atherosclerosis. IMPRESSION: Extensive fibrosis throughout the lungs, stable. No edema or airspace opacity. Stable cardiac silhouette. No adenopathy. Aortic Atherosclerosis (ICD10-I70.0). Electronically Signed   By: WLowella GripIII M.D.   On: 12/29/2019 13:31   ECHOCARDIOGRAM COMPLETE  Result Date: 01/03/2020    ECHOCARDIOGRAM  REPORT   Patient Name:   Julie Mcmillan Date of Exam: 01/02/2020 Medical Rec #:  [id number]        Height:       64.0 in Accession #:    [id number]       Weight:       147.0 lb Date of Birth:  Mar 30, 1941         BSA:          1.716 m Patient Age:    89 years         BP:           144/61 mmHg Patient Gender: F                HR:           66 bpm. Exam Location:  [facility] Procedure: 2D Echo Indications:     Abnormal ECG 794.31/ R94.31  History:         Patient has prior history of Echocardiogram examinations, most                  recent 03/26/2015.  Sonographer:     Arville Go RDCS Referring Phys:  [id number] Wallace Diagnosing Phys: Bartholome Bill MD IMPRESSIONS  1. Left  ventricular ejection fraction, by estimation, is 60 to 65%. The left ventricle has normal function. The left ventricle has no regional wall motion abnormalities. There is mild asymmetric left ventricular hypertrophy of the basal-septal segment. Left ventricular diastolic parameters are consistent with Grade I diastolic dysfunction (impaired relaxation).  2. Right ventricular systolic function is normal. The right ventricular size is normal.  3. Left atrial size was mildly dilated.  4. Right atrial size was mildly dilated.  5. The mitral valve is grossly normal. Trivial mitral valve regurgitation.  6. The aortic valve is tricuspid. Aortic valve regurgitation is mild. No aortic stenosis is present. FINDINGS  Left Ventricle: Left ventricular ejection fraction, by estimation, is 60 to 65%. The left ventricle has normal function. The left ventricle has no regional wall motion abnormalities. The left ventricular internal cavity size was normal in size. There is  mild asymmetric left ventricular hypertrophy of the basal-septal segment. Left ventricular diastolic parameters are consistent with Grade I diastolic dysfunction (impaired relaxation). Right Ventricle: The right ventricular size is normal. No increase in right ventricular wall thickness. Right ventricular systolic function is normal. Left Atrium: Left atrial size was mildly dilated. Right Atrium: Right atrial size was mildly dilated. Pericardium: There is no evidence of pericardial effusion. Mitral Valve: The mitral valve is grossly normal. Trivial mitral valve regurgitation. Tricuspid Valve: The tricuspid valve is normal in structure. Tricuspid valve regurgitation is mild. Aortic Valve: The aortic valve is tricuspid. Aortic valve regurgitation is mild. Aortic regurgitation PHT measures 639 msec. No aortic stenosis is present. Aortic valve peak gradient measures 11.2 mmHg. Pulmonic Valve: The pulmonic valve was grossly normal. Pulmonic valve regurgitation is  trivial. Aorta: The aortic root is normal in size and structure. IAS/Shunts: The interatrial septum was not assessed.  LEFT VENTRICLE PLAX 2D LVIDd:         4.31 cm  Diastology LVIDs:         2.89 cm  LV e' lateral:   4.68 cm/s LV PW:         1.32 cm  LV E/e' lateral: 19.5 LV IVS:        1.14 cm  LV e' medial:    4.46 cm/s LVOT diam:  1.90 cm  LV E/e' medial:  20.4 LV SV:         67 LV SV Index:   39 LVOT Area:     2.84 cm  RIGHT VENTRICLE RV Basal diam:  3.23 cm RV S prime:     16.60 cm/s TAPSE (M-mode): 2.2 cm LEFT ATRIUM             Index       RIGHT ATRIUM           Index LA diam:        3.90 cm 2.27 cm/m  RA Area:     12.80 cm LA Vol (A2C):   25.2 ml 14.68 ml/m RA Volume:   29.60 ml  17.25 ml/m LA Vol (A4C):   37.0 ml 21.56 ml/m LA Biplane Vol: 32.7 ml 19.05 ml/m  AORTIC VALVE                PULMONIC VALVE AV Area (Vmax): 1.90 cm    PV Vmax:       1.17 m/s AV Vmax:        167.00 cm/s PV Peak grad:  5.5 mmHg AV Peak Grad:   11.2 mmHg LVOT Vmax:      112.00 cm/s LVOT Vmean:     64.500 cm/s LVOT VTI:       0.235 m AI PHT:         639 msec  AORTA Ao Root diam: 2.90 cm Ao Asc diam:  2.70 cm MITRAL VALVE                TRICUSPID VALVE MV Area (PHT): 3.42 cm     TV Peak grad:   55.2 mmHg MV Decel Time: 222 msec     TV Vmax:        3.72 m/s MV E velocity: 91.20 cm/s MV A velocity: 127.00 cm/s  SHUNTS MV E/A ratio:  0.72         Systemic VTI:  0.24 m                             Systemic Diam: 1.90 cm Bartholome Bill MD Electronically signed by Bartholome Bill MD Signature Date/Time: 01/03/2020/9:14:55 AM    Final     Assessment & Plan    1.Atrial flutter-discussed with electrophysiology. Has switched to Multaq 400 mg twice daily. Appears to be controlling rhythm and is well tolerated.  Consideration for amiodarone could be raised as well however interstitial lung disease would make this less ideal. If this fails, consideration for ablation. Rhythm relatively stable today.  Would discharge on Multaq 400  bid, cardizemCD 240 mg dialy, apixaban 5 bid, lasix 20 mg daily, kdur 20 meq daily. F/u in 1 week.   2. Pulmonary fibrosis-continue with tadalafil 40 mg q hs.    Signed, Javier Docker Johnsie Moscoso MD 01/08/2020, 7:56 AM  Pager: [phone]

## 2020-01-08 NOTE — Progress Notes (Signed)
Physical Therapy Treatment Patient Details Name: Julie Mcmillan MRN: [id number] DOB: [birth year] Today's Date: 01/08/2020    History of Present Illness 79 y.o. female with history of paroxysmal atrial fibrillation, pulmonary hypertension on tadalafil who presented to the emergency room with complaints of abdominal discomfort and increased diarrhea.  She has had a problem with this in the past.  She is being followed at [facility] for her paroxysmal A. fib. PLOF ind without AD    PT Comments    Pt is making good progress towards goals with ability to ambulate in hallway. Pt reports she is perkier than previous date, however somewhat self limiting and doesn't think she is capable of much. With encouragement, she is able to participate with supervision for most task. Pt does fatigue with hallway ambulation. Pt tired of using BSC and asking if she can use the regular bathroom. Ambulated to bathroom safely with ability to transfer on/off toliet. Encouraged her to take initiation in her mobility with RN staff and request to ambulate to bathroom instead of BSC. Vitals stable with mobility efforts. Will continue to progress as able.   Follow Up Recommendations  Home health PT     Equipment Recommendations  Rolling walker with 5" wheels    Recommendations for Other Services       Precautions / Restrictions Precautions Precautions: Fall Restrictions Weight Bearing Restrictions: No    Mobility  Bed Mobility Overal bed mobility: Needs Assistance Bed Mobility: Supine to Sit   Sidelying to sit: Independent       General bed mobility comments: able to come easily to EOB. Once seated, able to sit with upright posture  Transfers Overall transfer level: Needs assistance Equipment used: Rolling walker (2 wheeled) Transfers: Sit to/from Stand Sit to Stand: Supervision         General transfer comment: safe technique. All mobility performed on 4L of  O2  Ambulation/Gait Ambulation/Gait assistance: Supervision Gait Distance (Feet): 140 Feet Assistive device: Rolling walker (2 wheeled) Gait Pattern/deviations: Step-through pattern     General Gait Details: ambulated in hallway, no rest break needed this date. HR at 102bpm with exertion and O2 sats at 94% on 4L of O2 post ambulation. Fatigues quickly, however no unsteadiness or physical assist required   Stairs             Wheelchair Mobility    Modified Rankin (Stroke Patients Only)       Balance Overall balance assessment: Needs assistance Sitting-balance support: Feet supported Sitting balance-Leahy Scale: Good     Standing balance support: Bilateral upper extremity supported Standing balance-Leahy Scale: Good                              Cognition Arousal/Alertness: Awake/alert Behavior During Therapy: WFL for tasks assessed/performed Overall Cognitive Status: Within Functional Limits for tasks assessed                                        Exercises Other Exercises Other Exercises: supine ther-ex including B LE quad sets, SLRs, hip abd/add, and SAQ. All ther-ex performed x 15 reps with supervision. Vitals assessed Other Exercises: Ambulated to bathroom with supervision and safe technique. RW used and pt able to transfer on/off toilet safely.    General Comments        Pertinent Vitals/Pain Pain Assessment:  No/denies pain    Home Living                      Prior Function            PT Goals (current goals can now be found in the care plan section) Acute Rehab PT Goals Patient Stated Goal: go home PT Goal Formulation: With patient Time For Goal Achievement: 01/16/20 Potential to Achieve Goals: Good Progress towards PT goals: Progressing toward goals    Frequency    Min 2X/week      PT Plan Current plan remains appropriate    Co-evaluation              AM-PAC PT "6 Clicks" Mobility    Outcome Measure  Help needed turning from your back to your side while in a flat bed without using bedrails?: None Help needed moving from lying on your back to sitting on the side of a flat bed without using bedrails?: None Help needed moving to and from a bed to a chair (including a wheelchair)?: None Help needed standing up from a chair using your arms (e.g., wheelchair or bedside chair)?: None Help needed to walk in hospital room?: None Help needed climbing 3-5 steps with a railing? : A Little 6 Click Score: 23    End of Session Equipment Utilized During Treatment: Gait belt;Oxygen Activity Tolerance: Patient tolerated treatment well Patient left: in chair;with chair alarm set Nurse Communication: Mobility status PT Visit Diagnosis: Other abnormalities of gait and mobility (R26.89);Unsteadiness on feet (R26.81);Muscle weakness (generalized) (M62.81)     Time: 8485-9276 PT Time Calculation (min) (ACUTE ONLY): 38 min  Charges:  $Gait Training: 8-22 mins $Therapeutic Exercise: 8-22 mins $Therapeutic Activity: 8-22 mins                     Greggory Stallion, PT, DPT [phone]    Julie Mcmillan 01/08/2020, 1:39 PM

## 2020-01-08 NOTE — Progress Notes (Signed)
PROGRESS NOTE    Julie Mcmillan  PPI:[id number] DOB: September 12, 1941 DOA: 12/30/2019 PCP: Kirk Ruths, MD       Assessment & Plan:   Principal Problem:   Gastroenteritis Active Problems:   Type 2 diabetes mellitus ([place])   Anemia, iron deficiency   Adult hypothyroidism   Hypertension associated with diabetes ([place])   PAF (paroxysmal atrial fibrillation) ([place])   Interstitial lung disease ([place])   Gastroenteritis: Resolved. CT abdomen/pelvis with contrast on 12/29/2019 without acute etiology.  C. difficile, GI pathogen panel, SARS-CoV-2 PCR-so far negative. Continue to hold azathioprine, colestipol, bactrim for now. Diarrhea has resolved.  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 Continue prednisone 10 mg daily. Holding azathioprine for now as above, can resume on discharge. Continue supplemental oxygen, maintaining well on home 4 L [place]  Paroxysmal atrial fibrillation: with intermittent RVR. Continue on cardizem. IV cardizem prn for RVR. Put on IV cardizem drip 01/06/20 evening for RVR but pt converted to sinus rhythm so this was d/c. Had nonsustained VT previously. Cardiology spoke to [place], will continue on Multaq 400 mg twice daily. If fails on Multaq, will need ablation.   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 Transfused iv iron x2 so far. Will need to follow-up with PCP for outpatient work-up. Pt verbalized their understanding   Pulmonary hypertension: continue tadalafil  Hypertension:continue diltiazem   Hypothyroidism: continue synthroid.  DM2: diet controlled. Will continue to monitor   Depression/anxiety: severity unknown. Continue fluoxetine and BuSpar.   DVT prophylaxis: eliquis Code Status: full  Family Communication:  Disposition Plan: possibly to LTAC, CM is working on this but therapy is recommending home health. Pt feels like she unable to care for herself at  home alone   Consultants:   cardio   Procedures:    Antimicrobials:    Subjective: Pt c/o fatigue  Objective: Vitals:   01/07/20 1115 01/07/20 1550 01/07/20 1936 01/08/20 0408  BP: (!) 117/51 (!) 113/58 (!) 126/56 (!) 159/67  Pulse: 70 68 76 88  Resp: 16 16    Temp: 97.7 F (36.5 C) (!) 97.4 F (36.3 C) 98 F (36.7 C) 98.1 F (36.7 C)  TempSrc: Oral Oral Oral Oral  SpO2: 100% 98% 99% 95%  Weight:    65.9 kg  Height:        Intake/Output Summary (Last 24 hours) at 01/08/2020 0806 Last data filed at 01/08/2020 0538 Gross per 24 hour  Intake 820 ml  Output 1400 ml  Net -580 ml   Filed Weights   01/06/20 0525 01/07/20 0422 01/08/20 0408  Weight: 65.1 kg 66.5 kg 65.9 kg    Examination:  General exam: Appears calm and comfortable  Respiratory system: Course breath sounds b/l Cardiovascular system: S1 & S2+. No  rubs, gallops or clicks.  Gastrointestinal system: Abdomen is nondistended, soft and nontender. normal bowel sounds heard. Central nervous system: Alert and oriented. Moves all 4 extremities Psychiatry: Judgement and insight appear normal. Mood & affect appropriate.     Data Reviewed: I have personally reviewed following labs and imaging studies  CBC: Recent Labs  Lab 01/02/20 0502 01/05/20 0531 01/07/20 0548  WBC 7.0 10.8* 9.0  HGB 7.8* 8.7* 8.8*  HCT 26.1* 28.3* 29.5*  MCV 71.7* 71.3* 75.3*  PLT 213 289 063   Basic Metabolic Panel: Recent Labs  Lab 01/02/20 0502 01/03/20 0452 01/04/20 0528 01/07/20 0548  NA  --   --  137 135  K 4.4  4.9 4.5 4.6  CL  --   --  100 100  CO2  --   --  29 29  GLUCOSE  --   --  108* 114*  BUN  --   --  19 21  CREATININE  --   --  0.89 0.91  CALCIUM  --   --  8.8* 8.8*  MG 1.9  --   --   --    GFR: Estimated Creatinine Clearance: 46.1 mL/min (by C-G formula based on SCr of 0.91 mg/dL). Liver Function Tests: No results for input(s): AST, ALT, ALKPHOS, BILITOT, PROT, ALBUMIN in the last 168 hours. No  results for input(s): LIPASE, AMYLASE in the last 168 hours. No results for input(s): AMMONIA in the last 168 hours. Coagulation Profile: No results for input(s): INR, PROTIME in the last 168 hours. Cardiac Enzymes: No results for input(s): CKTOTAL, CKMB, CKMBINDEX, TROPONINI in the last 168 hours. BNP (last 3 results) No results for input(s): PROBNP in the last 8760 hours. HbA1C: No results for input(s): HGBA1C in the last 72 hours. CBG: No results for input(s): GLUCAP in the last 168 hours. Lipid Profile: No results for input(s): CHOL, HDL, LDLCALC, TRIG, CHOLHDL, LDLDIRECT in the last 72 hours. Thyroid Function Tests: No results for input(s): TSH, T4TOTAL, FREET4, T3FREE, THYROIDAB in the last 72 hours. Anemia Panel: No results for input(s): VITAMINB12, FOLATE, FERRITIN, TIBC, IRON, RETICCTPCT in the last 72 hours. Sepsis Labs: No results for input(s): PROCALCITON, LATICACIDVEN in the last 168 hours.  Recent Results (from the past 240 hour(s))  C Difficile Quick Screen w PCR reflex     Status: None   Collection Time: 12/31/19 12:29 AM   Specimen: Stool  Result Value Ref Range Status   C Diff antigen NEGATIVE NEGATIVE Final   C Diff toxin NEGATIVE NEGATIVE Final   C Diff interpretation No C. difficile detected.  Final    Comment: Performed at [facility], [address] [place], [place] [address]  GI pathogen panel by PCR, stool     Status: None   Collection Time: 12/31/19 12:29 AM   Specimen: Stool  Result Value Ref Range Status   Plesiomonas shigelloides NOT DETECTED NOT DETECTED Final   Yersinia enterocolitica NOT DETECTED NOT DETECTED Final   Vibrio NOT DETECTED NOT DETECTED Final   Enteropathogenic E coli NOT DETECTED NOT DETECTED Final   E coli (ETEC) LT/ST NOT DETECTED NOT DETECTED Final   E coli 3491 by PCR Not applicable NOT DETECTED Final   Cryptosporidium by PCR NOT DETECTED NOT DETECTED Final   Entamoeba histolytica NOT DETECTED NOT DETECTED Final    Adenovirus F 40/41 NOT DETECTED NOT DETECTED Final   Norovirus GI/GII NOT DETECTED NOT DETECTED Final   Sapovirus NOT DETECTED NOT DETECTED Final    Comment: (NOTE) Performed At: [facility] [place], [place] [id number] Rush Farmer MD XY:[id number]    Vibrio cholerae NOT DETECTED NOT DETECTED Final   Campylobacter by PCR NOT DETECTED NOT DETECTED Final   Salmonella by PCR NOT DETECTED NOT DETECTED Final   E coli (STEC) NOT DETECTED NOT DETECTED Final   Enteroaggregative E coli NOT DETECTED NOT DETECTED Final   Shigella by PCR NOT DETECTED NOT DETECTED Final   Cyclospora cayetanensis NOT DETECTED NOT DETECTED Final   Astrovirus NOT DETECTED NOT DETECTED Final   G lamblia by PCR NOT DETECTED NOT DETECTED Final   Rotavirus A by PCR NOT DETECTED NOT DETECTED Final  Respiratory Panel  by RT PCR (Flu A&B, Covid) - Nasopharyngeal Swab     Status: None   Collection Time: 12/31/19 12:29 AM   Specimen: Nasopharyngeal Swab  Result Value Ref Range Status   SARS Coronavirus 2 by RT PCR NEGATIVE NEGATIVE Final    Comment: (NOTE) SARS-CoV-2 target nucleic acids are NOT DETECTED. The SARS-CoV-2 RNA is generally detectable in upper respiratoy specimens during the acute phase of infection. The lowest concentration of SARS-CoV-2 viral copies this assay can detect is 131 copies/mL. A negative result does not preclude SARS-Cov-2 infection and should not be used as the sole basis for treatment or other patient management decisions. A negative result may occur with  improper specimen collection/handling, submission of specimen other than nasopharyngeal swab, presence of viral mutation(s) within the areas targeted by this assay, and inadequate number of viral copies (<131 copies/mL). A negative result must be combined with clinical observations, patient history, and epidemiological information. The expected result is Negative. Fact Sheet for Patients:    PinkCheek.be Fact Sheet for Healthcare Providers:  GravelBags.it This test is not yet ap proved or cleared by the [place] FDA and  has been authorized for detection and/or diagnosis of SARS-CoV-2 by FDA under an Emergency Use Authorization (EUA). This EUA will remain  in effect (meaning this test can be used) for the duration of the COVID-19 declaration under Section 564(b)(1) of the Act, 21 U.S.C. section 360bbb-3(b)(1), unless the authorization is terminated or revoked sooner.    Influenza A by PCR NEGATIVE NEGATIVE Final   Influenza B by PCR NEGATIVE NEGATIVE Final    Comment: (NOTE) The Xpert Xpress SARS-CoV-2/FLU/RSV assay is intended as an aid in  the diagnosis of influenza from Nasopharyngeal swab specimens and  should not be used as a sole basis for treatment. Nasal washings and  aspirates are unacceptable for Xpert Xpress SARS-CoV-2/FLU/RSV  testing. Fact Sheet for Patients: PinkCheek.be Fact Sheet for Healthcare Providers: GravelBags.it This test is not yet approved or cleared by the [place] FDA and  has been authorized for detection and/or diagnosis of SARS-CoV-2 by  FDA under an Emergency Use Authorization (EUA). This EUA will remain  in effect (meaning this test can be used) for the duration of the  Covid-19 declaration under Section 564(b)(1) of the Act, 21  U.S.C. section 360bbb-3(b)(1), unless the authorization is  terminated or revoked. Performed at [facility], [address], [place], [place] [address]          Radiology Studies: [facility] Chest [place] 1 View  Result Date: 01/06/2020 CLINICAL DATA:  Hypoxia. EXAM: PORTABLE CHEST 1 VIEW COMPARISON:  12/29/2019 FINDINGS: Heart size is normal. Chronic fibrotic lung disease peers similar to previous studies. No evidence of acute consolidation, collapse or effusion. Low level  inflammatory changes could be hidden within the extensive chronic findings. No bone abnormality. IMPRESSION: Chronic fibrotic lung disease without visible change. Lower level inflammatory changes could be hidden within the extensive chronic findings. Electronically Signed   By: Nelson Chimes M.D.   On: 01/06/2020 12:23        Scheduled Meds: . apixaban  5 mg Oral BID  . busPIRone  7.5 mg Oral BID  . calcium carbonate  1 tablet Oral BID  . diltiazem  240 mg Oral Daily  . dronedarone  400 mg Oral BID WC  . famotidine  20 mg Oral Daily  . ferrous sulfate  325 mg Oral Q breakfast  . FLUoxetine  40 mg Oral Daily  . furosemide  20 mg Oral Daily  . levothyroxine  75 mcg Oral QAC breakfast  . off the beat book   Does not apply Once  . potassium chloride  20 mEq Oral BID  . predniSONE  10 mg Oral Q breakfast  . sodium chloride flush  3 mL Intravenous Q12H  . tadalafil (PAH)  40 mg Oral QHS  . traZODone  50 mg Oral QHS   Continuous Infusions: . sodium chloride 500 mL (01/06/20 2207)     LOS: 8 days    Time spent: 33 mins    Wyvonnia Dusky, MD [facility] Pager [phone]  If 7PM-7AM, please contact night-coverage [email or web address] Password [email or web address] 01/08/2020, 8:06 AM

## 2020-01-09 DIAGNOSIS — I480 Paroxysmal atrial fibrillation: Secondary | ICD-10-CM | POA: Diagnosis not present

## 2020-01-09 DIAGNOSIS — E119 Type 2 diabetes mellitus without complications: Secondary | ICD-10-CM | POA: Diagnosis not present

## 2020-01-09 DIAGNOSIS — J8410 Pulmonary fibrosis, unspecified: Secondary | ICD-10-CM | POA: Diagnosis not present

## 2020-01-09 LAB — CBC
HCT: 30.1 % — ABNORMAL LOW (ref 36.0–46.0)
Hemoglobin: 9.2 g/dL — ABNORMAL LOW (ref 12.0–15.0)
MCH: 23.5 pg — ABNORMAL LOW (ref 26.0–34.0)
MCHC: 30.6 g/dL (ref 30.0–36.0)
MCV: 76.8 fL — ABNORMAL LOW (ref 80.0–100.0)
Platelets: 268 10*3/uL (ref 150–400)
RBC: 3.92 MIL/uL (ref 3.87–5.11)
RDW: 24.8 % — ABNORMAL HIGH (ref 11.5–15.5)
WBC: 8.3 10*3/uL (ref 4.0–10.5)
nRBC: 0 % (ref 0.0–0.2)

## 2020-01-09 LAB — BASIC METABOLIC PANEL
Anion gap: 9 (ref 5–15)
BUN: 20 mg/dL (ref 8–23)
CO2: 29 mmol/L (ref 22–32)
Calcium: 8.9 mg/dL (ref 8.9–10.3)
Chloride: 100 mmol/L (ref 98–111)
Creatinine, Ser: 1.09 mg/dL — ABNORMAL HIGH (ref 0.44–1.00)
GFR calc Af Amer: 56 mL/min — ABNORMAL LOW (ref >60)
GFR calc non Af Amer: 48 mL/min — ABNORMAL LOW (ref >60)
Glucose, Bld: 101 mg/dL — ABNORMAL HIGH (ref 70–99)
Potassium: 4.3 mmol/L (ref 3.5–5.1)
Sodium: 138 mmol/L (ref 135–145)

## 2020-01-09 MED ORDER — METOPROLOL TARTRATE 25 MG PO TABS
25.0000 mg | ORAL_TABLET | Freq: Two times a day (BID) | ORAL | Status: DC
  Administered 2020-01-10 – 2020-01-15 (×12): 25 mg via ORAL
  Filled 2020-01-09 (×12): qty 1

## 2020-01-09 NOTE — Progress Notes (Signed)
PROGRESS NOTE    Julie Mcmillan  RXJ:[id number] DOB: [birth year] DOA: 12/30/2019 PCP: Kirk Ruths, MD       Assessment & Plan:   Principal Problem:   Gastroenteritis Active Problems:   Type 2 diabetes mellitus ([place])   Anemia, iron deficiency   Adult hypothyroidism   Hypertension associated with diabetes ([place])   PAF (paroxysmal atrial fibrillation) ([facility])   Interstitial lung disease ([facility])   Paroxysmal atrial fibrillation: with intermittent RVR. Continue on cardizem. IV cardizem prn for RVR. Put on IV cardizem drip 01/06/20 evening for RVR but pt converted to sinus rhythm so this was d/c. With nonsustained VT w/ movement. Cardio was notified and [facility]. Cardiology spoke to [place], will continue on Multaq 400 mg twice daily. If fails on Multaq, will need ablation.   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 Continue prednisone 10 mg daily. Holding azathioprine for now as above, can resume on discharge. Continue supplemental oxygen, maintaining well on home 4 L [place]  Gastroenteritis: Resolved. CT abdomen/pelvis with contrast on 12/29/2019 without acute etiology.  C. difficile, GI pathogen panel, SARS-CoV-2 PCR-so far negative. Continue to hold azathioprine, colestipol, bactrim for now.  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 Transfused iv iron x2 so far. Will need to follow-up with PCP for outpatient work-up. Pt verbalized their understanding   Pulmonary hypertension: continue tadalafil  Hypertension:continue diltiazem   Hypothyroidism: continue synthroid.  DM2: diet controlled. Will continue to monitor   Depression/anxiety: severity unknown. Continue fluoxetine and BuSpar.   DVT prophylaxis: eliquis Code Status: full  Family Communication:  Disposition Plan: will likely d/c home w/ home health; pt goes into a.fib and VT w/ exertion, cardio notified and awaiting recs, so  will hold off on d/c today   Consultants:   cardio   Procedures:    Antimicrobials:    Subjective: Pt c/o malaise.   Objective: Vitals:   01/08/20 2200 01/08/20 2300 01/09/20 0459 01/09/20 0741  BP:   126/62 (!) 127/57  Pulse: 77 72 72 79  Resp: _0 Temp:   98.7 F (37.1 C) 98.3 F (36.8 C)  TempSrc:   Oral   SpO2: 99% 99% 99% 100%  Weight:   64.9 kg   Height:        Intake/Output Summary (Last 24 hours) at 01/09/2020 0755 Last data filed at 01/09/2020 0459 Gross per 24 hour  Intake 62.96 ml  Output 0 ml  Net 62.96 ml   Filed Weights   01/07/20 0422 01/08/20 0408 01/09/20 0459  Weight: 66.5 kg 65.9 kg 64.9 kg    Examination:  General exam: Appears calm and comfortable  Respiratory system: Course breath sounds b/l Cardiovascular system: irregularly irregular. No  rubs, gallops or clicks.  Gastrointestinal system: Abdomen is nondistended, soft and nontender. normal bowel sounds heard. Central nervous system: Alert and oriented. Moves all 4 extremities Psychiatry: Judgement and insight appear normal. Mood & affect appropriate.     Data Reviewed: I have personally reviewed following labs and imaging studies  CBC: Recent Labs  Lab 01/05/20 0531 01/07/20 0548 01/08/20 0644 01/09/20 0628  WBC 10.8* 9.0 8.9 8.3  HGB 8.7* 8.8* 8.9* 9.2*  HCT 28.3* 29.5* 29.5* 30.1*  MCV 71.3* 75.3* 75.3* 76.8*  PLT 289 276 276 997   Basic Metabolic Panel: Recent Labs  Lab 01/03/20 0452 01/04/20 0528 01/07/20 0548 01/08/20 0644 01/09/20 0628  NA  --  137 135 135 138  K 4.9 4.5 4.6 4.4 4.3  CL  --  100 100 99 100  CO2  --  _0 GLUCOSE  --  108* 114* 98 101*  BUN  --  _1 CREATININE  --  0.89 0.91 0.99 1.09*  CALCIUM  --  8.8* 8.8* 8.7* 8.9   GFR: Estimated Creatinine Clearance: 35.5 mL/min (A) (by C-G formula based on SCr of 1.09 mg/dL (H)). Liver Function Tests: No results for input(s): AST, ALT, ALKPHOS, BILITOT, PROT, ALBUMIN in  the last 168 hours. No results for input(s): LIPASE, AMYLASE in the last 168 hours. No results for input(s): AMMONIA in the last 168 hours. Coagulation Profile: No results for input(s): INR, PROTIME in the last 168 hours. Cardiac Enzymes: No results for input(s): CKTOTAL, CKMB, CKMBINDEX, TROPONINI in the last 168 hours. BNP (last 3 results) No results for input(s): PROBNP in the last 8760 hours. HbA1C: No results for input(s): HGBA1C in the last 72 hours. CBG: No results for input(s): GLUCAP in the last 168 hours. Lipid Profile: No results for input(s): CHOL, HDL, LDLCALC, TRIG, CHOLHDL, LDLDIRECT in the last 72 hours. Thyroid Function Tests: No results for input(s): TSH, T4TOTAL, FREET4, T3FREE, THYROIDAB in the last 72 hours. Anemia Panel: No results for input(s): VITAMINB12, FOLATE, FERRITIN, TIBC, IRON, RETICCTPCT in the last 72 hours. Sepsis Labs: No results for input(s): PROCALCITON, LATICACIDVEN in the last 168 hours.  Recent Results (from the past 240 hour(s))  C Difficile Quick Screen w PCR reflex     Status: None   Collection Time: 12/31/19 12:29 AM   Specimen: Stool  Result Value Ref Range Status   C Diff antigen NEGATIVE NEGATIVE Final   C Diff toxin NEGATIVE NEGATIVE Final   C Diff interpretation No C. difficile detected.  Final    Comment: Performed at [facility], [address] [place], [place] [address]  GI pathogen panel by PCR, stool     Status: None   Collection Time: 12/31/19 12:29 AM   Specimen: Stool  Result Value Ref Range Status   Plesiomonas shigelloides NOT DETECTED NOT DETECTED Final   Yersinia enterocolitica NOT DETECTED NOT DETECTED Final   Vibrio NOT DETECTED NOT DETECTED Final   Enteropathogenic E coli NOT DETECTED NOT DETECTED Final   E coli (ETEC) LT/ST NOT DETECTED NOT DETECTED Final   E coli 1415 by PCR Not applicable NOT DETECTED Final   Cryptosporidium by PCR NOT DETECTED NOT DETECTED Final   Entamoeba histolytica NOT  DETECTED NOT DETECTED Final   Adenovirus F 40/41 NOT DETECTED NOT DETECTED Final   Norovirus GI/GII NOT DETECTED NOT DETECTED Final   Sapovirus NOT DETECTED NOT DETECTED Final    Comment: (NOTE) Performed At: [facility] [place], [place] [id number] Rush Farmer MD LV:[id number]    Vibrio cholerae NOT DETECTED NOT DETECTED Final   Campylobacter by PCR NOT DETECTED NOT DETECTED Final   Salmonella by PCR NOT DETECTED NOT DETECTED Final   E coli (STEC) NOT DETECTED NOT DETECTED Final   Enteroaggregative E coli NOT DETECTED NOT DETECTED Final   Shigella by PCR NOT DETECTED NOT DETECTED Final   Cyclospora cayetanensis NOT DETECTED NOT DETECTED Final   Astrovirus NOT DETECTED NOT DETECTED Final   G lamblia by PCR NOT DETECTED NOT DETECTED Final   Rotavirus A by PCR NOT DETECTED NOT DETECTED Final  Respiratory Panel by RT PCR (Flu A&B, Covid) - Nasopharyngeal Swab  Status: None   Collection Time: 12/31/19 12:29 AM   Specimen: Nasopharyngeal Swab  Result Value Ref Range Status   SARS Coronavirus 2 by RT PCR NEGATIVE NEGATIVE Final    Comment: (NOTE) SARS-CoV-2 target nucleic acids are NOT DETECTED. The SARS-CoV-2 RNA is generally detectable in upper respiratoy specimens during the acute phase of infection. The lowest concentration of SARS-CoV-2 viral copies this assay can detect is 131 copies/mL. A negative result does not preclude SARS-Cov-2 infection and should not be used as the sole basis for treatment or other patient management decisions. A negative result may occur with  improper specimen collection/handling, submission of specimen other than nasopharyngeal swab, presence of viral mutation(s) within the areas targeted by this assay, and inadequate number of viral copies (<131 copies/mL). A negative result must be combined with clinical observations, patient history, and epidemiological information. The expected result is Negative. Fact Sheet for  Patients:  PinkCheek.be Fact Sheet for Healthcare Providers:  GravelBags.it This test is not yet ap proved or cleared by the [place] FDA and  has been authorized for detection and/or diagnosis of SARS-CoV-2 by FDA under an Emergency Use Authorization (EUA). This EUA will remain  in effect (meaning this test can be used) for the duration of the COVID-19 declaration under Section 564(b)(1) of the Act, 21 U.S.C. section 360bbb-3(b)(1), unless the authorization is terminated or revoked sooner.    Influenza A by PCR NEGATIVE NEGATIVE Final   Influenza B by PCR NEGATIVE NEGATIVE Final    Comment: (NOTE) The Xpert Xpress SARS-CoV-2/FLU/RSV assay is intended as an aid in  the diagnosis of influenza from Nasopharyngeal swab specimens and  should not be used as a sole basis for treatment. Nasal washings and  aspirates are unacceptable for Xpert Xpress SARS-CoV-2/FLU/RSV  testing. Fact Sheet for Patients: PinkCheek.be Fact Sheet for Healthcare Providers: GravelBags.it This test is not yet approved or cleared by the [place] FDA and  has been authorized for detection and/or diagnosis of SARS-CoV-2 by  FDA under an Emergency Use Authorization (EUA). This EUA will remain  in effect (meaning this test can be used) for the duration of the  Covid-19 declaration under Section 564(b)(1) of the Act, 21  U.S.C. section 360bbb-3(b)(1), unless the authorization is  terminated or revoked. Performed at [facility], [address] [place], [place] [address]          Radiology Studies: No results found.      Scheduled Meds: . apixaban  5 mg Oral BID  . busPIRone  7.5 mg Oral BID  . calcium carbonate  1 tablet Oral BID  . diltiazem  240 mg Oral Daily  . dronedarone  400 mg Oral BID WC  . famotidine  20 mg Oral Daily  . ferrous sulfate  325 mg Oral Q  breakfast  . FLUoxetine  40 mg Oral Daily  . furosemide  20 mg Oral Daily  . levothyroxine  75 mcg Oral QAC breakfast  . off the beat book   Does not apply Once  . potassium chloride  20 mEq Oral BID  . predniSONE  10 mg Oral Q breakfast  . sodium chloride flush  3 mL Intravenous Q12H  . tadalafil (PAH)  40 mg Oral QHS  . traZODone  50 mg Oral QHS   Continuous Infusions: . sodium chloride 500 mL/hr at 01/07/20 0351     LOS: 9 days    Time spent: 35 mins    Wyvonnia Dusky, MD [facility]  Pager [phone]  If 7PM-7AM, please contact night-coverage [email or web address] 01/09/2020, 7:55 AM

## 2020-01-09 NOTE — Progress Notes (Signed)
[facility] Encounter Note  Patient: Julie Mcmillan / Admit Date: 12/30/2019 / Date of Encounter: 01/09/2020, 2:54 PM   Subjective: Patient has had episodes of paroxysmal nonvalvular atrial fibrillation for which she has had more more episodes of irregularity of her heartbeat.  This has been previously treated with sotalol but now has been changed to Multaq.  Additional diltiazem has been used but now has had another multiple episodes of atrial flutter with rapid ventricular rate.  Multaq sotalol do not typically work well with conversion of atrial flutter and therefore cardioversion either spontaneously and/or electrically works better or if patient may need future treatment with ablation. Also would consider the possibility addition of metoprolol for better heart rate control of atrial flutter if necessary  Review of Systems: Positive for: Palpitations Negative for: Vision change, hearing change, syncope, dizziness, nausea, vomiting,diarrhea, bloody stool, stomach pain, cough, congestion, diaphoresis, urinary frequency, urinary pain,skin lesions, skin rashes Others previously listed  Objective: Telemetry: Atrial flutter with rapid rate Physical Exam: Blood pressure (!) 131/46, pulse 89, temperature 98.2 F (36.8 C), temperature source Oral, resp. rate 15, height _0  (1.626 m), weight 64.9 kg, SpO2 99 %. Body mass index is 24.55 kg/m. General: Well developed, well nourished, in no acute distress. Head: Normocephalic, atraumatic, sclera non-icteric, no xanthomas, nares are without discharge. Neck: No apparent masses Lungs: Normal respirations with no wheezes, no rhonchi, no rales , no crackles   Heart: Regular rate and rhythm, normal S1 S2, no murmur, no rub, no gallop, PMI is normal size and placement, carotid upstroke normal without bruit, jugular venous pressure normal Abdomen: Soft, non-tender, non-distended with normoactive bowel sounds. No hepatosplenomegaly.  Abdominal aorta is normal size without bruit Extremities: No edema, no clubbing, no cyanosis, no ulcers,  Peripheral: 2+ radial, 2+ femoral, 2+ dorsal pedal pulses Neuro: Alert and oriented. Moves all extremities spontaneously. Psych:  Responds to questions appropriately with a normal affect.   Intake/Output Summary (Last 24 hours) at 01/09/2020 1454 Last data filed at 01/09/2020 1345 Gross per 24 hour  Intake 302.96 ml  Output 0 ml  Net 302.96 ml    Inpatient Medications:  . apixaban  5 mg Oral BID  . busPIRone  7.5 mg Oral BID  . calcium carbonate  1 tablet Oral BID  . diltiazem  240 mg Oral Daily  . dronedarone  400 mg Oral BID WC  . famotidine  20 mg Oral Daily  . ferrous sulfate  325 mg Oral Q breakfast  . FLUoxetine  40 mg Oral Daily  . furosemide  20 mg Oral Daily  . levothyroxine  75 mcg Oral QAC breakfast  . off the beat book   Does not apply Once  . potassium chloride  20 mEq Oral BID  . predniSONE  10 mg Oral Q breakfast  . sodium chloride flush  3 mL Intravenous Q12H  . tadalafil (PAH)  40 mg Oral QHS  . traZODone  50 mg Oral QHS   Infusions:  . sodium chloride 500 mL/hr at 01/07/20 0351    Labs: Recent Labs    01/08/20 0644 01/09/20 0628  NA 135 138  K 4.4 4.3  CL 99 100  CO2 27 29  GLUCOSE 98 101*  BUN 19 20  CREATININE 0.99 1.09*  CALCIUM 8.7* 8.9   No results for input(s): AST, ALT, ALKPHOS, BILITOT, PROT, ALBUMIN in the last 72 hours. Recent Labs    01/08/20 0644 01/09/20 0628  WBC 8.9 8.3  HGB  8.9* 9.2*  HCT 29.5* 30.1*  MCV 75.3* 76.8*  PLT 276 268   No results for input(s): CKTOTAL, CKMB, TROPONINI in the last 72 hours. Invalid input(s): POCBNP No results for input(s): HGBA1C in the last 72 hours.   Weights: Filed Weights   01/07/20 0422 01/08/20 0408 01/09/20 0459  Weight: 66.5 kg 65.9 kg 64.9 kg     Radiology/Studies:  CT ABDOMEN PELVIS W CONTRAST  Result Date: 12/29/2019 CLINICAL DATA:  Nausea, vomiting and diarrhea  since last night, intermittent abdominal pain rated 4/10, history of type II diabetes mellitus, atrial fibrillation, diverticulitis, GERD, hypertension, irritable bowel syndrome, pulmonary hypertension, pulmonary fibrosis EXAM: CT ABDOMEN AND PELVIS WITH CONTRAST TECHNIQUE: Multidetector CT imaging of the abdomen and pelvis was performed using the standard protocol following bolus administration of intravenous contrast. Sagittal and coronal MPR images reconstructed from axial data set. CONTRAST:  144m OMNIPAQUE IOHEXOL 300 MG/ML SOLN IV. No oral contrast. COMPARISON:  09/04/2019 FINDINGS: Lower chest: Bibasilar pulmonary fibrosis unchanged. Hepatobiliary: Contracted gallbladder. Liver unremarkable Pancreas: Hypoechoic nodule at body of pancreas, 8 x 12 mm, grossly unchanged. No additional pancreatic abnormalities. Spleen: Several tiny nonspecific low-attenuation foci within spleen, nonspecific. Adrenals/Urinary Tract: Adrenal glands normal appearance. Cortical scarring and small cysts within both kidneys. No enhancing renal mass, hydronephrosis or urinary tract calcification. Bladder and ureters unremarkable. Stomach/Bowel: Low lying cecum in pelvis. Appendix surgically absent by history. Distal colonic diverticulosis without evidence of diverticulitis. Moderate-sized hiatal hernia. Vascular/Lymphatic: Scattered atherosclerotic calcifications aorta and iliac arteries without aneurysm. Additional calcified plaques at the origins of the celiac and superior mesenteric arteries. No adenopathy. Reproductive: Uterus surgically absent with nonvisualization of ovaries Other: No free air or free fluid. No acute inflammatory process or hernia identified. Musculoskeletal: Osseous demineralization marked compression fracture of T8 vertebral body unchanged since another CT of 10/26/2016. IMPRESSION: Distal colonic diverticulosis without evidence of diverticulitis. Moderate-sized hiatal hernia. Stable cystic lesion at body of  pancreas 8 x 12 x 14 mm. Stable bibasilar pulmonary fibrosis. Stable marked compression fracture T8 vertebral body. No acute intra-abdominal or intrapelvic abnormalities. Aortic Atherosclerosis (ICD10-I70.0). Electronically Signed   By: MLavonia DanaM.D.   On: 12/29/2019 13:09   DG Chest Port 1 View  Result Date: 01/06/2020 CLINICAL DATA:  Hypoxia. EXAM: PORTABLE CHEST 1 VIEW COMPARISON:  12/29/2019 FINDINGS: Heart size is normal. Chronic fibrotic lung disease peers similar to previous studies. No evidence of acute consolidation, collapse or effusion. Low level inflammatory changes could be hidden within the extensive chronic findings. No bone abnormality. IMPRESSION: Chronic fibrotic lung disease without visible change. Lower level inflammatory changes could be hidden within the extensive chronic findings. Electronically Signed   By: MNelson ChimesM.D.   On: 01/06/2020 12:23   DG Chest Portable 1 View  Result Date: 12/29/2019 CLINICAL DATA:  Pain with nausea and vomiting EXAM: PORTABLE CHEST 1 VIEW COMPARISON:  August 10, 2019 FINDINGS: There is extensive fibrosis throughout the lungs bilaterally. There is no frank edema or consolidation. Heart is upper normal in size with pulmonary vascularity normal. No adenopathy. No bone lesions. There is aortic atherosclerosis. IMPRESSION: Extensive fibrosis throughout the lungs, stable. No edema or airspace opacity. Stable cardiac silhouette. No adenopathy. Aortic Atherosclerosis (ICD10-I70.0). Electronically Signed   By: WLowella GripIII M.D.   On: 12/29/2019 13:31   ECHOCARDIOGRAM COMPLETE  Result Date: 01/03/2020    ECHOCARDIOGRAM REPORT   Patient Name:   PANAALICIA REIMANNDate of Exam: 01/02/2020 Medical Rec #:  [id number]  Height:       64.0 in Accession #:    [id number]       Weight:       147.0 lb Date of Birth:  08-23-1941         BSA:          1.716 m Patient Age:    73 years         BP:           144/61 mmHg Patient Gender: F                HR:            66 bpm. Exam Location:  [facility] Procedure: 2D Echo Indications:     Abnormal ECG 794.31/ R94.31  History:         Patient has prior history of Echocardiogram examinations, most                  recent 03/26/2015.  Sonographer:     Arville Go RDCS Referring Phys:  [id number] [place] Diagnosing Phys: Bartholome Bill MD IMPRESSIONS  1. Left ventricular ejection fraction, by estimation, is 60 to 65%. The left ventricle has normal function. The left ventricle has no regional wall motion abnormalities. There is mild asymmetric left ventricular hypertrophy of the basal-septal segment. Left ventricular diastolic parameters are consistent with Grade I diastolic dysfunction (impaired relaxation).  2. Right ventricular systolic function is normal. The right ventricular size is normal.  3. Left atrial size was mildly dilated.  4. Right atrial size was mildly dilated.  5. The mitral valve is grossly normal. Trivial mitral valve regurgitation.  6. The aortic valve is tricuspid. Aortic valve regurgitation is mild. No aortic stenosis is present. FINDINGS  Left Ventricle: Left ventricular ejection fraction, by estimation, is 60 to 65%. The left ventricle has normal function. The left ventricle has no regional wall motion abnormalities. The left ventricular internal cavity size was normal in size. There is  mild asymmetric left ventricular hypertrophy of the basal-septal segment. Left ventricular diastolic parameters are consistent with Grade I diastolic dysfunction (impaired relaxation). Right Ventricle: The right ventricular size is normal. No increase in right ventricular wall thickness. Right ventricular systolic function is normal. Left Atrium: Left atrial size was mildly dilated. Right Atrium: Right atrial size was mildly dilated. Pericardium: There is no evidence of pericardial effusion. Mitral Valve: The mitral valve is grossly normal. Trivial mitral valve regurgitation. Tricuspid Valve: The tricuspid valve is normal in  structure. Tricuspid valve regurgitation is mild. Aortic Valve: The aortic valve is tricuspid. Aortic valve regurgitation is mild. Aortic regurgitation PHT measures 639 msec. No aortic stenosis is present. Aortic valve peak gradient measures 11.2 mmHg. Pulmonic Valve: The pulmonic valve was grossly normal. Pulmonic valve regurgitation is trivial. Aorta: The aortic root is normal in size and structure. IAS/Shunts: The interatrial septum was not assessed.  LEFT VENTRICLE PLAX 2D LVIDd:         4.31 cm  Diastology LVIDs:         2.89 cm  LV e' lateral:   4.68 cm/s LV PW:         1.32 cm  LV E/e' lateral: 19.5 LV IVS:        1.14 cm  LV e' medial:    4.46 cm/s LVOT diam:     1.90 cm  LV E/e' medial:  20.4 LV SV:         67 LV SV Index:   39  LVOT Area:     2.84 cm  RIGHT VENTRICLE RV Basal diam:  3.23 cm RV S prime:     16.60 cm/s TAPSE (M-mode): 2.2 cm LEFT ATRIUM             Index       RIGHT ATRIUM           Index LA diam:        3.90 cm 2.27 cm/m  RA Area:     12.80 cm LA Vol (A2C):   25.2 ml 14.68 ml/m RA Volume:   29.60 ml  17.25 ml/m LA Vol (A4C):   37.0 ml 21.56 ml/m LA Biplane Vol: 32.7 ml 19.05 ml/m  AORTIC VALVE                PULMONIC VALVE AV Area (Vmax): 1.90 cm    PV Vmax:       1.17 m/s AV Vmax:        167.00 cm/s PV Peak grad:  5.5 mmHg AV Peak Grad:   11.2 mmHg LVOT Vmax:      112.00 cm/s LVOT Vmean:     64.500 cm/s LVOT VTI:       0.235 m AI PHT:         639 msec  AORTA Ao Root diam: 2.90 cm Ao Asc diam:  2.70 cm MITRAL VALVE                TRICUSPID VALVE MV Area (PHT): 3.42 cm     TV Peak grad:   55.2 mmHg MV Decel Time: 222 msec     TV Vmax:        3.72 m/s MV E velocity: 91.20 cm/s MV A velocity: 127.00 cm/s  SHUNTS MV E/A ratio:  0.72         Systemic VTI:  0.24 m                             Systemic Diam: 1.90 cm Bartholome Bill MD Electronically signed by Bartholome Bill MD Signature Date/Time: 01/03/2020/9:14:55 AM    Final      Assessment and Recommendation  79 y.o. female with known  paroxysmal nonvalvular atrial fibrillation very well controlled at this time on appropriate medication management although now with new atrial flutter with rapid rate 1.  Continue anticoagulation for further risk reduction in stroke with atrial fibrillation and/or atrial flutter 2.  Continue Multaq diltiazem combination for atrial fibrillation combination treatment 3.  Add low-dose beta-blocker for heart rate control of atrial flutter with hopeful spontaneous conversion to normal rhythm 4.  May require ablation of atrial flutter if not well controlled  Signed, Serafina Royals M.D. FACC

## 2020-01-10 LAB — BASIC METABOLIC PANEL
Anion gap: 10 (ref 5–15)
BUN: 19 mg/dL (ref 8–23)
CO2: 29 mmol/L (ref 22–32)
Calcium: 8.8 mg/dL — ABNORMAL LOW (ref 8.9–10.3)
Chloride: 98 mmol/L (ref 98–111)
Creatinine, Ser: 1.05 mg/dL — ABNORMAL HIGH (ref 0.44–1.00)
GFR calc Af Amer: 58 mL/min — ABNORMAL LOW (ref >60)
GFR calc non Af Amer: 50 mL/min — ABNORMAL LOW (ref >60)
Glucose, Bld: 102 mg/dL — ABNORMAL HIGH (ref 70–99)
Potassium: 4.1 mmol/L (ref 3.5–5.1)
Sodium: 137 mmol/L (ref 135–145)

## 2020-01-10 LAB — CBC
HCT: 31.1 % — ABNORMAL LOW (ref 36.0–46.0)
Hemoglobin: 9.7 g/dL — ABNORMAL LOW (ref 12.0–15.0)
MCH: 23.9 pg — ABNORMAL LOW (ref 26.0–34.0)
MCHC: 31.2 g/dL (ref 30.0–36.0)
MCV: 76.6 fL — ABNORMAL LOW (ref 80.0–100.0)
Platelets: 249 10*3/uL (ref 150–400)
RBC: 4.06 MIL/uL (ref 3.87–5.11)
RDW: 26.3 % — ABNORMAL HIGH (ref 11.5–15.5)
WBC: 8.7 10*3/uL (ref 4.0–10.5)
nRBC: 0 % (ref 0.0–0.2)

## 2020-01-10 MED ORDER — METOPROLOL TARTRATE 25 MG PO TABS: 25.0000 mg | ORAL_TABLET | Freq: Two times a day (BID) | ORAL | 0 refills | Status: DC

## 2020-01-10 MED ORDER — POTASSIUM CHLORIDE CRYS ER 20 MEQ PO TBCR: 20.0000 meq | EXTENDED_RELEASE_TABLET | Freq: Every day | ORAL | 0 refills | Status: AC

## 2020-01-10 MED ORDER — DILTIAZEM HCL ER COATED BEADS 240 MG PO CP24: 240.0000 mg | ORAL_CAPSULE | Freq: Every day | ORAL | 0 refills | Status: DC

## 2020-01-10 MED ORDER — DRONEDARONE HCL 400 MG PO TABS: 400.0000 mg | ORAL_TABLET | Freq: Two times a day (BID) | ORAL | 0 refills | Status: AC

## 2020-01-10 MED ORDER — FERROUS SULFATE 325 (65 FE) MG PO TABS: 325.0000 mg | ORAL_TABLET | Freq: Every day | ORAL | 0 refills | Status: AC

## 2020-01-10 NOTE — Social Work (Signed)
TOC CM/SW received notice that patient, Mrs. Julie Mcmillan, wishes to file an appeal.   Fax received expedited appeal documentation request: Case ID# [id number]  TOC CM/SW call patient to discuss concerns.  Patient reported that she does not feel her discharge is safe given the follow factors: no support in her home, and feels weak.  SW informed patient that home health is in place to begin on 01/10/2020.    Berenice Bouton, MSW, LCSW  [phone] 8am-6pm (weekends)

## 2020-01-10 NOTE — Discharge Summary (Signed)
Physician Discharge Summary  Julie Mcmillan:[id number] DOB: 1941-04-03 DOA: 12/30/2019  PCP: Kirk Ruths, MD  Admit date: 12/30/2019 Discharge date: 01/10/2020  Admitted From: home  Disposition:  Home w/ home health   Recommendations for Outpatient Follow-up:  1. Follow up with PCP in 2 weeks 2. F/u cardio, Dr. Ubaldo Glassing, in 1 week  Home Health: yes Equipment/Devices: oxygen   Discharge Condition: stable CODE STATUS: full  Diet recommendation: Heart Healthy   Brief/Interim Summary: HPI was taken from Dr. Zada Finders: Julie Mcmillan is a 79 y.o. female with medical history significant for interstitial lung disease/pulmonary fibrosis, chronic respiratory failure with hypoxia on 4 L supplemental O2 via [place], paroxysmal atrial fibrillation on Eliquis, pulmonary hypertension, diet-controlled type 2 diabetes, hypertension, hypothyroidism, depression/anxiety who presents to the ED for evaluation of persistent nausea, vomiting, and diarrhea.  Patient reports being in her usual state of health until 2 days ago when she had new onset of nausea, vomiting, and watery diarrhea.  Emesis consists of food products.  She denies any hematemesis or hematochezia.  Symptoms have been persistent and she initially presented today ED yesterday (12/29/2019).  CT abdomen/pelvis showed diverticulosis without evidence of diverticulitis or other acute intra-abdominal or intrapelvic abnormalities.  Portable chest x-ray showed stable extensive fibrosis throughout the lungs.  Patient was given Zofran and treated for UTI empirically with IV ceftriaxone and fosfomycin.  At home, patient has been having continued frequent nausea with vomiting and watery diarrhea therefore she presented to the ED again for further evaluation.  She has been having associated lightheadedness/dizziness without fall or syncope.  She has been feeling generally weak with some mild lower abdominal/suprapubic discomfort.  She denies any  dysuria.  She denies any subjective fevers, chills, diaphoresis, chest pain, palpitations.  She reports chronic intermittent shortness of breath due to her underlying lung disease which is unchanged.  She denies any recent sick contacts or change in diet.  ED Course:  Initial vitals showed BP 119/61, pulse 87, RR 21, temp 99.7 Fahrenheit, SPO2 100% on 4 L supplemental O2 via [place].  Labs show WBC 10.7, hemoglobin 9.3, platelets 234,000, sodium 138, potassium 3.8, bicarb 21, BUN 14, creatinine 0.93, serum glucose 178, LFTs within normal limits, lipase 20.  GI pathogen and C. difficile stool studies were ordered and pending.  SARS-CoV-2 respiratory viral panel is ordered and pending.  Patient was given 1 L normal saline and the hospitalist service was consulted to admit for further evaluation and management.   Hospital Course from Dr. Lenise Herald 4/7-4/11/21: Pt was found to PAF w/ intermittent RVR. Sotalol was d/c by cardio and pt was put on multaq. Pt intermittently required IV cardizem. Pt was initially controlled with multaq, po cardizem but w/ exertion pt would go back into a. fib & some episodes of nonsustained VT. Cardio then put the pt on metoprolol. Since taking metoprolol, cardizem, & multaq, pt's HR was been controlled. Pt will f/u w/ Dr. Ubaldo Glassing outpatient in 1 week. Pt verbalized her understanding. Of note, PT/OT saw the pt and recommended home health. Home health was set up prior to d/c by CM. Please see previous progress notes for more information.     Discharge Diagnoses:  Principal Problem:   Gastroenteritis Active Problems:   Type 2 diabetes mellitus (HCC)   Anemia, iron deficiency   Adult hypothyroidism   Hypertension associated with diabetes ([place])   PAF (paroxysmal atrial fibrillation) (HCC)   Interstitial lung disease (HCC)  Paroxysmal atrial fibrillation: with intermittent  RVR. Continue on cardizem. IV cardizem prn for RVR.Put on IV cardizem drip 01/06/20 evening for RVR  but pt converted to sinus rhythm so this was d/c. With nonsustained VT intermittently. Cardio added metoprolol today.Cardiology spoke to [place], will continue on Multaq 400 mg twice daily. If fails on Multaq, will need ablation.  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 Continue prednisone 10 mg daily. Holding azathioprine for now as above, can resume on discharge. Continue supplemental oxygen, maintaining well on home 4 L [place]  Gastroenteritis: Resolved. CT abdomen/pelvis with contrast on 12/29/2019 without acute etiology.  C. difficile, GI pathogen panel, SARS-CoV-2 PCR-so far negative. Continue to hold azathioprine, colestipol, bactrim for now.  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 Transfused iv iron x2 so far. Will need to follow-up with PCP for outpatient work-up.Pt verbalized their understanding  Pulmonary hypertension: continue tadalafil  Hypertension:continue diltiazem   Hypothyroidism: continue synthroid.  DM2: diet controlled. Will continue to monitor   Depression/anxiety: severity unknown. Continue fluoxetine and BuSpar.   Discharge Instructions  Discharge Instructions    Diet - low sodium heart healthy   Complete by: As directed    Discharge instructions   Complete by: As directed    F/U PCP in 2 weeks; F/u Dr. Ubaldo Glassing in 1 week   Increase activity slowly   Complete by: As directed      Allergies as of 01/10/2020      Reactions   Amlodipine Swelling   Levofloxacin Other (See Comments)   Other reaction(s): Other (See Comments) No flouroquinolones while patient on sotalol  Other reaction(s): Other (See Comments) No flouroquinolones while patient on sotalol.  Other reaction(s): Other (See Comments) No flouroquinolones while patient on sotalol    Nexium [esomeprazole Magnesium] Diarrhea   Aspartame Other (See Comments)   Patient reports "self diagnosed intolerance"  to artificial sweeteners.      Medication List    STOP taking these medications   diltiazem 30 MG tablet Commonly known as: CARDIZEM   sotalol 80 MG tablet Commonly known as: BETAPACE     TAKE these medications   acetaminophen 500 MG tablet Commonly known as: TYLENOL Take 1,000 mg by mouth at bedtime.   Adcirca 20 MG tablet Generic drug: tadalafil (PAH) Take 40 mg by mouth at bedtime.   azaTHIOprine 50 MG tablet Commonly known as: IMURAN Take 50 mg by mouth daily.   busPIRone 7.5 MG tablet Commonly known as: BUSPAR Take 7.5 mg by mouth 2 (two) times daily.   Calcium 600+D 600-200 MG-UNIT Tabs Generic drug: Calcium Carbonate-Vitamin D Take 1 tablet by mouth daily.   cholestyramine 4 g packet Commonly known as: QUESTRAN Take 4 g by mouth daily before breakfast.   colestipol 1 g tablet Commonly known as: COLESTID Take 2 g by mouth 2 (two) times daily.   diltiazem 240 MG 24 hr capsule Commonly known as: CARDIZEM CD Take 1 capsule (240 mg total) by mouth daily. Start taking on: January 11, 2020 What changed:   medication strength  how much to take   dronedarone 400 MG tablet Commonly known as: MULTAQ Take 1 tablet (400 mg total) by mouth 2 (two) times daily with a meal.   Eliquis 5 MG Tabs tablet Generic drug: apixaban Take 5 mg by mouth 2 (two) times daily.   ferrous sulfate 325 (65 FE) MG tablet Take 1 tablet (325 mg total) by mouth daily with breakfast. Start taking on: January 11, 2020  FLUoxetine 40 MG capsule Commonly known as: PROZAC Take 40 mg by mouth daily.   furosemide 20 MG tablet Commonly known as: LASIX Take 20 mg by mouth daily.   ibandronate 150 MG tablet Commonly known as: BONIVA Take 150 mg by mouth every 30 (thirty) days.   levothyroxine 75 MCG tablet Commonly known as: SYNTHROID Take 75 mcg by mouth daily before breakfast.   meclizine 25 MG tablet Commonly known as: ANTIVERT Take 25 mg by mouth 3 (three) times daily as  needed (vertigo).   methylPREDNISolone 8 MG tablet Commonly known as: MEDROL TAKE 1 TABLET BY MOUTH ONCE DAILY FOR 180 DAYS. START AT CONCLUSION OF TAPER FROM 32 MG TO 16 MG (3 WEEK TAPER SEPARATE RX)   metoprolol tartrate 25 MG tablet Commonly known as: LOPRESSOR Take 1 tablet (25 mg total) by mouth 2 (two) times daily.   multivitamin with minerals Tabs tablet Take 1 tablet by mouth daily.   ondansetron 4 MG tablet Commonly known as: ZOFRAN Take 4 mg by mouth every 8 (eight) hours as needed for nausea.   ondansetron 4 MG tablet Commonly known as: Zofran Take 1 tablet (4 mg total) by mouth daily as needed.   pantoprazole 40 MG tablet Commonly known as: PROTONIX Take 1 tablet (40 mg total) by mouth daily.   potassium chloride SA 20 MEQ tablet Commonly known as: KLOR-CON Take 1 tablet (20 mEq total) by mouth daily.   predniSONE 10 MG tablet Commonly known as: DELTASONE Take 10 mg by mouth daily with breakfast.   sulfamethoxazole-trimethoprim 400-80 MG tablet Commonly known as: BACTRIM Take 1 tablet by mouth daily.   vitamin C 500 MG tablet Commonly known as: ASCORBIC ACID Take 500 mg by mouth daily.   zaleplon 5 MG capsule Commonly known as: SONATA Take 5 mg by mouth at bedtime as needed for sleep.      [facility] Follow [facility].   Specialty: Home Health Services Why: Physical Therapy, Occupational Therapy, Nurse, and Social Worker Contact information: [place] [address] [phone]          Allergies  Allergen Reactions  . Amlodipine Swelling  . Levofloxacin Other (See Comments)    Other reaction(s): Other (See Comments) No flouroquinolones while patient on sotalol  Other reaction(s): Other (See Comments) No flouroquinolones while patient on sotalol.  Other reaction(s): Other (See Comments) No flouroquinolones while patient on sotalol   . Nexium [Esomeprazole Magnesium]  Diarrhea  . Aspartame Other (See Comments)    Patient reports "self diagnosed intolerance" to artificial sweeteners.    Consultations:  cardio   Procedures/Studies: CT ABDOMEN PELVIS W CONTRAST  Result Date: 12/29/2019 CLINICAL DATA:  Nausea, vomiting and diarrhea since last night, intermittent abdominal pain rated 4/10, history of type II diabetes mellitus, atrial fibrillation, diverticulitis, GERD, hypertension, irritable bowel syndrome, pulmonary hypertension, pulmonary fibrosis EXAM: CT ABDOMEN AND PELVIS WITH CONTRAST TECHNIQUE: Multidetector CT imaging of the abdomen and pelvis was performed using the standard protocol following bolus administration of intravenous contrast. Sagittal and coronal MPR images reconstructed from axial data set. CONTRAST:  161m OMNIPAQUE IOHEXOL 300 MG/ML SOLN IV. No oral contrast. COMPARISON:  09/04/2019 FINDINGS: Lower chest: Bibasilar pulmonary fibrosis unchanged. Hepatobiliary: Contracted gallbladder. Liver unremarkable Pancreas: Hypoechoic nodule at body of pancreas, 8 x 12 mm, grossly unchanged. No additional pancreatic abnormalities. Spleen: Several tiny nonspecific low-attenuation foci within spleen, nonspecific. Adrenals/Urinary Tract: Adrenal glands normal appearance. Cortical scarring  and small cysts within both kidneys. No enhancing renal mass, hydronephrosis or urinary tract calcification. Bladder and ureters unremarkable. Stomach/Bowel: Low lying cecum in pelvis. Appendix surgically absent by history. Distal colonic diverticulosis without evidence of diverticulitis. Moderate-sized hiatal hernia. Vascular/Lymphatic: Scattered atherosclerotic calcifications aorta and iliac arteries without aneurysm. Additional calcified plaques at the origins of the celiac and superior mesenteric arteries. No adenopathy. Reproductive: Uterus surgically absent with nonvisualization of ovaries Other: No free air or free fluid. No acute inflammatory process or hernia  identified. Musculoskeletal: Osseous demineralization marked compression fracture of T8 vertebral body unchanged since another CT of 10/26/2016. IMPRESSION: Distal colonic diverticulosis without evidence of diverticulitis. Moderate-sized hiatal hernia. Stable cystic lesion at body of pancreas 8 x 12 x 14 mm. Stable bibasilar pulmonary fibrosis. Stable marked compression fracture T8 vertebral body. No acute intra-abdominal or intrapelvic abnormalities. Aortic Atherosclerosis (ICD10-I70.0). Electronically Signed   By: Lavonia Dana M.D.   On: 12/29/2019 13:09   DG Chest Port 1 View  Result Date: 01/06/2020 CLINICAL DATA:  Hypoxia. EXAM: PORTABLE CHEST 1 VIEW COMPARISON:  12/29/2019 FINDINGS: Heart size is normal. Chronic fibrotic lung disease peers similar to previous studies. No evidence of acute consolidation, collapse or effusion. Low level inflammatory changes could be hidden within the extensive chronic findings. No bone abnormality. IMPRESSION: Chronic fibrotic lung disease without visible change. Lower level inflammatory changes could be hidden within the extensive chronic findings. Electronically Signed   By: Nelson Chimes M.D.   On: 01/06/2020 12:23   DG Chest Portable 1 View  Result Date: 12/29/2019 CLINICAL DATA:  Pain with nausea and vomiting EXAM: PORTABLE CHEST 1 VIEW COMPARISON:  August 10, 2019 FINDINGS: There is extensive fibrosis throughout the lungs bilaterally. There is no frank edema or consolidation. Heart is upper normal in size with pulmonary vascularity normal. No adenopathy. No bone lesions. There is aortic atherosclerosis. IMPRESSION: Extensive fibrosis throughout the lungs, stable. No edema or airspace opacity. Stable cardiac silhouette. No adenopathy. Aortic Atherosclerosis (ICD10-I70.0). Electronically Signed   By: Lowella Grip III M.D.   On: 12/29/2019 13:31   ECHOCARDIOGRAM COMPLETE  Result Date: 01/03/2020    ECHOCARDIOGRAM REPORT   Patient Name:   MAGGI HERSHKOWITZ Date of  Exam: 01/02/2020 Medical Rec #:  [id number]        Height:       64.0 in Accession #:    [id number]       Weight:       147.0 lb Date of Birth:  July 08, 1941         BSA:          1.716 m Patient Age:    79 years         BP:           144/61 mmHg Patient Gender: F                HR:           66 bpm. Exam Location:  [facility] Procedure: 2D Echo Indications:     Abnormal ECG 794.31/ R94.31  History:         Patient has prior history of Echocardiogram examinations, most                  recent 03/26/2015.  Sonographer:     Arville Go RDCS Referring Phys:  [id number] [place] Diagnosing Phys: Bartholome Bill MD IMPRESSIONS  1. Left ventricular ejection fraction, by estimation, is 60 to 65%. The left ventricle has normal function.  The left ventricle has no regional wall motion abnormalities. There is mild asymmetric left ventricular hypertrophy of the basal-septal segment. Left ventricular diastolic parameters are consistent with Grade I diastolic dysfunction (impaired relaxation).  2. Right ventricular systolic function is normal. The right ventricular size is normal.  3. Left atrial size was mildly dilated.  4. Right atrial size was mildly dilated.  5. The mitral valve is grossly normal. Trivial mitral valve regurgitation.  6. The aortic valve is tricuspid. Aortic valve regurgitation is mild. No aortic stenosis is present. FINDINGS  Left Ventricle: Left ventricular ejection fraction, by estimation, is 60 to 65%. The left ventricle has normal function. The left ventricle has no regional wall motion abnormalities. The left ventricular internal cavity size was normal in size. There is  mild asymmetric left ventricular hypertrophy of the basal-septal segment. Left ventricular diastolic parameters are consistent with Grade I diastolic dysfunction (impaired relaxation). Right Ventricle: The right ventricular size is normal. No increase in right ventricular wall thickness. Right ventricular systolic function is normal. Left Atrium:  Left atrial size was mildly dilated. Right Atrium: Right atrial size was mildly dilated. Pericardium: There is no evidence of pericardial effusion. Mitral Valve: The mitral valve is grossly normal. Trivial mitral valve regurgitation. Tricuspid Valve: The tricuspid valve is normal in structure. Tricuspid valve regurgitation is mild. Aortic Valve: The aortic valve is tricuspid. Aortic valve regurgitation is mild. Aortic regurgitation PHT measures 639 msec. No aortic stenosis is present. Aortic valve peak gradient measures 11.2 mmHg. Pulmonic Valve: The pulmonic valve was grossly normal. Pulmonic valve regurgitation is trivial. Aorta: The aortic root is normal in size and structure. IAS/Shunts: The interatrial septum was not assessed.  LEFT VENTRICLE PLAX 2D LVIDd:         4.31 cm  Diastology LVIDs:         2.89 cm  LV e' lateral:   4.68 cm/s LV PW:         1.32 cm  LV E/e' lateral: 19.5 LV IVS:        1.14 cm  LV e' medial:    4.46 cm/s LVOT diam:     1.90 cm  LV E/e' medial:  20.4 LV SV:         67 LV SV Index:   39 LVOT Area:     2.84 cm  RIGHT VENTRICLE RV Basal diam:  3.23 cm RV S prime:     16.60 cm/s TAPSE (M-mode): 2.2 cm LEFT ATRIUM             Index       RIGHT ATRIUM           Index LA diam:        3.90 cm 2.27 cm/m  RA Area:     12.80 cm LA Vol (A2C):   25.2 ml 14.68 ml/m RA Volume:   29.60 ml  17.25 ml/m LA Vol (A4C):   37.0 ml 21.56 ml/m LA Biplane Vol: 32.7 ml 19.05 ml/m  AORTIC VALVE                PULMONIC VALVE AV Area (Vmax): 1.90 cm    PV Vmax:       1.17 m/s AV Vmax:        167.00 cm/s PV Peak grad:  5.5 mmHg AV Peak Grad:   11.2 mmHg LVOT Vmax:      112.00 cm/s LVOT Vmean:     64.500 cm/s LVOT VTI:  0.235 m AI PHT:         639 msec  AORTA Ao Root diam: 2.90 cm Ao Asc diam:  2.70 cm MITRAL VALVE                TRICUSPID VALVE MV Area (PHT): 3.42 cm     TV Peak grad:   55.2 mmHg MV Decel Time: 222 msec     TV Vmax:        3.72 m/s MV E velocity: 91.20 cm/s MV A velocity: 127.00 cm/s   SHUNTS MV E/A ratio:  0.72         Systemic VTI:  0.24 m                             Systemic Diam: 1.90 cm Bartholome Bill MD Electronically signed by Bartholome Bill MD Signature Date/Time: 01/03/2020/9:14:55 AM    Final        Subjective: Pt states this is the best she has felt since she has been in the hospital.    Discharge Exam: Vitals:   01/10/20 1128 01/10/20 1129  BP:    Pulse: 69 67  Resp: 15 14  Temp:    SpO2: 100% 100%   Vitals:   01/10/20 1126 01/10/20 1127 01/10/20 1128 01/10/20 1129  BP:      Pulse: 69 66 69 67  Resp: _0 Temp:      TempSrc:      SpO2: 97% 99% 100% 100%  Weight:      Height:        General: Pt is alert, awake, not in acute distress Cardiovascular: S1/S2 +, no rubs, no gallops Respiratory: course breath sounds b/l Abdominal: Soft, NT, ND, bowel sounds + Extremities: no edema, no cyanosis    The results of significant diagnostics from this hospitalization (including imaging, microbiology, ancillary and laboratory) are listed below for reference.     Microbiology: No results found for this or any previous visit (from the past 240 hour(s)).   Labs: BNP (last 3 results) Recent Labs    01/02/20 0502  BNP 195.9*   Basic Metabolic Panel: Recent Labs  Lab 01/04/20 0528 01/07/20 0548 01/08/20 0644 01/09/20 0628 01/10/20 0622  NA 137 135 135 138 137  K 4.5 4.6 4.4 4.3 4.1  CL 100 100 99 100 98  CO2 _1 GLUCOSE 108* 114* 98 101* 102*  BUN _2 CREATININE 0.89 0.91 0.99 1.09* 1.05*  CALCIUM 8.8* 8.8* 8.7* 8.9 8.8*   Liver Function Tests: No results for input(s): AST, ALT, ALKPHOS, BILITOT, PROT, ALBUMIN in the last 168 hours. No results for input(s): LIPASE, AMYLASE in the last 168 hours. No results for input(s): AMMONIA in the last 168 hours. CBC: Recent Labs  Lab 01/05/20 0531 01/07/20 0548 01/08/20 0644 01/09/20 0628 01/10/20 0622  WBC 10.8* 9.0 8.9 8.3 8.7  HGB 8.7* 8.8* 8.9* 9.2* 9.7*   HCT 28.3* 29.5* 29.5* 30.1* 31.1*  MCV 71.3* 75.3* 75.3* 76.8* 76.6*  PLT 289 276 276 268 249   Cardiac Enzymes: No results for input(s): CKTOTAL, CKMB, CKMBINDEX, TROPONINI in the last 168 hours. BNP: Invalid input(s): POCBNP CBG: No results for input(s): GLUCAP in the last 168 hours. D-Dimer No results for input(s): DDIMER in the last 72 hours. Hgb A1c No results for input(s): HGBA1C in the last 72 hours. Lipid Profile No results for  input(s): CHOL, HDL, LDLCALC, TRIG, CHOLHDL, LDLDIRECT in the last 72 hours. Thyroid function studies No results for input(s): TSH, T4TOTAL, T3FREE, THYROIDAB in the last 72 hours.  Invalid input(s): FREET3 Anemia work up No results for input(s): VITAMINB12, FOLATE, FERRITIN, TIBC, IRON, RETICCTPCT in the last 72 hours. Urinalysis    Component Value Date/Time   COLORURINE YELLOW (A) 12/31/2019 0029   APPEARANCEUR HAZY (A) 12/31/2019 0029   APPEARANCEUR Clear 11/04/2018 1531   LABSPEC 1.020 12/31/2019 0029   PHURINE 5.0 12/31/2019 0029   GLUCOSEU NEGATIVE 12/31/2019 0029   HGBUR NEGATIVE 12/31/2019 0029   BILIRUBINUR NEGATIVE 12/31/2019 0029   BILIRUBINUR Negative 11/04/2018 1531   KETONESUR NEGATIVE 12/31/2019 0029   PROTEINUR 30 (A) 12/31/2019 0029   NITRITE NEGATIVE 12/31/2019 0029   LEUKOCYTESUR MODERATE (A) 12/31/2019 0029   Sepsis Labs Invalid input(s): PROCALCITONIN,  WBC,  LACTICIDVEN Microbiology No results found for this or any previous visit (from the past 240 hour(s)).   Time coordinating discharge: Over 30 minutes  SIGNED:   Wyvonnia Dusky, MD  [facility] 01/10/2020, 1:30 PM Pager   If 7PM-7AM, please contact night-coverage [email or web address]

## 2020-01-10 NOTE — Progress Notes (Signed)
[facility] Encounter Note  Patient: Julie Mcmillan / Admit Date: 12/30/2019 / Date of Encounter: 01/10/2020, 7:42 AM   Subjective: Patient has had episodes of paroxysmal nonvalvular atrial fibrillation for which she has had more more episodes of irregularity of her heartbeat.  This has been previously treated with sotalol but now has been changed to Multaq.  Additional diltiazem has been used but now has had another multiple episodes of atrial flutter with rapid ventricular rate.  Multaq sotalol do not typically work well with conversion of atrial flutter and therefore cardioversion either spontaneously and/or electrically works better or if patient may need future treatment with ablation. There has been addition of low-dose of beta-blocker which is controlled heart rate and potentially reduce the risk of having episodes of atrial flutter.  Patient has tolerated this medication management change well Review of Systems: Positive for: None Negative for: Vision change, hearing change, syncope, dizziness, nausea, vomiting,diarrhea, bloody stool, stomach pain, cough, congestion, diaphoresis, urinary frequency, urinary pain,skin lesions, skin rashes Others previously listed  Objective: Telemetry: Atrial flutter with rapid rate Physical Exam: Blood pressure 118/66, pulse (!) 58, temperature 98.1 F (36.7 C), temperature source Oral, resp. rate 16, height _0  (1.626 m), weight 65 kg, SpO2 100 %. Body mass index is 24.58 kg/m. General: Well developed, well nourished, in no acute distress. Head: Normocephalic, atraumatic, sclera non-icteric, no xanthomas, nares are without discharge. Neck: No apparent masses Lungs: Normal respirations with no wheezes, no rhonchi, no rales , no crackles   Heart: Regular rate and rhythm, normal S1 S2, no murmur, no rub, no gallop, PMI is normal size and placement, carotid upstroke normal without bruit, jugular venous pressure normal Abdomen:  Soft, non-tender, non-distended with normoactive bowel sounds. No hepatosplenomegaly. Abdominal aorta is normal size without bruit Extremities: No edema, no clubbing, no cyanosis, no ulcers,  Peripheral: 2+ radial, 2+ femoral, 2+ dorsal pedal pulses Neuro: Alert and oriented. Moves all extremities spontaneously. Psych:  Responds to questions appropriately with a normal affect.   Intake/Output Summary (Last 24 hours) at 01/10/2020 0742 Last data filed at 01/10/2020 0430 Gross per 24 hour  Intake 840 ml  Output 1150 ml  Net -310 ml    Inpatient Medications:  . apixaban  5 mg Oral BID  . busPIRone  7.5 mg Oral BID  . calcium carbonate  1 tablet Oral BID  . diltiazem  240 mg Oral Daily  . dronedarone  400 mg Oral BID WC  . famotidine  20 mg Oral Daily  . ferrous sulfate  325 mg Oral Q breakfast  . FLUoxetine  40 mg Oral Daily  . furosemide  20 mg Oral Daily  . levothyroxine  75 mcg Oral QAC breakfast  . metoprolol tartrate  25 mg Oral BID  . off the beat book   Does not apply Once  . potassium chloride  20 mEq Oral BID  . predniSONE  10 mg Oral Q breakfast  . sodium chloride flush  3 mL Intravenous Q12H  . tadalafil (PAH)  40 mg Oral QHS  . traZODone  50 mg Oral QHS   Infusions:  . sodium chloride 500 mL/hr at 01/07/20 0351    Labs: Recent Labs    01/09/20 0628 01/10/20 0622  NA 138 137  K 4.3 4.1  CL 100 98  CO2 29 29  GLUCOSE 101* 102*  BUN 20 19  CREATININE 1.09* 1.05*  CALCIUM 8.9 8.8*   No results for input(s): AST, ALT, ALKPHOS,  BILITOT, PROT, ALBUMIN in the last 72 hours. Recent Labs    01/09/20 0628 01/10/20 0622  WBC 8.3 8.7  HGB 9.2* 9.7*  HCT 30.1* 31.1*  MCV 76.8* 76.6*  PLT 268 249   No results for input(s): CKTOTAL, CKMB, TROPONINI in the last 72 hours. Invalid input(s): POCBNP No results for input(s): HGBA1C in the last 72 hours.   Weights: Filed Weights   01/08/20 0408 01/09/20 0459 01/10/20 0430  Weight: 65.9 kg 64.9 kg 65 kg      Radiology/Studies:  CT ABDOMEN PELVIS W CONTRAST  Result Date: 12/29/2019 CLINICAL DATA:  Nausea, vomiting and diarrhea since last night, intermittent abdominal pain rated 4/10, history of type II diabetes mellitus, atrial fibrillation, diverticulitis, GERD, hypertension, irritable bowel syndrome, pulmonary hypertension, pulmonary fibrosis EXAM: CT ABDOMEN AND PELVIS WITH CONTRAST TECHNIQUE: Multidetector CT imaging of the abdomen and pelvis was performed using the standard protocol following bolus administration of intravenous contrast. Sagittal and coronal MPR images reconstructed from axial data set. CONTRAST:  120m OMNIPAQUE IOHEXOL 300 MG/ML SOLN IV. No oral contrast. COMPARISON:  09/04/2019 FINDINGS: Lower chest: Bibasilar pulmonary fibrosis unchanged. Hepatobiliary: Contracted gallbladder. Liver unremarkable Pancreas: Hypoechoic nodule at body of pancreas, 8 x 12 mm, grossly unchanged. No additional pancreatic abnormalities. Spleen: Several tiny nonspecific low-attenuation foci within spleen, nonspecific. Adrenals/Urinary Tract: Adrenal glands normal appearance. Cortical scarring and small cysts within both kidneys. No enhancing renal mass, hydronephrosis or urinary tract calcification. Bladder and ureters unremarkable. Stomach/Bowel: Low lying cecum in pelvis. Appendix surgically absent by history. Distal colonic diverticulosis without evidence of diverticulitis. Moderate-sized hiatal hernia. Vascular/Lymphatic: Scattered atherosclerotic calcifications aorta and iliac arteries without aneurysm. Additional calcified plaques at the origins of the celiac and superior mesenteric arteries. No adenopathy. Reproductive: Uterus surgically absent with nonvisualization of ovaries Other: No free air or free fluid. No acute inflammatory process or hernia identified. Musculoskeletal: Osseous demineralization marked compression fracture of T8 vertebral body unchanged since another CT of 10/26/2016. IMPRESSION:  Distal colonic diverticulosis without evidence of diverticulitis. Moderate-sized hiatal hernia. Stable cystic lesion at body of pancreas 8 x 12 x 14 mm. Stable bibasilar pulmonary fibrosis. Stable marked compression fracture T8 vertebral body. No acute intra-abdominal or intrapelvic abnormalities. Aortic Atherosclerosis (ICD10-I70.0). Electronically Signed   By: MLavonia DanaM.D.   On: 12/29/2019 13:09   DG Chest Port 1 View  Result Date: 01/06/2020 CLINICAL DATA:  Hypoxia. EXAM: PORTABLE CHEST 1 VIEW COMPARISON:  12/29/2019 FINDINGS: Heart size is normal. Chronic fibrotic lung disease peers similar to previous studies. No evidence of acute consolidation, collapse or effusion. Low level inflammatory changes could be hidden within the extensive chronic findings. No bone abnormality. IMPRESSION: Chronic fibrotic lung disease without visible change. Lower level inflammatory changes could be hidden within the extensive chronic findings. Electronically Signed   By: MNelson ChimesM.D.   On: 01/06/2020 12:23   DG Chest Portable 1 View  Result Date: 12/29/2019 CLINICAL DATA:  Pain with nausea and vomiting EXAM: PORTABLE CHEST 1 VIEW COMPARISON:  August 10, 2019 FINDINGS: There is extensive fibrosis throughout the lungs bilaterally. There is no frank edema or consolidation. Heart is upper normal in size with pulmonary vascularity normal. No adenopathy. No bone lesions. There is aortic atherosclerosis. IMPRESSION: Extensive fibrosis throughout the lungs, stable. No edema or airspace opacity. Stable cardiac silhouette. No adenopathy. Aortic Atherosclerosis (ICD10-I70.0). Electronically Signed   By: WLowella GripIII M.D.   On: 12/29/2019 13:31   ECHOCARDIOGRAM COMPLETE  Result Date: 01/03/2020    ECHOCARDIOGRAM  REPORT   Patient Name:   ALENA BLANKENBECKLER Date of Exam: 01/02/2020 Medical Rec #:  [id number]        Height:       64.0 in Accession #:    [id number]       Weight:       147.0 lb Date of Birth:  [birth year]          BSA:          1.716 m Patient Age:    12 years         BP:           144/61 mmHg Patient Gender: F                HR:           66 bpm. Exam Location:  [facility] Procedure: 2D Echo Indications:     Abnormal ECG 794.31/ R94.31  History:         Patient has prior history of Echocardiogram examinations, most                  recent 03/26/2015.  Sonographer:     Arville Go RDCS Referring Phys:  [id number] Shannon Diagnosing Phys: Bartholome Bill MD IMPRESSIONS  1. Left ventricular ejection fraction, by estimation, is 60 to 65%. The left ventricle has normal function. The left ventricle has no regional wall motion abnormalities. There is mild asymmetric left ventricular hypertrophy of the basal-septal segment. Left ventricular diastolic parameters are consistent with Grade I diastolic dysfunction (impaired relaxation).  2. Right ventricular systolic function is normal. The right ventricular size is normal.  3. Left atrial size was mildly dilated.  4. Right atrial size was mildly dilated.  5. The mitral valve is grossly normal. Trivial mitral valve regurgitation.  6. The aortic valve is tricuspid. Aortic valve regurgitation is mild. No aortic stenosis is present. FINDINGS  Left Ventricle: Left ventricular ejection fraction, by estimation, is 60 to 65%. The left ventricle has normal function. The left ventricle has no regional wall motion abnormalities. The left ventricular internal cavity size was normal in size. There is  mild asymmetric left ventricular hypertrophy of the basal-septal segment. Left ventricular diastolic parameters are consistent with Grade I diastolic dysfunction (impaired relaxation). Right Ventricle: The right ventricular size is normal. No increase in right ventricular wall thickness. Right ventricular systolic function is normal. Left Atrium: Left atrial size was mildly dilated. Right Atrium: Right atrial size was mildly dilated. Pericardium: There is no evidence of pericardial effusion. Mitral  Valve: The mitral valve is grossly normal. Trivial mitral valve regurgitation. Tricuspid Valve: The tricuspid valve is normal in structure. Tricuspid valve regurgitation is mild. Aortic Valve: The aortic valve is tricuspid. Aortic valve regurgitation is mild. Aortic regurgitation PHT measures 639 msec. No aortic stenosis is present. Aortic valve peak gradient measures 11.2 mmHg. Pulmonic Valve: The pulmonic valve was grossly normal. Pulmonic valve regurgitation is trivial. Aorta: The aortic root is normal in size and structure. IAS/Shunts: The interatrial septum was not assessed.  LEFT VENTRICLE PLAX 2D LVIDd:         4.31 cm  Diastology LVIDs:         2.89 cm  LV e' lateral:   4.68 cm/s LV PW:         1.32 cm  LV E/e' lateral: 19.5 LV IVS:        1.14 cm  LV e' medial:    4.46 cm/s LVOT diam:  1.90 cm  LV E/e' medial:  20.4 LV SV:         67 LV SV Index:   39 LVOT Area:     2.84 cm  RIGHT VENTRICLE RV Basal diam:  3.23 cm RV S prime:     16.60 cm/s TAPSE (M-mode): 2.2 cm LEFT ATRIUM             Index       RIGHT ATRIUM           Index LA diam:        3.90 cm 2.27 cm/m  RA Area:     12.80 cm LA Vol (A2C):   25.2 ml 14.68 ml/m RA Volume:   29.60 ml  17.25 ml/m LA Vol (A4C):   37.0 ml 21.56 ml/m LA Biplane Vol: 32.7 ml 19.05 ml/m  AORTIC VALVE                PULMONIC VALVE AV Area (Vmax): 1.90 cm    PV Vmax:       1.17 m/s AV Vmax:        167.00 cm/s PV Peak grad:  5.5 mmHg AV Peak Grad:   11.2 mmHg LVOT Vmax:      112.00 cm/s LVOT Vmean:     64.500 cm/s LVOT VTI:       0.235 m AI PHT:         639 msec  AORTA Ao Root diam: 2.90 cm Ao Asc diam:  2.70 cm MITRAL VALVE                TRICUSPID VALVE MV Area (PHT): 3.42 cm     TV Peak grad:   55.2 mmHg MV Decel Time: 222 msec     TV Vmax:        3.72 m/s MV E velocity: 91.20 cm/s MV A velocity: 127.00 cm/s  SHUNTS MV E/A ratio:  0.72         Systemic VTI:  0.24 m                             Systemic Diam: 1.90 cm Bartholome Bill MD Electronically signed by Bartholome Bill MD Signature Date/Time: 01/03/2020/9:14:55 AM    Final      Assessment and Recommendation  78 y.o. female with known paroxysmal nonvalvular atrial fibrillation very well controlled at this time on appropriate medication management although now with new atrial flutter with rapid rate 1.  Continue anticoagulation for further risk reduction in stroke with atrial fibrillation and/or atrial flutter 2.  Continue Multaq diltiazem combination for atrial fibrillation combination treatment 3.  Continuation of low-dose added metoprolol for possible control of atrial flutter and reduction rapid heart rate 4.  May require ablation of atrial flutter if not well controlled but this will be decision made as an outpatient 5.  Begin ambulation and follow for improvements of symptoms and okay for discharge home today with follow-up later this week  Signed, Serafina Royals M.D. FACC

## 2020-01-10 NOTE — TOC Progression Note (Addendum)
Transition of Care [facility]) - Progression Note    Patient Details  Name: Julie Mcmillan MRN: [id number] Date of Birth: 1940-10-25  [facility] CM/SW Contact  Eileen Stanford, LCSW Phone Number: 01/08/2020, 3:44 PM  Clinical Narrative:   CSW spoke with pt at bedside. CSW explained that PT is recommending HHPT. Pt states she can not go home. CSW explained that [facility] is going to deny SNF request due to the given recommendation. Pt is ambulating too far. CSW explained that the next option is HH. Pt began to understand. Pt was ok with CSW looking for A HH agency.    Expected Discharge Plan: Assisted Living Barriers to Discharge: No Barriers Identified  Expected Discharge Plan and Services Expected Discharge Plan: Assisted Living In-house Referral: Clinical Social Work Discharge Planning Services: CM Consult   Living arrangements for the past 2 months: Apartment Expected Discharge Date: 01/10/20                         HH Arranged: PT, OT, RN, Social Work [employer] Agency: [facility] Date [place]: 01/09/20 Time [place]: 1423 Representative spoke with at [place]: [place]   Social Determinants of Health ([place]) Interventions    Readmission Risk Interventions No flowsheet data found.

## 2020-01-11 DIAGNOSIS — I480 Paroxysmal atrial fibrillation: Secondary | ICD-10-CM | POA: Diagnosis not present

## 2020-01-11 DIAGNOSIS — E119 Type 2 diabetes mellitus without complications: Secondary | ICD-10-CM | POA: Diagnosis not present

## 2020-01-11 DIAGNOSIS — J8410 Pulmonary fibrosis, unspecified: Secondary | ICD-10-CM | POA: Diagnosis not present

## 2020-01-11 LAB — BASIC METABOLIC PANEL
Anion gap: 7 (ref 5–15)
BUN: 21 mg/dL (ref 8–23)
CO2: 29 mmol/L (ref 22–32)
Calcium: 8.9 mg/dL (ref 8.9–10.3)
Chloride: 100 mmol/L (ref 98–111)
Creatinine, Ser: 1.21 mg/dL — ABNORMAL HIGH (ref 0.44–1.00)
GFR calc Af Amer: 49 mL/min — ABNORMAL LOW (ref >60)
GFR calc non Af Amer: 42 mL/min — ABNORMAL LOW (ref >60)
Glucose, Bld: 105 mg/dL — ABNORMAL HIGH (ref 70–99)
Potassium: 5.1 mmol/L (ref 3.5–5.1)
Sodium: 136 mmol/L (ref 135–145)

## 2020-01-11 LAB — CBC
HCT: 31.9 % — ABNORMAL LOW (ref 36.0–46.0)
Hemoglobin: 9.8 g/dL — ABNORMAL LOW (ref 12.0–15.0)
MCH: 23.8 pg — ABNORMAL LOW (ref 26.0–34.0)
MCHC: 30.7 g/dL (ref 30.0–36.0)
MCV: 77.4 fL — ABNORMAL LOW (ref 80.0–100.0)
Platelets: 255 10*3/uL (ref 150–400)
RBC: 4.12 MIL/uL (ref 3.87–5.11)
RDW: 26.8 % — ABNORMAL HIGH (ref 11.5–15.5)
WBC: 9 10*3/uL (ref 4.0–10.5)
nRBC: 0 % (ref 0.0–0.2)

## 2020-01-11 NOTE — Progress Notes (Signed)
PROGRESS NOTE    Julie Mcmillan  ERD:[id number] DOB: 07-Apr-1941 DOA: 12/30/2019 PCP: Kirk Ruths, MD       Assessment & Plan:   Principal Problem:   Gastroenteritis Active Problems:   Type 2 diabetes mellitus ([place])   Anemia, iron deficiency   Adult hypothyroidism   Hypertension associated with diabetes ([place])   PAF (paroxysmal atrial fibrillation) (HCC)   Interstitial lung disease (H[facility])   Paroxysmal atrial fibrillation: with intermittent RVR. Continue on cardizem. IV cardizem prn for RVR.Put on IV cardizem drip 01/06/20 evening for RVR but pt converted to sinus rhythm so this was d/c.Withnonsustained VT intermittently. Cardio added metoprolol 01/11/20.Cardiology spoke to [place], will continue on Multaq 400 mg twice daily. If fails on Multaq, will need ablation.May bave some bradycardia w/ BB & CCB use.   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 Continue prednisone 10 mg daily. Holding azathioprine for now as above, can resume on discharge. Continue supplemental oxygen, maintaining well on home 4 L [place]  Gastroenteritis: Resolved. CT abdomen/pelvis with contrast on 12/29/2019 without acute etiology. C. difficile, GI pathogen panel, SARS-CoV-2 PCR-so far negative. Continue to hold azathioprine, colestipol, bactrim for now. 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 Transfused iv iron x2 so far. Will need to follow-up with PCP for outpatient work-up.Pt verbalized their understanding  Pulmonary hypertension: continue tadalafil  Hypertension:continue diltiazem, metoprolol   Hypothyroidism: continue synthroid.  DM2: diet controlled. Will continue to monitor   Depression/anxiety: severity unknown. Continue fluoxetine and BuSpar.  DVT prophylaxis: eliquis Code Status: full  Family Communication:  Disposition Plan: stable for d/c and pt was d/c  yesterday but pt's daughter appealed d/c. PT/OT recs home health which was set up already but not enough for pt as per pt's daughter. Awaiting further recs as per CM   Consultants:   cardio   Procedures:    Antimicrobials:    Subjective: Pt c/o fatigue.  Objective: Vitals:   01/10/20 1129 01/10/20 1549 01/10/20 1936 01/11/20 0421  BP:  (!) 101/46 (!) 103/59 (!) 108/43  Pulse: 67 61 (!) 58 (!) 59  Resp: 14 19    Temp:   98.2 F (36.8 C) 97.8 F (36.6 C)  TempSrc:   Oral Oral  SpO2: 100% 99% 99% 100%  Weight:    64.5 kg  Height:        Intake/Output Summary (Last 24 hours) at 01/11/2020 0805 Last data filed at 01/11/2020 0539 Gross per 24 hour  Intake 480 ml  Output 250 ml  Net 230 ml   Filed Weights   01/09/20 0459 01/10/20 0430 01/11/20 0421  Weight: 64.9 kg 65 kg 64.5 kg    Examination:  General exam: Appears calm and comfortable  Respiratory system: Course breath sounds b/l Cardiovascular system: S1/S2+. No  rubs, gallops or clicks.  Gastrointestinal system: Abdomen is nondistended, soft and nontender. normal bowel sounds heard. Central nervous system: Alert and oriented. Moves all 4 extremities Psychiatry: Judgement and insight appear normal. Mood & affect appropriate.     Data Reviewed: I have personally reviewed following labs and imaging studies  CBC: Recent Labs  Lab 01/07/20 0548 01/08/20 0644 01/09/20 0628 01/10/20 0622 01/11/20 0330  WBC 9.0 8.9 8.3 8.7 9.0  HGB 8.8* 8.9* 9.2* 9.7* 9.8*  HCT 29.5* 29.5* 30.1* 31.1* 31.9*  MCV 75.3* 75.3* 76.8* 76.6* 77.4*  PLT 276 276 268 249 563   Basic Metabolic Panel: Recent Labs  Lab 01/07/20 0548 01/08/20 0644 01/09/20 0628 01/10/20 0622 01/11/20 0330  NA 135 135 138 137 136  K 4.6 4.4 4.3 4.1 5.1  CL 100 99 100 98 100  CO2 _0 GLUCOSE 114* 98 101* 102* 105*  BUN _1 CREATININE 0.91 0.99 1.09* 1.05* 1.21*  CALCIUM 8.8* 8.7* 8.9 8.8* 8.9   GFR: Estimated  Creatinine Clearance: 32 mL/min (A) (by C-G formula based on SCr of 1.21 mg/dL (H)). Liver Function Tests: No results for input(s): AST, ALT, ALKPHOS, BILITOT, PROT, ALBUMIN in the last 168 hours. No results for input(s): LIPASE, AMYLASE in the last 168 hours. No results for input(s): AMMONIA in the last 168 hours. Coagulation Profile: No results for input(s): INR, PROTIME in the last 168 hours. Cardiac Enzymes: No results for input(s): CKTOTAL, CKMB, CKMBINDEX, TROPONINI in the last 168 hours. BNP (last 3 results) No results for input(s): PROBNP in the last 8760 hours. HbA1C: No results for input(s): HGBA1C in the last 72 hours. CBG: No results for input(s): GLUCAP in the last 168 hours. Lipid Profile: No results for input(s): CHOL, HDL, LDLCALC, TRIG, CHOLHDL, LDLDIRECT in the last 72 hours. Thyroid Function Tests: No results for input(s): TSH, T4TOTAL, FREET4, T3FREE, THYROIDAB in the last 72 hours. Anemia Panel: No results for input(s): VITAMINB12, FOLATE, FERRITIN, TIBC, IRON, RETICCTPCT in the last 72 hours. Sepsis Labs: No results for input(s): PROCALCITON, LATICACIDVEN in the last 168 hours.  No results found for this or any previous visit (from the past 240 hour(s)).       Radiology Studies: No results found.      Scheduled Meds: . apixaban  5 mg Oral BID  . busPIRone  7.5 mg Oral BID  . calcium carbonate  1 tablet Oral BID  . diltiazem  240 mg Oral Daily  . dronedarone  400 mg Oral BID WC  . famotidine  20 mg Oral Daily  . ferrous sulfate  325 mg Oral Q breakfast  . FLUoxetine  40 mg Oral Daily  . furosemide  20 mg Oral Daily  . levothyroxine  75 mcg Oral QAC breakfast  . metoprolol tartrate  25 mg Oral BID  . off the beat book   Does not apply Once  . predniSONE  10 mg Oral Q breakfast  . sodium chloride flush  3 mL Intravenous Q12H  . tadalafil (PAH)  40 mg Oral QHS  . traZODone  50 mg Oral QHS   Continuous Infusions: . sodium chloride 500 mL/hr at  01/07/20 0351     LOS: 11 days    Time spent: 36 mins    Wyvonnia Dusky, MD [facility] Pager [phone]  If 7PM-7AM, please contact night-coverage [email or web address] 01/11/2020, 8:05 AM

## 2020-01-11 NOTE — Progress Notes (Signed)
Pt resting quietly w/ eyes closed.  Resp even and unlabored.  No observed needs at this time.

## 2020-01-12 DIAGNOSIS — I480 Paroxysmal atrial fibrillation: Secondary | ICD-10-CM | POA: Diagnosis not present

## 2020-01-12 DIAGNOSIS — J8410 Pulmonary fibrosis, unspecified: Secondary | ICD-10-CM | POA: Diagnosis not present

## 2020-01-12 DIAGNOSIS — E119 Type 2 diabetes mellitus without complications: Secondary | ICD-10-CM | POA: Diagnosis not present

## 2020-01-12 LAB — CBC
HCT: 29.9 % — ABNORMAL LOW (ref 36.0–46.0)
Hemoglobin: 9.3 g/dL — ABNORMAL LOW (ref 12.0–15.0)
MCH: 24.4 pg — ABNORMAL LOW (ref 26.0–34.0)
MCHC: 31.1 g/dL (ref 30.0–36.0)
MCV: 78.5 fL — ABNORMAL LOW (ref 80.0–100.0)
Platelets: 259 10*3/uL (ref 150–400)
RBC: 3.81 MIL/uL — ABNORMAL LOW (ref 3.87–5.11)
RDW: 27.3 % — ABNORMAL HIGH (ref 11.5–15.5)
WBC: 8.6 10*3/uL (ref 4.0–10.5)
nRBC: 0 % (ref 0.0–0.2)

## 2020-01-12 LAB — BASIC METABOLIC PANEL
Anion gap: 9 (ref 5–15)
BUN: 24 mg/dL — ABNORMAL HIGH (ref 8–23)
CO2: 27 mmol/L (ref 22–32)
Calcium: 8.8 mg/dL — ABNORMAL LOW (ref 8.9–10.3)
Chloride: 102 mmol/L (ref 98–111)
Creatinine, Ser: 1.22 mg/dL — ABNORMAL HIGH (ref 0.44–1.00)
GFR calc Af Amer: 48 mL/min — ABNORMAL LOW (ref >60)
GFR calc non Af Amer: 42 mL/min — ABNORMAL LOW (ref >60)
Glucose, Bld: 119 mg/dL — ABNORMAL HIGH (ref 70–99)
Potassium: 4.3 mmol/L (ref 3.5–5.1)
Sodium: 138 mmol/L (ref 135–145)

## 2020-01-12 MED ORDER — ADULT MULTIVITAMIN W/MINERALS CH
1.0000 | ORAL_TABLET | Freq: Every day | ORAL | Status: DC
  Administered 2020-01-13 – 2020-01-15 (×3): 1 via ORAL
  Filled 2020-01-12 (×3): qty 1

## 2020-01-12 MED ORDER — SODIUM CHLORIDE 0.9 % IV BOLUS
1000.0000 mL | Freq: Once | INTRAVENOUS | Status: AC
  Administered 2020-01-12: 08:00:00 1000 mL via INTRAVENOUS

## 2020-01-12 MED ORDER — METOPROLOL TARTRATE 25 MG PO TABS: 12.5000 mg | ORAL_TABLET | Freq: Once | ORAL | Status: DC

## 2020-01-12 MED ORDER — BOOST / RESOURCE BREEZE PO LIQD CUSTOM
1.0000 | Freq: Three times a day (TID) | ORAL | Status: DC
  Administered 2020-01-12 – 2020-01-14 (×4): 1 via ORAL

## 2020-01-12 MED ORDER — SODIUM CHLORIDE 0.9 % IV BOLUS
500.0000 mL | Freq: Once | INTRAVENOUS | Status: AC
  Administered 2020-01-12: 01:00:00 500 mL via INTRAVENOUS

## 2020-01-12 MED ORDER — ALBUMIN HUMAN 25 % IV SOLN
25.0000 g | Freq: Once | INTRAVENOUS | Status: AC
  Administered 2020-01-12: 25 g via INTRAVENOUS
  Filled 2020-01-12: qty 100

## 2020-01-12 NOTE — Progress Notes (Signed)
HR 62, SR w/ BBB and BP is now 95/39.  Pt denies distress and is resting quietly in bed.

## 2020-01-12 NOTE — Progress Notes (Signed)
Patient Name: Julie Mcmillan Date of Encounter: 01/12/2020  Hospital Problem List     Principal Problem:   Gastroenteritis Active Problems:   Type 2 diabetes mellitus (HCC)   Anemia, iron deficiency   Adult hypothyroidism   Hypertension associated with diabetes ([place])   PAF (paroxysmal atrial fibrillation) ([place])   Interstitial lung disease ([place])    Patient Profile     79 y.o.femalewith history ofparoxysmal atrial fibrillation, pulmonary hypertension on tadalafil who presented to the emergency room with complaints of abdominal discomfort and increased diarrhea. She has had a problem with this in the past. She had been  followed at [facility] EP for her paroxysmal A. fib andwason sotalol 80 mg twice daily for this along with Eliquis for anticoagulation and diltiazem. She states she has breakthrough A. fib 3-4 times a month. She was admitted after presenting to the emergency room with abdominal discomfort and she had atrial fibrillation episodes with rates of 144.She was taken off of sotolol and converted to Multaq after washout. Continued with cardizem and metoprolol. On eliquis for anticoagulation. She continues to have intermittant breakthroughs. Relatively h ypotensive this am.   Subjective   Difficult night due to afib with rvr. Currently in nsr  Inpatient Medications    . apixaban  5 mg Oral BID  . busPIRone  7.5 mg Oral BID  . calcium carbonate  1 tablet Oral BID  . diltiazem  240 mg Oral Daily  . dronedarone  400 mg Oral BID WC  . famotidine  20 mg Oral Daily  . ferrous sulfate  325 mg Oral Q breakfast  . FLUoxetine  40 mg Oral Daily  . furosemide  20 mg Oral Daily  . levothyroxine  75 mcg Oral QAC breakfast  . metoprolol tartrate  25 mg Oral BID  . off the beat book   Does not apply Once  . predniSONE  10 mg Oral Q breakfast  . sodium chloride flush  3 mL Intravenous Q12H  . tadalafil (PAH)  40 mg Oral QHS  . traZODone  50 mg Oral QHS     Vital Signs    Vitals:   01/12/20 0752 01/12/20 0753 01/12/20 0754 01/12/20 0755  BP:  (!) 80/47    Pulse: (!) 55 (!) 49 (!) 47 (!) 54  Resp: _0 Temp:      TempSrc:      SpO2: 100% 100% 100% 99%  Weight:      Height:        Intake/Output Summary (Last 24 hours) at 01/12/2020 0807 Last data filed at 01/12/2020 0544 Gross per 24 hour  Intake --  Output 350 ml  Net -350 ml   Filed Weights   01/10/20 0430 01/11/20 0421 01/12/20 0443  Weight: 65 kg 64.5 kg 65.5 kg    Physical Exam    GEN: Well nourished, well developed, in no acute distress.  HEENT: normal.  Neck: Supple, no JVD, carotid bruits, or masses. Cardiac: RRR this am Respiratory:  Respirations regular and unlabored, clear to auscultation bilaterally. GI: Soft, nontender, nondistended, BS + x 4. MS: no deformity or atrophy. Skin: warm and dry, no rash. Neuro:  Strength and sensation are intact. Psych: Normal affect.  Labs    CBC Recent Labs    01/11/20 0330 01/12/20 0402  WBC 9.0 8.6  HGB 9.8* 9.3*  HCT 31.9* 29.9*  MCV 77.4* 78.5*  PLT 255 846   Basic Metabolic Panel Recent Labs  01/11/20 0330 01/12/20 0402  NA 136 138  K 5.1 4.3  CL 100 102  CO2 29 27  GLUCOSE 105* 119*  BUN 21 24*  CREATININE 1.21* 1.22*  CALCIUM 8.9 8.8*   Liver Function Tests No results for input(s): AST, ALT, ALKPHOS, BILITOT, PROT, ALBUMIN in the last 72 hours. No results for input(s): LIPASE, AMYLASE in the last 72 hours. Cardiac Enzymes No results for input(s): CKTOTAL, CKMB, CKMBINDEX, TROPONINI in the last 72 hours. BNP No results for input(s): BNP in the last 72 hours. D-Dimer No results for input(s): DDIMER in the last 72 hours. Hemoglobin A1C No results for input(s): HGBA1C in the last 72 hours. Fasting Lipid Panel No results for input(s): CHOL, HDL, LDLCALC, TRIG, CHOLHDL, LDLDIRECT in the last 72 hours. Thyroid Function Tests No results for input(s): TSH, T4TOTAL, T3FREE, THYROIDAB in  the last 72 hours.  Invalid input(s): FREET3  Telemetry    nsr with intermittant afib with rvr.  ECG       Radiology    CT ABDOMEN PELVIS W CONTRAST  Result Date: 12/29/2019 CLINICAL DATA:  Nausea, vomiting and diarrhea since last night, intermittent abdominal pain rated 4/10, history of type II diabetes mellitus, atrial fibrillation, diverticulitis, GERD, hypertension, irritable bowel syndrome, pulmonary hypertension, pulmonary fibrosis EXAM: CT ABDOMEN AND PELVIS WITH CONTRAST TECHNIQUE: Multidetector CT imaging of the abdomen and pelvis was performed using the standard protocol following bolus administration of intravenous contrast. Sagittal and coronal MPR images reconstructed from axial data set. CONTRAST:  18m OMNIPAQUE IOHEXOL 300 MG/ML SOLN IV. No oral contrast. COMPARISON:  09/04/2019 FINDINGS: Lower chest: Bibasilar pulmonary fibrosis unchanged. Hepatobiliary: Contracted gallbladder. Liver unremarkable Pancreas: Hypoechoic nodule at body of pancreas, 8 x 12 mm, grossly unchanged. No additional pancreatic abnormalities. Spleen: Several tiny nonspecific low-attenuation foci within spleen, nonspecific. Adrenals/Urinary Tract: Adrenal glands normal appearance. Cortical scarring and small cysts within both kidneys. No enhancing renal mass, hydronephrosis or urinary tract calcification. Bladder and ureters unremarkable. Stomach/Bowel: Low lying cecum in pelvis. Appendix surgically absent by history. Distal colonic diverticulosis without evidence of diverticulitis. Moderate-sized hiatal hernia. Vascular/Lymphatic: Scattered atherosclerotic calcifications aorta and iliac arteries without aneurysm. Additional calcified plaques at the origins of the celiac and superior mesenteric arteries. No adenopathy. Reproductive: Uterus surgically absent with nonvisualization of ovaries Other: No free air or free fluid. No acute inflammatory process or hernia identified. Musculoskeletal: Osseous  demineralization marked compression fracture of T8 vertebral body unchanged since another CT of 10/26/2016. IMPRESSION: Distal colonic diverticulosis without evidence of diverticulitis. Moderate-sized hiatal hernia. Stable cystic lesion at body of pancreas 8 x 12 x 14 mm. Stable bibasilar pulmonary fibrosis. Stable marked compression fracture T8 vertebral body. No acute intra-abdominal or intrapelvic abnormalities. Aortic Atherosclerosis (ICD10-I70.0). Electronically Signed   By: MLavonia DanaM.D.   On: 12/29/2019 13:09   DG Chest Port 1 View  Result Date: 01/06/2020 CLINICAL DATA:  Hypoxia. EXAM: PORTABLE CHEST 1 VIEW COMPARISON:  12/29/2019 FINDINGS: Heart size is normal. Chronic fibrotic lung disease peers similar to previous studies. No evidence of acute consolidation, collapse or effusion. Low level inflammatory changes could be hidden within the extensive chronic findings. No bone abnormality. IMPRESSION: Chronic fibrotic lung disease without visible change. Lower level inflammatory changes could be hidden within the extensive chronic findings. Electronically Signed   By: MNelson ChimesM.D.   On: 01/06/2020 12:23   DG Chest Portable 1 View  Result Date: 12/29/2019 CLINICAL DATA:  Pain with nausea and vomiting EXAM: PORTABLE CHEST 1 VIEW COMPARISON:  August 10, 2019 FINDINGS: There is extensive fibrosis throughout the lungs bilaterally. There is no frank edema or consolidation. Heart is upper normal in size with pulmonary vascularity normal. No adenopathy. No bone lesions. There is aortic atherosclerosis. IMPRESSION: Extensive fibrosis throughout the lungs, stable. No edema or airspace opacity. Stable cardiac silhouette. No adenopathy. Aortic Atherosclerosis (ICD10-I70.0). Electronically Signed   By: Lowella Grip III M.D.   On: 12/29/2019 13:31   ECHOCARDIOGRAM COMPLETE  Result Date: 01/03/2020    ECHOCARDIOGRAM REPORT   Patient Name:   Julie Mcmillan Date of Exam: 01/02/2020 Medical Rec #:   [id number]        Height:       64.0 in Accession #:    [id number]       Weight:       147.0 lb Date of Birth:  [birth year]         BSA:          1.716 m Patient Age:    15 years         BP:           144/61 mmHg Patient Gender: F                HR:           66 bpm. Exam Location:  [facility] Procedure: 2D Echo Indications:     Abnormal ECG 794.31/ R94.31  History:         Patient has prior history of Echocardiogram examinations, most                  recent 03/26/2015.  Sonographer:     Arville Go RDCS Referring Phys:  [id number] [place] Diagnosing Phys: Bartholome Bill MD IMPRESSIONS  1. Left ventricular ejection fraction, by estimation, is 60 to 65%. The left ventricle has normal function. The left ventricle has no regional wall motion abnormalities. There is mild asymmetric left ventricular hypertrophy of the basal-septal segment. Left ventricular diastolic parameters are consistent with Grade I diastolic dysfunction (impaired relaxation).  2. Right ventricular systolic function is normal. The right ventricular size is normal.  3. Left atrial size was mildly dilated.  4. Right atrial size was mildly dilated.  5. The mitral valve is grossly normal. Trivial mitral valve regurgitation.  6. The aortic valve is tricuspid. Aortic valve regurgitation is mild. No aortic stenosis is present. FINDINGS  Left Ventricle: Left ventricular ejection fraction, by estimation, is 60 to 65%. The left ventricle has normal function. The left ventricle has no regional wall motion abnormalities. The left ventricular internal cavity size was normal in size. There is  mild asymmetric left ventricular hypertrophy of the basal-septal segment. Left ventricular diastolic parameters are consistent with Grade I diastolic dysfunction (impaired relaxation). Right Ventricle: The right ventricular size is normal. No increase in right ventricular wall thickness. Right ventricular systolic function is normal. Left Atrium: Left atrial size was mildly  dilated. Right Atrium: Right atrial size was mildly dilated. Pericardium: There is no evidence of pericardial effusion. Mitral Valve: The mitral valve is grossly normal. Trivial mitral valve regurgitation. Tricuspid Valve: The tricuspid valve is normal in structure. Tricuspid valve regurgitation is mild. Aortic Valve: The aortic valve is tricuspid. Aortic valve regurgitation is mild. Aortic regurgitation PHT measures 639 msec. No aortic stenosis is present. Aortic valve peak gradient measures 11.2 mmHg. Pulmonic Valve: The pulmonic valve was grossly normal. Pulmonic valve regurgitation is trivial. Aorta: The aortic root is normal in size and structure. IAS/Shunts:  The interatrial septum was not assessed.  LEFT VENTRICLE PLAX 2D LVIDd:         4.31 cm  Diastology LVIDs:         2.89 cm  LV e' lateral:   4.68 cm/s LV PW:         1.32 cm  LV E/e' lateral: 19.5 LV IVS:        1.14 cm  LV e' medial:    4.46 cm/s LVOT diam:     1.90 cm  LV E/e' medial:  20.4 LV SV:         67 LV SV Index:   39 LVOT Area:     2.84 cm  RIGHT VENTRICLE RV Basal diam:  3.23 cm RV S prime:     16.60 cm/s TAPSE (M-mode): 2.2 cm LEFT ATRIUM             Index       RIGHT ATRIUM           Index LA diam:        3.90 cm 2.27 cm/m  RA Area:     12.80 cm LA Vol (A2C):   25.2 ml 14.68 ml/m RA Volume:   29.60 ml  17.25 ml/m LA Vol (A4C):   37.0 ml 21.56 ml/m LA Biplane Vol: 32.7 ml 19.05 ml/m  AORTIC VALVE                PULMONIC VALVE AV Area (Vmax): 1.90 cm    PV Vmax:       1.17 m/s AV Vmax:        167.00 cm/s PV Peak grad:  5.5 mmHg AV Peak Grad:   11.2 mmHg LVOT Vmax:      112.00 cm/s LVOT Vmean:     64.500 cm/s LVOT VTI:       0.235 m AI PHT:         639 msec  AORTA Ao Root diam: 2.90 cm Ao Asc diam:  2.70 cm MITRAL VALVE                TRICUSPID VALVE MV Area (PHT): 3.42 cm     TV Peak grad:   55.2 mmHg MV Decel Time: 222 msec     TV Vmax:        3.72 m/s MV E velocity: 91.20 cm/s MV A velocity: 127.00 cm/s  SHUNTS MV E/A ratio:  0.72          Systemic VTI:  0.24 m                             Systemic Diam: 1.90 cm Bartholome Bill MD Electronically signed by Bartholome Bill MD Signature Date/Time: 01/03/2020/9:14:55 AM    Final     Assessment & Plan    1.Atrial fib/flutter-discussed with electrophysiology. Has switched toMultaq 400 mg twice daily. Still having episodes of breakthrough.  Consideration for amiodarone could be raised as well however interstitial lung disease would make this less ideal. .Would continue on Multaq 400 bid, cardizemCD 240 mg dialy, and low dose metoprolol following blood pressure and rhythm/rate. apixaban 5 bid.  2. Pulmonary fibrosiis-continue with tadalafil 40 mg q hs.   Signed, Javier Docker Jenyfer Trawick MD 01/12/2020, 8:07 AM  Pager: [phone]

## 2020-01-12 NOTE — Progress Notes (Signed)
Patietn with a fib RVR and hypotension - orthostasis present -heart rate improved after 500 cc bolus, remains blorder low BP, additional 500 cc bolus

## 2020-01-12 NOTE — Progress Notes (Signed)
Pt's  Heart rate/rythm waas sustaining afib rvr 130's with a low bp at 77/49.  500 cc IVF bolus given per NP on call.  BP now in 51'T systolically and heart rate/rythm is SB with BBB.

## 2020-01-12 NOTE — Progress Notes (Signed)
Initial Nutrition Assessment  DOCUMENTATION CODES:   Not applicable  INTERVENTION:   Boost Breeze po TID, each supplement provides 250 kcal and 9 grams of protein  MVI daily   Liberalize diet   NUTRITION DIAGNOSIS:   Inadequate oral intake related to acute illness as evidenced by per patient/family report.  GOAL:   Patient will meet greater than or equal to 90% of their needs  MONITOR:   PO intake, Supplement acceptance, Labs, Weight trends, Skin, I & O's  REASON FOR ASSESSMENT:   LOS    ASSESSMENT:   79 y.o. female with medical history significant for interstitial lung disease/pulmonary fibrosis, chronic respiratory failure with hypoxia on 4 L supplemental O2 via [place], paroxysmal atrial fibrillation on Eliquis, pulmonary hypertension, diet-controlled type 2 diabetes, hypertension, hypothyroidism, depression/anxiety who presents to the ED for evaluation of persistent nausea, vomiting, and diarrhea.  RD working remotely.  Spoke with pt via phone. Pt reports fairly good appetite and oral intake in hospital; pt reports that she is doing the best she can without getting any salt. Pt is documented to be eating anywhere from 25-80% of meals. Pt reports that she is unable to tolerate "milky" supplements as she they cause her to vomit. Pt is willing to try Boost Breeze. RD will add supplements and MVI to help pt meet her estimated needs. RD will also liberalize the heart healthy portion of pt's diet as this is restrictive of protein. Per chart, pt appears fairly weight stable pta and since admit.     Medications reviewed and include: pepcid, ferrous sulfate, lasix, synthroid, prednisone  Labs reviewed: BUN 24(H), creat 1.22(H) Hgb 9.3(L), Hct 29.9(L), MCH 24.4(L)  NUTRITION - FOCUSED PHYSICAL EXAM: Unable to perform at this time   Diet Order:   Diet Order            Diet - low sodium heart healthy        Diet heart healthy/carb modified Room service appropriate? Yes; Fluid  consistency: Thin  Diet effective now             EDUCATION NEEDS:   No education needs have been identified at this time  Skin:  Skin Assessment: Reviewed RN Assessment(ecchymosis)  Last BM:  4/13- type 6  Height:   Ht Readings from Last 1 Encounters:  12/30/19 5' 4" (1.626 m)    Weight:   Wt Readings from Last 1 Encounters:  01/12/20 65.5 kg    Ideal Body Weight:  54.5 kg  BMI:  Body mass index is 24.77 kg/m.  Estimated Nutritional Needs:   Kcal:  1500-1700kcal/day  Protein:  75-85g/day  Fluid:  >1.4L/day  Koleen Distance MS, RD, LDN Please refer to [facility] for RD and/or RD on-call/weekend/after hours pager

## 2020-01-12 NOTE — Progress Notes (Signed)
500cc IVF bolus started at this time for soft BP 90/24

## 2020-01-12 NOTE — Progress Notes (Signed)
PT Cancellation Note  Patient Details Name: Julie Mcmillan MRN: [id number] DOB: 1941-01-19   Cancelled Treatment:    Reason Eval/Treat Not Completed: Other (comment). Treatment attempted this morning, however pt reports she was up all night long and wishes to rest today. She is adamant about refusing therapy today. Will re-attempt another date.   Oree Mirelez 01/12/2020, 8:59 AM  Greggory Stallion, PT, DPT [phone]

## 2020-01-12 NOTE — Progress Notes (Signed)
PROGRESS NOTE    Julie Mcmillan  KGM:[id number] DOB: 02-09-1941 DOA: 12/30/2019 PCP: Kirk Ruths, MD       Assessment & Plan:   Principal Problem:   Gastroenteritis Active Problems:   Type 2 diabetes mellitus ([place])   Anemia, iron deficiency   Adult hypothyroidism   Hypertension associated with diabetes ([place])   PAF (paroxysmal atrial fibrillation) (HCC)   Interstitial lung disease (HCC)   Paroxysmal atrial fibrillation: with intermittent RVR. Continue on cardizem. IV cardizem prn for RVR.Put on IV cardizem drip 01/06/20 evening for RVR but pt converted to sinus rhythm so this was d/c.Withnonsustained VT intermittently. Went into a. fib w/ RVR overnight as well hypotensive & pt was given 2 IVF boluses. Cardio added metoprolol 01/11/20.Cardiology spoke to [place], will continue on Multaq 400 mg twice daily. If fails on Multaq, will need ablation.May have some bradycardia w/ BB & CCB use.   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 Continue prednisone 10 mg daily. Holding azathioprine for now as above, can resume on discharge. Continue supplemental oxygen, maintaining well on home 4 L [place]  Gastroenteritis: Resolved. CT abdomen/pelvis with contrast on 12/29/2019 without acute etiology. C. difficile, GI pathogen panel, SARS-CoV-2 PCR-so far negative. Continue to hold azathioprine, colestipol, bactrim for now. 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 Transfused iv iron x2 so far. Will need to follow-up with PCP for outpatient work-up.Pt verbalized their understanding  AKI vs CKD: baseline Cr/GFR is unknown. Cr is labile. Will continue to monitor   Pulmonary hypertension: continue tadalafil  Hypertension:continue diltiazem, metoprolol   Hypothyroidism: continue synthroid.  DM2: diet controlled. Will continue to monitor   Depression/anxiety: severity  unknown. Continue fluoxetine and BuSpar.  DVT prophylaxis: eliquis Code Status: full  Family Communication:  Disposition Plan: was d/c on 01/10/20 but pt's daughter appealed d/c. Overnight pt had another episode of a.fib w/ RVR & hypotension and cardio is aware. Will continue to monitor on current meds & will reassess tomorrow as how to proceed from there pending cardio recs. PT/OT previously recs home health which has already been set up. Pt's family is trying to get the pt into ALF   Consultants:   cardio   Procedures:    Antimicrobials:    Subjective: Pt c/o weakness  Objective: Vitals:   01/11/20 2118 01/12/20 0400 01/12/20 0443 01/12/20 0504  BP:   (!) 91/50 (!) 109/35  Pulse: (!) 130 (!) 59 (!) 56   Resp:      Temp:   97.7 F (36.5 C)   TempSrc:   Oral   SpO2:   100%   Weight:   65.5 kg   Height:        Intake/Output Summary (Last 24 hours) at 01/12/2020 0739 Last data filed at 01/12/2020 0544 Gross per 24 hour  Intake --  Output 350 ml  Net -350 ml   Filed Weights   01/10/20 0430 01/11/20 0421 01/12/20 0443  Weight: 65 kg 64.5 kg 65.5 kg    Examination:  General exam: Appears calm and comfortable  Respiratory system: Course breath sounds b/l. Rhonchi b/l Cardiovascular system: S1/S2+. No  rubs, gallops or clicks.  Gastrointestinal system: Abdomen is nondistended, soft and nontender. normal bowel sounds heard. Central nervous system: Alert and oriented. Moves all 4 extremities Psychiatry: Judgement and insight appear normal. Flat mood and affect      Data Reviewed: I have personally reviewed following labs and  imaging studies  CBC: Recent Labs  Lab 01/08/20 0644 01/09/20 0628 01/10/20 0622 01/11/20 0330 01/12/20 0402  WBC 8.9 8.3 8.7 9.0 8.6  HGB 8.9* 9.2* 9.7* 9.8* 9.3*  HCT 29.5* 30.1* 31.1* 31.9* 29.9*  MCV 75.3* 76.8* 76.6* 77.4* 78.5*  PLT 276 268 249 255 838   Basic Metabolic Panel: Recent Labs  Lab 01/08/20 0644 01/09/20 0628  01/10/20 0622 01/11/20 0330 01/12/20 0402  NA 135 138 137 136 138  K 4.4 4.3 4.1 5.1 4.3  CL 99 100 98 100 102  CO2 _0 GLUCOSE 98 101* 102* 105* 119*  BUN _1 24*  CREATININE 0.99 1.09* 1.05* 1.21* 1.22*  CALCIUM 8.7* 8.9 8.8* 8.9 8.8*   GFR: Estimated Creatinine Clearance: 31.8 mL/min (A) (by C-G formula based on SCr of 1.22 mg/dL (H)). Liver Function Tests: No results for input(s): AST, ALT, ALKPHOS, BILITOT, PROT, ALBUMIN in the last 168 hours. No results for input(s): LIPASE, AMYLASE in the last 168 hours. No results for input(s): AMMONIA in the last 168 hours. Coagulation Profile: No results for input(s): INR, PROTIME in the last 168 hours. Cardiac Enzymes: No results for input(s): CKTOTAL, CKMB, CKMBINDEX, TROPONINI in the last 168 hours. BNP (last 3 results) No results for input(s): PROBNP in the last 8760 hours. HbA1C: No results for input(s): HGBA1C in the last 72 hours. CBG: No results for input(s): GLUCAP in the last 168 hours. Lipid Profile: No results for input(s): CHOL, HDL, LDLCALC, TRIG, CHOLHDL, LDLDIRECT in the last 72 hours. Thyroid Function Tests: No results for input(s): TSH, T4TOTAL, FREET4, T3FREE, THYROIDAB in the last 72 hours. Anemia Panel: No results for input(s): VITAMINB12, FOLATE, FERRITIN, TIBC, IRON, RETICCTPCT in the last 72 hours. Sepsis Labs: No results for input(s): PROCALCITON, LATICACIDVEN in the last 168 hours.  No results found for this or any previous visit (from the past 240 hour(s)).       Radiology Studies: No results found.      Scheduled Meds: . apixaban  5 mg Oral BID  . busPIRone  7.5 mg Oral BID  . calcium carbonate  1 tablet Oral BID  . diltiazem  240 mg Oral Daily  . dronedarone  400 mg Oral BID WC  . famotidine  20 mg Oral Daily  . ferrous sulfate  325 mg Oral Q breakfast  . FLUoxetine  40 mg Oral Daily  . furosemide  20 mg Oral Daily  . levothyroxine  75 mcg Oral QAC breakfast  .  metoprolol tartrate  25 mg Oral BID  . off the beat book   Does not apply Once  . predniSONE  10 mg Oral Q breakfast  . sodium chloride flush  3 mL Intravenous Q12H  . tadalafil (PAH)  40 mg Oral QHS  . traZODone  50 mg Oral QHS   Continuous Infusions: . sodium chloride 500 mL (01/12/20 0640)     LOS: 12 days    Time spent: 32 mins    Wyvonnia Dusky, MD [facility] Pager [phone]  If 7PM-7AM, please contact night-coverage [email or web address] 01/12/2020, 7:39 AM

## 2020-01-13 DIAGNOSIS — K529 Noninfective gastroenteritis and colitis, unspecified: Secondary | ICD-10-CM | POA: Diagnosis not present

## 2020-01-13 DIAGNOSIS — D509 Iron deficiency anemia, unspecified: Secondary | ICD-10-CM | POA: Diagnosis not present

## 2020-01-13 DIAGNOSIS — J849 Interstitial pulmonary disease, unspecified: Secondary | ICD-10-CM

## 2020-01-13 DIAGNOSIS — I480 Paroxysmal atrial fibrillation: Secondary | ICD-10-CM

## 2020-01-13 LAB — BASIC METABOLIC PANEL
Anion gap: 8 (ref 5–15)
BUN: 23 mg/dL (ref 8–23)
CO2: 30 mmol/L (ref 22–32)
Calcium: 8.6 mg/dL — ABNORMAL LOW (ref 8.9–10.3)
Chloride: 102 mmol/L (ref 98–111)
Creatinine, Ser: 0.97 mg/dL (ref 0.44–1.00)
GFR calc Af Amer: >60 mL/min (ref >60)
GFR calc non Af Amer: 55 mL/min — ABNORMAL LOW (ref >60)
Glucose, Bld: 109 mg/dL — ABNORMAL HIGH (ref 70–99)
Potassium: 4.1 mmol/L (ref 3.5–5.1)
Sodium: 140 mmol/L (ref 135–145)

## 2020-01-13 LAB — CBC
HCT: 29.2 % — ABNORMAL LOW (ref 36.0–46.0)
Hemoglobin: 8.7 g/dL — ABNORMAL LOW (ref 12.0–15.0)
MCH: 24.5 pg — ABNORMAL LOW (ref 26.0–34.0)
MCHC: 29.8 g/dL — ABNORMAL LOW (ref 30.0–36.0)
MCV: 82.3 fL (ref 80.0–100.0)
Platelets: 224 10*3/uL (ref 150–400)
RBC: 3.55 MIL/uL — ABNORMAL LOW (ref 3.87–5.11)
RDW: 27.9 % — ABNORMAL HIGH (ref 11.5–15.5)
WBC: 6.9 10*3/uL (ref 4.0–10.5)
nRBC: 0 % (ref 0.0–0.2)

## 2020-01-13 NOTE — Progress Notes (Signed)
PT Cancellation Note  Patient Details Name: JONEA BUKOWSKI MRN: [id number] DOB: 1941/05/16   Cancelled Treatment:    Reason Eval/Treat Not Completed: Other (comment). Treatment attempted this date. Pt refuses reporting she has been nauseated all day and hasn't felt like eating. Pt sleeping in bed upon arrival, awakens easily. Pt reports she has been mobile with RN staff including going to bathroom and sitting in recliner. Decreased physical activity level the past few days. Educated on benefits of mobility. Will re-attempt 1 more time tomorrow if possible otherwise will have to discontinue services due to multiple refusals.    Maliya Marich 01/13/2020, 2:42 PM Greggory Stallion, PT, DPT [phone]

## 2020-01-13 NOTE — Progress Notes (Addendum)
MD notified patient converted to atrial flutter at 1800. Cardiology made aware as well. Heart rate in the 80s. No complaints by patient as this time. Per cardiology, no changes at this time. Will continue to monitor.

## 2020-01-13 NOTE — Progress Notes (Signed)
Patient Name: MASAYO FERA Date of Encounter: 01/13/2020  Hospital Problem List     Principal Problem:   Gastroenteritis Active Problems:   Type 2 diabetes mellitus (HCC)   Anemia, iron deficiency   Adult hypothyroidism   Hypertension associated with diabetes ([place])   PAF (paroxysmal atrial fibrillation) ([place])   Interstitial lung disease ([place])    Patient Profile     79 y.o.femalewith history ofparoxysmal atrial fibrillation, pulmonary hypertension on tadalafil who presented to the emergency room with complaints of abdominal discomfort and increased diarrhea.   Subjective  Still with break through aflutter with intermittent nsr.   Inpatient Medications    . apixaban  5 mg Oral BID  . busPIRone  7.5 mg Oral BID  . calcium carbonate  1 tablet Oral BID  . diltiazem  240 mg Oral Daily  . dronedarone  400 mg Oral BID WC  . famotidine  20 mg Oral Daily  . feeding supplement  1 Container Oral TID BM  . ferrous sulfate  325 mg Oral Q breakfast  . FLUoxetine  40 mg Oral Daily  . furosemide  20 mg Oral Daily  . levothyroxine  75 mcg Oral QAC breakfast  . metoprolol tartrate  25 mg Oral BID  . multivitamin with minerals  1 tablet Oral Daily  . off the beat book   Does not apply Once  . predniSONE  10 mg Oral Q breakfast  . sodium chloride flush  3 mL Intravenous Q12H  . tadalafil (PAH)  40 mg Oral QHS  . traZODone  50 mg Oral QHS    Vital Signs    Vitals:   01/13/20 0446 01/13/20 0606 01/13/20 0814 01/13/20 1141  BP: (!) 115/56 (!) 114/41 (!) 105/43 (!) 104/53  Pulse: (!) 58 (!) 50 (!) 53 62  Resp:   16 16  Temp: 98.4 F (36.9 C)  97.8 F (36.6 C) 98.1 F (36.7 C)  TempSrc: Oral  Oral   SpO2:   97% 100%  Weight: 68.4 kg     Height:        Intake/Output Summary (Last 24 hours) at 01/13/2020 1348 Last data filed at 01/13/2020 1024 Gross per 24 hour  Intake 460 ml  Output 0 ml  Net 460 ml   Filed Weights   01/11/20 0421 01/12/20 0443 01/13/20 0446   Weight: 64.5 kg 65.5 kg 68.4 kg    Physical Exam    GEN: Well nourished, well developed, in no acute distress.  HEENT: normal.  Neck: Supple, no JVD, carotid bruits, or masses. Cardiac: regular rhythm at present.   Respiratory:  Respirations regular and unlabored, clear to auscultation bilaterally. GI: Soft, nontender, nondistended, BS + x 4. MS: no deformity or atrophy. Skin: warm and dry, no rash. Neuro:  Strength and sensation are intact. Psych: Normal affect.  Labs    CBC Recent Labs    01/12/20 0402 01/13/20 0601  WBC 8.6 6.9  HGB 9.3* 8.7*  HCT 29.9* 29.2*  MCV 78.5* 82.3  PLT 259 837   Basic Metabolic Panel Recent Labs    01/12/20 0402 01/13/20 0601  NA 138 140  K 4.3 4.1  CL 102 102  CO2 27 30  GLUCOSE 119* 109*  BUN 24* 23  CREATININE 1.22* 0.97  CALCIUM 8.8* 8.6*   Liver Function Tests No results for input(s): AST, ALT, ALKPHOS, BILITOT, PROT, ALBUMIN in the last 72 hours. No results for input(s): LIPASE, AMYLASE in the last 72 hours. Cardiac  Enzymes No results for input(s): CKTOTAL, CKMB, CKMBINDEX, TROPONINI in the last 72 hours. BNP No results for input(s): BNP in the last 72 hours. D-Dimer No results for input(s): DDIMER in the last 72 hours. Hemoglobin A1C No results for input(s): HGBA1C in the last 72 hours. Fasting Lipid Panel No results for input(s): CHOL, HDL, LDLCALC, TRIG, CHOLHDL, LDLDIRECT in the last 72 hours. Thyroid Function Tests No results for input(s): TSH, T4TOTAL, T3FREE, THYROIDAB in the last 72 hours.  Invalid input(s): FREET3  Telemetry    nsr with intermittant atrial flutter with occasional rvr.   ECG       Radiology    CT ABDOMEN PELVIS W CONTRAST  Result Date: 12/29/2019 CLINICAL DATA:  Nausea, vomiting and diarrhea since last night, intermittent abdominal pain rated 4/10, history of type II diabetes mellitus, atrial fibrillation, diverticulitis, GERD, hypertension, irritable bowel syndrome, pulmonary  hypertension, pulmonary fibrosis EXAM: CT ABDOMEN AND PELVIS WITH CONTRAST TECHNIQUE: Multidetector CT imaging of the abdomen and pelvis was performed using the standard protocol following bolus administration of intravenous contrast. Sagittal and coronal MPR images reconstructed from axial data set. CONTRAST:  176m OMNIPAQUE IOHEXOL 300 MG/ML SOLN IV. No oral contrast. COMPARISON:  09/04/2019 FINDINGS: Lower chest: Bibasilar pulmonary fibrosis unchanged. Hepatobiliary: Contracted gallbladder. Liver unremarkable Pancreas: Hypoechoic nodule at body of pancreas, 8 x 12 mm, grossly unchanged. No additional pancreatic abnormalities. Spleen: Several tiny nonspecific low-attenuation foci within spleen, nonspecific. Adrenals/Urinary Tract: Adrenal glands normal appearance. Cortical scarring and small cysts within both kidneys. No enhancing renal mass, hydronephrosis or urinary tract calcification. Bladder and ureters unremarkable. Stomach/Bowel: Low lying cecum in pelvis. Appendix surgically absent by history. Distal colonic diverticulosis without evidence of diverticulitis. Moderate-sized hiatal hernia. Vascular/Lymphatic: Scattered atherosclerotic calcifications aorta and iliac arteries without aneurysm. Additional calcified plaques at the origins of the celiac and superior mesenteric arteries. No adenopathy. Reproductive: Uterus surgically absent with nonvisualization of ovaries Other: No free air or free fluid. No acute inflammatory process or hernia identified. Musculoskeletal: Osseous demineralization marked compression fracture of T8 vertebral body unchanged since another CT of 10/26/2016. IMPRESSION: Distal colonic diverticulosis without evidence of diverticulitis. Moderate-sized hiatal hernia. Stable cystic lesion at body of pancreas 8 x 12 x 14 mm. Stable bibasilar pulmonary fibrosis. Stable marked compression fracture T8 vertebral body. No acute intra-abdominal or intrapelvic abnormalities. Aortic  Atherosclerosis (ICD10-I70.0). Electronically Signed   By: MLavonia DanaM.D.   On: 12/29/2019 13:09   DG Chest Port 1 View  Result Date: 01/06/2020 CLINICAL DATA:  Hypoxia. EXAM: PORTABLE CHEST 1 VIEW COMPARISON:  12/29/2019 FINDINGS: Heart size is normal. Chronic fibrotic lung disease peers similar to previous studies. No evidence of acute consolidation, collapse or effusion. Low level inflammatory changes could be hidden within the extensive chronic findings. No bone abnormality. IMPRESSION: Chronic fibrotic lung disease without visible change. Lower level inflammatory changes could be hidden within the extensive chronic findings. Electronically Signed   By: MNelson ChimesM.D.   On: 01/06/2020 12:23   DG Chest Portable 1 View  Result Date: 12/29/2019 CLINICAL DATA:  Pain with nausea and vomiting EXAM: PORTABLE CHEST 1 VIEW COMPARISON:  August 10, 2019 FINDINGS: There is extensive fibrosis throughout the lungs bilaterally. There is no frank edema or consolidation. Heart is upper normal in size with pulmonary vascularity normal. No adenopathy. No bone lesions. There is aortic atherosclerosis. IMPRESSION: Extensive fibrosis throughout the lungs, stable. No edema or airspace opacity. Stable cardiac silhouette. No adenopathy. Aortic Atherosclerosis (ICD10-I70.0). Electronically Signed   By: WGwyndolyn Saxon  Jasmine December III M.D.   On: 12/29/2019 13:31   ECHOCARDIOGRAM COMPLETE  Result Date: 01/03/2020    ECHOCARDIOGRAM REPORT   Patient Name:   NANNIE STARZYK Date of Exam: 01/02/2020 Medical Rec #:  [id number]        Height:       64.0 in Accession #:    [id number]       Weight:       147.0 lb Date of Birth:  Aug 16, 1941         BSA:          1.716 m Patient Age:    64 years         BP:           144/61 mmHg Patient Gender: F                HR:           66 bpm. Exam Location:  [facility] Procedure: 2D Echo Indications:     Abnormal ECG 794.31/ R94.31  History:         Patient has prior history of Echocardiogram examinations, most                   recent 03/26/2015.  Sonographer:     Arville Go RDCS Referring Phys:  [id number] [place] Diagnosing Phys: Bartholome Bill MD IMPRESSIONS  1. Left ventricular ejection fraction, by estimation, is 60 to 65%. The left ventricle has normal function. The left ventricle has no regional wall motion abnormalities. There is mild asymmetric left ventricular hypertrophy of the basal-septal segment. Left ventricular diastolic parameters are consistent with Grade I diastolic dysfunction (impaired relaxation).  2. Right ventricular systolic function is normal. The right ventricular size is normal.  3. Left atrial size was mildly dilated.  4. Right atrial size was mildly dilated.  5. The mitral valve is grossly normal. Trivial mitral valve regurgitation.  6. The aortic valve is tricuspid. Aortic valve regurgitation is mild. No aortic stenosis is present. FINDINGS  Left Ventricle: Left ventricular ejection fraction, by estimation, is 60 to 65%. The left ventricle has normal function. The left ventricle has no regional wall motion abnormalities. The left ventricular internal cavity size was normal in size. There is  mild asymmetric left ventricular hypertrophy of the basal-septal segment. Left ventricular diastolic parameters are consistent with Grade I diastolic dysfunction (impaired relaxation). Right Ventricle: The right ventricular size is normal. No increase in right ventricular wall thickness. Right ventricular systolic function is normal. Left Atrium: Left atrial size was mildly dilated. Right Atrium: Right atrial size was mildly dilated. Pericardium: There is no evidence of pericardial effusion. Mitral Valve: The mitral valve is grossly normal. Trivial mitral valve regurgitation. Tricuspid Valve: The tricuspid valve is normal in structure. Tricuspid valve regurgitation is mild. Aortic Valve: The aortic valve is tricuspid. Aortic valve regurgitation is mild. Aortic regurgitation PHT measures 639 msec. No  aortic stenosis is present. Aortic valve peak gradient measures 11.2 mmHg. Pulmonic Valve: The pulmonic valve was grossly normal. Pulmonic valve regurgitation is trivial. Aorta: The aortic root is normal in size and structure. IAS/Shunts: The interatrial septum was not assessed.  LEFT VENTRICLE PLAX 2D LVIDd:         4.31 cm  Diastology LVIDs:         2.89 cm  LV e' lateral:   4.68 cm/s LV PW:         1.32 cm  LV E/e' lateral: 19.5 LV IVS:  1.14 cm  LV e' medial:    4.46 cm/s LVOT diam:     1.90 cm  LV E/e' medial:  20.4 LV SV:         67 LV SV Index:   39 LVOT Area:     2.84 cm  RIGHT VENTRICLE RV Basal diam:  3.23 cm RV S prime:     16.60 cm/s TAPSE (M-mode): 2.2 cm LEFT ATRIUM             Index       RIGHT ATRIUM           Index LA diam:        3.90 cm 2.27 cm/m  RA Area:     12.80 cm LA Vol (A2C):   25.2 ml 14.68 ml/m RA Volume:   29.60 ml  17.25 ml/m LA Vol (A4C):   37.0 ml 21.56 ml/m LA Biplane Vol: 32.7 ml 19.05 ml/m  AORTIC VALVE                PULMONIC VALVE AV Area (Vmax): 1.90 cm    PV Vmax:       1.17 m/s AV Vmax:        167.00 cm/s PV Peak grad:  5.5 mmHg AV Peak Grad:   11.2 mmHg LVOT Vmax:      112.00 cm/s LVOT Vmean:     64.500 cm/s LVOT VTI:       0.235 m AI PHT:         639 msec  AORTA Ao Root diam: 2.90 cm Ao Asc diam:  2.70 cm MITRAL VALVE                TRICUSPID VALVE MV Area (PHT): 3.42 cm     TV Peak grad:   55.2 mmHg MV Decel Time: 222 msec     TV Vmax:        3.72 m/s MV E velocity: 91.20 cm/s MV A velocity: 127.00 cm/s  SHUNTS MV E/A ratio:  0.72         Systemic VTI:  0.24 m                             Systemic Diam: 1.90 cm Bartholome Bill MD Electronically signed by Bartholome Bill MD Signature Date/Time: 01/03/2020/9:14:55 AM    Final     Assessment & Plan    79 y.o.femalewith history ofparoxysmal atrial fibrillation, pulmonary hypertension on tadalafil who presented to the emergency room with complaints of abdominal discomfort and increased diarrhea.   1.Atrial  fib/flutter-discussed with electrophysiology. Has switched toMultaq 400 mg twice daily. Still having episodes of breakthrough with flutter. Currently in nsr.Marland Kitchen Marland KitchenWould continue on Multaq 400 bid, cardizemCD 240 mg dialy, and low dose metoprolol following blood pressure and rhythm/rate. apixaban 5 bid.  2. Pulmonary fibrosiis-continue with tadalafil 40 mg q hs.  Signed, Javier Docker Ophie Burrowes MD 01/13/2020, 1:48 PM  Pager: [phone]

## 2020-01-13 NOTE — Plan of Care (Signed)
  Problem: Education: Goal: Knowledge of General Education information will improve Description: Including pain rating scale, medication(s)/side effects and non-pharmacologic comfort measures Outcome: Progressing

## 2020-01-13 NOTE — Progress Notes (Signed)
PROGRESS NOTE    Julie Mcmillan   TGY:[id number]  DOB: Jul 31, 1941  DOA: 12/30/2019 PCP: Kirk Ruths, MD   Brief Narrative:  Julie Mcmillan  is a 79 y.o. female with medical history significant for interstitial lung disease/pulmonary fibrosis, chronic respiratory failure with hypoxia on 4 L supplemental O2 via [place], paroxysmal atrial fibrillation on Eliquis, pulmonary hypertension, diet-controlled type 2 diabetes, hypertension, hypothyroidism, depression/anxiety who presents to the ED for evaluation of persistent nausea, vomiting, and diarrhea.  She was treated for gastroenteritis. Her GI issues have resolved but she has been having A-fib with RVR and cardiology was consulted to help manage this.    Subjective: She was nauseated this AM And required medication. She did not eat due to nausea.     Assessment & Plan:   Principal Problem:   Gastroenteritis - mostly resolved- I suspect that she may now have an exacerbation of underlying GERD- she is quite nauseated- PRN antiemetics being given- cont Pepcid  Active Problems:   PAF (paroxysmal atrial fibrillation) ([place]) - cardiology assisting with management- Multaq started but still having some bursts of A flutter - on Lopressor and Apixaban as well  Iron deficiency - cont Iron supplements     Interstitial lung disease (HCC) - cont Tadalafil and Prednisone  Time spent in minutes: 35 DVT prophylaxis: Apixaban Code Status: Full  Family Communication: son Disposition Plan: home once HR better controlled (maybe tomorrow) Consultants:   Cardiology Procedures:   none Antimicrobials:  Anti-infectives (From admission, onward)   None       Objective: Vitals:   01/13/20 0606 01/13/20 0814 01/13/20 1141 01/13/20 1552  BP: (!) 114/41 (!) 105/43 (!) 104/53 (!) 101/49  Pulse: (!) 50 (!) 53 62 (!) 54  Resp:  _0 Temp:  97.8 F (36.6 C) 98.1 F (36.7 C) 98.1 F (36.7 C)  TempSrc:  Oral    SpO2:  97% 100%  100%  Weight:      Height:        Intake/Output Summary (Last 24 hours) at 01/13/2020 1750 Last data filed at 01/13/2020 1024 Gross per 24 hour  Intake 460 ml  Output 0 ml  Net 460 ml   Filed Weights   01/11/20 0421 01/12/20 0443 01/13/20 0446  Weight: 64.5 kg 65.5 kg 68.4 kg    Examination: General exam: Appears comfortable  HEENT: PERRLA, oral mucosa moist, no sclera icterus or thrush Respiratory system: Clear to auscultation. Respiratory effort normal. Cardiovascular system: S1 & S2 heard, RRR.   Gastrointestinal system: Abdomen soft, non-tender, nondistended. Normal bowel sounds. Central nervous system: Alert and oriented. No focal neurological deficits. Extremities: No cyanosis, clubbing or edema Skin: No rashes or ulcers Psychiatry:  Mood & affect appropriate.     Data Reviewed: I have personally reviewed following labs and imaging studies  CBC: Recent Labs  Lab 01/09/20 0628 01/10/20 0622 01/11/20 0330 01/12/20 0402 01/13/20 0601  WBC 8.3 8.7 9.0 8.6 6.9  HGB 9.2* 9.7* 9.8* 9.3* 8.7*  HCT 30.1* 31.1* 31.9* 29.9* 29.2*  MCV 76.8* 76.6* 77.4* 78.5* 82.3  PLT 268 249 255 259 287   Basic Metabolic Panel: Recent Labs  Lab 01/09/20 0628 01/10/20 0622 01/11/20 0330 01/12/20 0402 01/13/20 0601  NA 138 137 136 138 140  K 4.3 4.1 5.1 4.3 4.1  CL 100 98 100 102 102  CO2 _1 GLUCOSE 101* 102* 105* 119* 109*  BUN _2 24* 23  CREATININE  1.09* 1.05* 1.21* 1.22* 0.97  CALCIUM 8.9 8.8* 8.9 8.8* 8.6*   GFR: Estimated Creatinine Clearance: 44 mL/min (by C-G formula based on SCr of 0.97 mg/dL). Liver Function Tests: No results for input(s): AST, ALT, ALKPHOS, BILITOT, PROT, ALBUMIN in the last 168 hours. No results for input(s): LIPASE, AMYLASE in the last 168 hours. No results for input(s): AMMONIA in the last 168 hours. Coagulation Profile: No results for input(s): INR, PROTIME in the last 168 hours. Cardiac Enzymes: No results for input(s):  CKTOTAL, CKMB, CKMBINDEX, TROPONINI in the last 168 hours. BNP (last 3 results) No results for input(s): PROBNP in the last 8760 hours. HbA1C: No results for input(s): HGBA1C in the last 72 hours. CBG: No results for input(s): GLUCAP in the last 168 hours. Lipid Profile: No results for input(s): CHOL, HDL, LDLCALC, TRIG, CHOLHDL, LDLDIRECT in the last 72 hours. Thyroid Function Tests: No results for input(s): TSH, T4TOTAL, FREET4, T3FREE, THYROIDAB in the last 72 hours. Anemia Panel: No results for input(s): VITAMINB12, FOLATE, FERRITIN, TIBC, IRON, RETICCTPCT in the last 72 hours. Urine analysis:    Component Value Date/Time   COLORURINE YELLOW (A) 12/31/2019 0029   APPEARANCEUR HAZY (A) 12/31/2019 0029   APPEARANCEUR Clear 11/04/2018 1531   LABSPEC 1.020 12/31/2019 0029   PHURINE 5.0 12/31/2019 0029   GLUCOSEU NEGATIVE 12/31/2019 0029   HGBUR NEGATIVE 12/31/2019 0029   BILIRUBINUR NEGATIVE 12/31/2019 0029   BILIRUBINUR Negative 11/04/2018 1531   KETONESUR NEGATIVE 12/31/2019 0029   PROTEINUR 30 (A) 12/31/2019 0029   NITRITE NEGATIVE 12/31/2019 0029   LEUKOCYTESUR MODERATE (A) 12/31/2019 0029   Sepsis Labs: _0 (procalcitonin:4,lacticidven:4) )No results found for this or any previous visit (from the past 240 hour(s)).       Radiology Studies: No results found.    Scheduled Meds: . apixaban  5 mg Oral BID  . busPIRone  7.5 mg Oral BID  . calcium carbonate  1 tablet Oral BID  . diltiazem  240 mg Oral Daily  . dronedarone  400 mg Oral BID WC  . famotidine  20 mg Oral Daily  . feeding supplement  1 Container Oral TID BM  . ferrous sulfate  325 mg Oral Q breakfast  . FLUoxetine  40 mg Oral Daily  . furosemide  20 mg Oral Daily  . levothyroxine  75 mcg Oral QAC breakfast  . metoprolol tartrate  25 mg Oral BID  . multivitamin with minerals  1 tablet Oral Daily  . off the beat book   Does not apply Once  . predniSONE  10 mg Oral Q breakfast  . sodium  chloride flush  3 mL Intravenous Q12H  . tadalafil (PAH)  40 mg Oral QHS  . traZODone  50 mg Oral QHS   Continuous Infusions: . sodium chloride 500 mL (01/12/20 0640)     LOS: 13 days      Debbe Odea, MD [facility] Pager: [email or web address] 01/13/2020, 5:50 PM

## 2020-01-13 NOTE — Progress Notes (Signed)
Medications given by Ermalene Postin student nurse were supervised by Laurelyn Sickle RN.

## 2020-01-13 NOTE — Progress Notes (Signed)
Patient has been sinus brady on the monitor this morning with rates from 50s to low 60s. Hospitalist and cardiologist made aware since patient has scheduled metoprolol tartrate this morning. Per cardiology go ahead and give PO metop.

## 2020-01-14 DIAGNOSIS — J849 Interstitial pulmonary disease, unspecified: Secondary | ICD-10-CM | POA: Diagnosis not present

## 2020-01-14 DIAGNOSIS — K529 Noninfective gastroenteritis and colitis, unspecified: Secondary | ICD-10-CM | POA: Diagnosis not present

## 2020-01-14 DIAGNOSIS — I480 Paroxysmal atrial fibrillation: Secondary | ICD-10-CM | POA: Diagnosis not present

## 2020-01-14 DIAGNOSIS — D509 Iron deficiency anemia, unspecified: Secondary | ICD-10-CM | POA: Diagnosis not present

## 2020-01-14 NOTE — Progress Notes (Addendum)
Physical Therapy Treatment Patient Details Name: Julie Mcmillan MRN: [id number] DOB: 01-11-1941 Today's Date: 01/14/2020    History of Present Illness 79 y.o. female with history of paroxysmal atrial fibrillation, pulmonary hypertension on tadalafil who presented to the emergency room with complaints of abdominal discomfort and increased diarrhea.  She has had a problem with this in the past.  She is being followed at [facility] EP for her paroxysmal A. fib. PLOF ind without AD    PT Comments    Pt in bed,  Agrees to session stating she may go home tomorrow and need to walk.  Reports feeling weaker due to limited mobility over the past week.  She stated she was frustrated with initial discharge recommendations for HHPT stating she wanted to go to rehab and refused therapy while here stating she was "mad" because it was determined it was not necessary.  It has been 6 days since she participated in a therapy session.  She initially agreed then stated she was hesitant in case she needed to have a BM.  Offered commode and she declined stating she did not need to go.  She was able to get out of bed without assist.  Stood to walker with min guard and was able to walk 40' with generally weak gait.  While turning she crossed her feet and had a significant LOB requiring mod a x 1 to recover.  LOB likely would have resulted in a fall if not intervention given.  She returned to room with increasingly unsteady gait but no further LOB.  Attributed unsteadiness to weakness.  She refused sitting in recliner at bedside despite encouragement.  Stated she sat up 1 week ago and she was left in the chair for 2 hours.  Pt stated she did not have a call bell and could not call for help.  Pt shown where call bell was built in at the side of the bed and while chair is positioned adjacent to bed and call light she remained firm that she could not reach it.  "I'm not doing that again."  Pt's room is directly  outside nursing station and she was told if she did not want to use or reach bell she could have just asked for help and someone would have heard her.  She said she opted to get back in to bed herself.  She seems to self limit herself in regards to mobility and participation to increase and maintain strength.    Discussed discharge plan.  She stated she does not feel safe going home given increased weakness and LOB.  She stated that if she falls again at home (had prior hip fx) she will "lay on the floor and die."  Discussed with SWS and RN.  Given session today, will adjust discharge recommendations to SNF to increase mobility to PLOF.   Follow Up Recommendations  SNF;Other (comment)     Equipment Recommendations  Rolling walker with 5" wheels    Recommendations for Other Services       Precautions / Restrictions Restrictions Weight Bearing Restrictions: No    Mobility  Bed Mobility Overal bed mobility: Needs Assistance;Modified Independent Bed Mobility: Supine to Sit   Sidelying to sit: Independent          Transfers Overall transfer level: Needs assistance Equipment used: Rolling walker (2 wheeled) Transfers: Sit to/from Stand Sit to Stand: Supervision            Ambulation/Gait Ambulation/Gait assistance: Min guard;Mod  assist Gait Distance (Feet): 80 Feet Assistive device: Rolling walker (2 wheeled) Gait Pattern/deviations: Step-through pattern;Decreased step length - right;Decreased step length - left Gait velocity: decreased   General Gait Details: Gait 40' in hallway with min guard, while turning, has moderate LOB which required assist to prevent fall in hallway.,  Generally unsteady back to room.   Stairs             Wheelchair Mobility    Modified Rankin (Stroke Patients Only)       Balance Overall balance assessment: Needs assistance Sitting-balance support: Feet supported Sitting balance-Leahy Scale: Good     Standing balance support:  Bilateral upper extremity supported Standing balance-Leahy Scale: Fair Standing balance comment: one significant LOB with turn                            Cognition Arousal/Alertness: Awake/alert Behavior During Therapy: WFL for tasks assessed/performed Overall Cognitive Status: Within Functional Limits for tasks assessed                                        Exercises      General Comments        Pertinent Vitals/Pain Pain Assessment: No/denies pain    Home Living                      Prior Function            PT Goals (current goals can now be found in the care plan section) Progress towards PT goals: Not progressing toward goals - comment    Frequency    Min 2X/week      PT Plan Discharge plan needs to be updated    Co-evaluation              AM-PAC PT "6 Clicks" Mobility   Outcome Measure  Help needed turning from your back to your side while in a flat bed without using bedrails?: None Help needed moving from lying on your back to sitting on the side of a flat bed without using bedrails?: None Help needed moving to and from a bed to a chair (including a wheelchair)?: A Little Help needed standing up from a chair using your arms (e.g., wheelchair or bedside chair)?: A Little Help needed to walk in hospital room?: A Little Help needed climbing 3-5 steps with a railing? : A Little 6 Click Score: 20    End of Session Equipment Utilized During Treatment: Gait belt;Oxygen Activity Tolerance: Patient tolerated treatment well;Patient limited by fatigue Patient left: in bed;with nursing/sitter in room Nurse Communication: Mobility status;Other (comment)       Time: 2423-5361 PT Time Calculation (min) (ACUTE ONLY): 17 min  Charges:  $Gait Training: 8-22 mins                    Chesley Noon, PTA 01/14/20, 10:11 AM

## 2020-01-14 NOTE — Progress Notes (Signed)
PROGRESS NOTE    Julie Mcmillan   QYP:[id number]  DOB: March 14, 1941  DOA: 12/30/2019 PCP: Kirk Ruths, MD   Brief Narrative:  Julie Mcmillan  is a 79 y.o. female with medical history significant for interstitial lung disease/pulmonary fibrosis, chronic respiratory failure with hypoxia on 4 L supplemental O2 via [place], paroxysmal atrial fibrillation on Eliquis, pulmonary hypertension, diet-controlled type 2 diabetes, hypertension, hypothyroidism, depression/anxiety who presents to the ED for evaluation of persistent nausea, vomiting, and diarrhea.  She was treated for gastroenteritis. Her GI issues have resolved but she has been having A-fib with RVR and cardiology was consulted to help manage this.    Subjective: Nausea has resolved. She is eating well.     Assessment & Plan:   Principal Problem:   Gastroenteritis - has resolved.    Active Problems:   PAF (paroxysmal atrial fibrillation) ([place]) - cardiology assisting with management- Multaq started but still having some very short bursts of A flutter- cardiology states that she is stable at this point for d/c - on Lopressor and Apixaban as well  Iron deficiency - cont Iron supplements     Interstitial lung disease ([facility]) - cont Tadalafil and Prednisone  Time spent in minutes: 35 DVT prophylaxis: Apixaban Code Status: Full  Family Communication: son Disposition Plan: from home- OK to d/c per cardiology - original plan was home - PT has determined today that she needs SNF- I don't have a plan for discharge yet from [facility] Consultants:   Cardiology Procedures:   none Antimicrobials:  Anti-infectives (From admission, onward)   None       Objective: Vitals:   01/13/20 2245 01/14/20 0521 01/14/20 0806 01/14/20 1156  BP: (!) 101/50 (!) 122/53 (!) 104/53 (!) 111/50  Pulse: 62 (!) 58 68 (!) 57  Resp:   18 18  Temp:  98.1 F (36.7 C) 98 F (36.7 C) 98.8 F (37.1 C)  TempSrc:  Oral Oral Oral  SpO2: 100% 100%  96% 100%  Weight:  65.4 kg    Height:        Intake/Output Summary (Last 24 hours) at 01/14/2020 1449 Last data filed at 01/14/2020 1345 Gross per 24 hour  Intake 483 ml  Output 900 ml  Net -417 ml   Filed Weights   01/12/20 0443 01/13/20 0446 01/14/20 0521  Weight: 65.5 kg 68.4 kg 65.4 kg    Examination: General exam: Appears comfortable  HEENT: PERRLA, oral mucosa moist, no sclera icterus or thrush Respiratory system: Clear to auscultation. Respiratory effort normal. Cardiovascular system: S1 & S2 heard, RRR.   Gastrointestinal system: Abdomen soft, non-tender, nondistended. Normal bowel sounds. Central nervous system: Alert and oriented. No focal neurological deficits. Extremities: No cyanosis, clubbing or edema Skin: No rashes or ulcers Psychiatry:  Mood & affect appropriate.     Data Reviewed: I have personally reviewed following labs and imaging studies  CBC: Recent Labs  Lab 01/09/20 0628 01/10/20 0622 01/11/20 0330 01/12/20 0402 01/13/20 0601  WBC 8.3 8.7 9.0 8.6 6.9  HGB 9.2* 9.7* 9.8* 9.3* 8.7*  HCT 30.1* 31.1* 31.9* 29.9* 29.2*  MCV 76.8* 76.6* 77.4* 78.5* 82.3  PLT 268 249 255 259 144   Basic Metabolic Panel: Recent Labs  Lab 01/09/20 0628 01/10/20 0622 01/11/20 0330 01/12/20 0402 01/13/20 0601  NA 138 137 136 138 140  K 4.3 4.1 5.1 4.3 4.1  CL 100 98 100 102 102  CO2 _0 GLUCOSE 101* 102* 105* 119* 109*  BUN _0 24* 23  CREATININE 1.09* 1.05* 1.21* 1.22* 0.97  CALCIUM 8.9 8.8* 8.9 8.8* 8.6*   GFR: Estimated Creatinine Clearance: 39.9 mL/min (by C-G formula based on SCr of 0.97 mg/dL). Liver Function Tests: No results for input(s): AST, ALT, ALKPHOS, BILITOT, PROT, ALBUMIN in the last 168 hours. No results for input(s): LIPASE, AMYLASE in the last 168 hours. No results for input(s): AMMONIA in the last 168 hours. Coagulation Profile: No results for input(s): INR, PROTIME in the last 168 hours. Cardiac Enzymes: No  results for input(s): CKTOTAL, CKMB, CKMBINDEX, TROPONINI in the last 168 hours. BNP (last 3 results) No results for input(s): PROBNP in the last 8760 hours. HbA1C: No results for input(s): HGBA1C in the last 72 hours. CBG: No results for input(s): GLUCAP in the last 168 hours. Lipid Profile: No results for input(s): CHOL, HDL, LDLCALC, TRIG, CHOLHDL, LDLDIRECT in the last 72 hours. Thyroid Function Tests: No results for input(s): TSH, T4TOTAL, FREET4, T3FREE, THYROIDAB in the last 72 hours. Anemia Panel: No results for input(s): VITAMINB12, FOLATE, FERRITIN, TIBC, IRON, RETICCTPCT in the last 72 hours. Urine analysis:    Component Value Date/Time   COLORURINE YELLOW (A) 12/31/2019 0029   APPEARANCEUR HAZY (A) 12/31/2019 0029   APPEARANCEUR Clear 11/04/2018 1531   LABSPEC 1.020 12/31/2019 0029   PHURINE 5.0 12/31/2019 0029   GLUCOSEU NEGATIVE 12/31/2019 0029   HGBUR NEGATIVE 12/31/2019 0029   BILIRUBINUR NEGATIVE 12/31/2019 0029   BILIRUBINUR Negative 11/04/2018 1531   KETONESUR NEGATIVE 12/31/2019 0029   PROTEINUR 30 (A) 12/31/2019 0029   NITRITE NEGATIVE 12/31/2019 0029   LEUKOCYTESUR MODERATE (A) 12/31/2019 0029   Sepsis Labs: _1 (procalcitonin:4,lacticidven:4) )No results found for this or any previous visit (from the past 240 hour(s)).       Radiology Studies: No results found.    Scheduled Meds: . apixaban  5 mg Oral BID  . busPIRone  7.5 mg Oral BID  . calcium carbonate  1 tablet Oral BID  . diltiazem  240 mg Oral Daily  . dronedarone  400 mg Oral BID WC  . famotidine  20 mg Oral Daily  . feeding supplement  1 Container Oral TID BM  . ferrous sulfate  325 mg Oral Q breakfast  . FLUoxetine  40 mg Oral Daily  . furosemide  20 mg Oral Daily  . levothyroxine  75 mcg Oral QAC breakfast  . metoprolol tartrate  25 mg Oral BID  . multivitamin with minerals  1 tablet Oral Daily  . off the beat book   Does not apply Once  . predniSONE  10 mg Oral Q  breakfast  . sodium chloride flush  3 mL Intravenous Q12H  . tadalafil (PAH)  40 mg Oral QHS  . traZODone  50 mg Oral QHS   Continuous Infusions: . sodium chloride 500 mL (01/12/20 0640)     LOS: 14 days      Debbe Odea, MD [facility] Pager: [email or web address] 01/14/2020, 2:49 PM

## 2020-01-14 NOTE — Care Management Important Message (Signed)
Important Message  Patient Details  Name: Julie Mcmillan MRN: [id number] Date of Birth: September 23, 1941   Medicare Important Message Given:  Yes     Dannette Barbara 01/14/2020, 2:16 PM

## 2020-01-14 NOTE — Plan of Care (Signed)
  Problem: Health Behavior/Discharge Planning: Goal: Ability to manage health-related needs will improve Outcome: Progressing

## 2020-01-14 NOTE — Progress Notes (Signed)
Patient Name: Julie Mcmillan Date of Encounter: 01/14/2020  Hospital Problem List     Principal Problem:   Gastroenteritis Active Problems:   Type 2 diabetes mellitus (HCC)   Anemia, iron deficiency   Adult hypothyroidism   Hypertension associated with diabetes ([place])   PAF (paroxysmal atrial fibrillation) ([place])   Interstitial lung disease ([place])    Patient Profile     79 y.o.femalewith history ofparoxysmal atrial fibrillation, pulmonary hypertension on tadalafil who presented to the emergency room with complaints of abdominal discomfort and increased diarrhea.   Subjective   Episodes of intermittent aflutter with predominantly nsr. Asymptomatic.  Inpatient Medications    . apixaban  5 mg Oral BID  . busPIRone  7.5 mg Oral BID  . calcium carbonate  1 tablet Oral BID  . diltiazem  240 mg Oral Daily  . dronedarone  400 mg Oral BID WC  . famotidine  20 mg Oral Daily  . feeding supplement  1 Container Oral TID BM  . ferrous sulfate  325 mg Oral Q breakfast  . FLUoxetine  40 mg Oral Daily  . furosemide  20 mg Oral Daily  . levothyroxine  75 mcg Oral QAC breakfast  . metoprolol tartrate  25 mg Oral BID  . multivitamin with minerals  1 tablet Oral Daily  . off the beat book   Does not apply Once  . predniSONE  10 mg Oral Q breakfast  . sodium chloride flush  3 mL Intravenous Q12H  . tadalafil (PAH)  40 mg Oral QHS  . traZODone  50 mg Oral QHS    Vital Signs    Vitals:   01/13/20 1917 01/13/20 2126 01/13/20 2245 01/14/20 0521  BP: (!) 94/47 (!) 87/33 (!) 101/50 (!) 122/53  Pulse: (!) 50 (!) 57 62 (!) 58  Resp:      Temp: 98.7 F (37.1 C) 98.7 F (37.1 C)  98.1 F (36.7 C)  TempSrc: Oral   Oral  SpO2: 100% 99% 100% 100%  Weight:    65.4 kg  Height:        Intake/Output Summary (Last 24 hours) at 01/14/2020 0729 Last data filed at 01/14/2020 0528 Gross per 24 hour  Intake 220 ml  Output 300 ml  Net -80 ml   Filed Weights   01/12/20 0443 01/13/20 0446  01/14/20 0521  Weight: 65.5 kg 68.4 kg 65.4 kg    Physical Exam    GEN: Well nourished, well developed, in no acute distress.  HEENT: normal.  Neck: Supple, no JVD, carotid bruits, or masses. Cardiac: RRR, no murmurs, rubs, or gallops. No clubbing, cyanosis, edema.  Radials/DP/PT 2+ and equal bilaterally.  Respiratory:  Respirations regular and unlabored, clear to auscultation bilaterally. GI: Soft, nontender, nondistended, BS + x 4. MS: no deformity or atrophy. Skin: warm and dry, no rash. Neuro:  Strength and sensation are intact. Psych: Normal affect.  Labs    CBC Recent Labs    01/12/20 0402 01/13/20 0601  WBC 8.6 6.9  HGB 9.3* 8.7*  HCT 29.9* 29.2*  MCV 78.5* 82.3  PLT 259 622   Basic Metabolic Panel Recent Labs    01/12/20 0402 01/13/20 0601  NA 138 140  K 4.3 4.1  CL 102 102  CO2 27 30  GLUCOSE 119* 109*  BUN 24* 23  CREATININE 1.22* 0.97  CALCIUM 8.8* 8.6*   Liver Function Tests No results for input(s): AST, ALT, ALKPHOS, BILITOT, PROT, ALBUMIN in the last 72 hours. No  results for input(s): LIPASE, AMYLASE in the last 72 hours. Cardiac Enzymes No results for input(s): CKTOTAL, CKMB, CKMBINDEX, TROPONINI in the last 72 hours. BNP No results for input(s): BNP in the last 72 hours. D-Dimer No results for input(s): DDIMER in the last 72 hours. Hemoglobin A1C No results for input(s): HGBA1C in the last 72 hours. Fasting Lipid Panel No results for input(s): CHOL, HDL, LDLCALC, TRIG, CHOLHDL, LDLDIRECT in the last 72 hours. Thyroid Function Tests No results for input(s): TSH, T4TOTAL, T3FREE, THYROIDAB in the last 72 hours.  Invalid input(s): FREET3  Telemetry    Sinus brady with intermitant aflutterwith variable block  ECG       Radiology    CT ABDOMEN PELVIS W CONTRAST  Result Date: 12/29/2019 CLINICAL DATA:  Nausea, vomiting and diarrhea since last night, intermittent abdominal pain rated 4/10, history of type II diabetes mellitus, atrial  fibrillation, diverticulitis, GERD, hypertension, irritable bowel syndrome, pulmonary hypertension, pulmonary fibrosis EXAM: CT ABDOMEN AND PELVIS WITH CONTRAST TECHNIQUE: Multidetector CT imaging of the abdomen and pelvis was performed using the standard protocol following bolus administration of intravenous contrast. Sagittal and coronal MPR images reconstructed from axial data set. CONTRAST:  115m OMNIPAQUE IOHEXOL 300 MG/ML SOLN IV. No oral contrast. COMPARISON:  09/04/2019 FINDINGS: Lower chest: Bibasilar pulmonary fibrosis unchanged. Hepatobiliary: Contracted gallbladder. Liver unremarkable Pancreas: Hypoechoic nodule at body of pancreas, 8 x 12 mm, grossly unchanged. No additional pancreatic abnormalities. Spleen: Several tiny nonspecific low-attenuation foci within spleen, nonspecific. Adrenals/Urinary Tract: Adrenal glands normal appearance. Cortical scarring and small cysts within both kidneys. No enhancing renal mass, hydronephrosis or urinary tract calcification. Bladder and ureters unremarkable. Stomach/Bowel: Low lying cecum in pelvis. Appendix surgically absent by history. Distal colonic diverticulosis without evidence of diverticulitis. Moderate-sized hiatal hernia. Vascular/Lymphatic: Scattered atherosclerotic calcifications aorta and iliac arteries without aneurysm. Additional calcified plaques at the origins of the celiac and superior mesenteric arteries. No adenopathy. Reproductive: Uterus surgically absent with nonvisualization of ovaries Other: No free air or free fluid. No acute inflammatory process or hernia identified. Musculoskeletal: Osseous demineralization marked compression fracture of T8 vertebral body unchanged since another CT of 10/26/2016. IMPRESSION: Distal colonic diverticulosis without evidence of diverticulitis. Moderate-sized hiatal hernia. Stable cystic lesion at body of pancreas 8 x 12 x 14 mm. Stable bibasilar pulmonary fibrosis. Stable marked compression fracture T8  vertebral body. No acute intra-abdominal or intrapelvic abnormalities. Aortic Atherosclerosis (ICD10-I70.0). Electronically Signed   By: MLavonia DanaM.D.   On: 12/29/2019 13:09   DG Chest Port 1 View  Result Date: 01/06/2020 CLINICAL DATA:  Hypoxia. EXAM: PORTABLE CHEST 1 VIEW COMPARISON:  12/29/2019 FINDINGS: Heart size is normal. Chronic fibrotic lung disease peers similar to previous studies. No evidence of acute consolidation, collapse or effusion. Low level inflammatory changes could be hidden within the extensive chronic findings. No bone abnormality. IMPRESSION: Chronic fibrotic lung disease without visible change. Lower level inflammatory changes could be hidden within the extensive chronic findings. Electronically Signed   By: MNelson ChimesM.D.   On: 01/06/2020 12:23   DG Chest Portable 1 View  Result Date: 12/29/2019 CLINICAL DATA:  Pain with nausea and vomiting EXAM: PORTABLE CHEST 1 VIEW COMPARISON:  August 10, 2019 FINDINGS: There is extensive fibrosis throughout the lungs bilaterally. There is no frank edema or consolidation. Heart is upper normal in size with pulmonary vascularity normal. No adenopathy. No bone lesions. There is aortic atherosclerosis. IMPRESSION: Extensive fibrosis throughout the lungs, stable. No edema or airspace opacity. Stable cardiac silhouette. No adenopathy.  Aortic Atherosclerosis (ICD10-I70.0). Electronically Signed   By: Lowella Grip III M.D.   On: 12/29/2019 13:31   ECHOCARDIOGRAM COMPLETE  Result Date: 01/03/2020    ECHOCARDIOGRAM REPORT   Patient Name:   Julie Mcmillan Date of Exam: 01/02/2020 Medical Rec #:  [id number]        Height:       64.0 in Accession #:    [id number]       Weight:       147.0 lb Date of Birth:  Aug 19, 1941         BSA:          1.716 m Patient Age:    32 years         BP:           144/61 mmHg Patient Gender: F                HR:           66 bpm. Exam Location:  [facility] Procedure: 2D Echo Indications:     Abnormal ECG 794.31/ R94.31   History:         Patient has prior history of Echocardiogram examinations, most                  recent 03/26/2015.  Sonographer:     Arville Go RDCS Referring Phys:  [id number] [place] Diagnosing Phys: Bartholome Bill MD IMPRESSIONS  1. Left ventricular ejection fraction, by estimation, is 60 to 65%. The left ventricle has normal function. The left ventricle has no regional wall motion abnormalities. There is mild asymmetric left ventricular hypertrophy of the basal-septal segment. Left ventricular diastolic parameters are consistent with Grade I diastolic dysfunction (impaired relaxation).  2. Right ventricular systolic function is normal. The right ventricular size is normal.  3. Left atrial size was mildly dilated.  4. Right atrial size was mildly dilated.  5. The mitral valve is grossly normal. Trivial mitral valve regurgitation.  6. The aortic valve is tricuspid. Aortic valve regurgitation is mild. No aortic stenosis is present. FINDINGS  Left Ventricle: Left ventricular ejection fraction, by estimation, is 60 to 65%. The left ventricle has normal function. The left ventricle has no regional wall motion abnormalities. The left ventricular internal cavity size was normal in size. There is  mild asymmetric left ventricular hypertrophy of the basal-septal segment. Left ventricular diastolic parameters are consistent with Grade I diastolic dysfunction (impaired relaxation). Right Ventricle: The right ventricular size is normal. No increase in right ventricular wall thickness. Right ventricular systolic function is normal. Left Atrium: Left atrial size was mildly dilated. Right Atrium: Right atrial size was mildly dilated. Pericardium: There is no evidence of pericardial effusion. Mitral Valve: The mitral valve is grossly normal. Trivial mitral valve regurgitation. Tricuspid Valve: The tricuspid valve is normal in structure. Tricuspid valve regurgitation is mild. Aortic Valve: The aortic valve is tricuspid.  Aortic valve regurgitation is mild. Aortic regurgitation PHT measures 639 msec. No aortic stenosis is present. Aortic valve peak gradient measures 11.2 mmHg. Pulmonic Valve: The pulmonic valve was grossly normal. Pulmonic valve regurgitation is trivial. Aorta: The aortic root is normal in size and structure. IAS/Shunts: The interatrial septum was not assessed.  LEFT VENTRICLE PLAX 2D LVIDd:         4.31 cm  Diastology LVIDs:         2.89 cm  LV e' lateral:   4.68 cm/s LV PW:         1.32 cm  LV E/e' lateral: 19.5 LV IVS:        1.14 cm  LV e' medial:    4.46 cm/s LVOT diam:     1.90 cm  LV E/e' medial:  20.4 LV SV:         67 LV SV Index:   39 LVOT Area:     2.84 cm  RIGHT VENTRICLE RV Basal diam:  3.23 cm RV S prime:     16.60 cm/s TAPSE (M-mode): 2.2 cm LEFT ATRIUM             Index       RIGHT ATRIUM           Index LA diam:        3.90 cm 2.27 cm/m  RA Area:     12.80 cm LA Vol (A2C):   25.2 ml 14.68 ml/m RA Volume:   29.60 ml  17.25 ml/m LA Vol (A4C):   37.0 ml 21.56 ml/m LA Biplane Vol: 32.7 ml 19.05 ml/m  AORTIC VALVE                PULMONIC VALVE AV Area (Vmax): 1.90 cm    PV Vmax:       1.17 m/s AV Vmax:        167.00 cm/s PV Peak grad:  5.5 mmHg AV Peak Grad:   11.2 mmHg LVOT Vmax:      112.00 cm/s LVOT Vmean:     64.500 cm/s LVOT VTI:       0.235 m AI PHT:         639 msec  AORTA Ao Root diam: 2.90 cm Ao Asc diam:  2.70 cm MITRAL VALVE                TRICUSPID VALVE MV Area (PHT): 3.42 cm     TV Peak grad:   55.2 mmHg MV Decel Time: 222 msec     TV Vmax:        3.72 m/s MV E velocity: 91.20 cm/s MV A velocity: 127.00 cm/s  SHUNTS MV E/A ratio:  0.72         Systemic VTI:  0.24 m                             Systemic Diam: 1.90 cm Bartholome Bill MD Electronically signed by Bartholome Bill MD Signature Date/Time: 01/03/2020/9:14:55 AM    Final     Assessment & Plan      79 y.o.femalewith history ofparoxysmal atrial fibrillation, pulmonary hypertension on tadalafil who presented to the emergency  room with complaints of abdominal discomfort and increased diarrhea.   1.Atrialfib/flutter-discussed with electrophysiology. Has switched toMultaq 400 mg twice daily. Still having episodes of breakthrough with flutter. Currently in nsr with episodes of breakthrough aflutter with variable block. Wouldcontinueon Multaq 400 bid, cardizemCD 240 mg dialy, and low dose metoprolol following blood pressure and rhythm/rate.apixaban 5 bid. Would ambulate today and plan discharge today or in am. Breakthrough is not prolonged and would continue with this regimen for now. Will have outpatient followup in less than 1 week.   2. Pulmonary fibrosiis-continue with tadalafil 40 mg q hs.  Signed, Javier Docker Doniqua Saxby MD 01/14/2020, 7:29 AM  Pager: [phone]

## 2020-01-15 DIAGNOSIS — E039 Hypothyroidism, unspecified: Secondary | ICD-10-CM

## 2020-01-15 NOTE — Progress Notes (Signed)
Physical Therapy Treatment Patient Details Name: Julie Mcmillan MRN: [id number] DOB: 1941-07-06 Today's Date: 01/15/2020    History of Present Illness 79 y.o. female with history of paroxysmal atrial fibrillation, pulmonary hypertension on tadalafil who presented to the emergency room with complaints of abdominal discomfort and increased diarrhea.  She has had a problem with this in the past.  She is being followed at [facility] for her paroxysmal A. fib. PLOF ind without AD    PT Comments    Patient agreeable to participation this date, requiring much less encouragement than during previous sessions.  Tolerating slight increase in gait distance (120' with RW), cga/min assist for safety throughout.  Does self-initiate 2-3 standing rest breaks periodically throughout distance, but recovers within 10-15 seconds of static stance.  Vitals stable and WFL throughout session. Mild higher-level balance deficits evident (as indicated by single R lateral LOB, cga/min assist for recovery, and decreased 5x sit/stand time); patient with fair/good awareness and use of compensatory strategies.  Do recommend continued use of RW at all times upon discharge. Patient aware and in agreement; has access to RW and 4WRW in home environment.    Follow Up Recommendations  Home health PT(HHOT, HHRN, HHaide)     Equipment Recommendations  (has RW and 4WRW for use at home)    Recommendations for Other Services       Precautions / Restrictions Precautions Precautions: Fall Restrictions Weight Bearing Restrictions: No    Mobility  Bed Mobility Overal bed mobility: Modified Independent                Transfers Overall transfer level: Needs assistance Equipment used: Rolling walker (2 wheeled) Transfers: Sit to/from Stand Sit to Stand: Supervision         General transfer comment: cuing for hand placement; does require use of UEs to assist with lift off and  stabilization  Ambulation/Gait Ambulation/Gait assistance: Min guard;Min assist Gait Distance (Feet): 120 Feet Assistive device: Rolling walker (2 wheeled)       General Gait Details: reciprocal stepping pattern with slow, guarded performance; 2-3 standing rest breaks due to 'weakness', but recovers within 10-15 seconds.  Stepping pattern slightly choppy with noted delay in dymamic balance reactions; do recommend continued use of RW at all times.  Vitals stable and WFL with exertion.   Stairs             Wheelchair Mobility    Modified Rankin (Stroke Patients Only)       Balance Overall balance assessment: Needs assistance Sitting-balance support: No upper extremity supported;Feet supported Sitting balance-Leahy Scale: Good     Standing balance support: Bilateral upper extremity supported Standing balance-Leahy Scale: Fair Standing balance comment: cga/min assist with R lateral LOB x1 during session; relies heavily on UE support and LE step strategy for balance recovery                            Cognition Arousal/Alertness: Awake/alert Behavior During Therapy: WFL for tasks assessed/performed Overall Cognitive Status: Within Functional Limits for tasks assessed                                        Exercises Other Exercises Other Exercises: 5x sit/stand with RW, sup: 60 seconds, decreased compared to age-matched norms; indicative of decreased LE strength/power    General Comments  Pertinent Vitals/Pain Pain Assessment: No/denies pain    Home Living                      Prior Function            PT Goals (current goals can now be found in the care plan section) Acute Rehab PT Goals Patient Stated Goal: go home PT Goal Formulation: With patient Time For Goal Achievement: 01/16/20 Potential to Achieve Goals: Good Progress towards PT goals: Progressing toward goals    Frequency    Min 2X/week       PT Plan Discharge plan needs to be updated    Co-evaluation              AM-PAC PT "6 Clicks" Mobility   Outcome Measure  Help needed turning from your back to your side while in a flat bed without using bedrails?: None Help needed moving from lying on your back to sitting on the side of a flat bed without using bedrails?: None Help needed moving to and from a bed to a chair (including a wheelchair)?: None Help needed standing up from a chair using your arms (e.g., wheelchair or bedside chair)?: None Help needed to walk in hospital room?: A Little Help needed climbing 3-5 steps with a railing? : A Little 6 Click Score: 22    End of Session Equipment Utilized During Treatment: Gait belt;Oxygen Activity Tolerance: Patient tolerated treatment well Patient left: in chair;with call bell/phone within reach;with chair alarm set Nurse Communication: Mobility status PT Visit Diagnosis: Other abnormalities of gait and mobility (R26.89);Unsteadiness on feet (R26.81);Muscle weakness (generalized) (M62.81)     Time: 1364-3837 PT Time Calculation (min) (ACUTE ONLY): 35 min  Charges:  $Gait Training: 8-22 mins $Therapeutic Activity: 8-22 mins                     Yuleimy Kretz H. Owens Shark, PT, DPT, NCS 01/15/20, 9:55 AM [phone]

## 2020-01-15 NOTE — Progress Notes (Signed)
Patient Name: [name] Date of Encounter: 01/15/2020  Hospital Problem List     Principal Problem:   Gastroenteritis Active Problems:   Type 2 diabetes mellitus (HCC)   Anemia, iron deficiency   Adult hypothyroidism   Hypertension associated with diabetes ([place])   PAF (paroxysmal atrial fibrillation) ([place])   Interstitial lung disease ([place])    Patient Profile     79 y.o.femalewith history ofparoxysmal atrial fibrillation, pulmonary hypertension on tadalafil who presented to the emergency room with complaints of abdominal discomfort and increased diarrhea. Had intermittent A. fib with RVR.  Was on sotalol 80 twice daily.  This was discontinued due to prolonged QT.  Sotalol discontinued currently on Multaq at 400 twice daily.  Currently also taking Cardizem and metoprolol.  Last 24 hours has remained in sinus bradycardia.  Asymptomatic from a cardiac standpoint.  Subjective   Awaiting consideration for SNF per patient request  Inpatient Medications    . apixaban  5 mg Oral BID  . busPIRone  7.5 mg Oral BID  . calcium carbonate  1 tablet Oral BID  . diltiazem  240 mg Oral Daily  . dronedarone  400 mg Oral BID WC  . famotidine  20 mg Oral Daily  . feeding supplement  1 Container Oral TID BM  . ferrous sulfate  325 mg Oral Q breakfast  . FLUoxetine  40 mg Oral Daily  . furosemide  20 mg Oral Daily  . levothyroxine  75 mcg Oral QAC breakfast  . metoprolol tartrate  25 mg Oral BID  . multivitamin with minerals  1 tablet Oral Daily  . off the beat book   Does not apply Once  . predniSONE  10 mg Oral Q breakfast  . sodium chloride flush  3 mL Intravenous Q12H  . tadalafil (PAH)  40 mg Oral QHS  . traZODone  50 mg Oral QHS    Vital Signs    Vitals:   01/15/20 0449 01/15/20 0451 01/15/20 0525 01/15/20 0611  BP: (!) 96/47   (!) 98/44  Pulse: (!) 47   (!) 46  Resp: 20  16   Temp: 97.6 F (36.4 C)   98.4 F (36.9 C)  TempSrc: Oral     SpO2: 100%   100%   Weight:  65.9 kg    Height:        Intake/Output Summary (Last 24 hours) at 01/15/2020 0715 Last data filed at 01/15/2020 0451 Gross per 24 hour  Intake 723 ml  Output 1425 ml  Net -702 ml   Filed Weights   01/13/20 0446 01/14/20 0521 01/15/20 0451  Weight: 68.4 kg 65.4 kg 65.9 kg    Physical Exam    GEN: Well nourished, well developed, in no acute distress.  HEENT: normal.  Neck: Supple, no JVD, carotid bruits, or masses. Cardiac: RRR, no murmurs, rubs, or gallops. No clubbing, cyanosis, edema.  Radials/DP/PT 2+ and equal bilaterally.  Respiratory:  Respirations regular and unlabored, clear to auscultation bilaterally. GI: Soft, nontender, nondistended, BS + x 4. MS: no deformity or atrophy. Skin: warm and dry, no rash. Neuro:  Strength and sensation are intact. Psych: Normal affect.  Labs    CBC Recent Labs    01/13/20 0601  WBC 6.9  HGB 8.7*  HCT 29.2*  MCV 82.3  PLT 157   Basic Metabolic Panel Recent Labs    01/13/20 0601  NA 140  K 4.1  CL 102  CO2 30  GLUCOSE 109*  BUN  23  CREATININE 0.97  CALCIUM 8.6*   Liver Function Tests No results for input(s): AST, ALT, ALKPHOS, BILITOT, PROT, ALBUMIN in the last 72 hours. No results for input(s): LIPASE, AMYLASE in the last 72 hours. Cardiac Enzymes No results for input(s): CKTOTAL, CKMB, CKMBINDEX, TROPONINI in the last 72 hours. BNP No results for input(s): BNP in the last 72 hours. D-Dimer No results for input(s): DDIMER in the last 72 hours. Hemoglobin A1C No results for input(s): HGBA1C in the last 72 hours. Fasting Lipid Panel No results for input(s): CHOL, HDL, LDLCALC, TRIG, CHOLHDL, LDLDIRECT in the last 72 hours. Thyroid Function Tests No results for input(s): TSH, T4TOTAL, T3FREE, THYROIDAB in the last 72 hours.  Invalid input(s): FREET3  Telemetry    Sinus bradycardia  ECG       Radiology    CT ABDOMEN PELVIS W CONTRAST  Result Date: 12/29/2019 CLINICAL DATA:  Nausea,  vomiting and diarrhea since last night, intermittent abdominal pain rated 4/10, history of type II diabetes mellitus, atrial fibrillation, diverticulitis, GERD, hypertension, irritable bowel syndrome, pulmonary hypertension, pulmonary fibrosis EXAM: CT ABDOMEN AND PELVIS WITH CONTRAST TECHNIQUE: Multidetector CT imaging of the abdomen and pelvis was performed using the standard protocol following bolus administration of intravenous contrast. Sagittal and coronal MPR images reconstructed from axial data set. CONTRAST:  1101m OMNIPAQUE IOHEXOL 300 MG/ML SOLN IV. No oral contrast. COMPARISON:  09/04/2019 FINDINGS: Lower chest: Bibasilar pulmonary fibrosis unchanged. Hepatobiliary: Contracted gallbladder. Liver unremarkable Pancreas: Hypoechoic nodule at body of pancreas, 8 x 12 mm, grossly unchanged. No additional pancreatic abnormalities. Spleen: Several tiny nonspecific low-attenuation foci within spleen, nonspecific. Adrenals/Urinary Tract: Adrenal glands normal appearance. Cortical scarring and small cysts within both kidneys. No enhancing renal mass, hydronephrosis or urinary tract calcification. Bladder and ureters unremarkable. Stomach/Bowel: Low lying cecum in pelvis. Appendix surgically absent by history. Distal colonic diverticulosis without evidence of diverticulitis. Moderate-sized hiatal hernia. Vascular/Lymphatic: Scattered atherosclerotic calcifications aorta and iliac arteries without aneurysm. Additional calcified plaques at the origins of the celiac and superior mesenteric arteries. No adenopathy. Reproductive: Uterus surgically absent with nonvisualization of ovaries Other: No free air or free fluid. No acute inflammatory process or hernia identified. Musculoskeletal: Osseous demineralization marked compression fracture of T8 vertebral body unchanged since another CT of 10/26/2016. IMPRESSION: Distal colonic diverticulosis without evidence of diverticulitis. Moderate-sized hiatal hernia. Stable  cystic lesion at body of pancreas 8 x 12 x 14 mm. Stable bibasilar pulmonary fibrosis. Stable marked compression fracture T8 vertebral body. No acute intra-abdominal or intrapelvic abnormalities. Aortic Atherosclerosis (ICD10-I70.0). Electronically Signed   By: MLavonia DanaM.D.   On: 12/29/2019 13:09   DG Chest Port 1 View  Result Date: 01/06/2020 CLINICAL DATA:  Hypoxia. EXAM: PORTABLE CHEST 1 VIEW COMPARISON:  12/29/2019 FINDINGS: Heart size is normal. Chronic fibrotic lung disease peers similar to previous studies. No evidence of acute consolidation, collapse or effusion. Low level inflammatory changes could be hidden within the extensive chronic findings. No bone abnormality. IMPRESSION: Chronic fibrotic lung disease without visible change. Lower level inflammatory changes could be hidden within the extensive chronic findings. Electronically Signed   By: MNelson ChimesM.D.   On: 01/06/2020 12:23   DG Chest Portable 1 View  Result Date: 12/29/2019 CLINICAL DATA:  Pain with nausea and vomiting EXAM: PORTABLE CHEST 1 VIEW COMPARISON:  August 10, 2019 FINDINGS: There is extensive fibrosis throughout the lungs bilaterally. There is no frank edema or consolidation. Heart is upper normal in size with pulmonary vascularity normal. No adenopathy. No  bone lesions. There is aortic atherosclerosis. IMPRESSION: Extensive fibrosis throughout the lungs, stable. No edema or airspace opacity. Stable cardiac silhouette. No adenopathy. Aortic Atherosclerosis (ICD10-I70.0). Electronically Signed   By: Lowella Grip III M.D.   On: 12/29/2019 13:31   ECHOCARDIOGRAM COMPLETE  Result Date: 01/03/2020    ECHOCARDIOGRAM REPORT   Patient Name:   Julie Mcmillan Date of Exam: 01/02/2020 Medical Rec #:  [id number]        Height:       64.0 in Accession #:    [id number]       Weight:       147.0 lb Date of Birth:  10-10-1940         BSA:          1.716 m Patient Age:    80 years         BP:           144/61 mmHg Patient Gender: F                 HR:           66 bpm. Exam Location:  [facility] Procedure: 2D Echo Indications:     Abnormal ECG 794.31/ R94.31  History:         Patient has prior history of Echocardiogram examinations, most                  recent 03/26/2015.  Sonographer:     Arville Go RDCS Referring Phys:  [id number] [place] Diagnosing Phys: Bartholome Bill MD IMPRESSIONS  1. Left ventricular ejection fraction, by estimation, is 60 to 65%. The left ventricle has normal function. The left ventricle has no regional wall motion abnormalities. There is mild asymmetric left ventricular hypertrophy of the basal-septal segment. Left ventricular diastolic parameters are consistent with Grade I diastolic dysfunction (impaired relaxation).  2. Right ventricular systolic function is normal. The right ventricular size is normal.  3. Left atrial size was mildly dilated.  4. Right atrial size was mildly dilated.  5. The mitral valve is grossly normal. Trivial mitral valve regurgitation.  6. The aortic valve is tricuspid. Aortic valve regurgitation is mild. No aortic stenosis is present. FINDINGS  Left Ventricle: Left ventricular ejection fraction, by estimation, is 60 to 65%. The left ventricle has normal function. The left ventricle has no regional wall motion abnormalities. The left ventricular internal cavity size was normal in size. There is  mild asymmetric left ventricular hypertrophy of the basal-septal segment. Left ventricular diastolic parameters are consistent with Grade I diastolic dysfunction (impaired relaxation). Right Ventricle: The right ventricular size is normal. No increase in right ventricular wall thickness. Right ventricular systolic function is normal. Left Atrium: Left atrial size was mildly dilated. Right Atrium: Right atrial size was mildly dilated. Pericardium: There is no evidence of pericardial effusion. Mitral Valve: The mitral valve is grossly normal. Trivial mitral valve regurgitation. Tricuspid Valve: The  tricuspid valve is normal in structure. Tricuspid valve regurgitation is mild. Aortic Valve: The aortic valve is tricuspid. Aortic valve regurgitation is mild. Aortic regurgitation PHT measures 639 msec. No aortic stenosis is present. Aortic valve peak gradient measures 11.2 mmHg. Pulmonic Valve: The pulmonic valve was grossly normal. Pulmonic valve regurgitation is trivial. Aorta: The aortic root is normal in size and structure. IAS/Shunts: The interatrial septum was not assessed.  LEFT VENTRICLE PLAX 2D LVIDd:         4.31 cm  Diastology LVIDs:  2.89 cm  LV e' lateral:   4.68 cm/s LV PW:         1.32 cm  LV E/e' lateral: 19.5 LV IVS:        1.14 cm  LV e' medial:    4.46 cm/s LVOT diam:     1.90 cm  LV E/e' medial:  20.4 LV SV:         67 LV SV Index:   39 LVOT Area:     2.84 cm  RIGHT VENTRICLE RV Basal diam:  3.23 cm RV S prime:     16.60 cm/s TAPSE (M-mode): 2.2 cm LEFT ATRIUM             Index       RIGHT ATRIUM           Index LA diam:        3.90 cm 2.27 cm/m  RA Area:     12.80 cm LA Vol (A2C):   25.2 ml 14.68 ml/m RA Volume:   29.60 ml  17.25 ml/m LA Vol (A4C):   37.0 ml 21.56 ml/m LA Biplane Vol: 32.7 ml 19.05 ml/m  AORTIC VALVE                PULMONIC VALVE AV Area (Vmax): 1.90 cm    PV Vmax:       1.17 m/s AV Vmax:        167.00 cm/s PV Peak grad:  5.5 mmHg AV Peak Grad:   11.2 mmHg LVOT Vmax:      112.00 cm/s LVOT Vmean:     64.500 cm/s LVOT VTI:       0.235 m AI PHT:         639 msec  AORTA Ao Root diam: 2.90 cm Ao Asc diam:  2.70 cm MITRAL VALVE                TRICUSPID VALVE MV Area (PHT): 3.42 cm     TV Peak grad:   55.2 mmHg MV Decel Time: 222 msec     TV Vmax:        3.72 m/s MV E velocity: 91.20 cm/s MV A velocity: 127.00 cm/s  SHUNTS MV E/A ratio:  0.72         Systemic VTI:  0.24 m                             Systemic Diam: 1.90 cm Bartholome Bill MD Electronically signed by Bartholome Bill MD Signature Date/Time: 01/03/2020/9:14:55 AM    Final     Assessment & Plan     1.Atrialfib/flutter-discussed with electrophysiology. Has switched toMultaq 400 mg twice daily. Still having episodes of breakthroughwith flutter. Currently in nsr with episodes of breakthrough aflutter with variable block. Wouldcontinueon Multaq 400 bid, cardizemCD 240 mg dialy, and low dose metoprolol following blood pressure and rhythm/rate.apixaban 5 bid. Would ambulate today and plan discharge today or in am. Breakthrough is not prolonged and would continue with this regimen for now. Will have outpatient followup in less than 1 week.   2. Pulmonary fibrosiis-continue with tadalafil 40 mg q hs.   Signed, Javier Docker Ileana Chalupa MD 01/15/2020, 7:15 AM  Pager: [phone]

## 2020-01-15 NOTE — Progress Notes (Signed)
Spoke to patients son on the phone. [name] stated he would be here at 3pm to pick up patient. Educated patient on discharge instructions and patient verbalized understanding.

## 2020-01-15 NOTE — Plan of Care (Signed)
  Problem: Clinical Measurements: Goal: Ability to maintain clinical measurements within normal limits will improve Outcome: Progressing

## 2020-01-15 NOTE — Discharge Summary (Signed)
Physician Discharge Summary  OTA EBERSOLE GMW:[id number] DOB: 1941/02/02 DOA: 12/30/2019  PCP: Kirk Ruths, MD  Admit date: 12/30/2019 Discharge date: 01/21/2020  Admitted From: home Disposition:  home   Recommendations for Outpatient Follow-up:  1. Cardiology plans for a close outpatient follow up   Home Health:  ordered  Discharge Condition:  stable   CODE STATUS:  Full code  Consultations:  Cardiology  Procedures/Studies: . 2 D ECHO 01/02/20 1. Left ventricular ejection fraction, by estimation, is 60 to 65%. The  left ventricle has normal function. The left ventricle has no regional  wall motion abnormalities. There is mild asymmetric left ventricular  hypertrophy of the basal-septal segment.  Left ventricular diastolic parameters are consistent with Grade I  diastolic dysfunction (impaired relaxation).  2. Right ventricular systolic function is normal. The right ventricular  size is normal.  3. Left atrial size was mildly dilated.  4. Right atrial size was mildly dilated.  5. The mitral valve is grossly normal. Trivial mitral valve  regurgitation.  6. The aortic valve is tricuspid. Aortic valve regurgitation is mild. No  aortic stenosis is present.    Discharge Diagnoses:  Principal Problem:   Gastroenteritis Active Problems:   Type 2 diabetes mellitus (HCC)   Anemia, iron deficiency   Adult hypothyroidism   Hypertension associated with diabetes ([place])   PAF (paroxysmal atrial fibrillation) ([place])   Interstitial lung disease ([place])     Brief Summary: Julie Mcmillan is a 79 y.o.femalewith medical history significant forinterstitial lung disease/pulmonary fibrosis,chronic respiratory failure with hypoxia on 4 L supplemental O2 via [place],paroxysmal atrial fibrillation on Eliquis, pulmonary hypertension, diet-controlled type 2 diabetes, hypertension, hypothyroidism, depression/anxiety who presents to the ED for evaluation of persistent nausea,  vomiting, and diarrhea.  She was treated for gastroenteritis. Her GI issues have resolved but she was having A-fib with RVR and cardiology was consulted to help manage this.   Hospital Course:  Principal Problem:   Acute gastroenteritis - presented with nausea, vomiting and diarrhea - CT abd pelvis was unremarkable for an acute etiology - C diff, Gi pathogen panel neg - resolved   Active Problems:     PAF (paroxysmal atrial fibrillation) ([place]) - see ECHO above - she follows with [facility] EP for her paroxysmal A. fib and was on sotalol 80 mg twice daily  [facility] cardiology assisting with management - Sotalol d/c'd and a 3 day wash out period observed prior to [place]- being started- still having some very short bursts of A flutter- asymptomatic - cardiology states that she is stable at this point for d/c - receiving Lopressor, Cardizem and Apixaban as well  Iron deficiency anemia - cont Iron supplements     Interstitial lung disease ([place]) - cont Tadalafil, Azathioprine, Bactrim and Prednisone  Discharge Exam: Vitals:   01/15/20 0945 01/15/20 1226  BP:  (!) 114/51  Pulse: 64 62  Resp:  18  Temp:  97.9 F (36.6 C)  SpO2:  100%   Vitals:   01/15/20 0611 01/15/20 0736 01/15/20 0945 01/15/20 1226  BP: (!) 98/44 (!) 101/44  (!) 114/51  Pulse: (!) 46 (!) 56 64 62  Resp:  17  18  Temp: 98.4 F (36.9 C) 98.4 F (36.9 C)  97.9 F (36.6 C)  TempSrc:  Oral  Oral  SpO2: 100% 100%  100%  Weight:      Height:        General: Pt is alert, awake, not in acute distress  Cardiovascular: RRR, S1/S2 +, no rubs, no gallops Respiratory: CTA bilaterally, no wheezing, no rhonchi Abdominal: Soft, NT, ND, bowel sounds + Extremities: no edema, no cyanosis   Discharge Instructions  Discharge Instructions    Diet - low sodium heart healthy   Complete by: As directed    Discharge instructions   Complete by: As directed    F/U PCP in 2 weeks; F/u Dr. Ubaldo Glassing in 1  week   Increase activity slowly   Complete by: As directed    Increase activity slowly   Complete by: As directed      Allergies as of 01/15/2020      Reactions   Amlodipine Swelling   Levofloxacin Other (See Comments)   Other reaction(s): Other (See Comments) No flouroquinolones while patient on sotalol  Other reaction(s): Other (See Comments) No flouroquinolones while patient on sotalol.  Other reaction(s): Other (See Comments) No flouroquinolones while patient on sotalol    Nexium [esomeprazole Magnesium] Diarrhea   Aspartame Other (See Comments)   Patient reports "self diagnosed intolerance" to artificial sweeteners.      Medication List    STOP taking these medications   diltiazem 30 MG tablet Commonly known as: CARDIZEM   methylPREDNISolone 8 MG tablet Commonly known as: MEDROL   sotalol 80 MG tablet Commonly known as: BETAPACE     TAKE these medications   acetaminophen 500 MG tablet Commonly known as: TYLENOL Take 1,000 mg by mouth at bedtime.   Adcirca 20 MG tablet Generic drug: tadalafil (PAH) Take 40 mg by mouth at bedtime.   azaTHIOprine 50 MG tablet Commonly known as: IMURAN Take 50 mg by mouth daily.   busPIRone 7.5 MG tablet Commonly known as: BUSPAR Take 7.5 mg by mouth 2 (two) times daily.   Calcium 600+D 600-200 MG-UNIT Tabs Generic drug: Calcium Carbonate-Vitamin D Take 1 tablet by mouth daily.   cholestyramine 4 g packet Commonly known as: QUESTRAN Take 4 g by mouth daily before breakfast.   colestipol 1 g tablet Commonly known as: COLESTID Take 2 g by mouth 2 (two) times daily.   diltiazem 240 MG 24 hr capsule Commonly known as: CARDIZEM CD Take 1 capsule (240 mg total) by mouth daily. What changed:   medication strength  how much to take   dronedarone 400 MG tablet Commonly known as: MULTAQ Take 1 tablet (400 mg total) by mouth 2 (two) times daily with a meal.   Eliquis 5 MG Tabs tablet Generic drug: apixaban Take 5 mg  by mouth 2 (two) times daily.   ferrous sulfate 325 (65 FE) MG tablet Take 1 tablet (325 mg total) by mouth daily with breakfast.   FLUoxetine 40 MG capsule Commonly known as: PROZAC Take 40 mg by mouth daily.   furosemide 20 MG tablet Commonly known as: LASIX Take 20 mg by mouth daily.   ibandronate 150 MG tablet Commonly known as: BONIVA Take 150 mg by mouth every 30 (thirty) days.   levothyroxine 75 MCG tablet Commonly known as: SYNTHROID Take 75 mcg by mouth daily before breakfast.   meclizine 25 MG tablet Commonly known as: ANTIVERT Take 25 mg by mouth 3 (three) times daily as needed (vertigo).   metoprolol tartrate 25 MG tablet Commonly known as: LOPRESSOR Take 1 tablet (25 mg total) by mouth 2 (two) times daily.   multivitamin with minerals Tabs tablet Take 1 tablet by mouth daily.   ondansetron 4 MG tablet Commonly known as: ZOFRAN Take 4 mg by mouth every 8 (  eight) hours as needed for nausea.   ondansetron 4 MG tablet Commonly known as: Zofran Take 1 tablet (4 mg total) by mouth daily as needed.   pantoprazole 40 MG tablet Commonly known as: PROTONIX Take 1 tablet (40 mg total) by mouth daily.   potassium chloride SA 20 MEQ tablet Commonly known as: KLOR-CON Take 1 tablet (20 mEq total) by mouth daily.   predniSONE 10 MG tablet Commonly known as: DELTASONE Take 10 mg by mouth daily with breakfast.   sulfamethoxazole-trimethoprim 400-80 MG tablet Commonly known as: BACTRIM Take 1 tablet by mouth daily.   vitamin C 500 MG tablet Commonly known as: ASCORBIC ACID Take 500 mg by mouth daily.   zaleplon 5 MG capsule Commonly known as: SONATA Take 5 mg by mouth at bedtime as needed for sleep.      [place] Follow up.   Specialty: Home Health Services Why: Physical Therapy, Occupational Therapy, Nurse, and Social Worker Contact information: [address] [place]  [address] [phone]        Teodoro Spray, MD Follow up on 01/18/2020.   Specialty: Cardiology Why: 10:00 Contact information: [place] [address] [phone]        Kirk Ruths, MD. Go on 01/19/2020.   Specialty: Internal Medicine Why: Appointment at 1:15pm Contact information: [place] [address] [phone]          Allergies  Allergen Reactions  . Amlodipine Swelling  . Levofloxacin Other (See Comments)    Other reaction(s): Other (See Comments) No flouroquinolones while patient on sotalol  Other reaction(s): Other (See Comments) No flouroquinolones while patient on sotalol.  Other reaction(s): Other (See Comments) No flouroquinolones while patient on sotalol   . Nexium [Esomeprazole Magnesium] Diarrhea  . Aspartame Other (See Comments)    Patient reports "self diagnosed intolerance" to artificial sweeteners.      CT ABDOMEN PELVIS W CONTRAST  Result Date: 12/29/2019 CLINICAL DATA:  Nausea, vomiting and diarrhea since last night, intermittent abdominal pain rated 4/10, history of type II diabetes mellitus, atrial fibrillation, diverticulitis, GERD, hypertension, irritable bowel syndrome, pulmonary hypertension, pulmonary fibrosis EXAM: CT ABDOMEN AND PELVIS WITH CONTRAST TECHNIQUE: Multidetector CT imaging of the abdomen and pelvis was performed using the standard protocol following bolus administration of intravenous contrast. Sagittal and coronal MPR images reconstructed from axial data set. CONTRAST:  148m OMNIPAQUE IOHEXOL 300 MG/ML SOLN IV. No oral contrast. COMPARISON:  09/04/2019 FINDINGS: Lower chest: Bibasilar pulmonary fibrosis unchanged. Hepatobiliary: Contracted gallbladder. Liver unremarkable Pancreas: Hypoechoic nodule at body of pancreas, 8 x 12 mm, grossly unchanged. No additional pancreatic abnormalities. Spleen: Several tiny nonspecific low-attenuation foci within  spleen, nonspecific. Adrenals/Urinary Tract: Adrenal glands normal appearance. Cortical scarring and small cysts within both kidneys. No enhancing renal mass, hydronephrosis or urinary tract calcification. Bladder and ureters unremarkable. Stomach/Bowel: Low lying cecum in pelvis. Appendix surgically absent by history. Distal colonic diverticulosis without evidence of diverticulitis. Moderate-sized hiatal hernia. Vascular/Lymphatic: Scattered atherosclerotic calcifications aorta and iliac arteries without aneurysm. Additional calcified plaques at the origins of the celiac and superior mesenteric arteries. No adenopathy. Reproductive: Uterus surgically absent with nonvisualization of ovaries Other: No free air or free fluid. No acute inflammatory process or hernia identified. Musculoskeletal: Osseous demineralization marked compression fracture of T8 vertebral body unchanged since another CT of 10/26/2016. IMPRESSION: Distal colonic diverticulosis without evidence of diverticulitis. Moderate-sized hiatal  hernia. Stable cystic lesion at body of pancreas 8 x 12 x 14 mm. Stable bibasilar pulmonary fibrosis. Stable marked compression fracture T8 vertebral body. No acute intra-abdominal or intrapelvic abnormalities. Aortic Atherosclerosis (ICD10-I70.0). Electronically Signed   By: Lavonia Dana M.D.   On: 12/29/2019 13:09   DG Chest Port 1 View  Result Date: 01/06/2020 CLINICAL DATA:  Hypoxia. EXAM: PORTABLE CHEST 1 VIEW COMPARISON:  12/29/2019 FINDINGS: Heart size is normal. Chronic fibrotic lung disease peers similar to previous studies. No evidence of acute consolidation, collapse or effusion. Low level inflammatory changes could be hidden within the extensive chronic findings. No bone abnormality. IMPRESSION: Chronic fibrotic lung disease without visible change. Lower level inflammatory changes could be hidden within the extensive chronic findings. Electronically Signed   By: Nelson Chimes M.D.   On: 01/06/2020 12:23    DG Chest Portable 1 View  Result Date: 12/29/2019 CLINICAL DATA:  Pain with nausea and vomiting EXAM: PORTABLE CHEST 1 VIEW COMPARISON:  August 10, 2019 FINDINGS: There is extensive fibrosis throughout the lungs bilaterally. There is no frank edema or consolidation. Heart is upper normal in size with pulmonary vascularity normal. No adenopathy. No bone lesions. There is aortic atherosclerosis. IMPRESSION: Extensive fibrosis throughout the lungs, stable. No edema or airspace opacity. Stable cardiac silhouette. No adenopathy. Aortic Atherosclerosis (ICD10-I70.0). Electronically Signed   By: Lowella Grip III M.D.   On: 12/29/2019 13:31   ECHOCARDIOGRAM COMPLETE  Result Date: 01/03/2020    ECHOCARDIOGRAM REPORT   Patient Name:   MURIAL BEAM Date of Exam: 01/02/2020 Medical Rec #:  [id number]        Height:       64.0 in Accession #:    [id number]       Weight:       147.0 lb Date of Birth:  1941-09-28         BSA:          1.716 m Patient Age:    89 years         BP:           144/61 mmHg Patient Gender: F                HR:           66 bpm. Exam Location:  [facility] Procedure: 2D Echo Indications:     Abnormal ECG 794.31/ R94.31  History:         Patient has prior history of Echocardiogram examinations, most                  recent 03/26/2015.  Sonographer:     Arville Go RDCS Referring Phys:  [id number] [place] Diagnosing Phys: Bartholome Bill MD IMPRESSIONS  1. Left ventricular ejection fraction, by estimation, is 60 to 65%. The left ventricle has normal function. The left ventricle has no regional wall motion abnormalities. There is mild asymmetric left ventricular hypertrophy of the basal-septal segment. Left ventricular diastolic parameters are consistent with Grade I diastolic dysfunction (impaired relaxation).  2. Right ventricular systolic function is normal. The right ventricular size is normal.  3. Left atrial size was mildly dilated.  4. Right atrial size was mildly dilated.  5. The mitral  valve is grossly normal. Trivial mitral valve regurgitation.  6. The aortic valve is tricuspid. Aortic valve regurgitation is mild. No aortic stenosis is present. FINDINGS  Left Ventricle: Left ventricular ejection fraction, by estimation, is 60 to 65%. The left ventricle has normal function. The  left ventricle has no regional wall motion abnormalities. The left ventricular internal cavity size was normal in size. There is  mild asymmetric left ventricular hypertrophy of the basal-septal segment. Left ventricular diastolic parameters are consistent with Grade I diastolic dysfunction (impaired relaxation). Right Ventricle: The right ventricular size is normal. No increase in right ventricular wall thickness. Right ventricular systolic function is normal. Left Atrium: Left atrial size was mildly dilated. Right Atrium: Right atrial size was mildly dilated. Pericardium: There is no evidence of pericardial effusion. Mitral Valve: The mitral valve is grossly normal. Trivial mitral valve regurgitation. Tricuspid Valve: The tricuspid valve is normal in structure. Tricuspid valve regurgitation is mild. Aortic Valve: The aortic valve is tricuspid. Aortic valve regurgitation is mild. Aortic regurgitation PHT measures 639 msec. No aortic stenosis is present. Aortic valve peak gradient measures 11.2 mmHg. Pulmonic Valve: The pulmonic valve was grossly normal. Pulmonic valve regurgitation is trivial. Aorta: The aortic root is normal in size and structure. IAS/Shunts: The interatrial septum was not assessed.  LEFT VENTRICLE PLAX 2D LVIDd:         4.31 cm  Diastology LVIDs:         2.89 cm  LV e' lateral:   4.68 cm/s LV PW:         1.32 cm  LV E/e' lateral: 19.5 LV IVS:        1.14 cm  LV e' medial:    4.46 cm/s LVOT diam:     1.90 cm  LV E/e' medial:  20.4 LV SV:         67 LV SV Index:   39 LVOT Area:     2.84 cm  RIGHT VENTRICLE RV Basal diam:  3.23 cm RV S prime:     16.60 cm/s TAPSE (M-mode): 2.2 cm LEFT ATRIUM              Index       RIGHT ATRIUM           Index LA diam:        3.90 cm 2.27 cm/m  RA Area:     12.80 cm LA Vol (A2C):   25.2 ml 14.68 ml/m RA Volume:   29.60 ml  17.25 ml/m LA Vol (A4C):   37.0 ml 21.56 ml/m LA Biplane Vol: 32.7 ml 19.05 ml/m  AORTIC VALVE                PULMONIC VALVE AV Area (Vmax): 1.90 cm    PV Vmax:       1.17 m/s AV Vmax:        167.00 cm/s PV Peak grad:  5.5 mmHg AV Peak Grad:   11.2 mmHg LVOT Vmax:      112.00 cm/s LVOT Vmean:     64.500 cm/s LVOT VTI:       0.235 m AI PHT:         639 msec  AORTA Ao Root diam: 2.90 cm Ao Asc diam:  2.70 cm MITRAL VALVE                TRICUSPID VALVE MV Area (PHT): 3.42 cm     TV Peak grad:   55.2 mmHg MV Decel Time: 222 msec     TV Vmax:        3.72 m/s MV E velocity: 91.20 cm/s MV A velocity: 127.00 cm/s  SHUNTS MV E/A ratio:  0.72         Systemic VTI:  0.24 m  Systemic Diam: 1.90 cm Bartholome Bill MD Electronically signed by Bartholome Bill MD Signature Date/Time: 01/03/2020/9:14:55 AM    Final      The results of significant diagnostics from this hospitalization (including imaging, microbiology, ancillary and laboratory) are listed below for reference.     Microbiology: No results found for this or any previous visit (from the past 240 hour(s)).   Labs: BNP (last 3 results) Recent Labs    01/02/20 0502  BNP 037.5*   Basic Metabolic Panel: No results for input(s): NA, K, CL, CO2, GLUCOSE, BUN, CREATININE, CALCIUM, MG, PHOS in the last 168 hours. Liver Function Tests: No results for input(s): AST, ALT, ALKPHOS, BILITOT, PROT, ALBUMIN in the last 168 hours. No results for input(s): LIPASE, AMYLASE in the last 168 hours. No results for input(s): AMMONIA in the last 168 hours. CBC: No results for input(s): WBC, NEUTROABS, HGB, HCT, MCV, PLT in the last 168 hours. Cardiac Enzymes: No results for input(s): CKTOTAL, CKMB, CKMBINDEX, TROPONINI in the last 168 hours. BNP: Invalid input(s): POCBNP CBG: No results  for input(s): GLUCAP in the last 168 hours. D-Dimer No results for input(s): DDIMER in the last 72 hours. Hgb A1c No results for input(s): HGBA1C in the last 72 hours. Lipid Profile No results for input(s): CHOL, HDL, LDLCALC, TRIG, CHOLHDL, LDLDIRECT in the last 72 hours. Thyroid function studies No results for input(s): TSH, T4TOTAL, T3FREE, THYROIDAB in the last 72 hours.  Invalid input(s): FREET3 Anemia work up No results for input(s): VITAMINB12, FOLATE, FERRITIN, TIBC, IRON, RETICCTPCT in the last 72 hours. Urinalysis    Component Value Date/Time   COLORURINE YELLOW (A) 12/31/2019 0029   APPEARANCEUR HAZY (A) 12/31/2019 0029   APPEARANCEUR Clear 11/04/2018 1531   LABSPEC 1.020 12/31/2019 0029   PHURINE 5.0 12/31/2019 0029   GLUCOSEU NEGATIVE 12/31/2019 0029   HGBUR NEGATIVE 12/31/2019 0029   BILIRUBINUR NEGATIVE 12/31/2019 0029   BILIRUBINUR Negative 11/04/2018 1531   KETONESUR NEGATIVE 12/31/2019 0029   PROTEINUR 30 (A) 12/31/2019 0029   NITRITE NEGATIVE 12/31/2019 0029   LEUKOCYTESUR MODERATE (A) 12/31/2019 0029   Sepsis Labs Invalid input(s): PROCALCITONIN,  WBC,  LACTICIDVEN Microbiology No results found for this or any previous visit (from the past 240 hour(s)).   Time coordinating discharge in minutes: 65  SIGNED:   Debbe Odea, MD  [facility] 01/21/2020, 3:57 PM

## 2020-01-15 NOTE — TOC Progression Note (Signed)
Transition of Care [facility]) - Progression Note    Patient Details  Name: Julie Mcmillan MRN: [id number] Date of Birth: [birth year]  Transition of Care [facility]) CM/SW [place], RN Phone Number: 01/15/2020, 8:59 AM  Clinical Narrative:    Spoke with Annamary Rummage, son.  Told him that their chose SNF declined a bed to their mother at this time and to expect discharge on Friday.  I also educated about resources in the home as far as private pay caregivers that can come in daily or three times a week to take care of personal things, such as meals, companionship, light cleaning, medicine, etc.  Patient will be discharged with [facility] services when she is cleared for discharge.    Expected Discharge Plan: Assisted Living Barriers to Discharge: No Barriers Identified  Expected Discharge Plan and Services Expected Discharge Plan: Assisted Living In-house Referral: Clinical Social Work Discharge Planning Services: CM Consult   Living arrangements for the past 2 months: Apartment Expected Discharge Date: 01/10/20                         HH Arranged: PT, OT, RN, Social Work [employer] Agency: [facility] Date [place]: 01/09/20 Time [place]: 3750 Representative spoke with at [place]: [place]   Social Determinants of Health ([place]) Interventions    Readmission Risk Interventions No flowsheet data found.

## 2020-01-18 MED ORDER — BL TUSSIN CF 30-10-100 MG/5ML PO SYRP
0.25 | ORAL_SOLUTION | ORAL | Status: DC
Start: ? — End: 2020-01-18

## 2020-01-18 MED ORDER — PANTOPRAZOLE SODIUM 40 MG PO TBEC
40.00 | DELAYED_RELEASE_TABLET | ORAL | Status: DC
Start: 2020-01-21 — End: 2020-01-18

## 2020-01-18 MED ORDER — POTASSIUM IODIDE 32.5 (24 I) MG PO TABS
40.00 | ORAL_TABLET | ORAL | Status: DC
Start: 2020-01-19 — End: 2020-01-18

## 2020-01-18 MED ORDER — METOPROLOL TARTRATE 25 MG PO TABS
25.00 | ORAL_TABLET | ORAL | Status: DC
Start: 2020-01-18 — End: 2020-01-18

## 2020-01-18 MED ORDER — TICARCILLIN DISODIUM 3 GM IJ SOLR
5.00 | INTRAMUSCULAR | Status: DC
Start: 2020-01-18 — End: 2020-01-18

## 2020-01-18 MED ORDER — Medication
40.00 | Status: DC
Start: 2020-01-19 — End: 2020-01-18

## 2020-01-18 MED ORDER — CALCIUM CARBONATE-VITAMIN D 600-400 MG-UNIT PO TABS
2.00 | ORAL_TABLET | ORAL | Status: DC
Start: 2020-01-21 — End: 2020-01-18

## 2020-01-18 MED ORDER — CLOBETA OINTMENT EX
250.00 | CUTANEOUS | Status: DC
Start: 2020-01-19 — End: 2020-01-18

## 2020-01-18 MED ORDER — TUSSI PRES-B 2-15-200 MG/5ML PO LIQD
0.50 | ORAL | Status: DC
Start: ? — End: 2020-01-18

## 2020-01-18 MED ORDER — PREDNISONE 10 MG PO TABS
10.00 | ORAL_TABLET | ORAL | Status: DC
Start: 2020-01-21 — End: 2020-01-18

## 2020-01-18 MED ORDER — DILTIAZEM HCL 30 MG PO TABS
45.00 | ORAL_TABLET | ORAL | Status: DC
Start: 2020-01-18 — End: 2020-01-18

## 2020-01-18 MED ORDER — APIXABAN 5 MG PO TABS
5.00 | ORAL_TABLET | ORAL | Status: DC
Start: 2020-01-20 — End: 2020-01-18

## 2020-01-18 MED ORDER — DRONEDARONE HCL 400 MG PO TABS
400.00 | ORAL_TABLET | ORAL | Status: DC
Start: 2020-01-21 — End: 2020-01-18

## 2020-01-18 MED ORDER — PKU 1 PO POWD
7.50 | ORAL | Status: DC
Start: 2020-01-18 — End: 2020-01-18

## 2020-01-18 MED ORDER — LEVOTHYROXINE SODIUM 75 MCG PO TABS
75.00 | ORAL_TABLET | ORAL | Status: DC
Start: 2020-01-21 — End: 2020-01-18

## 2020-01-21 MED ORDER — DILTIAZEM HCL 60 MG PO TABS
60.00 | ORAL_TABLET | ORAL | Status: DC
Start: 2020-01-21 — End: 2020-01-21

## 2020-02-11 ENCOUNTER — Emergency Department: Payer: Medicare HMO

## 2020-02-11 ENCOUNTER — Inpatient Hospital Stay
Admission: EM | Admit: 2020-02-11 | Discharge: 2020-02-14 | DRG: 309 | Disposition: A | Discharge status: Other Acute Inpatient Hospital | Payer: Medicare HMO | Attending: Internal Medicine | Admitting: Internal Medicine

## 2020-02-11 ENCOUNTER — Encounter: Payer: Self-pay | Admitting: Emergency Medicine

## 2020-02-11 ENCOUNTER — Other Ambulatory Visit: Payer: Self-pay

## 2020-02-11 DIAGNOSIS — I4891 Unspecified atrial fibrillation: Secondary | ICD-10-CM | POA: Diagnosis present

## 2020-02-11 DIAGNOSIS — I480 Paroxysmal atrial fibrillation: Secondary | ICD-10-CM | POA: Diagnosis not present

## 2020-02-11 DIAGNOSIS — E119 Type 2 diabetes mellitus without complications: Secondary | ICD-10-CM | POA: Diagnosis not present

## 2020-02-11 DIAGNOSIS — Z9049 Acquired absence of other specified parts of digestive tract: Secondary | ICD-10-CM

## 2020-02-11 DIAGNOSIS — Z90710 Acquired absence of both cervix and uterus: Secondary | ICD-10-CM

## 2020-02-11 DIAGNOSIS — R042 Hemoptysis: Secondary | ICD-10-CM | POA: Diagnosis not present

## 2020-02-11 DIAGNOSIS — I272 Pulmonary hypertension, unspecified: Secondary | ICD-10-CM | POA: Diagnosis present

## 2020-02-11 DIAGNOSIS — K219 Gastro-esophageal reflux disease without esophagitis: Secondary | ICD-10-CM | POA: Diagnosis present

## 2020-02-11 DIAGNOSIS — K920 Hematemesis: Secondary | ICD-10-CM | POA: Diagnosis not present

## 2020-02-11 DIAGNOSIS — Z833 Family history of diabetes mellitus: Secondary | ICD-10-CM

## 2020-02-11 DIAGNOSIS — J984 Other disorders of lung: Secondary | ICD-10-CM | POA: Diagnosis present

## 2020-02-11 DIAGNOSIS — Z7901 Long term (current) use of anticoagulants: Secondary | ICD-10-CM

## 2020-02-11 DIAGNOSIS — I4892 Unspecified atrial flutter: Secondary | ICD-10-CM | POA: Diagnosis present

## 2020-02-11 DIAGNOSIS — I10 Essential (primary) hypertension: Secondary | ICD-10-CM | POA: Diagnosis present

## 2020-02-11 DIAGNOSIS — F410 Panic disorder [episodic paroxysmal anxiety] without agoraphobia: Secondary | ICD-10-CM | POA: Diagnosis present

## 2020-02-11 DIAGNOSIS — Z20822 Contact with and (suspected) exposure to covid-19: Secondary | ICD-10-CM | POA: Diagnosis present

## 2020-02-11 DIAGNOSIS — J9611 Chronic respiratory failure with hypoxia: Secondary | ICD-10-CM | POA: Diagnosis present

## 2020-02-11 DIAGNOSIS — Z79899 Other long term (current) drug therapy: Secondary | ICD-10-CM

## 2020-02-11 DIAGNOSIS — R040 Epistaxis: Secondary | ICD-10-CM | POA: Diagnosis not present

## 2020-02-11 DIAGNOSIS — Z79890 Hormone replacement therapy (postmenopausal): Secondary | ICD-10-CM

## 2020-02-11 DIAGNOSIS — J8410 Pulmonary fibrosis, unspecified: Secondary | ICD-10-CM | POA: Diagnosis present

## 2020-02-11 DIAGNOSIS — Z8249 Family history of ischemic heart disease and other diseases of the circulatory system: Secondary | ICD-10-CM

## 2020-02-11 DIAGNOSIS — F329 Major depressive disorder, single episode, unspecified: Secondary | ICD-10-CM | POA: Diagnosis present

## 2020-02-11 DIAGNOSIS — R109 Unspecified abdominal pain: Secondary | ICD-10-CM

## 2020-02-11 DIAGNOSIS — Z7952 Long term (current) use of systemic steroids: Secondary | ICD-10-CM

## 2020-02-11 LAB — LIPASE, BLOOD: Lipase: 23 U/L (ref 11–51)

## 2020-02-11 LAB — CBC WITH DIFFERENTIAL/PLATELET
Abs Immature Granulocytes: 0.11 10*3/uL — ABNORMAL HIGH (ref 0.00–0.07)
Basophils Absolute: 0.1 10*3/uL (ref 0.0–0.1)
Basophils Relative: 1 %
Eosinophils Absolute: 0.3 10*3/uL (ref 0.0–0.5)
Eosinophils Relative: 3 %
HCT: 37.3 % (ref 36.0–46.0)
Hemoglobin: 11.4 g/dL — ABNORMAL LOW (ref 12.0–15.0)
Immature Granulocytes: 1 %
Lymphocytes Relative: 6 %
Lymphs Abs: 0.6 10*3/uL — ABNORMAL LOW (ref 0.7–4.0)
MCH: 27.1 pg (ref 26.0–34.0)
MCHC: 30.6 g/dL (ref 30.0–36.0)
MCV: 88.6 fL (ref 80.0–100.0)
Monocytes Absolute: 1.2 10*3/uL — ABNORMAL HIGH (ref 0.1–1.0)
Monocytes Relative: 12 %
Neutro Abs: 7.4 10*3/uL (ref 1.7–7.7)
Neutrophils Relative %: 77 %
Platelets: 195 10*3/uL (ref 150–400)
RBC: 4.21 MIL/uL (ref 3.87–5.11)
RDW: 21.9 % — ABNORMAL HIGH (ref 11.5–15.5)
Smear Review: NORMAL
WBC: 9.7 10*3/uL (ref 4.0–10.5)
nRBC: 0 % (ref 0.0–0.2)

## 2020-02-11 LAB — LIPID PANEL
Cholesterol: 156 mg/dL (ref 0–200)
HDL: 31 mg/dL — ABNORMAL LOW (ref >40)
LDL Cholesterol: 100 mg/dL — ABNORMAL HIGH (ref 0–99)
Total CHOL/HDL Ratio: 5 RATIO
Triglycerides: 127 mg/dL (ref <150)
VLDL: 25 mg/dL (ref 0–40)

## 2020-02-11 LAB — TROPONIN I (HIGH SENSITIVITY)
Troponin I (High Sensitivity): 8 ng/L (ref <18)
Troponin I (High Sensitivity): 9 ng/L (ref <18)
Troponin I (High Sensitivity): 11 ng/L (ref <18)
Troponin I (High Sensitivity): 10 ng/L (ref <18)

## 2020-02-11 LAB — SARS CORONAVIRUS 2 BY RT PCR (HOSPITAL ORDER, PERFORMED IN ~~LOC~~ HOSPITAL LAB): SARS Coronavirus 2: NEGATIVE

## 2020-02-11 LAB — MAGNESIUM
Magnesium: 1.8 mg/dL (ref 1.7–2.4)
Magnesium: 1.9 mg/dL (ref 1.7–2.4)

## 2020-02-11 LAB — COMPREHENSIVE METABOLIC PANEL
ALT: 59 U/L — ABNORMAL HIGH (ref 0–44)
AST: 74 U/L — ABNORMAL HIGH (ref 15–41)
Albumin: 3.1 g/dL — ABNORMAL LOW (ref 3.5–5.0)
Alkaline Phosphatase: 110 U/L (ref 38–126)
Anion gap: 8 (ref 5–15)
BUN: 17 mg/dL (ref 8–23)
CO2: 23 mmol/L (ref 22–32)
Calcium: 8 mg/dL — ABNORMAL LOW (ref 8.9–10.3)
Chloride: 105 mmol/L (ref 98–111)
Creatinine, Ser: 1.14 mg/dL — ABNORMAL HIGH (ref 0.44–1.00)
GFR calc Af Amer: 53 mL/min — ABNORMAL LOW (ref >60)
GFR calc non Af Amer: 45 mL/min — ABNORMAL LOW (ref >60)
Glucose, Bld: 199 mg/dL — ABNORMAL HIGH (ref 70–99)
Potassium: 3.5 mmol/L (ref 3.5–5.1)
Sodium: 136 mmol/L (ref 135–145)
Total Bilirubin: 1.1 mg/dL (ref 0.3–1.2)
Total Protein: 5.4 g/dL — ABNORMAL LOW (ref 6.5–8.1)

## 2020-02-11 LAB — HEMOGLOBIN A1C
Hgb A1c MFr Bld: 5 % (ref 4.8–5.6)
Mean Plasma Glucose: 96.8 mg/dL

## 2020-02-11 LAB — TSH: TSH: 2.995 u[IU]/mL (ref 0.350–4.500)

## 2020-02-11 MED ORDER — BUSPIRONE HCL 15 MG PO TABS
7.5000 mg | ORAL_TABLET | Freq: Two times a day (BID) | ORAL | Status: DC
  Administered 2020-02-11 – 2020-02-12 (×2): 7.5 mg via ORAL
  Filled 2020-02-11 (×3): qty 1

## 2020-02-11 MED ORDER — METOPROLOL TARTRATE 5 MG/5ML IV SOLN
INTRAVENOUS | Status: AC
  Filled 2020-02-11: qty 10

## 2020-02-11 MED ORDER — AZATHIOPRINE 50 MG PO TABS
50.0000 mg | ORAL_TABLET | Freq: Every day | ORAL | Status: DC
  Administered 2020-02-12: 50 mg via ORAL
  Filled 2020-02-11 (×4): qty 1

## 2020-02-11 MED ORDER — FERROUS SULFATE 325 (65 FE) MG PO TABS
325.0000 mg | ORAL_TABLET | Freq: Every day | ORAL | Status: DC
  Administered 2020-02-12: 325 mg via ORAL
  Filled 2020-02-11 (×2): qty 1

## 2020-02-11 MED ORDER — FLUOXETINE HCL 20 MG PO CAPS
40.0000 mg | ORAL_CAPSULE | Freq: Every day | ORAL | Status: DC
  Administered 2020-02-12: 40 mg via ORAL
  Filled 2020-02-11 (×3): qty 2

## 2020-02-11 MED ORDER — ACETAMINOPHEN 325 MG PO TABS: 650.0000 mg | ORAL_TABLET | ORAL | Status: DC | PRN

## 2020-02-11 MED ORDER — COLESTIPOL HCL 1 G PO TABS
2.0000 g | ORAL_TABLET | Freq: Two times a day (BID) | ORAL | Status: DC
  Administered 2020-02-11 – 2020-02-12 (×2): 2 g via ORAL
  Filled 2020-02-11 (×7): qty 2

## 2020-02-11 MED ORDER — MECLIZINE HCL 25 MG PO TABS
25.0000 mg | ORAL_TABLET | Freq: Three times a day (TID) | ORAL | Status: DC | PRN
  Filled 2020-02-11: qty 1

## 2020-02-11 MED ORDER — ACETAMINOPHEN 500 MG PO TABS
1000.0000 mg | ORAL_TABLET | Freq: Every day | ORAL | Status: DC
  Administered 2020-02-11: 1000 mg via ORAL
  Filled 2020-02-11 (×2): qty 2

## 2020-02-11 MED ORDER — METOPROLOL TARTRATE 5 MG/5ML IV SOLN
2.5000 mg | INTRAVENOUS | Status: DC | PRN
  Administered 2020-02-11: 2.5 mg via INTRAVENOUS

## 2020-02-11 MED ORDER — ADULT MULTIVITAMIN W/MINERALS CH
1.0000 | ORAL_TABLET | Freq: Every day | ORAL | Status: DC
  Administered 2020-02-12: 1 via ORAL
  Filled 2020-02-11: qty 1

## 2020-02-11 MED ORDER — SODIUM CHLORIDE 0.9 % IV BOLUS
500.0000 mL | Freq: Once | INTRAVENOUS | Status: AC
  Administered 2020-02-11: 500 mL via INTRAVENOUS

## 2020-02-11 MED ORDER — DIGOXIN 125 MCG PO TABS: 0.1250 mg | ORAL_TABLET | Freq: Every day | ORAL | Status: DC

## 2020-02-11 MED ORDER — ZOLPIDEM TARTRATE 5 MG PO TABS: 5.0000 mg | ORAL_TABLET | Freq: Every evening | ORAL | Status: DC | PRN

## 2020-02-11 MED ORDER — DILTIAZEM HCL-DEXTROSE 125-5 MG/125ML-% IV SOLN (PREMIX)
5.0000 mg/h | INTRAVENOUS | Status: DC
  Administered 2020-02-11: 5 mg/h via INTRAVENOUS

## 2020-02-11 MED ORDER — LEVOTHYROXINE SODIUM 50 MCG PO TABS
75.0000 ug | ORAL_TABLET | Freq: Every day | ORAL | Status: DC
  Administered 2020-02-12: 75 ug via ORAL
  Filled 2020-02-11: qty 1

## 2020-02-11 MED ORDER — CHOLESTYRAMINE 4 G PO PACK
4.0000 g | PACK | Freq: Every day | ORAL | Status: DC
  Filled 2020-02-11 (×4): qty 1

## 2020-02-11 MED ORDER — TADALAFIL (PAH) 20 MG PO TABS
40.0000 mg | ORAL_TABLET | Freq: Every day | ORAL | Status: DC
  Administered 2020-02-11 – 2020-02-13 (×2): 40 mg via ORAL
  Filled 2020-02-11 (×4): qty 2

## 2020-02-11 MED ORDER — MAGNESIUM OXIDE 400 MG PO TABS
400.0000 mg | ORAL_TABLET | Freq: Every day | ORAL | Status: DC
  Administered 2020-02-11 – 2020-02-12 (×2): 400 mg via ORAL
  Filled 2020-02-11 (×4): qty 1

## 2020-02-11 MED ORDER — IBANDRONATE SODIUM 150 MG PO TABS: 150.0000 mg | ORAL_TABLET | ORAL | Status: DC

## 2020-02-11 MED ORDER — CALCIUM CARBONATE-VITAMIN D 500-200 MG-UNIT PO TABS
1.0000 | ORAL_TABLET | Freq: Every day | ORAL | Status: DC
  Administered 2020-02-11 – 2020-02-12 (×2): 1 via ORAL
  Filled 2020-02-11 (×2): qty 1

## 2020-02-11 MED ORDER — POTASSIUM CHLORIDE CRYS ER 20 MEQ PO TBCR
20.0000 meq | EXTENDED_RELEASE_TABLET | Freq: Every day | ORAL | Status: DC
  Administered 2020-02-12: 20 meq via ORAL
  Filled 2020-02-11: qty 1

## 2020-02-11 MED ORDER — DILTIAZEM HCL 25 MG/5ML IV SOLN
15.0000 mg | Freq: Once | INTRAVENOUS | Status: AC
  Administered 2020-02-11: 15 mg via INTRAVENOUS
  Filled 2020-02-11: qty 5

## 2020-02-11 MED ORDER — ALPRAZOLAM 0.25 MG PO TABS
0.2500 mg | ORAL_TABLET | Freq: Two times a day (BID) | ORAL | Status: DC | PRN
  Administered 2020-02-12: 0.25 mg via ORAL
  Filled 2020-02-11: qty 1

## 2020-02-11 MED ORDER — METOPROLOL TARTRATE 25 MG PO TABS: 25.0000 mg | ORAL_TABLET | Freq: Two times a day (BID) | ORAL | Status: DC

## 2020-02-11 MED ORDER — DILTIAZEM HCL ER COATED BEADS 240 MG PO CP24
240.0000 mg | ORAL_CAPSULE | Freq: Every day | ORAL | Status: DC
  Administered 2020-02-11: 240 mg via ORAL
  Filled 2020-02-11 (×2): qty 1

## 2020-02-11 MED ORDER — FUROSEMIDE 40 MG PO TABS: 20.0000 mg | ORAL_TABLET | Freq: Every day | ORAL | Status: DC

## 2020-02-11 MED ORDER — METOPROLOL TARTRATE 5 MG/5ML IV SOLN
2.5000 mg | INTRAVENOUS | Status: AC | PRN
  Administered 2020-02-11 (×2): 2.5 mg via INTRAVENOUS
  Filled 2020-02-11: qty 5

## 2020-02-11 MED ORDER — ONDANSETRON HCL 4 MG/2ML IJ SOLN
4.0000 mg | Freq: Four times a day (QID) | INTRAMUSCULAR | Status: DC | PRN
  Administered 2020-02-12: 4 mg via INTRAVENOUS
  Filled 2020-02-11: qty 2

## 2020-02-11 MED ORDER — APIXABAN 5 MG PO TABS
5.0000 mg | ORAL_TABLET | Freq: Two times a day (BID) | ORAL | Status: DC
  Administered 2020-02-11 – 2020-02-12 (×3): 5 mg via ORAL
  Filled 2020-02-11 (×3): qty 1

## 2020-02-11 MED ORDER — DRONEDARONE HCL 400 MG PO TABS
400.0000 mg | ORAL_TABLET | Freq: Two times a day (BID) | ORAL | Status: DC
  Administered 2020-02-11 – 2020-02-13 (×4): 400 mg via ORAL
  Filled 2020-02-11 (×9): qty 1

## 2020-02-11 NOTE — ED Notes (Signed)
Report given for transfer of care.  PT updated on plan of care/verbalized understanding.  Unable to give 3rd dose of metoprolol secondary to systolic bp of 87/68.  PT sitting upright in bed asymptomatic eating dinner meal provided per ordres.

## 2020-02-11 NOTE — ED Triage Notes (Signed)
Pt presents from home via acems with c/o chest discomfort. Pt with atrial fibrillation with RVR when ems arrived on scene. Pt given 70m metoprolol via ems. Pt now sinus rhythm with rate of 93bpm. Pt currently alert and oriented x4. Pt chronically on 4L at home.

## 2020-02-11 NOTE — ED Notes (Signed)
Dr. Manuella Ghazi made aware of patient condition and rhythm

## 2020-02-11 NOTE — ED Notes (Signed)
After first dose of 2.79m metoprolol pt convereted to a-flutter RVR, EKG shot and sent.  No acute changes in pt condition otherwise.  Remains gcs 15, asymptomatic.  Denies chest pain, sob or other associated symptoms.

## 2020-02-11 NOTE — Progress Notes (Signed)
PHARMACIST - PHYSICIAN COMMUNICATION  CONCERNING: P&T Medication Policy Regarding Oral Bisphosphonates  RECOMMENDATION: Your order for ibandronate (Boniva)has been discontinued at this time.  If the patient's post-hospital medical condition warrants safe use of this class of drugs, please resume the pre-hospital regimen upon discharge.  DESCRIPTION:  Alendronate (Fosamax), ibandronate (Boniva), and risedronate (Actonel) can cause severe esophageal erosions in patients who are unable to remain upright at least 30 minutes after taking this medication.   Since brief interruptions in therapy are thought to have minimal impact on bone mineral density, the [facility] has established that bisphosphonate orders should be routinely discontinued during hospitalization.   To override this safety policy and permit administration of Boniva, Fosamax, or Actonel in the hospital, prescribers must write "DO NOT HOLD" in the comments section when placing the order for this class of medications.

## 2020-02-11 NOTE — Consult Note (Signed)
CARDIOLOGY CONSULT NOTE               Patient ID: Julie Mcmillan MRN: [id number] DOB/AGE: 1941/03/10 79 y.o.  Admit date: 02/11/2020 Referring Physician Dr. Max Sane hospitalist Primary Physician Frazier Richards primary Primary Cardiologist [facility] Reason for Consultation narrow complex supraventricular tachycardia probably atrial flutter  HPI: Patient is a 79 year old female with paroxysmal atrial fibrillation atrial flutter GERD hypertension pulmonary hypertension pulmonary fibrosis diabetes patient presents for further evaluation complains of some dyspnea and chest pain.  Has been on Eliquis for anticoagulation and Multaq for antiarrhythmic.  When the patient came to emergency room patient was placed on Cardizem she had intermittent tachycardia with normal sinus rhythm blood pressure has been borderline denies any blackout spells or syncope because of difficulty control tachycardia patient advised to be admitted.  Patient recently had been admitted to [facility] and had work-up and treatment now is back with recurrent symptoms  Review of systems complete and found to be negative unless listed above     Past Medical History:  Diagnosis Date  . A-fib ([place])   . Cancer ([place])    skin  . Chronic diarrhea   . Diverticulitis   . GERD (gastroesophageal reflux disease)    usually takes protonix  . Hip fracture ([place]) 11/2015   hairline fracture on right.  no surgery, just physical therapy  . Hypertension   . IBS (irritable bowel syndrome)   . IBS (irritable bowel syndrome)   . Interstitial lung disease ([place])    does not remember when  . Pulmonary fibrosis ([place])   . Pulmonary hypertension ([place])   . Seasonal affective disorder ([place])   . Shortness of breath dyspnea    with exertion  . Type 2 diabetes mellitus ([place]) 11/04/2014   pt states this has resolved with diet/exercise 11/07/18    Past Surgical History:  Procedure Laterality Date  . ABDOMINAL HYSTERECTOMY     partial   . APPENDECTOMY    . BREAST BIOPSY Left    core bx- neg  . CATARACT EXTRACTION W/PHACO Right 03/16/2015   Procedure: CATARACT EXTRACTION PHACO AND INTRAOCULAR LENS PLACEMENT (IOC);  Surgeon: Leandrew Koyanagi, MD;  Location: [place];  Service: Ophthalmology;  Laterality: Right;  . CATARACT EXTRACTION W/PHACO Left 06/13/2016   Procedure: CATARACT EXTRACTION PHACO AND INTRAOCULAR LENS PLACEMENT (IOC);  Surgeon: Leandrew Koyanagi, MD;  Location: [place];  Service: Ophthalmology;  Laterality: Left;  pre-diabetic - diet controlled  . COLONOSCOPY    . COLONOSCOPY WITH PROPOFOL N/A 05/25/2016   Procedure: COLONOSCOPY WITH PROPOFOL;  Surgeon: Manya Silvas, MD;  Location: [facility] ENDOSCOPY;  Service: Endoscopy;  Laterality: N/A;  . ESOPHAGOGASTRODUODENOSCOPY (EGD) WITH PROPOFOL N/A 05/25/2016   Procedure: ESOPHAGOGASTRODUODENOSCOPY (EGD) WITH PROPOFOL;  Surgeon: Manya Silvas, MD;  Location: [facility] ENDOSCOPY;  Service: Endoscopy;  Laterality: N/A;  . NISSEN FUNDOPLICATION  6644   came apart in 2016 and ibs and reflux returned  . OPEN REDUCTION INTERNAL FIXATION (ORIF) DISTAL RADIAL FRACTURE Right 01/13/2016   Procedure: OPEN REDUCTION INTERNAL FIXATION (ORIF) DISTAL RADIAL FRACTURE;  Surgeon: Hessie Knows, MD;  Location: [facility] ORS;  Service: Orthopedics;  Laterality: Right;  . TONSILLECTOMY     and adenoidectomy    Medications Prior to Admission  Medication Sig Dispense Refill Last Dose  . acetaminophen (TYLENOL) 500 MG tablet Take 1,000 mg by mouth at bedtime.     . busPIRone (BUSPAR) 7.5 MG tablet Take 7.5 mg by mouth 2 (two) times  daily.   02/10/2020 at Unknown time  . Calcium Carbonate-Vitamin D (CALCIUM 600+D) 600-200 MG-UNIT TABS Take 1 tablet by mouth daily.     . cephALEXin (KEFLEX) 250 MG capsule Take 250 mg by mouth daily.   02/10/2020 at Unknown  . colestipol (COLESTID) 1 g tablet Take 2 g by mouth 2 (two) times daily before a meal.      . diltiazem (CARDIZEM) 30 MG  tablet Take 30 mg by mouth daily as needed (prolonged palpatations).    Unknown at PRN  . diltiazem (TIAZAC) 180 MG 24 hr capsule Take 180 mg by mouth daily.   02/10/2020 at Unknown time  . dronedarone (MULTAQ) 400 MG tablet Take 1 tablet (400 mg total) by mouth 2 (two) times daily with a meal. 60 tablet 0   . ELIQUIS 5 MG TABS tablet Take 5 mg by mouth 2 (two) times daily.   02/10/2020 at 1900  . ferrous sulfate 325 (65 FE) MG tablet Take 1 tablet (325 mg total) by mouth daily with breakfast. 30 tablet 0   . FLUoxetine (PROZAC) 40 MG capsule Take 40 mg by mouth daily.   02/10/2020 at Unknown time  . furosemide (LASIX) 20 MG tablet Take 20 mg by mouth daily.   02/10/2020 at Unknown time  . ibandronate (BONIVA) 150 MG tablet Take 150 mg by mouth every 30 (thirty) days.   3   . levothyroxine (SYNTHROID, LEVOTHROID) 75 MCG tablet Take 75 mcg by mouth daily before breakfast.    02/10/2020 at Unknown time  . magnesium oxide (MAG-OX) 400 MG tablet Take 1 tablet by mouth daily.   02/10/2020 at Unknown time  . meclizine (ANTIVERT) 25 MG tablet Take 25 mg by mouth 3 (three) times daily as needed (vertigo).    Unknown at PRN  . Multiple Vitamin (MULTIVITAMIN WITH MINERALS) TABS tablet Take 1 tablet by mouth daily.     . ondansetron (ZOFRAN) 4 MG tablet Take 1 tablet (4 mg total) by mouth daily as needed. 14 tablet 0 Unknown at PRN  . pantoprazole (PROTONIX) 40 MG tablet Take 1 tablet (40 mg total) by mouth daily. 90 tablet 1 02/10/2020 at Unknown time  . potassium chloride SA (KLOR-CON) 20 MEQ tablet Take 1 tablet (20 mEq total) by mouth daily. 30 tablet 0 02/10/2020 at Unknown time  . predniSONE (DELTASONE) 10 MG tablet Take 10 mg by mouth daily with breakfast.   02/10/2020 at 0800  . saccharomyces boulardii (FLORASTOR) 250 MG capsule Take 250 mg by mouth 2 (two) times daily.     . tadalafil, PAH, (ADCIRCA) 20 MG tablet Take 40 mg by mouth at bedtime.    02/10/2020 at 2000  . vitamin C (ASCORBIC ACID) 500 MG tablet  Take 500 mg by mouth daily.     . zaleplon (SONATA) 5 MG capsule Take 5 mg by mouth at bedtime as needed for sleep.    Unknown at PRN   Social History   Socioeconomic History  . Marital status: Divorced    Spouse name: Not on file  . Number of children: Not on file  . Years of education: Not on file  . Highest education level: Not on file  Occupational History  . Not on file  Tobacco Use  . Smoking status: Never Smoker  . Smokeless tobacco: Never Used  . Tobacco comment: no passive smokers in home  Substance and Sexual Activity  . Alcohol use: No    Alcohol/week: 0.0 standard drinks  . Drug  use: No  . Sexual activity: Not Currently  Other Topics Concern  . Not on file  Social History Narrative  . Not on file   Social Determinants of Health   Financial Resource Strain:   . Difficulty of Paying Living Expenses:   Food Insecurity:   . Worried About Charity fundraiser in the Last Year:   . Arboriculturist in the Last Year:   Transportation Needs:   . Film/video editor (Medical):   Marland Kitchen Lack of Transportation (Non-Medical):   Physical Activity:   . Days of Exercise per Week:   . Minutes of Exercise per Session:   Stress:   . Feeling of Stress :   Social Connections:   . Frequency of Communication with Friends and Family:   . Frequency of Social Gatherings with Friends and Family:   . Attends Religious Services:   . Active Member of Clubs or Organizations:   . Attends Archivist Meetings:   Marland Kitchen Marital Status:   Intimate Partner Violence:   . Fear of Current or Ex-Partner:   . Emotionally Abused:   Marland Kitchen Physically Abused:   . Sexually Abused:     Family History  Problem Relation Age of Onset  . Breast cancer Mother 84  . Diabetes Mellitus II Mother   . Hypothyroidism Mother   . Atrial fibrillation Mother   . Heart attack Father   . Diabetes Maternal Grandmother   . Heart attack Maternal Grandfather       Review of systems complete and found to be  negative unless listed above      PHYSICAL EXAM  General: Well developed, well nourished, in no acute distress HEENT:  Normocephalic and atramatic Neck:  No JVD.  Lungs: Clear bilaterally to auscultation and percussion. Heart: HRRR . Normal S1 and S2 without gallops or murmurs.  Abdomen: Bowel sounds are positive, abdomen soft and non-tender  Msk:  Back normal, normal gait. Normal strength and tone for age. Extremities: No clubbing, cyanosis or edema.   Neuro: Alert and oriented X 3. Psych:  Good affect, responds appropriately  Labs:   Lab Results  Component Value Date   WBC 9.7 02/11/2020   HGB 11.4 (L) 02/11/2020   HCT 37.3 02/11/2020   MCV 88.6 02/11/2020   PLT 195 02/11/2020    Recent Labs  Lab 02/11/20 1319  NA 136  K 3.5  CL 105  CO2 23  BUN 17  CREATININE 1.14*  CALCIUM 8.0*  PROT 5.4*  BILITOT 1.1  ALKPHOS 110  ALT 59*  AST 74*  GLUCOSE 199*   Lab Results  Component Value Date   CKTOTAL 137 11/25/2015   TROPONINI <0.03 02/21/2017    Lab Results  Component Value Date   CHOL 156 02/11/2020   Lab Results  Component Value Date   HDL 31 (L) 02/11/2020   Lab Results  Component Value Date   LDLCALC 100 (H) 02/11/2020   Lab Results  Component Value Date   TRIG 127 02/11/2020   Lab Results  Component Value Date   CHOLHDL 5.0 02/11/2020   No results found for: LDLDIRECT    Radiology: DG Chest Portable 1 View  Result Date: 02/11/2020 CLINICAL DATA:  Weakness EXAM: PORTABLE CHEST 1 VIEW COMPARISON:  01/06/2020 FINDINGS: Stable cardiomegaly. Atherosclerotic calcification of the aortic knob. Chronic fibrotic changes most pronounced within the perihilar and bibasilar regions. No definite new superimposed airspace opacity. No large pleural fluid collection. No pneumothorax. IMPRESSION: Chronic  fibrotic lung changes without definite new superimposed airspace opacity. Electronically Signed   By: Davina Poke D.O.   On: 02/11/2020 14:37    EKG:  Atrial flutter rapid ventricular response to 150  ASSESSMENT AND PLAN:  AFLUTTER RVR Chest Pain Pul HTN GERD Pul Fibrosis Palpitations Tachycardia Dyspnea  . Plan Recommend adding Lopressor for rate control discontinue diltiazem 2 continue Eliquis for anticoagulation 3 if rate control will consider cardioversion tomorrow 4 continue inhalers as necessary for pulmonary hypertension pulmonary fibrosis 5 chest pain probably noncardiac nonischemic recommend conservative management 6 recommend reflux medication like omeprazole as necessary 7 dyspnea unclear etiology possibly related to COPD or asthma recommend inhalers 8 continue Multaq as antiarrhythmic 9 agree with admission to telemetry follow-up EKGs and troponins 10 have the patient continue diabetes management control with diet 11 we will continue consider cardioversion in the morning if heart rate still elevated  Signed: Yolonda Kida MD, PHD, [facility] 02/11/2020, 10:42 PM

## 2020-02-11 NOTE — ED Notes (Signed)
Report received from [place], [place] for continuation of care.  Pt resting in POC, no acute s/s of distress noted.  Airway patent, rr even/unlabored, clear breath sounds bilaterallly, abd soft/flat/nontender. Skin warm/pink/dry. Heart rate noted to be afib rvr in the 150s currently on cardizem gtt at 41m/hr, titrated .  No other complaints noted at this time.  Will continue to monitor/reassesss.

## 2020-02-11 NOTE — ED Notes (Signed)
Dr. Manuella Ghazi and cardiologist at bedside for assessment and updated plan of care.  Per orders, dc cardizem gtt and give metoprolol 2.55m IVP q566m for goal HR of 65-105 so long as systolic bp remains above 100.  PT verbalized understanding.  Will medicate as ordered.

## 2020-02-11 NOTE — ED Notes (Signed)
Pt remains in Afib RVR despite being maxed out on cardizem at 36m/hr.  No acute changes in pt condition, GCS 15, rr even/unlabored, denies chest pain.

## 2020-02-11 NOTE — ED Provider Notes (Signed)
[facility] Emergency Department Provider Note  ____________________________________________  Time seen: Approximately 3:30 PM  I have reviewed the triage vital signs and the nursing notes.   HISTORY  Chief Complaint Chest Pain    HPI Julie Mcmillan is a 79 y.o. female with a history of atrial fibrillation, GERD, hypertension, pulmonary hypertension, pulmonary fibrosis who comes the ED complaining of palpitations and chest discomfort that started this morning, constant, no aggravating or alleviating factors.  She has been having intermittent symptoms with fatigue over the past week despite taking all of her usual medications.  EMS reports she initially was in atrial fibrillation with a heart rate of about 140.  They gave 5 mg of IV metoprolol which has caused her to convert to a sinus rhythm on arrival to the ED.  Patient reports she has not had any of her medications today including diltiazem XR, Eliquis, dronedarone.       Past Medical History:  Diagnosis Date  . A-fib ([place])   . Cancer ([place])    skin  . Chronic diarrhea   . Diverticulitis   . GERD (gastroesophageal reflux disease)    usually takes protonix  . Hip fracture ([place]) 11/2015   hairline fracture on right.  no surgery, just physical therapy  . Hypertension   . IBS (irritable bowel syndrome)   . IBS (irritable bowel syndrome)   . Interstitial lung disease ([place])    does not remember when  . Pulmonary fibrosis ([place])   . Pulmonary hypertension ([place])   . Seasonal affective disorder ([place])   . Shortness of breath dyspnea    with exertion  . Type 2 diabetes mellitus ([place]) 11/04/2014   pt states this has resolved with diet/exercise 11/07/18     Patient Active Problem List   Diagnosis Date Noted  . Atrial fibrillation with RVR ([place]) 02/11/2020  . Gastroenteritis 12/30/2019  . Closed left hip fracture ([place]) 11/07/2018  . Enterovesical fistula   . Acute diverticulitis 08/12/2017  . Colonic  diverticular abscess   . Dyspnea 10/27/2016  . Interstitial lung disease ([place]) 08/31/2016  . Depression, major, single episode, complete remission ([place]) 02/23/2016  . DOE (dyspnea on exertion) 01/26/2016  . Acute respiratory failure with hypoxia ([place]) 11/29/2015  . Fracture of greater trochanter of right femur ([place]) 11/29/2015  . Fall 11/26/2015  . OP (osteoporosis) 07/18/2015  . 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 Adult hypothyroidism 07/18/2015  . Fatigue 07/18/2015  . Anxiety 07/18/2015  . Diverticulitis large intestine 07/04/2015  . Atrial fibrillation with rapid ventricular response ([place]) 03/25/2015  . PAF (paroxysmal atrial fibrillation) ([place]) 03/25/2015  . Acquired hypothyroidism 01/11/2015  . Anemia, iron deficiency 01/10/2015  . Type 2 diabetes mellitus ([place]) 11/04/2014  . 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 Hypertension associated with diabetes ([place]) 11/04/2014  . Temporary cerebral vascular dysfunction 04/08/2014  . Dry mouth 01/05/2013  . Cannot sleep 01/05/2013  . Obstructive apnea 09/02/2012  . Bursitis, ischial 05/19/2012  . GERD (gastroesophageal reflux disease) 02/01/2012  . DD (diverticular disease) 08/14/2011     Past Surgical History:  Procedure Laterality Date  . ABDOMINAL HYSTERECTOMY     partial  . APPENDECTOMY    . BREAST BIOPSY Left    core bx- neg  . CATARACT EXTRACTION W/PHACO Right 03/16/2015   Procedure: CATARACT EXTRACTION PHACO AND INTRAOCULAR LENS PLACEMENT (IOC);  Surgeon: Leandrew Koyanagi, MD;  Location: [place];  Service: Ophthalmology;  Laterality: Right;  .  CATARACT EXTRACTION W/PHACO Left 06/13/2016   Procedure: CATARACT EXTRACTION PHACO AND INTRAOCULAR LENS PLACEMENT (IOC);  Surgeon: Leandrew Koyanagi, MD;  Location: [place];  Service: Ophthalmology;  Laterality: Left;  pre-diabetic - diet controlled  . COLONOSCOPY    . COLONOSCOPY WITH PROPOFOL N/A  05/25/2016   Procedure: COLONOSCOPY WITH PROPOFOL;  Surgeon: Manya Silvas, MD;  Location: [facility] ENDOSCOPY;  Service: Endoscopy;  Laterality: N/A;  . ESOPHAGOGASTRODUODENOSCOPY (EGD) WITH PROPOFOL N/A 05/25/2016   Procedure: ESOPHAGOGASTRODUODENOSCOPY (EGD) WITH PROPOFOL;  Surgeon: Manya Silvas, MD;  Location[facility]COPY;  Service: Endoscopy;  Laterality: N/A;  . NISSEN FUNDOPLICATION  4503   came apart in 2016 and ibs and reflux returned  . OPEN REDUCTION INTERNAL FIXATION (ORIF) DISTAL RADIAL FRACTURE Right 01/13/2016   Procedure: OPEN REDUCTION INTERNAL FIXATION (ORIF) DISTAL RADIAL FRACTURE;  Surgeon: Hessie Knows, MD;  Location: [facility] ORS;  Service: Orthopedics;  Laterality: Right;  . TONSILLECTOMY     and adenoidectomy     Prior to Admission medications   Medication Sig Start Date End Date Taking? Authorizing Provider  acetaminophen (TYLENOL) 500 MG tablet Take 1,000 mg by mouth at bedtime.   Yes [provider]  cephALEXin (KEFLEX) 250 MG capsule Take 250 mg by mouth daily. 01/20/20  Yes [provider]  colestipol (COLESTID) 1 g tablet Take 2 g by mouth 2 (two) times daily. 07/09/19  Yes [provider]  methylPREDNISolone (MEDROL) 8 MG tablet Take 8 mg by mouth daily.    Yes [provider]  predniSONE (DELTASONE) 10 MG tablet Take 10 mg by mouth daily with breakfast.   Yes [provider]  azaTHIOprine (IMURAN) 50 MG tablet Take 50 mg by mouth daily. 12/11/19   [provider]  busPIRone (BUSPAR) 7.5 MG tablet Take 7.5 mg by mouth 2 (two) times daily. 10/22/18   [provider]  Calcium Carbonate-Vitamin D (CALCIUM 600+D) 600-200 MG-UNIT TABS Take 1 tablet by mouth daily.    [provider]  cholestyramine (QUESTRAN) 4 g packet Take 4 g by mouth daily before breakfast.  10/05/18   [provider]  diltiazem (CARDIZEM CD) 240 MG 24 hr capsule Take 1 capsule (240 mg total) by mouth daily. 01/11/20 02/10/20   Wyvonnia Dusky, MD  diltiazem (CARDIZEM) 30 MG tablet Take 30 mg by mouth as needed. 01/20/20   [provider]  diltiazem (TIAZAC) 180 MG 24 hr capsule Take 180 mg by mouth daily. 01/20/20   [provider]  dronedarone (MULTAQ) 400 MG tablet Take 1 tablet (400 mg total) by mouth 2 (two) times daily with a meal. 01/10/20 02/09/20  Wyvonnia Dusky, MD  ELIQUIS 5 MG TABS tablet Take 5 mg by mouth 2 (two) times daily. 10/14/19   [provider]  ferrous sulfate 325 (65 FE) MG tablet Take 1 tablet (325 mg total) by mouth daily with breakfast. 01/11/20 02/10/20  Wyvonnia Dusky, MD  FLUoxetine (PROZAC) 40 MG capsule Take 40 mg by mouth daily. 10/22/18   [provider]  furosemide (LASIX) 20 MG tablet Take 20 mg by mouth daily.    [provider]  ibandronate (BONIVA) 150 MG tablet Take 150 mg by mouth every 30 (thirty) days.  05/21/18   [provider]  levothyroxine (SYNTHROID, LEVOTHROID) 75 MCG tablet Take 75 mcg by mouth daily before breakfast.     [provider]  magnesium oxide (MAG-OX) 400 MG tablet Take 1 tablet by mouth daily. 01/29/20  [provider]  meclizine (ANTIVERT) 25 MG tablet Take 25 mg by mouth 3 (three) times daily as needed (vertigo).  03/27/18   [provider]  metoprolol tartrate (LOPRESSOR) 25 MG tablet Take 1 tablet (25 mg total) by mouth 2 (two) times daily. 01/10/20 02/09/20  Wyvonnia Dusky, MD  Multiple Vitamin (MULTIVITAMIN WITH MINERALS) TABS tablet Take 1 tablet by mouth daily.    [provider]  ondansetron (ZOFRAN) 4 MG tablet Take 1 tablet (4 mg total) by mouth daily as needed. 12/29/19 12/28/20  Merlyn Lot, MD  pantoprazole (PROTONIX) 40 MG tablet Take 1 tablet (40 mg total) by mouth daily. 03/29/15   Glendon Axe, MD  potassium chloride SA (KLOR-CON) 20 MEQ tablet Take 1 tablet (20 mEq total) by mouth daily. 01/10/20 02/09/20  Wyvonnia Dusky, MD   sulfamethoxazole-trimethoprim (BACTRIM) 400-80 MG tablet Take 1 tablet by mouth daily. 12/03/19   [provider]  tadalafil, PAH, (ADCIRCA) 20 MG tablet Take 40 mg by mouth at bedtime.  03/25/17   [provider]  vitamin C (ASCORBIC ACID) 500 MG tablet Take 500 mg by mouth daily.    [provider]  zaleplon (SONATA) 5 MG capsule Take 5 mg by mouth at bedtime as needed for sleep.  10/22/18   [provider]     Allergies Amlodipine, Levofloxacin, Nexium [esomeprazole magnesium], and Aspartame   Family History  Problem Relation Age of Onset  . Breast cancer Mother 79  . Diabetes Mellitus II Mother   . Hypothyroidism Mother   . Atrial fibrillation Mother   . Heart attack Father   . Diabetes Maternal Grandmother   . Heart attack Maternal Grandfather     Social History Social History   Tobacco Use  . Smoking status: Never Smoker  . Smokeless tobacco: Never Used  . Tobacco comment: no passive smokers in home  Substance Use Topics  . Alcohol use: No    Alcohol/week: 0.0 standard drinks  . Drug use: No    Review of Systems  Constitutional:   No fever or chills.  ENT:   No sore throat. No rhinorrhea. Cardiovascular: Positive chest pain as above without syncope. Respiratory:   No dyspnea or cough. Gastrointestinal:   Negative for abdominal pain, vomiting and diarrhea.  Musculoskeletal:   Negative for focal pain or swelling All other systems reviewed and are negative except as documented above in ROS and HPI.  ____________________________________________   PHYSICAL EXAM:  VITAL SIGNS: ED Triage Vitals  Enc Vitals Group     BP 02/11/20 1318 (!) 115/56     Pulse Rate 02/11/20 1318 96     Resp 02/11/20 1322 18     Temp 02/11/20 1323 98 F (36.7 C)     Temp Source 02/11/20 1323 Oral     SpO2 02/11/20 1322 100 %     Weight 02/11/20 1318 145 lb 4.5 oz (65.9 kg)     Height 02/11/20 1318 _0  (1.626 m)     Head Circumference --       Peak Flow --      Pain Score 02/11/20 1317 0     Pain Loc --      Pain Edu? --      Excl. in [place]? --     Vital signs reviewed, nursing assessments reviewed.   Constitutional:   Alert and oriented. Non-toxic appearance. Eyes:   Conjunctivae are normal. EOMI. PERRL. ENT      Head:   Normocephalic  and atraumatic.      Nose:   Wearing a mask.      Mouth/Throat:   Wearing a mask.      Neck:   No meningismus. Full ROM. Hematological/Lymphatic/Immunilogical:   No cervical lymphadenopathy. Cardiovascular:  RRR. Symmetric bilateral radial and DP pulses.  No murmurs. Cap refill less than 2 seconds. Respiratory:   Normal respiratory effort without tachypnea/retractions. Breath sounds are clear and equal bilaterally. No wheezes/rales/rhonchi. Gastrointestinal:   Soft and nontender. Non distended. There is no CVA tenderness.  No rebound, rigidity, or guarding. Musculoskeletal:   Normal range of motion in all extremities. No joint effusions.  No lower extremity tenderness.  No edema. Neurologic:   Normal speech and language.  Motor grossly intact. No acute focal neurologic deficits are appreciated.  Skin:    Skin is warm, dry and intact. No rash noted.  No petechiae, purpura, or bullae.  ____________________________________________    LABS (pertinent positives/negatives) (all labs ordered are listed, but only abnormal results are displayed) Labs Reviewed  COMPREHENSIVE METABOLIC PANEL - Abnormal; Notable for the following components:      Result Value   Glucose, Bld 199 (*)    Creatinine, Ser 1.14 (*)    Calcium 8.0 (*)    Total Protein 5.4 (*)    Albumin 3.1 (*)    AST 74 (*)    ALT 59 (*)    GFR calc non Af Amer 45 (*)    GFR calc Af Amer 53 (*)    All other components within normal limits  CBC WITH DIFFERENTIAL/PLATELET - Abnormal; Notable for the following components:   Hemoglobin 11.4 (*)    RDW 21.9 (*)    Lymphs Abs 0.6 (*)    Monocytes Absolute 1.2 (*)    Abs Immature  Granulocytes 0.11 (*)    All other components within normal limits  SARS CORONAVIRUS 2 BY RT PCR (HOSPITAL ORDER, [place] [facility])  LIPASE, BLOOD  MAGNESIUM  TSH  URINALYSIS, COMPLETE (UACMP) WITH MICROSCOPIC  MAGNESIUM  HEMOGLOBIN A1C  LIPID PANEL  TROPONIN I (HIGH SENSITIVITY)  TROPONIN I (HIGH SENSITIVITY)   ____________________________________________   EKG  Interpreted by me Normal sinus rhythm rate of 94, normal axis and intervals.  Normal QRS ST segments and T waves.  Repeat EKG 1329 shows A. fib with RVR, rate of 149.  Repeat EKG 1336 after IV diltiazem shows sinus rhythm rate of 74, no acute ischemic changes  Repeat EKG at 1411 shows atrial flutter, rate of 151, no acute ischemic changes.  ____________________________________________    [id number]  DG Chest Portable 1 View  Result Date: 02/11/2020 CLINICAL DATA:  Weakness EXAM: PORTABLE CHEST 1 VIEW COMPARISON:  01/06/2020 FINDINGS: Stable cardiomegaly. Atherosclerotic calcification of the aortic knob. Chronic fibrotic changes most pronounced within the perihilar and bibasilar regions. No definite new superimposed airspace opacity. No large pleural fluid collection. No pneumothorax. IMPRESSION: Chronic fibrotic lung changes without definite new superimposed airspace opacity. Electronically Signed   By: Davina Poke D.O.   On: 02/11/2020 14:37    ____________________________________________   PROCEDURES .Critical Care Performed by: Carrie Mew, MD Authorized by: Carrie Mew, MD   Critical care provider statement:    Critical care time (minutes):  35   Critical care time was exclusive of:  Separately billable procedures and treating other patients   Critical care was necessary to treat or prevent imminent or life-threatening deterioration of the following conditions:  Cardiac failure and circulatory failure  Critical care was time spent personally by me on the following  activities:  Development of treatment plan with patient or surrogate, discussions with consultants, evaluation of patient's response to treatment, examination of patient, obtaining history from patient or surrogate, ordering and performing treatments and interventions, ordering and review of laboratory studies, ordering and review of radiographic studies, pulse oximetry, re-evaluation of patient's condition and review of old charts    ____________________________________________  DIFFERENTIAL DIAGNOSIS   Electrolyte abnormality, hypothyroidism, A. fib with RVR, non-STEMI  CLINICAL IMPRESSION / ASSESSMENT AND PLAN / ED COURSE  Medications ordered in the ED: Medications  diltiazem (CARDIZEM CD) 24 hr capsule 240 mg (240 mg Oral Given 02/11/20 1418)  dronedarone (MULTAQ) tablet 400 mg (has no administration in time range)  apixaban (ELIQUIS) tablet 5 mg (5 mg Oral Given 02/11/20 1335)  diltiazem (CARDIZEM) 125 mg in dextrose 5% 125 mL (1 mg/mL) infusion (10 mg/hr Intravenous Rate/Dose Change 02/11/20 1451)  calcium-vitamin D (OSCAL WITH D) 500-200 MG-UNIT per tablet 1 tablet (has no administration in time range)  levothyroxine (SYNTHROID) tablet 75 mcg (has no administration in time range)  tadalafil (PAH) (ADCIRCA) tablet 40 mg (has no administration in time range)  busPIRone (BUSPAR) tablet 7.5 mg (has no administration in time range)  meclizine (ANTIVERT) tablet 25 mg (has no administration in time range)  zolpidem (AMBIEN) tablet 5 mg (has no administration in time range)  cholestyramine (QUESTRAN) packet 4 g (has no administration in time range)  FLUoxetine (PROZAC) capsule 40 mg (has no administration in time range)  furosemide (LASIX) tablet 20 mg (has no administration in time range)  multivitamin with minerals tablet 1 tablet (has no administration in time range)  acetaminophen (TYLENOL) tablet 1,000 mg (has no administration in time range)  azaTHIOprine (IMURAN) tablet 50 mg (has no  administration in time range)  colestipol (COLESTID) tablet 2 g (has no administration in time range)  potassium chloride SA (KLOR-CON) CR tablet 20 mEq (has no administration in time range)  ferrous sulfate tablet 325 mg (has no administration in time range)  magnesium oxide (MAG-OX) tablet 400 mg (has no administration in time range)  acetaminophen (TYLENOL) tablet 650 mg (has no administration in time range)  ondansetron (ZOFRAN) injection 4 mg (has no administration in time range)  ALPRAZolam (XANAX) tablet 0.25 mg (has no administration in time range)  sodium chloride 0.9 % bolus 500 mL (0 mLs Intravenous Stopped 02/11/20 1417)  diltiazem (CARDIZEM) injection 15 mg (15 mg Intravenous Given 02/11/20 1333)    Pertinent labs & imaging results that were available during my care of the patient were reviewed by me and considered in my medical decision making (see chart for details).  TOMECA HELM was evaluated in Emergency Department on 02/11/2020 for the symptoms described in the history of present illness. She was evaluated in the context of the global COVID-19 pandemic, which necessitated consideration that the patient might be at risk for infection with the SARS-CoV-2 virus that causes COVID-19. Institutional protocols and algorithms that pertain to the evaluation of patients at risk for COVID-19 are in a state of rapid change based on information released by regulatory bodies including the CDC and federal and state organizations. These policies and algorithms were followed during the patient's care in the ED.     Clinical Course as of Feb 10 1533  Thu Feb 11, 2020  1330 Pt arrived in [place], converted by metoprolol 65m IV given by EMS. Subsequently back in afib/rvr with HR 150, probably  due to IV metroprolol wearing off. I ordered her home dilt XR, dronedarone, eliquis. Will give IVP dilt 52m for now.   [PS]  1410 Labs are all unremarkable and reassuring.  Remains in sinus rhythm after  diltiazem injection.  Will observe in the ED to ensure control of her rhythm.   [PS]    Clinical Course User Index [PS] SCarrie Mew MD    ----------------------------------------- 3:33 PM on 02/11/2020 -----------------------------------------  After diltiazem injection, she had recurrence of A. fib with RVR/a flutter after about 30 minutes.  Started on diltiazem continuous infusion to stabilize.  Plan to hospitalize.  [facility]  Recently [facility] had informed that they were at capacity and unable to accept new patients.  Will check again to see if they have bed availability for transfer.  Otherwise patient is capable of being cared for at this facility.   ____________________________________________   FINAL CLINICAL IMPRESSION(S) / ED DIAGNOSES    Final diagnoses:  Atrial fibrillation with rapid ventricular response ([facility]     ED Discharge Orders    None      Portions of this note were generated with dragon dictation software. Dictation errors may occur despite best attempts at proofreading.   SCarrie Mew MD 02/11/20 1534

## 2020-02-11 NOTE — Progress Notes (Signed)
Patient admitted into room. Placed on bedside monitor and verified with central tele.  2.5 mg  IV metoprolol given x2, HR remains unchanged, is currently 144. Cardio MD paged, awaiting response.

## 2020-02-11 NOTE — H&P (Signed)
[facility] [place] NAME: Kallyn Demarcus    MR#:  [id number]  DATE OF BIRTH:  09-08-1941  DATE OF ADMISSION:  02/11/2020  PRIMARY CARE PHYSICIAN: Kirk Ruths, MD   REQUESTING/REFERRING PHYSICIAN: Carrie Mew, MD  CHIEF COMPLAINT:   Chief Complaint  Patient presents with  . Chest Pain    HISTORY OF PRESENT ILLNESS:  Tevis Conger  is a 79 y.o. female with a known history of paroxysmal atrial fibrillation, GERD, hypertension, pulmonary hypertension, pulmonary fibrosis is being admitted for rapid A. fib with palpitations and chest discomfort that started this morning, constant, no aggravating or alleviating factors.  She has been having intermittent symptoms with fatigue over the past week despite taking all of her usual medications.  EMS reported she initially was in atrial fibrillation with a heart rate of about 140.  They gave 5 mg of IV metoprolol which has caused her to convert to a sinus rhythm on arrival to the ED.  Patient reports she has not had any of her medications today including diltiazem XR, Eliquis, dronedarone.  Her cardiologist is at [facility] Dr. Norm Salt.  While in the ED she was given diltiazem injection, she had recurrence of A. fib with RVR/a flutter after about 30 minutes.  Started on diltiazem continuous infusion but her heart rate continues to be in 150s. PAST MEDICAL HISTORY:   Past Medical History:  Diagnosis Date  . A-fib ([place])   . Cancer ([place])    skin  . Chronic diarrhea   . Diverticulitis   . GERD (gastroesophageal reflux disease)    usually takes protonix  . Hip fracture ([place]) 11/2015   hairline fracture on right.  no surgery, just physical therapy  . Hypertension   . IBS (irritable bowel syndrome)   . IBS (irritable bowel syndrome)   . Interstitial lung disease ([place])    does not remember when  . Pulmonary fibrosis ([place])   . Pulmonary hypertension ([place])   . Seasonal affective disorder ([place])   .  Shortness of breath dyspnea    with exertion  . Type 2 diabetes mellitus ([place]) 11/04/2014   pt states this has resolved with diet/exercise 11/07/18   PAST SURGICAL HISTORY:   Past Surgical History:  Procedure Laterality Date  . ABDOMINAL HYSTERECTOMY     partial  . APPENDECTOMY    . BREAST BIOPSY Left    core bx- neg  . CATARACT EXTRACTION W/PHACO Right 03/16/2015   Procedure: CATARACT EXTRACTION PHACO AND INTRAOCULAR LENS PLACEMENT (IOC);  Surgeon: Leandrew Koyanagi, MD;  Location: [facility];  Service: Ophthalmology;  Laterality: Right;  . CATARACT EXTRACTION W/PHACO Left 06/13/2016   Procedure: CATARACT EXTRACTION PHACO AND INTRAOCULAR LENS PLACEMENT (IOC);  Surgeon: Leandrew Koyanagi, MD;  Location: [facility];  Service: Ophthalmology;  Laterality: Left;  pre-diabetic - diet controlled  . COLONOSCOPY    . COLONOSCOPY WITH PROPOFOL N/A 05/25/2016   Procedure: COLONOSCOPY WITH PROPOFOL;  Surgeon: Manya Silvas, MD;  Location: [facility] ENDOSCOPY;  Service: Endoscopy;  Laterality: N/A;  . ESOPHAGOGASTRODUODENOSCOPY (EGD) WITH PROPOFOL N/A 05/25/2016   Procedure: ESOPHAGOGASTRODUODENOSCOPY (EGD) WITH PROPOFOL;  Surgeon: Manya Silvas, MD;  Location: [facility] ENDOSCOPY;  Service: Endoscopy;  Laterality: N/A;  . NISSEN FUNDOPLICATION  6812   came apart in 2016 and ibs and reflux returned  . OPEN REDUCTION INTERNAL FIXATION (ORIF) DISTAL RADIAL FRACTURE Right 01/13/2016   Procedure: OPEN REDUCTION INTERNAL FIXATION (ORIF) DISTAL RADIAL FRACTURE;  Surgeon: Hessie Knows, MD;  Location: [facility] ORS;  Service: Orthopedics;  Laterality: Right;  . TONSILLECTOMY     and adenoidectomy   SOCIAL HISTORY:   Social History   Tobacco Use  . Smoking status: Never Smoker  . Smokeless tobacco: Never Used  . Tobacco comment: no passive smokers in home  Substance Use Topics  . Alcohol use: No    Alcohol/week: 0.0 standard drinks   FAMILY HISTORY:   Family History  Problem Relation Age  of Onset  . Breast cancer Mother 69  . Diabetes Mellitus II Mother   . Hypothyroidism Mother   . Atrial fibrillation Mother   . Heart attack Father   . Diabetes Maternal Grandmother   . Heart attack Maternal Grandfather    DRUG ALLERGIES:   Allergies  Allergen Reactions  . Amlodipine Swelling  . Levofloxacin Other (See Comments)    Other reaction(s): Other (See Comments) No flouroquinolones while patient on sotalol  Other reaction(s): Other (See Comments) No flouroquinolones while patient on sotalol.  Other reaction(s): Other (See Comments) No flouroquinolones while patient on sotalol   . Nexium [Esomeprazole Magnesium] Diarrhea  . Aspartame Other (See Comments)    Patient reports "self diagnosed intolerance" to artificial sweeteners.   REVIEW OF SYSTEMS:  Review of Systems  Constitutional: Positive for malaise/fatigue. Negative for diaphoresis, fever and weight loss.  HENT: Negative for ear discharge, ear pain, hearing loss, nosebleeds, sore throat and tinnitus.   Eyes: Negative for blurred vision and pain.  Respiratory: Negative for cough, hemoptysis, shortness of breath and wheezing.   Cardiovascular: Positive for palpitations. Negative for chest pain, orthopnea and leg swelling.  Gastrointestinal: Negative for abdominal pain, blood in stool, constipation, diarrhea, heartburn, nausea and vomiting.  Genitourinary: Negative for dysuria, frequency and urgency.  Musculoskeletal: Negative for back pain and myalgias.  Skin: Negative for itching and rash.  Neurological: Negative for dizziness, tingling, tremors, focal weakness, seizures, weakness and headaches.  Psychiatric/Behavioral: Negative for depression. The patient is not nervous/anxious.    MEDICATIONS AT HOME:   Prior to Admission medications   Medication Sig Start Date End Date Taking? Authorizing Provider  acetaminophen (TYLENOL) 500 MG tablet Take 1,000 mg by mouth at bedtime.   Yes [provider]    busPIRone (BUSPAR) 7.5 MG tablet Take 7.5 mg by mouth 2 (two) times daily. 10/22/18  Yes [provider]  Calcium Carbonate-Vitamin D (CALCIUM 600+D) 600-200 MG-UNIT TABS Take 1 tablet by mouth daily.   Yes [provider]  cephALEXin (KEFLEX) 250 MG capsule Take 250 mg by mouth daily. 01/20/20  Yes [provider]  colestipol (COLESTID) 1 g tablet Take 2 g by mouth 2 (two) times daily before a meal.    Yes [provider]  diltiazem (CARDIZEM) 30 MG tablet Take 30 mg by mouth daily as needed (prolonged palpatations).  01/20/20  Yes [provider]  diltiazem (TIAZAC) 180 MG 24 hr capsule Take 180 mg by mouth daily. 01/20/20  Yes [provider]  dronedarone (MULTAQ) 400 MG tablet Take 1 tablet (400 mg total) by mouth 2 (two) times daily with a meal. 01/10/20 02/11/20 Yes Wyvonnia Dusky, MD  ELIQUIS 5 MG TABS tablet Take 5 mg by mouth 2 (two) times daily. 10/14/19  Yes [provider]  ferrous sulfate 325 (65 FE) MG tablet Take 1 tablet (325 mg total) by mouth daily with breakfast. 01/11/20 02/11/20 Yes Wyvonnia Dusky, MD  FLUoxetine (PROZAC) 40 MG capsule Take 40 mg by mouth daily. 10/22/18  Yes  [provider]  furosemide (LASIX) 20 MG tablet Take 20 mg by mouth daily.   Yes [provider]  ibandronate (BONIVA) 150 MG tablet Take 150 mg by mouth every 30 (thirty) days.  05/21/18  Yes [provider]  levothyroxine (SYNTHROID, LEVOTHROID) 75 MCG tablet Take 75 mcg by mouth daily before breakfast.    Yes [provider]  magnesium oxide (MAG-OX) 400 MG tablet Take 1 tablet by mouth daily. 01/29/20  Yes [provider]  meclizine (ANTIVERT) 25 MG tablet Take 25 mg by mouth 3 (three) times daily as needed (vertigo).  03/27/18  Yes [provider]  Multiple Vitamin (MULTIVITAMIN WITH MINERALS) TABS tablet Take 1 tablet by mouth daily.   Yes [provider]  ondansetron (ZOFRAN) 4  MG tablet Take 1 tablet (4 mg total) by mouth daily as needed. 12/29/19 12/28/20 Yes Merlyn Lot, MD  pantoprazole (PROTONIX) 40 MG tablet Take 1 tablet (40 mg total) by mouth daily. 03/29/15  Yes Glendon Axe, MD  potassium chloride SA (KLOR-CON) 20 MEQ tablet Take 1 tablet (20 mEq total) by mouth daily. 01/10/20 02/11/20 Yes Wyvonnia Dusky, MD  predniSONE (DELTASONE) 10 MG tablet Take 10 mg by mouth daily with breakfast.   Yes [provider]  saccharomyces boulardii (FLORASTOR) 250 MG capsule Take 250 mg by mouth 2 (two) times daily.   Yes [provider]  tadalafil, PAH, (ADCIRCA) 20 MG tablet Take 40 mg by mouth at bedtime.  03/25/17  Yes [provider]  vitamin C (ASCORBIC ACID) 500 MG tablet Take 500 mg by mouth daily.   Yes [provider]  zaleplon (SONATA) 5 MG capsule Take 5 mg by mouth at bedtime as needed for sleep.  10/22/18  Yes [provider]    VITAL SIGNS:  Blood pressure 108/78, pulse (!) 154, temperature 98 F (36.7 C), temperature source Oral, resp. rate 18, height _0  (1.626 m), weight 65.9 kg, SpO2 97 %. PHYSICAL EXAMINATION:  Physical Exam HENT:     Head: Normocephalic and atraumatic.  Eyes:     Conjunctiva/sclera: Conjunctivae normal.     Pupils: Pupils are equal, round, and reactive to light.  Neck:     Thyroid: No thyromegaly.     Trachea: No tracheal deviation.  Cardiovascular:     Rate and Rhythm: Tachycardia present. Rhythm irregularly irregular.     Heart sounds: Normal heart sounds.  Pulmonary:     Effort: Pulmonary effort is normal. No respiratory distress.     Breath sounds: Normal breath sounds. No wheezing.  Chest:     Chest wall: No tenderness.  Abdominal:     General: Bowel sounds are normal. There is no distension.     Palpations: Abdomen is soft.     Tenderness: There is no abdominal tenderness.  Musculoskeletal:        General: Normal range of motion.     Cervical back: Normal range of  motion and neck supple.  Skin:    General: Skin is warm and dry.     Findings: No rash.  Neurological:     Mental Status: She is alert and oriented to person, place, and time.     Cranial Nerves: No cranial nerve deficit.    LABORATORY PANEL:   CBC Recent Labs  Lab 02/11/20 1319  WBC 9.7  HGB 11.4*  HCT 37.3  PLT 195   ------------------------------------------------------------------------------------------------------------------  Chemistries  Recent Labs  Lab 02/11/20 1319 02/11/20 1319 02/11/20 1630  NA 136  --   --   K 3.5  --   --   CL 105  --   --   CO2 23  --   --   GLUCOSE 199*  --   --   BUN 17  --   --   CREATININE 1.14*  --   --   CALCIUM 8.0*  --   --   MG 1.8   < > 1.9  AST 74*  --   --   ALT 59*  --   --   ALKPHOS 110  --   --   BILITOT 1.1  --   --    < > = values in this interval not displayed.   ------------------------------------------------------------------------------------------------------------------  Cardiac Enzymes No results for input(s): TROPONINI in the last 168 hours. ------------------------------------------------------------------------------------------------------------------  RADIOLOGY:  DG Chest Portable 1 View  Result Date: 02/11/2020 CLINICAL DATA:  Weakness EXAM: PORTABLE CHEST 1 VIEW COMPARISON:  01/06/2020 FINDINGS: Stable cardiomegaly. Atherosclerotic calcification of the aortic knob. Chronic fibrotic changes most pronounced within the perihilar and bibasilar regions. No definite new superimposed airspace opacity. No large pleural fluid collection. No pneumothorax. IMPRESSION: Chronic fibrotic lung changes without definite new superimposed airspace opacity. Electronically Signed   By: Davina Poke D.O.   On: 02/11/2020 14:37   IMPRESSION AND PLAN:  79 year old female with a known history of paroxysmal atrial fibrillation, GERD, hypertension, pulmonary hypertension, pulmonary fibrosis is being admitted for rapid A.  Fib  Paroxysmal A. fib with recurrent palpitation -This is a recurrent issue for her -Cardiology/electrophysiology is Dr. Norm Salt at [facility], tadalafil -She did not seem to respond well to Cardizem drip, after discussion with cardiology Dr. Clayborn Bigness -we are switching her to IV Lopressor 2.5 mg every 5-minute to get her heart rate under 100 as long as her blood pressure can tolerate. stop Cardizem -She had a Holter monitor placed for last 8 days as per her EP @ [facility] -Continue Eliquis for anticoagulation as her CHA2D-VASc score is 5 -If Lopressor does not control her heart rate more able to convert her to normal sinus rhythm she may need cardioversion in the morning per Dr. Clayborn Bigness  Type 2 diabetes mellitus without complication Continue diabetic diet, check A1c  Essential hypertension Well-controlled for now  Major depression Continue BuSpar, Prozac    All the records are reviewed and case discussed with ED provider. Management plans discussed with the patient, nursing, cardiology Dr. Clayborn Bigness and they are in agreement.  CODE STATUS: Full code  TOTAL TIME TAKING CARE OF THIS PATIENT: 45 minutes.    Max Sane M.D on 02/11/2020 at 5:06 PM  [facility]   CC: Primary care physician; Kirk Ruths, MD   Note: This dictation was prepared with Dragon dictation along with smaller phrase technology. Any transcriptional errors that result from this process are unintentional.

## 2020-02-12 ENCOUNTER — Inpatient Hospital Stay: Payer: Medicare HMO

## 2020-02-12 ENCOUNTER — Observation Stay: Payer: Medicare HMO

## 2020-02-12 DIAGNOSIS — R109 Unspecified abdominal pain: Secondary | ICD-10-CM | POA: Diagnosis not present

## 2020-02-12 DIAGNOSIS — J984 Other disorders of lung: Secondary | ICD-10-CM | POA: Diagnosis present

## 2020-02-12 DIAGNOSIS — I4892 Unspecified atrial flutter: Secondary | ICD-10-CM | POA: Diagnosis present

## 2020-02-12 DIAGNOSIS — Z8249 Family history of ischemic heart disease and other diseases of the circulatory system: Secondary | ICD-10-CM | POA: Diagnosis not present

## 2020-02-12 DIAGNOSIS — I480 Paroxysmal atrial fibrillation: Secondary | ICD-10-CM | POA: Diagnosis present

## 2020-02-12 DIAGNOSIS — R042 Hemoptysis: Secondary | ICD-10-CM

## 2020-02-12 DIAGNOSIS — I484 Atypical atrial flutter: Secondary | ICD-10-CM | POA: Diagnosis not present

## 2020-02-12 DIAGNOSIS — J8410 Pulmonary fibrosis, unspecified: Secondary | ICD-10-CM | POA: Diagnosis present

## 2020-02-12 DIAGNOSIS — F410 Panic disorder [episodic paroxysmal anxiety] without agoraphobia: Secondary | ICD-10-CM | POA: Diagnosis present

## 2020-02-12 DIAGNOSIS — F329 Major depressive disorder, single episode, unspecified: Secondary | ICD-10-CM | POA: Diagnosis present

## 2020-02-12 DIAGNOSIS — K219 Gastro-esophageal reflux disease without esophagitis: Secondary | ICD-10-CM | POA: Diagnosis present

## 2020-02-12 DIAGNOSIS — I10 Essential (primary) hypertension: Secondary | ICD-10-CM | POA: Diagnosis present

## 2020-02-12 DIAGNOSIS — I4891 Unspecified atrial fibrillation: Secondary | ICD-10-CM | POA: Diagnosis present

## 2020-02-12 DIAGNOSIS — R040 Epistaxis: Secondary | ICD-10-CM | POA: Diagnosis not present

## 2020-02-12 DIAGNOSIS — Z833 Family history of diabetes mellitus: Secondary | ICD-10-CM | POA: Diagnosis not present

## 2020-02-12 DIAGNOSIS — Z90710 Acquired absence of both cervix and uterus: Secondary | ICD-10-CM | POA: Diagnosis not present

## 2020-02-12 DIAGNOSIS — I272 Pulmonary hypertension, unspecified: Secondary | ICD-10-CM | POA: Diagnosis present

## 2020-02-12 DIAGNOSIS — J9601 Acute respiratory failure with hypoxia: Secondary | ICD-10-CM

## 2020-02-12 DIAGNOSIS — K920 Hematemesis: Secondary | ICD-10-CM | POA: Diagnosis not present

## 2020-02-12 DIAGNOSIS — J9611 Chronic respiratory failure with hypoxia: Secondary | ICD-10-CM | POA: Diagnosis present

## 2020-02-12 DIAGNOSIS — Z9049 Acquired absence of other specified parts of digestive tract: Secondary | ICD-10-CM | POA: Diagnosis not present

## 2020-02-12 DIAGNOSIS — Z7952 Long term (current) use of systemic steroids: Secondary | ICD-10-CM | POA: Diagnosis not present

## 2020-02-12 DIAGNOSIS — Z79890 Hormone replacement therapy (postmenopausal): Secondary | ICD-10-CM | POA: Diagnosis not present

## 2020-02-12 DIAGNOSIS — Z79899 Other long term (current) drug therapy: Secondary | ICD-10-CM | POA: Diagnosis not present

## 2020-02-12 DIAGNOSIS — E119 Type 2 diabetes mellitus without complications: Secondary | ICD-10-CM | POA: Diagnosis present

## 2020-02-12 DIAGNOSIS — Z7901 Long term (current) use of anticoagulants: Secondary | ICD-10-CM | POA: Diagnosis not present

## 2020-02-12 DIAGNOSIS — Z20822 Contact with and (suspected) exposure to covid-19: Secondary | ICD-10-CM | POA: Diagnosis present

## 2020-02-12 LAB — BASIC METABOLIC PANEL
Anion gap: 11 (ref 5–15)
BUN: 20 mg/dL (ref 8–23)
CO2: 20 mmol/L — ABNORMAL LOW (ref 22–32)
Calcium: 8.7 mg/dL — ABNORMAL LOW (ref 8.9–10.3)
Chloride: 104 mmol/L (ref 98–111)
Creatinine, Ser: 1.1 mg/dL — ABNORMAL HIGH (ref 0.44–1.00)
GFR calc Af Amer: 55 mL/min — ABNORMAL LOW (ref >60)
GFR calc non Af Amer: 47 mL/min — ABNORMAL LOW (ref >60)
Glucose, Bld: 150 mg/dL — ABNORMAL HIGH (ref 70–99)
Potassium: 4.5 mmol/L (ref 3.5–5.1)
Sodium: 135 mmol/L (ref 135–145)

## 2020-02-12 LAB — MRSA PCR SCREENING: MRSA by PCR: NEGATIVE

## 2020-02-12 LAB — C DIFFICILE QUICK SCREEN W PCR REFLEX
C Diff antigen: NEGATIVE
C Diff interpretation: NOT DETECTED
C Diff toxin: NEGATIVE

## 2020-02-12 LAB — APTT: aPTT: 31 seconds (ref 24–36)

## 2020-02-12 LAB — CBC
HCT: 41.9 % (ref 36.0–46.0)
Hemoglobin: 13.1 g/dL (ref 12.0–15.0)
MCH: 27.5 pg (ref 26.0–34.0)
MCHC: 31.3 g/dL (ref 30.0–36.0)
MCV: 87.8 fL (ref 80.0–100.0)
Platelets: 222 10*3/uL (ref 150–400)
RBC: 4.77 MIL/uL (ref 3.87–5.11)
RDW: 21.3 % — ABNORMAL HIGH (ref 11.5–15.5)
WBC: 10.5 10*3/uL (ref 4.0–10.5)
nRBC: 0 % (ref 0.0–0.2)

## 2020-02-12 LAB — GLUCOSE, CAPILLARY
Glucose-Capillary: 140 mg/dL — ABNORMAL HIGH (ref 70–99)
Glucose-Capillary: 139 mg/dL — ABNORMAL HIGH (ref 70–99)
Glucose-Capillary: 100 mg/dL — ABNORMAL HIGH (ref 70–99)

## 2020-02-12 LAB — TYPE AND SCREEN
ABO/RH(D): O POS
Antibody Screen: NEGATIVE

## 2020-02-12 LAB — PROTIME-INR
INR: 1.4 — ABNORMAL HIGH (ref 0.8–1.2)
Prothrombin Time: 16.2 seconds — ABNORMAL HIGH (ref 11.4–15.2)

## 2020-02-12 MED ORDER — INSULIN ASPART 100 UNIT/ML ~~LOC~~ SOLN
0.0000 [IU] | Freq: Three times a day (TID) | SUBCUTANEOUS | Status: DC
  Administered 2020-02-12: 1 [IU] via SUBCUTANEOUS
  Filled 2020-02-12: qty 1

## 2020-02-12 MED ORDER — EMPTY CONTAINERS FLEXIBLE MISC
900.0000 mg | Freq: Once | Status: DC
  Filled 2020-02-12: qty 90

## 2020-02-12 MED ORDER — LOPERAMIDE HCL 2 MG PO CAPS
2.0000 mg | ORAL_CAPSULE | Freq: Four times a day (QID) | ORAL | Status: DC | PRN
  Administered 2020-02-12: 2 mg via ORAL
  Filled 2020-02-12: qty 1

## 2020-02-12 MED ORDER — PANTOPRAZOLE SODIUM 40 MG IV SOLR: 40.0000 mg | Freq: Two times a day (BID) | INTRAVENOUS | Status: DC

## 2020-02-12 MED ORDER — SODIUM CHLORIDE 0.9 % IV SOLN
80.0000 mg | Freq: Once | INTRAVENOUS | Status: DC
  Filled 2020-02-12: qty 80

## 2020-02-12 MED ORDER — PROMETHAZINE HCL 25 MG/ML IJ SOLN
25.0000 mg | Freq: Four times a day (QID) | INTRAMUSCULAR | Status: DC | PRN
  Administered 2020-02-12: 25 mg via INTRAVENOUS
  Filled 2020-02-12: qty 1

## 2020-02-12 MED ORDER — CHLORHEXIDINE GLUCONATE CLOTH 2 % EX PADS
6.0000 | MEDICATED_PAD | Freq: Every day | CUTANEOUS | Status: DC
  Administered 2020-02-12: 6 via TOPICAL

## 2020-02-12 MED ORDER — SODIUM CHLORIDE 0.9 % IV SOLN
8.0000 mg/h | INTRAVENOUS | Status: DC
  Administered 2020-02-12 – 2020-02-14 (×5): 8 mg/h via INTRAVENOUS
  Filled 2020-02-12 (×4): qty 80

## 2020-02-12 MED ORDER — GADOBUTROL 1 MMOL/ML IV SOLN
6.0000 mL | Freq: Once | INTRAVENOUS | Status: AC | PRN
  Administered 2020-02-12: 6 mL via INTRAVENOUS

## 2020-02-12 NOTE — Significant Event (Signed)
Rapid Response Event Note  Overview: Time Called: 1323 Arrival Time: 1324 Event Type: Other (Comment)  Initial Focused Assessment:  Patient is AA+Ox4. Bleeding from nose and PIV sites, vomiting blood. C/O nausea and diarrhea. During my exam, patient began to present with new slurred speech.   Interventions:  NIH was performed and was otherwise negative. [place] ordered. [place] labs ordered. IV consulted for additional PIV access. Patient transported to CT by SWOT RN.   Plan of Care (if not transferred):  Transfer to ICU 10 for continued work-up, Andexxa.   Event Summary: Name of Physician Notified: Dr. Manuella Ghazi at 1330  Name of Consulting Physician Notified: Dr. Marius Ditch, Dr. Mortimer Fries, Dr. Clayborn Bigness at    Outcome: Transferred (Comment)  Event End Time: [place]

## 2020-02-12 NOTE — Progress Notes (Signed)
Chaplain responded to Rapid Response code. Patient in distress. Chaplain offered ministry of presence, prayer as staff worked with patient. Patient transferred to ICU-10.

## 2020-02-12 NOTE — Progress Notes (Addendum)
I have started transfer process for patient to [facility] per son's request. Placed on Transfer wait list. Please have her radiology images on power chart per [facility] transfer center. Dr Amalia Greenhouse accepted patient. She doesn't think [facility] will have any room so recommends to place on [facility] wait list. I've agreed to place her on wait list there. They'll call the unit when they have bed available.

## 2020-02-12 NOTE — Consult Note (Signed)
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 Bleeding from nose and PIV sites, vomiting blood. C/O nausea and diarrhea. During my exam, patient began to present with new slurred speech.   Interventions:  NIH was performed and was otherwise negative. [place] ordered. [place] labs ordered. IV consulted for additional PIV access. Patient transported to CT by SWOT RN.   CT HEAD NO ACUTE CVA  VS stable, patient has chronic SOB, no CP    PAST MEDICAL HISTORY :   has a past medical history of A-fib ([place]), Cancer ([place]), Chronic diarrhea, Diverticulitis, GERD (gastroesophageal reflux disease), Hip fracture ([place]) (11/2015), Hypertension, IBS (irritable bowel syndrome), IBS (irritable bowel syndrome), Interstitial lung disease ([place]), Pulmonary fibrosis ([place]), Pulmonary hypertension ([place]), Seasonal affective disorder ([place]), Shortness of breath dyspnea, and Type 2 diabetes mellitus ([place]) (11/04/2014).  has a past surgical history that includes Nissen fundoplication (7494); Abdominal hysterectomy; Appendectomy; Tonsillectomy; Colonoscopy; Cataract extraction w/PHACO (Right, 03/16/2015); Open reduction internal fixation (orif) distal radial fracture (Right, 01/13/2016); Esophagogastroduodenoscopy (egd) with propofol (N/A, 05/25/2016); Colonoscopy with propofol (N/A, 05/25/2016); Cataract extraction w/PHACO (Left, 06/13/2016); and Breast biopsy (Left). Prior to Admission medications   Medication Sig Start Date End Date Taking? Authorizing Provider  acetaminophen (TYLENOL) 500 MG tablet Take 1,000 mg by mouth at bedtime.   Yes [provider]  busPIRone (BUSPAR) 7.5 MG  tablet Take 7.5 mg by mouth 2 (two) times daily. 10/22/18  Yes [provider]  Calcium Carbonate-Vitamin D (CALCIUM 600+D) 600-200 MG-UNIT TABS Take 1 tablet by mouth daily.   Yes [provider]  cephALEXin (KEFLEX) 250 MG capsule Take 250 mg by mouth daily. 01/20/20  Yes [provider]  colestipol (COLESTID) 1 g tablet Take 2 g by mouth 2 (two) times daily before a meal.    Yes [provider]  diltiazem (CARDIZEM) 30 MG tablet Take 30 mg by mouth daily as needed (prolonged palpatations).  01/20/20  Yes [provider]  diltiazem (TIAZAC) 180 MG 24 hr capsule Take 180 mg by mouth daily. 01/20/20  Yes [provider]  dronedarone (MULTAQ) 400 MG tablet Take 1 tablet (400 mg total) by mouth 2 (two) times daily with a meal. 01/10/20 02/11/20 Yes Wyvonnia Dusky, MD  ELIQUIS 5 MG TABS tablet Take 5 mg by mouth 2 (two) times daily. 10/14/19  Yes [provider]  ferrous sulfate 325 (65 FE) MG tablet Take 1 tablet (325 mg total) by mouth daily with breakfast. 01/11/20 02/11/20 Yes Wyvonnia Dusky, MD  FLUoxetine (PROZAC) 40 MG capsule Take 40 mg by mouth daily. 10/22/18  Yes [provider]  furosemide (LASIX) 20 MG tablet Take 20 mg by mouth daily.   Yes [provider]  ibandronate (BONIVA) 150 MG tablet Take 150 mg by mouth every 30 (thirty) days.  05/21/18  Yes [provider]  levothyroxine (SYNTHROID, LEVOTHROID) 75 MCG tablet Take 75 mcg by mouth daily before breakfast.    Yes [provider]  magnesium oxide (MAG-OX) 400 MG tablet Take 1 tablet by mouth daily. 01/29/20  Yes [provider]  meclizine (ANTIVERT) 25 MG tablet Take 25 mg by mouth 3 (three)  times daily as needed (vertigo).  03/27/18  Yes [provider]  Multiple Vitamin (MULTIVITAMIN WITH MINERALS) TABS tablet Take 1 tablet by mouth daily.   Yes [provider]  ondansetron (ZOFRAN) 4 MG tablet Take 1 tablet (4  mg total) by mouth daily as needed. 12/29/19 12/28/20 Yes Merlyn Lot, MD  pantoprazole (PROTONIX) 40 MG tablet Take 1 tablet (40 mg total) by mouth daily. 03/29/15  Yes Glendon Axe, MD  potassium chloride SA (KLOR-CON) 20 MEQ tablet Take 1 tablet (20 mEq total) by mouth daily. 01/10/20 02/11/20 Yes Wyvonnia Dusky, MD  predniSONE (DELTASONE) 10 MG tablet Take 10 mg by mouth daily with breakfast.   Yes [provider]  saccharomyces boulardii (FLORASTOR) 250 MG capsule Take 250 mg by mouth 2 (two) times daily.   Yes [provider]  tadalafil, PAH, (ADCIRCA) 20 MG tablet Take 40 mg by mouth at bedtime.  03/25/17  Yes [provider]  vitamin C (ASCORBIC ACID) 500 MG tablet Take 500 mg by mouth daily.   Yes [provider]  zaleplon (SONATA) 5 MG capsule Take 5 mg by mouth at bedtime as needed for sleep.  10/22/18  Yes [provider]   Allergies  Allergen Reactions  . Amlodipine Swelling  . Levofloxacin Other (See Comments)    Other reaction(s): Other (See Comments) No flouroquinolones while patient on sotalol  Other reaction(s): Other (See Comments) No flouroquinolones while patient on sotalol.  Other reaction(s): Other (See Comments) No flouroquinolones while patient on sotalol   . Nexium [Esomeprazole Magnesium] Diarrhea  . Aspartame Other (See Comments)    Patient reports "self diagnosed intolerance" to artificial sweeteners.    FAMILY HISTORY:  family history includes Atrial fibrillation in her mother; Breast cancer (age of onset: 45) in her mother; Diabetes in her maternal grandmother; Diabetes Mellitus II in her mother; Heart attack in her father and maternal grandfather; Hypothyroidism in her mother. SOCIAL HISTORY:  reports that she has never smoked. She has never used smokeless tobacco. She reports that she does not drink alcohol or use drugs.    Review of Systems:  Gen:  fatigue HEENT: Denies blurred vision, double  vision, ear pain, eye pain, hearing loss,+ nose bleeds, Cardiac:  No dizziness, chest pain or heaviness, chest tightness,edema, No JVD Resp:   No cough, -sputum production, +shortness of breath,-wheezing, -hemoptysis,  Gi: Denies swallowing difficulty, stomach pain, nausea or vomiting, diarrhea, constipation, bowel incontinence Gu:  Denies bladder incontinence, burning urine Ext:   Denies Joint pain, stiffness or swelling Skin: Denies  skin rash, easy bruising or bleeding or hives Endoc:  Denies polyuria, polydipsia , polyphagia or weight change Psych:   Denies depression, insomnia or hallucinations  Other:  All other systems negative     VITAL SIGNS: Temp:  [97.7 F (36.5 C)-98.6 F (37 C)] 97.7 F (36.5 C) (05/14 1127) Pulse Rate:  [69-154] 101 (05/14 1531) Resp:  [18-25] 25 (05/14 1531) BP: (95-158)/(50-143) 140/58 (05/14 1531) SpO2:  [91 %-99 %] 99 % (05/14 1531) Weight:  [61.1 kg-64.5 kg] 64.5 kg (05/14 0417)     SpO2: 99 % O2 Flow Rate (L/min): 3 L/min    Physical Examination:   GENERAL:NAD, no fevers, chills,+ weakness + fatigue HEAD: Normocephalic, atraumatic.  EYES: PERLA, EOMI No scleral icterus.  MOUTH: Moist mucosal membrane.  EAR, NOSE, THROAT: +epistaxis  NECK: Supple.  PULMONARY: CTA B/L no wheezing, rhonchi, crackles CARDIOVASCULAR: S1 and S2. Regular rate and rhythm. No murmurs GASTROINTESTINAL: Soft, nontender,  nondistended. Positive bowel sounds.  MUSCULOSKELETAL: No swelling, clubbing, or edema.  NEUROLOGIC: No gross focal neurological deficits. 5/5 strength all extremities SKIN: No ulceration, lesions, rashes, or cyanosis.  PSYCHIATRIC: Insight, judgment intact. -depression -anxiety ALL OTHER ROS ARE NEGATIVE   MEDICATIONS: I have reviewed all medications and confirmed regimen as documented    CULTURE RESULTS   Recent Results (from the past 240 hour(s))  SARS Coronavirus 2 by RT PCR (hospital order, performed in [facility] hospital lab)  Nasopharyngeal Nasopharyngeal Swab     Status: None   Collection Time: 02/11/20  1:19 PM   Specimen: Nasopharyngeal Swab  Result Value Ref Range Status   SARS Coronavirus 2 NEGATIVE NEGATIVE Final    Comment: (NOTE) SARS-CoV-2 target nucleic acids are NOT DETECTED. The SARS-CoV-2 RNA is generally detectable in upper and lower respiratory specimens during the acute phase of infection. The lowest concentration of SARS-CoV-2 viral copies this assay can detect is 250 copies / mL. A negative result does not preclude SARS-CoV-2 infection and should not be used as the sole basis for treatment or other patient management decisions.  A negative result may occur with improper specimen collection / handling, submission of specimen other than nasopharyngeal swab, presence of viral mutation(s) within the areas targeted by this assay, and inadequate number of viral copies (<250 copies / mL). A negative result must be combined with clinical observations, patient history, and epidemiological information. Fact Sheet for Patients:   StrictlyIdeas.no Fact Sheet for Healthcare Providers: BankingDealers.co.za This test is not yet approved or cleared  by the [place] FDA and has been authorized for detection and/or diagnosis of SARS-CoV-2 by FDA under an Emergency Use Authorization (EUA).  This EUA will remain in effect (meaning this test can be used) for the duration of the COVID-19 declaration under Section 564(b)(1) of the Act, 21 U.S.C. section 360bbb-3(b)(1), unless the authorization is terminated or revoked sooner. Performed at [facility], [address] [place], [place] [address]           IMAGING    CT HEAD WO CONTRAST  Result Date: 02/12/2020 CLINICAL DATA:  Nose bleed.  Change in speech. EXAM: CT HEAD WITHOUT CONTRAST TECHNIQUE: Contiguous axial images were obtained from the base of the skull through the vertex without  intravenous contrast. COMPARISON:  11/25/2015 FINDINGS: Brain: No acute intracranial abnormality. Specifically, no hemorrhage, hydrocephalus, mass lesion, acute infarction, or significant intracranial injury. There is atrophy and chronic small vessel disease changes. Vascular: No hyperdense vessel or unexpected calcification. Skull: No acute calvarial abnormality. Sinuses/Orbits: Visualized paranasal sinuses and mastoids clear. Orbital soft tissues unremarkable. Other: None IMPRESSION: Atrophy, chronic microvascular disease. No acute intracranial abnormality. Electronically Signed   By: Rolm Baptise M.D.   On: 02/12/2020 14:07   MR BRAIN WO CONTRAST  Result Date: 02/12/2020 CLINICAL DATA:  79 year old female with new onset confusion and slurred speech. EXAM: MRI HEAD WITHOUT CONTRAST TECHNIQUE: Multiplanar, multiecho pulse sequences of the brain and surrounding structures were obtained without intravenous contrast. COMPARISON:  Head CT earlier today. FINDINGS: Brain: No restricted diffusion to suggest acute infarction. No midline shift, mass effect, evidence of mass lesion, ventriculomegaly, extra-axial collection or acute intracranial hemorrhage. Cervicomedullary junction and pituitary are within normal limits. Cerebral volume is within normal limits for age. Widely scattered cerebral white matter nonspecific T2 and FLAIR hyperintensity, with up to moderate similar patchy signal hyperintensity in the pons. But no cortical encephalomalacia or chronic cerebral blood products identified. The deep gray nuclei and cerebellum appear  normal. Vascular: Major intracranial vascular flow voids are preserved. Skull and upper cervical spine: Negative for age visible cervical spine. Normal bone marrow signal. Sinuses/Orbits: Postoperative changes to both globes but otherwise negative orbits. Paranasal sinuses and mastoids are stable and well pneumatized. However, there are retained secretions in the right nasal cavity  tracking into the nasopharynx (series 11, image 19). Other: Grossly negative visible internal auditory structures. Degenerative left TMJ joint effusion. Negative scalp and face soft tissues. IMPRESSION: 1. No acute intracranial abnormality. 2. Moderate for age signal changes in the cerebral white matter and pons, nonspecific but most commonly due to chronic small vessel disease. 3. Retained secretions in the right nasal cavity and nasopharynx. Electronically Signed   By: Genevie Ann M.D.   On: 02/12/2020 14:52   MR ABDOMEN W WO CONTRAST  Result Date: 02/12/2020 CLINICAL DATA:  Pancreatic cyst.  Vomiting blood. EXAM: MRI ABDOMEN WITHOUT AND WITH CONTRAST TECHNIQUE: Multiplanar multisequence MR imaging of the abdomen was performed both before and after the administration of intravenous contrast. Patient was confused and difficulty remaining still during scan. CONTRAST:  2m GADAVIST GADOBUTROL 1 MMOL/ML IV SOLN COMPARISON:  MRI 09/14/2016 FINDINGS: Lower chest:  Lung bases are clear. Hepatobiliary: No focal hepatic lesion. Postcholecystectomy. The common hepatic duct is prominent at 9 mm (MRCP image 5, series 15). The common bile duct is normal caliber at 5 mm (image 12). No filling defect within common bile duct. Pancreas: There is a cystic lesion adjacent to the head of the pancreas (lesion 1) measuring 12 mm (image 19/series 8). This compares to 12 mm on comparison exam. Second smaller cystic lesion in the uncinate of the pancreas measures 10 mm (image 2/4) which is new from prior (lesion 2). This is multilobulated as seen on coronal image 1/18 18/3) The distal pancreatic duct is mildly dilated at 4 mm. There may be communication between the duct in the pancreatic head cystic lesion (lesion 1 above). Smaller new cystic lesion (lesion 2) does not clearly connected to the duct. Upstream of the pancreatic cystic lesions the pancreatic duct is nondilated. Post contrast examination of the pancreas is limited by patient  body motion. No gross abnormality Spleen: Normal spleen. Adrenals/urinary tract: Adrenal glands and kidneys are normal. Stomach/Bowel: Large hiatal hernia present. 1/3 of the stomach is above the diaphragm. Distal stomach and duodenum are normal. Limited view of the bowel is unremarkable. Vascular/Lymphatic: Abdominal aortic normal caliber. No retroperitoneal periportal lymphadenopathy. Musculoskeletal: No aggressive osseous lesion IMPRESSION: 1. Cystic lesion in the head of the pancreas which may communicate with the cystic duct is not changed significantly in size from MRI 2017. 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 2. New cystic lesion in the uncinate of the high areas does not appear to communicate with the pancreatic duct. Recommend a single follow-up MRI in 2 years for both above cystic lesions. This recommendation follows ACR consensus guidelines: Management of Incidental Pancreatic Cysts: A White Paper of the ACR Incidental Findings Committee. J Am Coll Radiol 25462;[id number] 3. Large sliding-type hiatal hernia. Electronically Signed   By: SSuzy BouchardM.D.   On: 02/12/2020 15:55   DG Chest Port 1 View  Result Date: 02/12/2020 CLINICAL DATA:  Hemoptysis EXAM: PORTABLE CHEST 1 VIEW COMPARISON:  02/11/2020 FINDINGS: Severe chronic fibrotic changes throughout the lungs. There appears to be increasing opacities and interstitial prominence within the lungs which could reflect superimposed edema or infection. Heart is borderline in size. No visible effusions or acute bony abnormality. IMPRESSION: Severe chronic  fibrotic changes throughout the lungs. Question superimposed edema or infection. Electronically Signed   By: Rolm Baptise M.D.   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 PATIENT WOULD NOT TOLERATE ANY TYPE OF  ELECTIVE SURGERY  ELECTROLYTES -follow labs as needed -replace as needed -pharmacy consultation and following    DVT/GI PRX ordered TRANSFUSIONS AS NEEDED MONITOR FSBS ASSESS the need for LABS as needed   GI PRX ordered and assessed TRANSFUSIONS AS NEEDED MONITOR FSBS I Assessed the need for Labs I Assessed the need for Foley I Assessed the need for Central Venous Line Family Discussion when available I Assessed the need for Mobilization I made an Assessment of medications to be adjusted accordingly Safety Risk assessment completed  CASE DISCUSSED IN MULTIDISCIPLINARY ROUNDS WITH ICU TEAM     Patient/Family are satisfied with Plan of action and management. All questions answered  Corrin Parker, M.D.  [facility]  Medical Director [place] Director [facility] Cardio-Pulmonary Department

## 2020-02-12 NOTE — Progress Notes (Signed)
Pt transferred to ICU 10. VSS. A&OX3, not oriented to situation. Per Dr. Mortimer Fries do not give Andexxa at this time. Pt is symmetrical in movement in all four extremities. Denies pain. Loose stool sample sent to lab. Dark brown blood noted on fingers and in mouth. Protonix drip ordered. Will continue to monitor.

## 2020-02-12 NOTE — Consult Note (Signed)
Cephas Darby, MD [address]  [place]  [place], [place] [address]  Main: [phone]  Fax: [phone] Pager: [phone]   Consultation  Referring Provider:     No ref. provider found Primary Care Physician:  Kirk Ruths, MD Primary Gastroenterologist:  Dr. Vira Agar         Reason for Consultation:  ?Hematemesis  Date of Admission:  02/11/2020 Date of Consultation:  02/12/2020         HPI:   Julie Mcmillan is a 79 y.o. female with history of paroxysmal A. fib, GERD, hypertension, pulmonary hypertension, pulmonary fibrosis who was admitted yesterday with A. fib with RVR, chest pain.  Her heart rate was in 140s.  She is on Eliquis as outpatient.  She is on diltiazem drip to control her heart rate.  This morning, patient was noticed to have bleeding from her nose, subsequently had further episodes of spitting blood, then vomiting of blood massive amounts, slurring of speech which was acute.  Therefore GI is consulted and ICU team has been consulted.  Patient is currently undergoing CT head to rule out hemorrhagic stroke in setting of anticoagulation Last dose of Eliquis at 9 AM on 5/14 Hemoglobin 11.4 yesterday afternoon Patient son who was in the room was insisting for patient to be transferred to [facility] right away.  NSAIDs: None  Antiplts/Anticoagulants/Anti thrombotics: Eliquis for history of A. fib  GI Procedures:  EGD and colonoscopy 05/25/2016 very sensitive upper airway. - The procedure was aborted due to the patient's respiratory instability (bronchospasm). - No specimens collected.  - Diverticulosis in the sigmoid colon, in the descending colon, in the transverse colon and in the ascending colon. - Internal hemorrhoids. - The examination was otherwise normal. - No specimens collected.  Past Medical History:  Diagnosis Date  . A-fib ([place])   . Cancer ([place])    skin  . Chronic diarrhea   . Diverticulitis   . GERD (gastroesophageal reflux  disease)    usually takes protonix  . Hip fracture ([place]) 11/2015   hairline fracture on right.  no surgery, just physical therapy  . Hypertension   . IBS (irritable bowel syndrome)   . IBS (irritable bowel syndrome)   . Interstitial lung disease ([place])    does not remember when  . Pulmonary fibrosis ([place])   . Pulmonary hypertension ([place])   . Seasonal affective disorder ([place])   . Shortness of breath dyspnea    with exertion  . Type 2 diabetes mellitus ([place]) 11/04/2014   pt states this has resolved with diet/exercise 11/07/18    Past Surgical History:  Procedure Laterality Date  . ABDOMINAL HYSTERECTOMY     partial  . APPENDECTOMY    . BREAST BIOPSY Left    core bx- neg  . CATARACT EXTRACTION W/PHACO Right 03/16/2015   Procedure: CATARACT EXTRACTION PHACO AND INTRAOCULAR LENS PLACEMENT (IOC);  Surgeon: Leandrew Koyanagi, MD;  Location: [place];  Service: Ophthalmology;  Laterality: Right;  . CATARACT EXTRACTION W/PHACO Left 06/13/2016   Procedure: CATARACT EXTRACTION PHACO AND INTRAOCULAR LENS PLACEMENT (IOC);  Surgeon: Leandrew Koyanagi, MD;  Location: [place];  Service: Ophthalmology;  Laterality: Left;  pre-diabetic - diet controlled  . COLONOSCOPY    . COLONOSCOPY WITH PROPOFOL N/A 05/25/2016   Procedure: COLONOSCOPY WITH PROPOFOL;  Surgeon: Manya Silvas, MD;  Location[facility]COPY;  Service: Endoscopy;  Laterality: N/A;  . ESOPHAGOGASTRODUODENOSCOPY (EGD) WITH PROPOFOL N/A 05/25/2016   Procedure: ESOPHAGOGASTRODUODENOSCOPY (EGD)  WITH PROPOFOL;  Surgeon: Manya Silvas, MD;  Location: [facility] ENDOSCOPY;  Service: Endoscopy;  Laterality: N/A;  . NISSEN FUNDOPLICATION  7517   came apart in 2016 and ibs and reflux returned  . OPEN REDUCTION INTERNAL FIXATION (ORIF) DISTAL RADIAL FRACTURE Right 01/13/2016   Procedure: OPEN REDUCTION INTERNAL FIXATION (ORIF) DISTAL RADIAL FRACTURE;  Surgeon: Hessie Knows, MD;  Location: [facility] ORS;  Service: Orthopedics;   Laterality: Right;  . TONSILLECTOMY     and adenoidectomy    Prior to Admission medications   Medication Sig Start Date End Date Taking? Authorizing Provider  acetaminophen (TYLENOL) 500 MG tablet Take 1,000 mg by mouth at bedtime.   Yes [provider]  busPIRone (BUSPAR) 7.5 MG tablet Take 7.5 mg by mouth 2 (two) times daily. 10/22/18  Yes [provider]  Calcium Carbonate-Vitamin D (CALCIUM 600+D) 600-200 MG-UNIT TABS Take 1 tablet by mouth daily.   Yes [provider]  cephALEXin (KEFLEX) 250 MG capsule Take 250 mg by mouth daily. 01/20/20  Yes [provider]  colestipol (COLESTID) 1 g tablet Take 2 g by mouth 2 (two) times daily before a meal.    Yes [provider]  diltiazem (CARDIZEM) 30 MG tablet Take 30 mg by mouth daily as needed (prolonged palpatations).  01/20/20  Yes [provider]  diltiazem (TIAZAC) 180 MG 24 hr capsule Take 180 mg by mouth daily. 01/20/20  Yes [provider]  dronedarone (MULTAQ) 400 MG tablet Take 1 tablet (400 mg total) by mouth 2 (two) times daily with a meal. 01/10/20 02/11/20 Yes Wyvonnia Dusky, MD  ELIQUIS 5 MG TABS tablet Take 5 mg by mouth 2 (two) times daily. 10/14/19  Yes [provider]  ferrous sulfate 325 (65 FE) MG tablet Take 1 tablet (325 mg total) by mouth daily with breakfast. 01/11/20 02/11/20 Yes Wyvonnia Dusky, MD  FLUoxetine (PROZAC) 40 MG capsule Take 40 mg by mouth daily. 10/22/18  Yes [provider]  furosemide (LASIX) 20 MG tablet Take 20 mg by mouth daily.   Yes [provider]  ibandronate (BONIVA) 150 MG tablet Take 150 mg by mouth every 30 (thirty) days.  05/21/18  Yes [provider]  levothyroxine (SYNTHROID, LEVOTHROID) 75 MCG tablet Take 75 mcg by mouth daily before breakfast.    Yes [provider]  magnesium oxide (MAG-OX) 400 MG tablet Take 1 tablet by mouth daily. 01/29/20  Yes [provider]  meclizine  (ANTIVERT) 25 MG tablet Take 25 mg by mouth 3 (three) times daily as needed (vertigo).  03/27/18  Yes [provider]  Multiple Vitamin (MULTIVITAMIN WITH MINERALS) TABS tablet Take 1 tablet by mouth daily.   Yes [provider]  ondansetron (ZOFRAN) 4 MG tablet Take 1 tablet (4 mg total) by mouth daily as needed. 12/29/19 12/28/20 Yes Merlyn Lot, MD  pantoprazole (PROTONIX) 40 MG tablet Take 1 tablet (40 mg total) by mouth daily. 03/29/15  Yes Glendon Axe, MD  potassium chloride SA (KLOR-CON) 20 MEQ tablet Take 1 tablet (20 mEq total) by mouth daily. 01/10/20 02/11/20 Yes Wyvonnia Dusky, MD  predniSONE (DELTASONE) 10 MG tablet Take 10 mg by mouth daily with breakfast.   Yes [provider]  saccharomyces boulardii (FLORASTOR) 250 MG capsule Take 250 mg by mouth 2 (two) times daily.   Yes [provider]  tadalafil, PAH, (ADCIRCA) 20 MG tablet Take 40 mg by mouth at bedtime.  03/25/17  Yes [provider]  vitamin C (ASCORBIC ACID) 500 MG tablet Take 500 mg by mouth daily.   Yes [provider]  zaleplon (SONATA) 5 MG capsule Take 5 mg by mouth at bedtime as needed for sleep.  10/22/18  Yes [provider]    Current Facility-Administered Medications:  .  acetaminophen (TYLENOL) tablet 1,000 mg, 1,000 mg, Oral, QHS, Manuella Ghazi, Vipul, MD, 1,000 mg at 02/11/20 2117 .  acetaminophen (TYLENOL) tablet 650 mg, 650 mg, Oral, Q4H PRN, Max Sane, MD .  ALPRAZolam Duanne Moron) tablet 0.25 mg, 0.25 mg, Oral, BID PRN, Max Sane, MD .  azaTHIOprine (IMURAN) tablet 50 mg, 50 mg, Oral, Daily, Max Sane, MD, 50 mg at 02/12/20 0912 .  calcium-vitamin D (OSCAL WITH D) 500-200 MG-UNIT per tablet 1 tablet, 1 tablet, Oral, Daily, Max Sane, MD, 1 tablet at 02/12/20 0911 .  Chlorhexidine Gluconate Cloth 2 % PADS 6 each, 6 each, Topical, Daily, Max Sane, MD, 6 each at 02/12/20 1531 .  cholestyramine (QUESTRAN) packet 4 g, 4 g, Oral, QAC breakfast,  Manuella Ghazi, Vipul, MD .  coag fact Xa recombinant (ANDEXXA) low dose infusion 900 mg, 900 mg, Intravenous, Once, Kasa, Kurian, MD .  colestipol (COLESTID) tablet 2 g, 2 g, Oral, BID, Max Sane, MD, 2 g at 02/12/20 0912 .  dronedarone (MULTAQ) tablet 400 mg, 400 mg, Oral, BID WC, Carrie Mew, MD, 400 mg at 02/12/20 1010 .  ferrous sulfate tablet 325 mg, 325 mg, Oral, Q breakfast, Max Sane, MD, 325 mg at 02/12/20 0808 .  FLUoxetine (PROZAC) capsule 40 mg, 40 mg, Oral, Daily, Max Sane, MD, 40 mg at 02/12/20 0911 .  insulin aspart (novoLOG) injection 0-9 Units, 0-9 Units, Subcutaneous, TID WC, Manuella Ghazi, Vipul, MD .  levothyroxine (SYNTHROID) tablet 75 mcg, 75 mcg, Oral, Q0600, Max Sane, MD, 75 mcg at 02/12/20 0555 .  loperamide (IMODIUM) capsule 2 mg, 2 mg, Oral, Q6H PRN, Max Sane, MD, 2 mg at 02/12/20 1312 .  magnesium oxide (MAG-OX) tablet 400 mg, 400 mg, Oral, Daily, Max Sane, MD, 400 mg at 02/12/20 0911 .  meclizine (ANTIVERT) tablet 25 mg, 25 mg, Oral, TID PRN, Max Sane, MD .  multivitamin with minerals tablet 1 tablet, 1 tablet, Oral, Daily, Max Sane, MD, 1 tablet at 02/12/20 0912 .  ondansetron (ZOFRAN) injection 4 mg, 4 mg, Intravenous, Q6H PRN, Max Sane, MD, 4 mg at 02/12/20 0911 .  pantoprazole (PROTONIX) 80 mg in sodium chloride 0.9 % 100 mL (0.8 mg/mL) infusion, 8 mg/hr, Intravenous, Continuous, Leldon Steege, Tally Due, MD .  pantoprazole (PROTONIX) 80 mg in sodium chloride 0.9 % 100 mL IVPB, 80 mg, Intravenous, Once, Jovin Fester, Tally Due, MD .  Derrill Memo ON 02/16/2020] pantoprazole (PROTONIX) injection 40 mg, 40 mg, Intravenous, Q12H, Starlyn Droge, Tally Due, MD .  potassium chloride SA (KLOR-CON) CR tablet 20 mEq, 20 mEq, Oral, Daily, Max Sane, MD, 20 mEq at 02/12/20 0912 .  promethazine (PHENERGAN) injection 25 mg, 25 mg, Intravenous, Q6H PRN, Max Sane, MD, 25 mg at 02/12/20 1312 .  tadalafil (PAH) (ADCIRCA) tablet 40 mg, 40 mg, Oral, QHS, Manuella Ghazi, Vipul, MD, 40 mg at  02/11/20 2229 .  zolpidem (AMBIEN) tablet 5 mg, 5 mg, Oral, QHS PRN, Max Sane, MD  Family History  Problem Relation Age of Onset  . Breast cancer Mother 44  . Diabetes Mellitus II Mother   . Hypothyroidism Mother   . Atrial fibrillation Mother   . Heart attack Father   . Diabetes Maternal Grandmother   . Heart attack Maternal  Grandfather      Social History   Tobacco Use  . Smoking status: Never Smoker  . Smokeless tobacco: Never Used  . Tobacco comment: no passive smokers in home  Substance Use Topics  . Alcohol use: No    Alcohol/week: 0.0 standard drinks  . Drug use: No    Allergies as of 02/11/2020 - Review Complete 02/11/2020  Allergen Reaction Noted  . Amlodipine Swelling 03/25/2015  . Levofloxacin Other (See Comments) 11/05/2017  . Nexium [esomeprazole magnesium] Diarrhea 03/10/2015  . Aspartame Other (See Comments) 10/16/2016    Review of Systems:    All systems reviewed and negative except where noted in HPI.   Physical Exam:  Vital signs in last 24 hours: Temp:  [97.7 F (36.5 C)-98.6 F (37 C)] 97.7 F (36.5 C) (05/14 1127) Pulse Rate:  [69-154] 101 (05/14 1531) Resp:  [18-25] 25 (05/14 1531) BP: (95-158)/(50-143) 140/58 (05/14 1531) SpO2:  [91 %-99 %] 99 % (05/14 1531) Weight:  [61.1 kg-64.5 kg] 64.5 kg (05/14 0417) Last BM Date: 02/11/20 General:   Pleasant, cooperative in NAD Head:  Normocephalic and atraumatic, dried blood around her nostrils. Eyes:   No icterus.   Conjunctiva pink. PERRLA. Ears:  Normal auditory acuity. Neck:  Supple; no masses or thyroidomegaly Lungs: Respirations even and unlabored. Lungs clear to auscultation bilaterally.   No wheezes, crackles, or rhonchi.  Heart: Tachycardia, irregular rhythm;  Without murmur, clicks, rubs or gallops Abdomen:  Soft, nondistended, nontender. Normal bowel sounds. No appreciable masses or hepatomegaly.  No rebound or guarding.  Rectal:  Not performed. Msk:  Symmetrical without gross  deformities.  Strength generalized weakness Extremities:  Without edema, cyanosis or clubbing. Neurologic:  Alert and oriented x3; Skin:  Intact without significant lesions or rashes. Psych:  Alert and cooperative. Normal affect.  LAB RESULTS: CBC Latest Ref Rng & Units 02/11/2020 01/13/2020 01/12/2020  WBC 4.0 - 10.5 K/uL 9.7 6.9 8.6  Hemoglobin 12.0 - 15.0 g/dL 11.4(L) 8.7(L) 9.3(L)  Hematocrit 36.0 - 46.0 % 37.3 29.2(L) 29.9(L)  Platelets 150 - 400 K/uL 195 224 259    BMET BMP Latest Ref Rng & Units 02/11/2020 01/13/2020 01/12/2020  Glucose 70 - 99 mg/dL 199(H) 109(H) 119(H)  BUN 8 - 23 mg/dL 17 23 24(H)  Creatinine 0.44 - 1.00 mg/dL 1.14(H) 0.97 1.22(H)  Sodium 135 - 145 mmol/L 136 140 138  Potassium 3.5 - 5.1 mmol/L 3.5 4.1 4.3  Chloride 98 - 111 mmol/L 105 102 102  CO2 22 - 32 mmol/L _0 Calcium 8.9 - 10.3 mg/dL 8.0(L) 8.6(L) 8.8(L)    LFT Hepatic Function Latest Ref Rng & Units 02/11/2020 12/30/2019 12/29/2019  Total Protein 6.5 - 8.1 g/dL 5.4(L) 5.9(L) 6.1(L)  Albumin 3.5 - 5.0 g/dL 3.1(L) 3.5 3.5  AST 15 - 41 U/L 74(H) 17 25  ALT 0 - 44 U/L 59(H) 17 19  Alk Phosphatase 38 - 126 U/L 110 60 66  Total Bilirubin 0.3 - 1.2 mg/dL 1.1 0.5 0.6  Bilirubin, Direct 0.1 - 0.5 mg/dL - - -     STUDIES: CT HEAD WO CONTRAST  Result Date: 02/12/2020 CLINICAL DATA:  Nose bleed.  Change in speech. EXAM: CT HEAD WITHOUT CONTRAST TECHNIQUE: Contiguous axial images were obtained from the base of the skull through the vertex without intravenous contrast. COMPARISON:  11/25/2015 FINDINGS: Brain: No acute intracranial abnormality. Specifically, no hemorrhage, hydrocephalus, mass lesion, acute infarction, or significant intracranial injury. There is atrophy and chronic small vessel disease changes. Vascular:  No hyperdense vessel or unexpected calcification. Skull: No acute calvarial abnormality. Sinuses/Orbits: Visualized paranasal sinuses and mastoids clear. Orbital soft tissues unremarkable.  Other: None IMPRESSION: Atrophy, chronic microvascular disease. No acute intracranial abnormality. Electronically Signed   By: Rolm Baptise M.D.   On: 02/12/2020 14:07   MR BRAIN WO CONTRAST  Result Date: 02/12/2020 CLINICAL DATA:  79 year old female with new onset confusion and slurred speech. EXAM: MRI HEAD WITHOUT CONTRAST TECHNIQUE: Multiplanar, multiecho pulse sequences of the brain and surrounding structures were obtained without intravenous contrast. COMPARISON:  Head CT earlier today. FINDINGS: Brain: No restricted diffusion to suggest acute infarction. No midline shift, mass effect, evidence of mass lesion, ventriculomegaly, extra-axial collection or acute intracranial hemorrhage. Cervicomedullary junction and pituitary are within normal limits. Cerebral volume is within normal limits for age. Widely scattered cerebral white matter nonspecific T2 and FLAIR hyperintensity, with up to moderate similar patchy signal hyperintensity in the pons. But no cortical encephalomalacia or chronic cerebral blood products identified. The deep gray nuclei and cerebellum appear normal. Vascular: Major intracranial vascular flow voids are preserved. Skull and upper cervical spine: Negative for age visible cervical spine. Normal bone marrow signal. Sinuses/Orbits: Postoperative changes to both globes but otherwise negative orbits. Paranasal sinuses and mastoids are stable and well pneumatized. However, there are retained secretions in the right nasal cavity tracking into the nasopharynx (series 11, image 19). Other: Grossly negative visible internal auditory structures. Degenerative left TMJ joint effusion. Negative scalp and face soft tissues. IMPRESSION: 1. No acute intracranial abnormality. 2. Moderate for age signal changes in the cerebral white matter and pons, nonspecific but most commonly due to chronic small vessel disease. 3. Retained secretions in the right nasal cavity and nasopharynx. Electronically Signed    By: Genevie Ann M.D.   On: 02/12/2020 14:52   DG Chest Port 1 View  Result Date: 02/12/2020 CLINICAL DATA:  Hemoptysis EXAM: PORTABLE CHEST 1 VIEW COMPARISON:  02/11/2020 FINDINGS: Severe chronic fibrotic changes throughout the lungs. There appears to be increasing opacities and interstitial prominence within the lungs which could reflect superimposed edema or infection. Heart is borderline in size. No visible effusions or acute bony abnormality. IMPRESSION: Severe chronic fibrotic changes throughout the lungs. Question superimposed edema or infection. Electronically Signed   By: Rolm Baptise M.D.   On: 02/12/2020 15:42   DG Chest Portable 1 View  Result Date: 02/11/2020 CLINICAL DATA:  Weakness EXAM: PORTABLE CHEST 1 VIEW COMPARISON:  01/06/2020 FINDINGS: Stable cardiomegaly. Atherosclerotic calcification of the aortic knob. Chronic fibrotic changes most pronounced within the perihilar and bibasilar regions. No definite new superimposed airspace opacity. No large pleural fluid collection. No pneumothorax. IMPRESSION: Chronic fibrotic lung changes without definite new superimposed airspace opacity. Electronically Signed   By: Davina Poke D.O.   On: 02/11/2020 14:37      Impression / Plan:   TAWANNA FUNK is a 79 y.o. female with history of pulmonary hypertension, paroxysmal A. fib on Eliquis presented with A. fib with RVR, on diltiazem drip, restarted Eliquis, 2 episodes of epistaxis, followed by vomiting of blood with sudden onset of slurring of speech.  No evidence of intracranial bleed based on CT and MRI today  ?  Hematemesis.  Most likely secondary to epistaxis and swallowed blood Patient does not have underlying cirrhosis History of moderate size hiatal hernia Recommend pantoprazole drip Strict n.p.o. Hold Eliquis for now Patient's son prefers to defer EGD at this time as long as she is hemodynamically stable and does not have  further episodes of hematemesis Monitor CBC closely,  maintain hemoglobin greater than 8 He is persistent about transferring his mom to [facility] as soon as possible  Dr. Bonna Gains will cover for the weekend  Thank you for involving me in the care of this patient.  Discussed my recommendations with Dr. Mortimer Fries    LOS: 0 days   Sherri Sear, MD  02/12/2020, 3:57 PM   Note: This dictation was prepared with Dragon dictation along with smaller phrase technology. Any transcriptional errors that result from this process are unintentional.

## 2020-02-12 NOTE — Progress Notes (Signed)
[place] [facility] [place] NAME: Julie Mcmillan    MR#:  [id number]  DATE OF BIRTH:  1941/08/03  SUBJECTIVE:  CHIEF COMPLAINT:   Chief Complaint  Patient presents with   Chest Pain  Was doing okay when I saw her earlier in the morning, heart rate did go up to 140 on minimal ambulation to bathroom, but at rest was around 90.  She had some epistaxis earlier in the morning/minor nosebleed. later in the morning she had hemoptysis/hematemesis and rapid response called due to change in mental status with some speech disturbances  REVIEW OF SYSTEMS:  Review of Systems  Constitutional: Negative for diaphoresis, fever, malaise/fatigue and weight loss.  HENT: Positive for nosebleeds. Negative for ear discharge, ear pain, hearing loss, sore throat and tinnitus.   Eyes: Negative for blurred vision and pain.  Respiratory: Negative for cough, hemoptysis, shortness of breath and wheezing.   Cardiovascular: Positive for palpitations. Negative for chest pain, orthopnea and leg swelling.  Gastrointestinal: Negative for abdominal pain, blood in stool, constipation, diarrhea, heartburn, nausea and vomiting.       Hematemesis  Genitourinary: Negative for dysuria, frequency and urgency.  Musculoskeletal: Negative for back pain and myalgias.  Skin: Negative for itching and rash.  Neurological: Negative for dizziness, tingling, tremors, focal weakness, seizures, weakness and headaches.  Psychiatric/Behavioral: Negative for depression. The patient is not nervous/anxious.    DRUG ALLERGIES:   Allergies  Allergen Reactions   Amlodipine Swelling   Levofloxacin Other (See Comments)    Other reaction(s): Other (See Comments) No flouroquinolones while patient on sotalol  Other reaction(s): Other (See Comments) No flouroquinolones while patient on sotalol.  Other reaction(s): Other (See Comments) No flouroquinolones while patient on sotalol    Nexium [Esomeprazole Magnesium]  Diarrhea   Aspartame Other (See Comments)    Patient reports "self diagnosed intolerance" to artificial sweeteners.   VITALS:  Blood pressure (!) 140/58, pulse (!) 101, temperature 97.7 F (36.5 C), temperature source Oral, resp. rate (!) 25, height _0  (1.626 m), weight 64.5 kg, SpO2 99 %. PHYSICAL EXAMINATION:  Physical Exam HENT:     Head: Normocephalic and atraumatic.  Eyes:     Conjunctiva/sclera: Conjunctivae normal.     Pupils: Pupils are equal, round, and reactive to light.  Neck:     Thyroid: No thyromegaly.     Trachea: No tracheal deviation.  Cardiovascular:     Rate and Rhythm: Normal rate and regular rhythm.     Heart sounds: Normal heart sounds.  Pulmonary:     Effort: Pulmonary effort is normal. No respiratory distress.     Breath sounds: Normal breath sounds. No wheezing.  Chest:     Chest wall: No tenderness.  Abdominal:     General: Bowel sounds are normal. There is no distension.     Palpations: Abdomen is soft.     Tenderness: There is no abdominal tenderness.  Musculoskeletal:        General: Normal range of motion.     Cervical back: Normal range of motion and neck supple.  Skin:    General: Skin is warm and dry.     Findings: No rash.  Neurological:     Mental Status: She is alert and oriented to person, place, and time.     Cranial Nerves: No cranial nerve deficit.    LABORATORY PANEL:  Female CBC Recent Labs  Lab 02/11/20 1319  WBC 9.7  HGB  11.4*  HCT 37.3  PLT 195   ------------------------------------------------------------------------------------------------------------------ Chemistries  Recent Labs  Lab 02/11/20 1319 02/11/20 1319 02/11/20 1630  NA 136  --   --   K 3.5  --   --   CL 105  --   --   CO2 23  --   --   GLUCOSE 199*  --   --   BUN 17  --   --   CREATININE 1.14*  --   --   CALCIUM 8.0*  --   --   MG 1.8   < > 1.9  AST 74*  --   --   ALT 59*  --   --   ALKPHOS 110  --   --   BILITOT 1.1  --   --    < > =  values in this interval not displayed.   RADIOLOGY:  CT HEAD WO CONTRAST  Result Date: 02/12/2020 CLINICAL DATA:  Nose bleed.  Change in speech. EXAM: CT HEAD WITHOUT CONTRAST TECHNIQUE: Contiguous axial images were obtained from the base of the skull through the vertex without intravenous contrast. COMPARISON:  11/25/2015 FINDINGS: Brain: No acute intracranial abnormality. Specifically, no hemorrhage, hydrocephalus, mass lesion, acute infarction, or significant intracranial injury. There is atrophy and chronic small vessel disease changes. Vascular: No hyperdense vessel or unexpected calcification. Skull: No acute calvarial abnormality. Sinuses/Orbits: Visualized paranasal sinuses and mastoids clear. Orbital soft tissues unremarkable. Other: None IMPRESSION: Atrophy, chronic microvascular disease. No acute intracranial abnormality. Electronically Signed   By: Rolm Baptise M.D.   On: 02/12/2020 14:07   MR BRAIN WO CONTRAST  Result Date: 02/12/2020 CLINICAL DATA:  79 year old female with new onset confusion and slurred speech. EXAM: MRI HEAD WITHOUT CONTRAST TECHNIQUE: Multiplanar, multiecho pulse sequences of the brain and surrounding structures were obtained without intravenous contrast. COMPARISON:  Head CT earlier today. FINDINGS: Brain: No restricted diffusion to suggest acute infarction. No midline shift, mass effect, evidence of mass lesion, ventriculomegaly, extra-axial collection or acute intracranial hemorrhage. Cervicomedullary junction and pituitary are within normal limits. Cerebral volume is within normal limits for age. Widely scattered cerebral white matter nonspecific T2 and FLAIR hyperintensity, with up to moderate similar patchy signal hyperintensity in the pons. But no cortical encephalomalacia or chronic cerebral blood products identified. The deep gray nuclei and cerebellum appear normal. Vascular: Major intracranial vascular flow voids are preserved. Skull and upper cervical spine:  Negative for age visible cervical spine. Normal bone marrow signal. Sinuses/Orbits: Postoperative changes to both globes but otherwise negative orbits. Paranasal sinuses and mastoids are stable and well pneumatized. However, there are retained secretions in the right nasal cavity tracking into the nasopharynx (series 11, image 19). Other: Grossly negative visible internal auditory structures. Degenerative left TMJ joint effusion. Negative scalp and face soft tissues. IMPRESSION: 1. No acute intracranial abnormality. 2. Moderate for age signal changes in the cerebral white matter and pons, nonspecific but most commonly due to chronic small vessel disease. 3. Retained secretions in the right nasal cavity and nasopharynx. Electronically Signed   By: Genevie Ann M.D.   On: 02/12/2020 14:52   DG Chest Port 1 View  Result Date: 02/12/2020 CLINICAL DATA:  Hemoptysis EXAM: PORTABLE CHEST 1 VIEW COMPARISON:  02/11/2020 FINDINGS: Severe chronic fibrotic changes throughout the lungs. There appears to be increasing opacities and interstitial prominence within the lungs which could reflect superimposed edema or infection. Heart is borderline in size. No visible effusions or acute bony abnormality. IMPRESSION: Severe chronic fibrotic changes  throughout the lungs. Question superimposed edema or infection. Electronically Signed   By: Rolm Baptise M.D.   On: 02/12/2020 15:42   ASSESSMENT AND PLAN:  79 year old female with a known history of paroxysmal atrial fibrillation, GERD, hypertension, pulmonary hypertension, pulmonary fibrosis is being admitted for rapid A. Fib  Hemoptysis/hematemesis -Could be from epistaxis.  Had a rapid response on the floor earlier -Son refusing EGD or intubation at this time per GI discussion -Dr. Marius Ditch seen and appreciate input  Abdominal pain/nausea/vomiting -Await MRI and right upper quadrant ultrasound of the abdomen  Transient speech disturbances and altered mental status - rapid  response earlier today -Negative CT head and MRI of the brain  Paroxysmal A. fib with recurrent palpitation -This is a recurrent issue for her -Cardiology/electrophysiology is Dr. Norm Salt at [facility], tadalafil - IV Lopressor 2.5 mg every 5-minute to get her heart rate under 100 as long as her blood pressure can tolerate. -She had a Holter monitor placed for last 8 days as per her EP @ [facility] -Due to her bleeding will hold Eliquis for now -Cardiology following  Type 2 diabetes mellitus without complication Continue diabetic diet, check A1c  Essential hypertension Well-controlled for now  Major depression Continue BuSpar, Prozac    Status is: Inpatient  Remains inpatient appropriate because:Ongoing diagnostic testing needed not appropriate for outpatient work up   Dispo: The patient is from: Home              Anticipated d/c is to: Home              Anticipated d/c date is: 3 days              Patient currently is not medically stable to d/c.   Son requested transfer to [facility].  I have placed transfer request. awaiting callback from [place] attending     DVT prophylaxis: None as she's bleeding Family Communication: Discussed with son at bedside   All the records are reviewed and case discussed with Care Management/Social Worker. Management plans discussed with the patient, family and they are in agreement.  CODE STATUS: Full Code  TOTAL TIME (critical care) TAKING CARE OF THIS PATIENT: 35 minutes.   More than 50% of the time was spent in counseling/coordination of care: YES  POSSIBLE D/C IN 2-3 DAYS, DEPENDING ON CLINICAL CONDITION.   Max Sane M.D on 02/12/2020 at 3:56 PM  [facility]   CC: Primary care physician; Kirk Ruths, MD  Note: This dictation was prepared with Dragon dictation along with smaller phrase technology. Any transcriptional errors that result from this process are unintentional.

## 2020-02-12 NOTE — Progress Notes (Signed)
Patient found to have had a second round of hemoptysis, and now a bloody nose. Also leaking blood around IV site. Rapid response called. See [facility] progress note. During course of assessment, patient found to have significantly slurred speech. Abbreviated NIH scale performed. Blood sugar also checked. Wenda Low [facility]

## 2020-02-12 NOTE — Plan of Care (Signed)
PMT note:  Consult noted. Patient is not in room at this time. Will reattempt [place] meeting next week as palliative medicine team is not available Saturday or Sunday.

## 2020-02-12 NOTE — Progress Notes (Signed)
Gave nurse-to-nurse report in person to ICU nurse at this time. All questions answered. Patient will go directly from testing area downstairs to the ICU. Will tube any available medications. NT has already begun to move patient belongings. Wenda Low [facility]

## 2020-02-13 LAB — CBC
HCT: 35.8 % — ABNORMAL LOW (ref 36.0–46.0)
Hemoglobin: 11.7 g/dL — ABNORMAL LOW (ref 12.0–15.0)
MCH: 27.9 pg (ref 26.0–34.0)
MCHC: 32.7 g/dL (ref 30.0–36.0)
MCV: 85.4 fL (ref 80.0–100.0)
Platelets: 187 10*3/uL (ref 150–400)
RBC: 4.19 MIL/uL (ref 3.87–5.11)
RDW: 21.2 % — ABNORMAL HIGH (ref 11.5–15.5)
WBC: 9.3 10*3/uL (ref 4.0–10.5)
nRBC: 0 % (ref 0.0–0.2)

## 2020-02-13 LAB — COMPREHENSIVE METABOLIC PANEL
ALT: 40 U/L (ref 0–44)
AST: 26 U/L (ref 15–41)
Albumin: 2.9 g/dL — ABNORMAL LOW (ref 3.5–5.0)
Alkaline Phosphatase: 123 U/L (ref 38–126)
Anion gap: 8 (ref 5–15)
BUN: 23 mg/dL (ref 8–23)
CO2: 22 mmol/L (ref 22–32)
Calcium: 8 mg/dL — ABNORMAL LOW (ref 8.9–10.3)
Chloride: 106 mmol/L (ref 98–111)
Creatinine, Ser: 0.97 mg/dL (ref 0.44–1.00)
GFR calc Af Amer: >60 mL/min (ref >60)
GFR calc non Af Amer: 55 mL/min — ABNORMAL LOW (ref >60)
Glucose, Bld: 88 mg/dL (ref 70–99)
Potassium: 4.7 mmol/L (ref 3.5–5.1)
Sodium: 136 mmol/L (ref 135–145)
Total Bilirubin: 1.3 mg/dL — ABNORMAL HIGH (ref 0.3–1.2)
Total Protein: 5.2 g/dL — ABNORMAL LOW (ref 6.5–8.1)

## 2020-02-13 LAB — GLUCOSE, CAPILLARY
Glucose-Capillary: 77 mg/dL (ref 70–99)
Glucose-Capillary: 65 mg/dL — ABNORMAL LOW (ref 70–99)
Glucose-Capillary: 117 mg/dL — ABNORMAL HIGH (ref 70–99)
Glucose-Capillary: 121 mg/dL — ABNORMAL HIGH (ref 70–99)

## 2020-02-13 LAB — PHOSPHORUS: Phosphorus: 3.6 mg/dL (ref 2.5–4.6)

## 2020-02-13 LAB — MAGNESIUM: Magnesium: 2 mg/dL (ref 1.7–2.4)

## 2020-02-13 MED ORDER — AMIODARONE HCL IN DEXTROSE 360-4.14 MG/200ML-% IV SOLN
INTRAVENOUS | Status: AC
  Administered 2020-02-14: 60 mg/h via INTRAVENOUS
  Filled 2020-02-13: qty 200

## 2020-02-13 MED ORDER — AMIODARONE HCL IN DEXTROSE 360-4.14 MG/200ML-% IV SOLN: 60.0000 mg/h | INTRAVENOUS | Status: DC

## 2020-02-13 MED ORDER — AMIODARONE HCL IN DEXTROSE 360-4.14 MG/200ML-% IV SOLN: 30.0000 mg/h | INTRAVENOUS | Status: DC

## 2020-02-13 MED ORDER — DEXTROSE 50 % IV SOLN
INTRAVENOUS | Status: AC
  Administered 2020-02-13: 50 mL via INTRAVENOUS
  Filled 2020-02-13: qty 50

## 2020-02-13 MED ORDER — CLONAZEPAM 0.5 MG PO TABS
0.5000 mg | ORAL_TABLET | Freq: Two times a day (BID) | ORAL | Status: DC
  Administered 2020-02-13 (×2): 0.5 mg via ORAL
  Filled 2020-02-13 (×2): qty 1

## 2020-02-13 MED ORDER — METOPROLOL TARTRATE 5 MG/5ML IV SOLN
INTRAVENOUS | Status: AC
  Administered 2020-02-13: 5 mg via INTRAVENOUS
  Filled 2020-02-13: qty 5

## 2020-02-13 MED ORDER — DEXTROSE 50 % IV SOLN: 1.0000 | Freq: Once | INTRAVENOUS | Status: AC

## 2020-02-13 MED ORDER — SODIUM CHLORIDE 0.9 % IV SOLN
INTRAVENOUS | Status: DC
  Administered 2020-02-14: 50 mL/h via INTRAVENOUS

## 2020-02-13 MED ORDER — METOPROLOL TARTRATE 5 MG/5ML IV SOLN: 5.0000 mg | Freq: Once | INTRAVENOUS | Status: AC

## 2020-02-13 MED ORDER — AMIODARONE IV BOLUS ONLY 150 MG/100ML
INTRAVENOUS | Status: AC
  Administered 2020-02-13: 150 mg via INTRAVENOUS
  Filled 2020-02-13: qty 100

## 2020-02-13 MED ORDER — AMIODARONE IV BOLUS ONLY 150 MG/100ML: 150.0000 mg | Freq: Once | INTRAVENOUS | Status: AC

## 2020-02-13 NOTE — Progress Notes (Signed)
Vital signs reviewed, ICU needs resolved  Will sign off at this time. No further recommendations at this time.  Please call [phone] for further questions. Thank you.    Corrin Parker, M.D.  [facility]  Medical Director Page Director [facility] Cardio-Pulmonary Department

## 2020-02-13 NOTE — Progress Notes (Signed)
Report called to [place] at [place]. Pt being transferred to [facility] room 5208. Awaiting [facility] transport to call back for report and an ETA.

## 2020-02-13 NOTE — Progress Notes (Signed)
[facility] Cardiology    SUBJECTIVE: Patient feels reasonably well visiting with her son no pain no palpitation no tachycardia no shortness of breath sitting up in bed feels better than she did yesterday she is not sure what happened still had some bleeding from her mouth yesterday and Eliquis has been held but no significant complaints today   Vitals:   02/13/20 0300 02/13/20 0400 02/13/20 0500 02/13/20 0600  BP: (!) 105/54 (!) 141/57 (!) 124/55 (!) 125/57  Pulse: 87 86 87 90  Resp: _0 Temp:      TempSrc:      SpO2: 96% 96% 97% 97%  Weight:      Height:         Intake/Output Summary (Last 24 hours) at 02/13/2020 0732 Last data filed at 02/13/2020 0500 Gross per 24 hour  Intake 123.06 ml  Output --  Net 123.06 ml      PHYSICAL EXAM  General: Well developed, well nourished, in no acute distress HEENT:  Normocephalic and atramatic Neck:  No JVD.  Lungs: Clear bilaterally to auscultation and percussion. Heart: HRRR . Normal S1 and S2 without gallops or murmurs.  Abdomen: Bowel sounds are positive, abdomen soft and non-tender  Msk:  Back normal, normal gait. Normal strength and tone for age. Extremities: No clubbing, cyanosis or edema.   Neuro: Alert and oriented X 3. Psych:  Good affect, responds appropriately   LABS: Basic Metabolic Panel: Recent Labs    02/11/20 1319 02/11/20 1630 02/12/20 1538 02/13/20 0635  NA   < >  --  135 136  K   < >  --  4.5 4.7  CL   < >  --  104 106  CO2   < >  --  20* 22  GLUCOSE   < >  --  150* 88  BUN   < >  --  20 23  CREATININE   < >  --  1.10* 0.97  CALCIUM   < >  --  8.7* 8.0*  MG  --  1.9  --  2.0  PHOS  --   --   --  3.6   < > = values in this interval not displayed.   Liver Function Tests: Recent Labs    02/11/20 1319 02/13/20 0635  AST 74* 26  ALT 59* 40  ALKPHOS 110 123  BILITOT 1.1 1.3*  PROT 5.4* 5.2*  ALBUMIN 3.1* 2.9*   Recent Labs    02/11/20 1319  LIPASE 23   CBC: Recent Labs     02/11/20 1319 02/11/20 1319 02/12/20 1538 02/13/20 0635  WBC 9.7   < > 10.5 9.3  NEUTROABS 7.4  --   --   --   HGB 11.4*   < > 13.1 11.7*  HCT 37.3   < > 41.9 35.8*  MCV 88.6   < > 87.8 85.4  PLT 195   < > 222 187   < > = values in this interval not displayed.   Cardiac Enzymes: No results for input(s): CKTOTAL, CKMB, CKMBINDEX, TROPONINI in the last 72 hours. BNP: Invalid input(s): POCBNP D-Dimer: No results for input(s): DDIMER in the last 72 hours. Hemoglobin A1C: Recent Labs    02/11/20 1630  HGBA1C 5.0   Fasting Lipid Panel: Recent Labs    02/11/20 1630  CHOL 156  HDL 31*  LDLCALC 100*  TRIG 127  CHOLHDL 5.0   Thyroid Function Tests: Recent Labs  02/11/20 1319  TSH 2.995   Anemia Panel: No results for input(s): VITAMINB12, FOLATE, FERRITIN, TIBC, IRON, RETICCTPCT in the last 72 hours.  CT HEAD WO CONTRAST  Result Date: 02/12/2020 CLINICAL DATA:  Nose bleed.  Change in speech. EXAM: CT HEAD WITHOUT CONTRAST TECHNIQUE: Contiguous axial images were obtained from the base of the skull through the vertex without intravenous contrast. COMPARISON:  11/25/2015 FINDINGS: Brain: No acute intracranial abnormality. Specifically, no hemorrhage, hydrocephalus, mass lesion, acute infarction, or significant intracranial injury. There is atrophy and chronic small vessel disease changes. Vascular: No hyperdense vessel or unexpected calcification. Skull: No acute calvarial abnormality. Sinuses/Orbits: Visualized paranasal sinuses and mastoids clear. Orbital soft tissues unremarkable. Other: None IMPRESSION: Atrophy, chronic microvascular disease. No acute intracranial abnormality. Electronically Signed   By: Rolm Baptise M.D.   On: 02/12/2020 14:07   MR BRAIN WO CONTRAST  Result Date: 02/12/2020 CLINICAL DATA:  79 year old female with new onset confusion and slurred speech. EXAM: MRI HEAD WITHOUT CONTRAST TECHNIQUE: Multiplanar, multiecho pulse sequences of the brain and  surrounding structures were obtained without intravenous contrast. COMPARISON:  Head CT earlier today. FINDINGS: Brain: No restricted diffusion to suggest acute infarction. No midline shift, mass effect, evidence of mass lesion, ventriculomegaly, extra-axial collection or acute intracranial hemorrhage. Cervicomedullary junction and pituitary are within normal limits. Cerebral volume is within normal limits for age. Widely scattered cerebral white matter nonspecific T2 and FLAIR hyperintensity, with up to moderate similar patchy signal hyperintensity in the pons. But no cortical encephalomalacia or chronic cerebral blood products identified. The deep gray nuclei and cerebellum appear normal. Vascular: Major intracranial vascular flow voids are preserved. Skull and upper cervical spine: Negative for age visible cervical spine. Normal bone marrow signal. Sinuses/Orbits: Postoperative changes to both globes but otherwise negative orbits. Paranasal sinuses and mastoids are stable and well pneumatized. However, there are retained secretions in the right nasal cavity tracking into the nasopharynx (series 11, image 19). Other: Grossly negative visible internal auditory structures. Degenerative left TMJ joint effusion. Negative scalp and face soft tissues. IMPRESSION: 1. No acute intracranial abnormality. 2. Moderate for age signal changes in the cerebral white matter and pons, nonspecific but most commonly due to chronic small vessel disease. 3. Retained secretions in the right nasal cavity and nasopharynx. Electronically Signed   By: Genevie Ann M.D.   On: 02/12/2020 14:52   MR ABDOMEN W WO CONTRAST  Result Date: 02/12/2020 CLINICAL DATA:  Pancreatic cyst.  Vomiting blood. EXAM: MRI ABDOMEN WITHOUT AND WITH CONTRAST TECHNIQUE: Multiplanar multisequence MR imaging of the abdomen was performed both before and after the administration of intravenous contrast. Patient was confused and difficulty remaining still during scan.  CONTRAST:  26m GADAVIST GADOBUTROL 1 MMOL/ML IV SOLN COMPARISON:  MRI 09/14/2016 FINDINGS: Lower chest:  Lung bases are clear. Hepatobiliary: No focal hepatic lesion. Postcholecystectomy. The common hepatic duct is prominent at 9 mm (MRCP image 5, series 15). The common bile duct is normal caliber at 5 mm (image 12). No filling defect within common bile duct. Pancreas: There is a cystic lesion adjacent to the head of the pancreas (lesion 1) measuring 12 mm (image 19/series 8). This compares to 12 mm on comparison exam. Second smaller cystic lesion in the uncinate of the pancreas measures 10 mm (image 2/4) which is new from prior (lesion 2). This is multilobulated as seen on coronal image 1/18 18/3) The distal pancreatic duct is mildly dilated at 4 mm. There may be communication between the duct in the pancreatic  head cystic lesion (lesion 1 above). Smaller new cystic lesion (lesion 2) does not clearly connected to the duct. Upstream of the pancreatic cystic lesions the pancreatic duct is nondilated. Post contrast examination of the pancreas is limited by patient body motion. No gross abnormality Spleen: Normal spleen. Adrenals/urinary tract: Adrenal glands and kidneys are normal. Stomach/Bowel: Large hiatal hernia present. 1/3 of the stomach is above the diaphragm. Distal stomach and duodenum are normal. Limited view of the bowel is unremarkable. Vascular/Lymphatic: Abdominal aortic normal caliber. No retroperitoneal periportal lymphadenopathy. Musculoskeletal: No aggressive osseous lesion IMPRESSION: 1. Cystic lesion in the head of the pancreas which may communicate with the cystic duct is not changed significantly in size from MRI 2017. 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 2. New cystic lesion in the uncinate of the high areas does not appear to communicate with the pancreatic duct. Recommend a single follow-up MRI in 2 years for both above cystic lesions. This recommendation follows ACR consensus  guidelines: Management of Incidental Pancreatic Cysts: A White Paper of the ACR Incidental Findings Committee. J Am Coll Radiol 2725;[id number]. 3. Large sliding-type hiatal hernia. Electronically Signed   By: Suzy Bouchard M.D.   On: 02/12/2020 15:55   DG Chest Port 1 View  Result Date: 02/12/2020 CLINICAL DATA:  Hemoptysis EXAM: PORTABLE CHEST 1 VIEW COMPARISON:  02/11/2020 FINDINGS: Severe chronic fibrotic changes throughout the lungs. There appears to be increasing opacities and interstitial prominence within the lungs which could reflect superimposed edema or infection. Heart is borderline in size. No visible effusions or acute bony abnormality. IMPRESSION: Severe chronic fibrotic changes throughout the lungs. Question superimposed edema or infection. Electronically Signed   By: Rolm Baptise M.D.   On: 02/12/2020 15:42   DG Chest Portable 1 View  Result Date: 02/11/2020 CLINICAL DATA:  Weakness EXAM: PORTABLE CHEST 1 VIEW COMPARISON:  01/06/2020 FINDINGS: Stable cardiomegaly. Atherosclerotic calcification of the aortic knob. Chronic fibrotic changes most pronounced within the perihilar and bibasilar regions. No definite new superimposed airspace opacity. No large pleural fluid collection. No pneumothorax. IMPRESSION: Chronic fibrotic lung changes without definite new superimposed airspace opacity. Electronically Signed   By: Davina Poke D.O.   On: 02/11/2020 14:37     Echo deferred  TELEMETRY: Sinus rhythm at around 95:  ASSESSMENT AND PLAN:  Active Problems:   Atrial fibrillation with RVR (HCC)   Atrial flutter (HCC) Possible TIA yesterday GI bleed possibly upper Tachycardia GERD . Plan Continue to hold Eliquis for now Consider further GI work-up for possible upper GI bleeding Maintain rate control with metoprolol  Multaq Recommend continue Protonix therapy Low-dose Lasix therapy should be continued Consider increased activity up out of bed to chair     Briann Sarchet D  Jackelyne Sayer,  02/13/2020 7:32 AM

## 2020-02-13 NOTE — Progress Notes (Signed)
1        [facility] [place] NAME: Julie Mcmillan    MR#:  [id number]  DATE OF BIRTH:  Dec 31, 1940  SUBJECTIVE:  CHIEF COMPLAINT:   Chief Complaint  Patient presents with  . Chest Pain  No further epistaxis or any hemoptysis/hematemesis since off Eliquis.  Heart rate well controlled at rest.  Patient seems anxious REVIEW OF SYSTEMS:  Review of Systems  Constitutional: Negative for diaphoresis, fever, malaise/fatigue and weight loss.  HENT: Negative for ear discharge, ear pain, hearing loss, sore throat and tinnitus.   Eyes: Negative for blurred vision and pain.  Respiratory: Negative for cough, hemoptysis, shortness of breath and wheezing.   Cardiovascular: Positive for palpitations. Negative for chest pain, orthopnea and leg swelling.  Gastrointestinal: Negative for abdominal pain, blood in stool, constipation, diarrhea, heartburn, nausea and vomiting.  Genitourinary: Negative for dysuria, frequency and urgency.  Musculoskeletal: Negative for back pain and myalgias.  Skin: Negative for itching and rash.  Neurological: Negative for dizziness, tingling, tremors, focal weakness, seizures, weakness and headaches.  Psychiatric/Behavioral: Negative for depression. The patient is not nervous/anxious.    DRUG ALLERGIES:   Allergies  Allergen Reactions  . Amlodipine Swelling  . Levofloxacin Other (See Comments)    Other reaction(s): Other (See Comments) No flouroquinolones while patient on sotalol  Other reaction(s): Other (See Comments) No flouroquinolones while patient on sotalol.  Other reaction(s): Other (See Comments) No flouroquinolones while patient on sotalol   . Nexium [Esomeprazole Magnesium] Diarrhea  . Aspartame Other (See Comments)    Patient reports "self diagnosed intolerance" to artificial sweeteners.   VITALS:  Blood pressure (!) 114/53, pulse 85, temperature 98.2 F (36.8 C), temperature source Oral, resp. rate 19, height _0  (1.626 m),  weight 64.5 kg, SpO2 97 %. PHYSICAL EXAMINATION:  Physical Exam HENT:     Head: Normocephalic and atraumatic.  Eyes:     Conjunctiva/sclera: Conjunctivae normal.     Pupils: Pupils are equal, round, and reactive to light.  Neck:     Thyroid: No thyromegaly.     Trachea: No tracheal deviation.  Cardiovascular:     Rate and Rhythm: Normal rate and regular rhythm.     Heart sounds: Normal heart sounds.  Pulmonary:     Effort: Pulmonary effort is normal. No respiratory distress.     Breath sounds: Normal breath sounds. No wheezing.  Chest:     Chest wall: No tenderness.  Abdominal:     General: Bowel sounds are normal. There is no distension.     Palpations: Abdomen is soft.     Tenderness: There is no abdominal tenderness.  Musculoskeletal:        General: Normal range of motion.     Cervical back: Normal range of motion and neck supple.  Skin:    General: Skin is warm and dry.     Findings: No rash.  Neurological:     Mental Status: She is alert and oriented to person, place, and time.     Cranial Nerves: No cranial nerve deficit.    LABORATORY PANEL:  Female CBC Recent Labs  Lab 02/13/20 0635  WBC 9.3  HGB 11.7*  HCT 35.8*  PLT 187   ------------------------------------------------------------------------------------------------------------------ Chemistries  Recent Labs  Lab 02/13/20 0635  NA 136  K 4.7  CL 106  CO2 22  GLUCOSE 88  BUN 23  CREATININE 0.97  CALCIUM 8.0*  MG 2.0  AST 26  ALT 40  ALKPHOS  123  BILITOT 1.3*   RADIOLOGY:  MR BRAIN WO CONTRAST  Result Date: 02/12/2020 CLINICAL DATA:  79 year old female with new onset confusion and slurred speech. EXAM: MRI HEAD WITHOUT CONTRAST TECHNIQUE: Multiplanar, multiecho pulse sequences of the brain and surrounding structures were obtained without intravenous contrast. COMPARISON:  Head CT earlier today. FINDINGS: Brain: No restricted diffusion to suggest acute infarction. No midline shift, mass  effect, evidence of mass lesion, ventriculomegaly, extra-axial collection or acute intracranial hemorrhage. Cervicomedullary junction and pituitary are within normal limits. Cerebral volume is within normal limits for age. Widely scattered cerebral white matter nonspecific T2 and FLAIR hyperintensity, with up to moderate similar patchy signal hyperintensity in the pons. But no cortical encephalomalacia or chronic cerebral blood products identified. The deep gray nuclei and cerebellum appear normal. Vascular: Major intracranial vascular flow voids are preserved. Skull and upper cervical spine: Negative for age visible cervical spine. Normal bone marrow signal. Sinuses/Orbits: Postoperative changes to both globes but otherwise negative orbits. Paranasal sinuses and mastoids are stable and well pneumatized. However, there are retained secretions in the right nasal cavity tracking into the nasopharynx (series 11, image 19). Other: Grossly negative visible internal auditory structures. Degenerative left TMJ joint effusion. Negative scalp and face soft tissues. IMPRESSION: 1. No acute intracranial abnormality. 2. Moderate for age signal changes in the cerebral white matter and pons, nonspecific but most commonly due to chronic small vessel disease. 3. Retained secretions in the right nasal cavity and nasopharynx. Electronically Signed   By: Genevie Ann M.D.   On: 02/12/2020 14:52   MR ABDOMEN W WO CONTRAST  Result Date: 02/12/2020 CLINICAL DATA:  Pancreatic cyst.  Vomiting blood. EXAM: MRI ABDOMEN WITHOUT AND WITH CONTRAST TECHNIQUE: Multiplanar multisequence MR imaging of the abdomen was performed both before and after the administration of intravenous contrast. Patient was confused and difficulty remaining still during scan. CONTRAST:  60m GADAVIST GADOBUTROL 1 MMOL/ML IV SOLN COMPARISON:  MRI 09/14/2016 FINDINGS: Lower chest:  Lung bases are clear. Hepatobiliary: No focal hepatic lesion. Postcholecystectomy. The common  hepatic duct is prominent at 9 mm (MRCP image 5, series 15). The common bile duct is normal caliber at 5 mm (image 12). No filling defect within common bile duct. Pancreas: There is a cystic lesion adjacent to the head of the pancreas (lesion 1) measuring 12 mm (image 19/series 8). This compares to 12 mm on comparison exam. Second smaller cystic lesion in the uncinate of the pancreas measures 10 mm (image 2/4) which is new from prior (lesion 2). This is multilobulated as seen on coronal image 1/18 18/3) The distal pancreatic duct is mildly dilated at 4 mm. There may be communication between the duct in the pancreatic head cystic lesion (lesion 1 above). Smaller new cystic lesion (lesion 2) does not clearly connected to the duct. Upstream of the pancreatic cystic lesions the pancreatic duct is nondilated. Post contrast examination of the pancreas is limited by patient body motion. No gross abnormality Spleen: Normal spleen. Adrenals/urinary tract: Adrenal glands and kidneys are normal. Stomach/Bowel: Large hiatal hernia present. 1/3 of the stomach is above the diaphragm. Distal stomach and duodenum are normal. Limited view of the bowel is unremarkable. Vascular/Lymphatic: Abdominal aortic normal caliber. No retroperitoneal periportal lymphadenopathy. Musculoskeletal: No aggressive osseous lesion IMPRESSION: 1. Cystic lesion in the head of the pancreas which may communicate with the cystic duct is not changed significantly in size from MRI 2017. 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 2. New cystic lesion in the uncinate of  the high areas does not appear to communicate with the pancreatic duct. Recommend a single follow-up MRI in 2 years for both above cystic lesions. This recommendation follows ACR consensus guidelines: Management of Incidental Pancreatic Cysts: A White Paper of the ACR Incidental Findings Committee. J Am Coll Radiol 4034;[id number]. 3. Large sliding-type hiatal hernia. Electronically Signed    By: Suzy Bouchard M.D.   On: 02/12/2020 15:55   DG Chest Port 1 View  Result Date: 02/12/2020 CLINICAL DATA:  Hemoptysis EXAM: PORTABLE CHEST 1 VIEW COMPARISON:  02/11/2020 FINDINGS: Severe chronic fibrotic changes throughout the lungs. There appears to be increasing opacities and interstitial prominence within the lungs which could reflect superimposed edema or infection. Heart is borderline in size. No visible effusions or acute bony abnormality. IMPRESSION: Severe chronic fibrotic changes throughout the lungs. Question superimposed edema or infection. Electronically Signed   By: Rolm Baptise M.D.   On: 02/12/2020 15:42   ASSESSMENT AND PLAN:  79 year old female with a known history of paroxysmal atrial fibrillation, GERD, hypertension, pulmonary hypertension, pulmonary fibrosis is being admitted for rapid A. Fib  Hemoptysis/hematemesis -Could be from epistaxis.  Had a rapid response on the floor earlier -Son refusing EGD or intubation at this time per GI discussion -Dr. Marius Ditch seen and appreciate input  Abdominal pain/nausea/vomiting -MRI abdomen not showing any acute pathology  Transient speech disturbances and altered mental status - rapid response on 5/14 -Negative CT head and MRI of the brain -This was likely due to panic attack/anxiety  Paroxysmal A. fib with recurrent palpitation -This is a recurrent issue for her -Cardiology/electrophysiology is Dr. Norm Salt at [facility], tadalafil - IV Lopressor 2.5 mg every 5-minute to get her heart rate under 100 as long as her blood pressure can tolerate. -She had a Holter monitor placed for last 8 days as per her EP @ [facility] -Due to her bleeding will hold Eliquis for now -Cardiology following  Type 2 diabetes mellitus without complication Continue diabetic diet,   Essential hypertension Well-controlled for now  Major depression/anxiety Continue BuSpar, Prozac -I will stop Xanax and add Klonopin for now as  scheduled  She can be transferred to progressive cardiac care unit with discharge tomorrow  Status is: Inpatient  Remains inpatient appropriate because:Ongoing diagnostic testing needed not appropriate for outpatient work up   Dispo: The patient is from: Home              Anticipated d/c is to: Home              Anticipated d/c date is: 1 day              Patient currently is not medically stable to d/c.   Son requested transfer to [facility].  I have placed transfer request. awaiting callback from [place] attending     DVT prophylaxis: None as she's bleeding Family Communication: Discussed with son over phone   All the records are reviewed and case discussed with Care Management/Social Worker. Management plans discussed with the patient, family and they are in agreement.  CODE STATUS: Full Code  TOTAL TIME TAKING CARE OF THIS PATIENT: 35 minutes.   More than 50% of the time was spent in counseling/coordination of care: YES  POSSIBLE D/C IN 1 DAYS, DEPENDING ON CLINICAL CONDITION.   Max Sane M.D on 02/13/2020 at 2:26 PM  [facility]   CC: Primary care physician; Kirk Ruths, MD  Note: This dictation was prepared with Dragon dictation along with smaller phrase  technology. Any transcriptional errors that result from this process are unintentional.

## 2020-02-13 NOTE — Discharge Summary (Signed)
[facility] [place] NAME: Julie Mcmillan    MR#:  [id number]  DATE OF BIRTH:  [birth year]  DATE OF ADMISSION:  02/11/2020   ADMITTING PHYSICIAN: Max Sane, MD  DATE OF DISCHARGE: 02/13/2020  PRIMARY CARE PHYSICIAN: Kirk Ruths, MD   ADMISSION DIAGNOSIS:  Atrial fibrillation with rapid ventricular response (HCC) [I48.91] Atrial fibrillation with RVR (HCC) [I48.91] Atrial flutter ([place]) [I48.92] DISCHARGE DIAGNOSIS:  Active Problems:   Atrial fibrillation with RVR (HCC)   Atrial flutter ([place])  SECONDARY DIAGNOSIS:   Past Medical History:  Diagnosis Date  . A-fib ([place])   . Cancer ([place])    skin  . Chronic diarrhea   . Diverticulitis   . GERD (gastroesophageal reflux disease)    usually takes protonix  . Hip fracture ([place]) 11/2015   hairline fracture on right.  no surgery, just physical therapy  . Hypertension   . IBS (irritable bowel syndrome)   . IBS (irritable bowel syndrome)   . Interstitial lung disease ([place])    does not remember when  . Pulmonary fibrosis ([place])   . Pulmonary hypertension ([place])   . Seasonal affective disorder ([place])   . Shortness of breath dyspnea    with exertion  . Type 2 diabetes mellitus ([place]) 11/04/2014   pt states this has resolved with diet/exercise 11/07/18   HOSPITAL COURSE:  79 year old female with a known history of paroxysmalatrial fibrillation, GERD, hypertension, pulmonary hypertension, pulmonary fibrosis is being admitted for rapid A. Fib  Hemoptysis/hematemesis -Could be from epistaxis.  Had a rapid response on the floor on 5/14 -Son refusing EGD or intubation at this time per GI discussion.  May be agreeable at [facility] if needed and desired  Abdominal pain/nausea/vomiting -MRI abdomen not showing any acute pathology  Transient speech disturbances and altered mental status - rapid response on 5/14 -Negative CT head and MRI of the brain -This was likely due to panic  attack/anxiety  Paroxysmal A. fib with recurrent palpitation -This is a recurrent issue for her -Cardiology/electrophysiology is Dr. Norm Salt at [facility], tadalafil - IV Lopressor 2.5 mg every 5-minute to get her heart rate under 100 as long as her blood pressure can tolerate.-She had a Holter monitor placed for last 8 daysas perher EP_0  -Due to her bleeding will hold Eliquis for now.  This can be resumed if her cardiologist in agreement.  Type 2 diabetes mellitus without complication Continue diabetic diet   Essential hypertension Well-controlled for now  Major depression/anxiety Continue BuSpar, Prozac  DISCHARGE CONDITIONS:  Stable CONSULTS OBTAINED:  Treatment Team:  Lin Landsman, MD DRUG ALLERGIES:   Allergies  Allergen Reactions  . Amlodipine Swelling  . Levofloxacin Other (See Comments)    Other reaction(s): Other (See Comments) No flouroquinolones while patient on sotalol  Other reaction(s): Other (See Comments) No flouroquinolones while patient on sotalol.  Other reaction(s): Other (See Comments) No flouroquinolones while patient on sotalol   . Nexium [Esomeprazole Magnesium] Diarrhea  . Aspartame Other (See Comments)    Patient reports "self diagnosed intolerance" to artificial sweeteners.   DISCHARGE MEDICATIONS:   Allergies as of 02/13/2020      Reactions   Amlodipine Swelling   Levofloxacin Other (See Comments)   Other reaction(s): Other (See Comments) No flouroquinolones while patient on sotalol  Other reaction(s): Other (See Comments) No flouroquinolones while patient on sotalol.  Other reaction(s): Other (See Comments) No flouroquinolones while patient on sotalol  Nexium [esomeprazole Magnesium] Diarrhea   Aspartame Other (See Comments)   Patient reports "self diagnosed intolerance" to artificial sweeteners.      Medication List    STOP taking these medications   cephALEXin 250 MG capsule Commonly known  as: KEFLEX   Eliquis 5 MG Tabs tablet Generic drug: apixaban     TAKE these medications   acetaminophen 500 MG tablet Commonly known as: TYLENOL Take 1,000 mg by mouth at bedtime.   Adcirca 20 MG tablet Generic drug: tadalafil (PAH) Take 40 mg by mouth at bedtime.   busPIRone 7.5 MG tablet Commonly known as: BUSPAR Take 7.5 mg by mouth 2 (two) times daily.   Calcium 600+D 600-200 MG-UNIT Tabs Generic drug: Calcium Carbonate-Vitamin D Take 1 tablet by mouth daily.   colestipol 1 g tablet Commonly known as: COLESTID Take 2 g by mouth 2 (two) times daily before a meal.   diltiazem 180 MG 24 hr capsule Commonly known as: TIAZAC Take 180 mg by mouth daily.   diltiazem 30 MG tablet Commonly known as: CARDIZEM Take 30 mg by mouth daily as needed (prolonged palpatations).   dronedarone 400 MG tablet Commonly known as: MULTAQ Take 1 tablet (400 mg total) by mouth 2 (two) times daily with a meal.   ferrous sulfate 325 (65 FE) MG tablet Take 1 tablet (325 mg total) by mouth daily with breakfast.   FLUoxetine 40 MG capsule Commonly known as: PROZAC Take 40 mg by mouth daily.   furosemide 20 MG tablet Commonly known as: LASIX Take 20 mg by mouth daily.   ibandronate 150 MG tablet Commonly known as: BONIVA Take 150 mg by mouth every 30 (thirty) days.   levothyroxine 75 MCG tablet Commonly known as: SYNTHROID Take 75 mcg by mouth daily before breakfast.   magnesium oxide 400 MG tablet Commonly known as: MAG-OX Take 1 tablet by mouth daily.   meclizine 25 MG tablet Commonly known as: ANTIVERT Take 25 mg by mouth 3 (three) times daily as needed (vertigo).   multivitamin with minerals Tabs tablet Take 1 tablet by mouth daily.   ondansetron 4 MG tablet Commonly known as: Zofran Take 1 tablet (4 mg total) by mouth daily as needed.   pantoprazole 40 MG tablet Commonly known as: PROTONIX Take 1 tablet (40 mg total) by mouth daily.   potassium chloride SA 20 MEQ  tablet Commonly known as: KLOR-CON Take 1 tablet (20 mEq total) by mouth daily.   predniSONE 10 MG tablet Commonly known as: DELTASONE Take 10 mg by mouth daily with breakfast.   saccharomyces boulardii 250 MG capsule Commonly known as: FLORASTOR Take 250 mg by mouth 2 (two) times daily.   vitamin C 500 MG tablet Commonly known as: ASCORBIC ACID Take 500 mg by mouth daily.   zaleplon 5 MG capsule Commonly known as: SONATA Take 5 mg by mouth at bedtime as needed for sleep.      DISCHARGE INSTRUCTIONS:   DIET:  Cardiac diet DISCHARGE CONDITION:  Stable ACTIVITY:  Activity as tolerated OXYGEN:  Home Oxygen: No.  Oxygen Delivery: room air DISCHARGE LOCATION:  [facility]  If you experience worsening of your admission symptoms, develop shortness of breath, life threatening emergency, suicidal or homicidal thoughts you must seek medical attention immediately by calling 911 or calling your MD immediately  if symptoms less severe.  You Must read complete instructions/literature along with all the possible adverse reactions/side effects for all the Medicines you take and that have been prescribed to you.  Take any new Medicines after you have completely understood and accpet all the possible adverse reactions/side effects.   Please note  You were cared for by a hospitalist during your hospital stay. If you have any questions about your discharge medications or the care you received while you were in the hospital after you are discharged, you can call the unit and asked to speak with the hospitalist on call if the hospitalist that took care of you is not available. Once you are discharged, your primary care physician will handle any further medical issues. Please note that NO REFILLS for any discharge medications will be authorized once you are discharged, as it is imperative that you return to your primary care physician (or establish a relationship with a primary care physician if you do  not have one) for your aftercare needs so that they can reassess your need for medications and monitor your lab values.    On the day of Discharge:  VITAL SIGNS:  Blood pressure (!) 114/53, pulse 85, temperature 98.2 F (36.8 C), temperature source Oral, resp. rate 19, height _0  (1.626 m), weight 64.5 kg, SpO2 97 %. PHYSICAL EXAMINATION:  GENERAL:  79 y.o.-year-old patient lying in the bed with no acute distress.  EYES: Pupils equal, round, reactive to light and accommodation. No scleral icterus. Extraocular muscles intact.  HEENT: Head atraumatic, normocephalic. Oropharynx and nasopharynx clear.  NECK:  Supple, no jugular venous distention. No thyroid enlargement, no tenderness.  LUNGS: Normal breath sounds bilaterally, no wheezing, rales,rhonchi or crepitation. No use of accessory muscles of respiration.  CARDIOVASCULAR: S1, S2 normal. No murmurs, rubs, or gallops.  ABDOMEN: Soft, non-tender, non-distended. Bowel sounds present. No organomegaly or mass.  EXTREMITIES: No pedal edema, cyanosis, or clubbing.  NEUROLOGIC: Cranial nerves II through XII are intact. Muscle strength 5/5 in all extremities. Sensation intact. Gait not checked.  PSYCHIATRIC: The patient is alert and oriented x 3.  SKIN: No obvious rash, lesion, or ulcer.  DATA REVIEW:   CBC Recent Labs  Lab 02/13/20 0635  WBC 9.3  HGB 11.7*  HCT 35.8*  PLT 187    Chemistries  Recent Labs  Lab 02/13/20 0635  NA 136  K 4.7  CL 106  CO2 22  GLUCOSE 88  BUN 23  CREATININE 0.97  CALCIUM 8.0*  MG 2.0  AST 26  ALT 40  ALKPHOS 123  BILITOT 1.3*     Outpatient follow-up    Management plans discussed with the patient, family and they are in agreement.  CODE STATUS: Full Code   TOTAL TIME TAKING CARE OF THIS PATIENT: 45 minutes.    Max Sane M.D on 02/13/2020 at 3:07 PM  [facility]   CC: Primary care physician; Kirk Ruths, MD   Note: This dictation was prepared with Dragon  dictation along with smaller phrase technology. Any transcriptional errors that result from this process are unintentional.

## 2020-02-14 NOTE — Progress Notes (Signed)
Patients Son Deon Ivey updated that [facility] has picked up patient and is being transferred.  Nyela Cortinas was given room number [phone] and phone number [phone] to contact new unit.

## 2020-02-14 NOTE — Progress Notes (Signed)
Report Called to [employer].M RN at [facility].   I informed RN of the possibility of the patient having to go on a amiodarone GTTs.  The RN confirmed that the floor could accept amiodarone GTTS.

## 2020-02-14 NOTE — Progress Notes (Signed)
Patient  No longer in Afib verbal order from Coralyn Mark NP to Stop Amiodarone GTTS.

## 2020-02-14 NOTE — Progress Notes (Signed)
Patient went into Afib with RVR and started on Amiodarone gtt after the bolus. HR stable

## 2020-02-14 NOTE — Progress Notes (Addendum)
Patient complaining of no pain Patient Alert and oriented 2100 Patient went into Afib RVR HR 150s 80m of metoprolol  2145 Amiodarone bolus given 2315 HR 95  2345 Afib RVR HR 150s 0000 Amiodarone GTTS started 0015 HR 90s  Patient alert and oriented  resting comfortably in bed

## 2020-03-31 DEATH — deceased
